# Patient Record
Sex: Female | Born: 1940 | Race: White | Hispanic: No | Smoking: Never smoker
Health system: Southern US, Community
[De-identification: ages and names within clinical notes are randomized; demographics above are authoritative.]

## PROBLEM LIST (undated history)

## (undated) DIAGNOSIS — C719 Malignant neoplasm of brain, unspecified: Secondary | ICD-10-CM

## (undated) DIAGNOSIS — M549 Dorsalgia, unspecified: Secondary | ICD-10-CM

## (undated) DIAGNOSIS — C801 Malignant (primary) neoplasm, unspecified: Secondary | ICD-10-CM

## (undated) DIAGNOSIS — Z85118 Personal history of other malignant neoplasm of bronchus and lung: Secondary | ICD-10-CM

## (undated) DIAGNOSIS — M1990 Unspecified osteoarthritis, unspecified site: Secondary | ICD-10-CM

## (undated) DIAGNOSIS — C3490 Malignant neoplasm of unspecified part of unspecified bronchus or lung: Secondary | ICD-10-CM

## (undated) DIAGNOSIS — R222 Localized swelling, mass and lump, trunk: Secondary | ICD-10-CM

## (undated) DIAGNOSIS — F411 Generalized anxiety disorder: Secondary | ICD-10-CM

## (undated) DIAGNOSIS — F329 Major depressive disorder, single episode, unspecified: Secondary | ICD-10-CM

## (undated) DIAGNOSIS — M810 Age-related osteoporosis without current pathological fracture: Secondary | ICD-10-CM

## (undated) DIAGNOSIS — G629 Polyneuropathy, unspecified: Secondary | ICD-10-CM

## (undated) DIAGNOSIS — G4700 Insomnia, unspecified: Secondary | ICD-10-CM

## (undated) DIAGNOSIS — K5909 Other constipation: Secondary | ICD-10-CM

## (undated) DIAGNOSIS — N390 Urinary tract infection, site not specified: Secondary | ICD-10-CM

## (undated) DIAGNOSIS — R5381 Other malaise: Secondary | ICD-10-CM

## (undated) DIAGNOSIS — I10 Essential (primary) hypertension: Secondary | ICD-10-CM

## (undated) DIAGNOSIS — R5383 Other fatigue: Secondary | ICD-10-CM

## (undated) DIAGNOSIS — E785 Hyperlipidemia, unspecified: Secondary | ICD-10-CM

## (undated) DIAGNOSIS — J309 Allergic rhinitis, unspecified: Secondary | ICD-10-CM

## (undated) HISTORY — DX: Other malaise: R53.81

## (undated) HISTORY — DX: Age-related osteoporosis without current pathological fracture: M81.0

## (undated) HISTORY — DX: Hyperlipidemia, unspecified: E78.5

## (undated) HISTORY — DX: Insomnia, unspecified: G47.00

## (undated) HISTORY — DX: Other fatigue: R53.83

## (undated) HISTORY — DX: Dorsalgia, unspecified: M54.9

## (undated) HISTORY — DX: Malignant neoplasm of brain, unspecified: C71.9

## (undated) HISTORY — DX: Generalized anxiety disorder: F41.1

## (undated) HISTORY — DX: Essential (primary) hypertension: I10

## (undated) HISTORY — DX: Malignant neoplasm of unspecified part of unspecified bronchus or lung: C34.90

## (undated) HISTORY — DX: Allergic rhinitis, unspecified: J30.9

## (undated) HISTORY — DX: Localized swelling, mass and lump, trunk: R22.2

## (undated) HISTORY — DX: Other constipation: K59.09

## (undated) HISTORY — PX: TONSILLECTOMY: SUR1361

## (undated) HISTORY — PX: LOBECTOMY: SHX5089

## (undated) HISTORY — PX: EYE SURGERY: SHX253

## (undated) HISTORY — DX: Urinary tract infection, site not specified: N39.0

## (undated) HISTORY — PX: TUBAL LIGATION: SHX77

## (undated) HISTORY — DX: Personal history of other malignant neoplasm of bronchus and lung: Z85.118

## (undated) HISTORY — DX: Major depressive disorder, single episode, unspecified: F32.9

## (undated) HISTORY — DX: Polyneuropathy, unspecified: G62.9

## (undated) HISTORY — PX: APPENDECTOMY: SHX54

---

## 1998-07-11 ENCOUNTER — Emergency Department (HOSPITAL_COMMUNITY): Admission: EM | Admit: 1998-07-11 | Discharge: 1998-07-11 | Payer: Self-pay | Admitting: Emergency Medicine

## 1998-11-17 ENCOUNTER — Other Ambulatory Visit: Admission: RE | Admit: 1998-11-17 | Discharge: 1998-11-17 | Payer: Self-pay | Admitting: *Deleted

## 1999-07-18 ENCOUNTER — Encounter: Admission: RE | Admit: 1999-07-18 | Discharge: 1999-09-07 | Payer: Self-pay | Admitting: Unknown Physician Specialty

## 1999-10-25 ENCOUNTER — Other Ambulatory Visit: Admission: RE | Admit: 1999-10-25 | Discharge: 1999-10-25 | Payer: Self-pay | Admitting: *Deleted

## 2000-10-22 ENCOUNTER — Other Ambulatory Visit: Admission: RE | Admit: 2000-10-22 | Discharge: 2000-10-22 | Payer: Self-pay | Admitting: *Deleted

## 2003-06-15 ENCOUNTER — Other Ambulatory Visit: Admission: RE | Admit: 2003-06-15 | Discharge: 2003-06-15 | Payer: Self-pay | Admitting: Internal Medicine

## 2004-06-27 ENCOUNTER — Ambulatory Visit: Payer: Self-pay | Admitting: Family Medicine

## 2004-07-07 ENCOUNTER — Ambulatory Visit: Payer: Self-pay | Admitting: *Deleted

## 2004-07-13 ENCOUNTER — Ambulatory Visit (HOSPITAL_COMMUNITY): Admission: RE | Admit: 2004-07-13 | Discharge: 2004-07-13 | Payer: Self-pay | Admitting: Family Medicine

## 2004-07-26 ENCOUNTER — Ambulatory Visit: Payer: Self-pay | Admitting: Family Medicine

## 2004-08-16 ENCOUNTER — Ambulatory Visit: Payer: Self-pay | Admitting: Family Medicine

## 2004-08-22 ENCOUNTER — Ambulatory Visit: Payer: Self-pay | Admitting: Family Medicine

## 2004-08-31 ENCOUNTER — Ambulatory Visit: Payer: Self-pay | Admitting: Family Medicine

## 2004-09-05 ENCOUNTER — Ambulatory Visit: Payer: Self-pay | Admitting: Family Medicine

## 2004-10-12 ENCOUNTER — Ambulatory Visit: Payer: Self-pay | Admitting: Family Medicine

## 2004-10-13 ENCOUNTER — Ambulatory Visit (HOSPITAL_COMMUNITY): Admission: RE | Admit: 2004-10-13 | Discharge: 2004-10-13 | Payer: Self-pay | Admitting: Family Medicine

## 2004-11-11 ENCOUNTER — Ambulatory Visit: Payer: Self-pay | Admitting: Family Medicine

## 2004-11-16 ENCOUNTER — Ambulatory Visit (HOSPITAL_COMMUNITY): Admission: RE | Admit: 2004-11-16 | Discharge: 2004-11-16 | Payer: Self-pay | Admitting: Family Medicine

## 2004-12-06 ENCOUNTER — Ambulatory Visit: Payer: Self-pay | Admitting: Family Medicine

## 2005-07-11 ENCOUNTER — Ambulatory Visit: Payer: Self-pay | Admitting: Family Medicine

## 2005-08-03 ENCOUNTER — Ambulatory Visit: Payer: Self-pay | Admitting: Nurse Practitioner

## 2005-08-30 ENCOUNTER — Ambulatory Visit (HOSPITAL_COMMUNITY): Admission: RE | Admit: 2005-08-30 | Discharge: 2005-08-30 | Payer: Self-pay | Admitting: Family Medicine

## 2006-02-14 ENCOUNTER — Ambulatory Visit: Payer: Self-pay | Admitting: Internal Medicine

## 2006-02-19 ENCOUNTER — Ambulatory Visit: Payer: Self-pay | Admitting: Internal Medicine

## 2006-04-11 ENCOUNTER — Ambulatory Visit: Payer: Self-pay | Admitting: Internal Medicine

## 2006-08-31 ENCOUNTER — Ambulatory Visit (HOSPITAL_COMMUNITY): Admission: RE | Admit: 2006-08-31 | Discharge: 2006-08-31 | Payer: Self-pay | Admitting: Internal Medicine

## 2006-09-11 ENCOUNTER — Encounter: Admission: RE | Admit: 2006-09-11 | Discharge: 2006-09-11 | Payer: Self-pay | Admitting: Internal Medicine

## 2007-03-12 ENCOUNTER — Ambulatory Visit: Payer: Self-pay | Admitting: Internal Medicine

## 2007-03-12 LAB — CONVERTED CEMR LAB
Alkaline Phosphatase: 51 units/L (ref 39–117)
BUN: 15 mg/dL (ref 6–23)
Basophils Absolute: 0 10*3/uL (ref 0.0–0.1)
Bilirubin Urine: NEGATIVE
CO2: 32 meq/L (ref 19–32)
Cholesterol: 235 mg/dL (ref 0–200)
Crystals: NEGATIVE
Eosinophils Absolute: 0.1 10*3/uL (ref 0.0–0.6)
GFR calc Af Amer: 93 mL/min
HDL: 62.2 mg/dL (ref >39.0)
Hemoglobin, Urine: NEGATIVE
Hemoglobin: 13.7 g/dL (ref 12.0–15.0)
Lymphocytes Relative: 42.7 % (ref 12.0–46.0)
MCHC: 34.8 g/dL (ref 30.0–36.0)
Monocytes Absolute: 0.3 10*3/uL (ref 0.2–0.7)
Monocytes Relative: 7 % (ref 3.0–11.0)
Neutro Abs: 2.5 10*3/uL (ref 1.4–7.7)
Potassium: 3.5 meq/L (ref 3.5–5.1)
TSH: 1.26 microintl units/mL (ref 0.35–5.50)
Total Protein: 6.3 g/dL (ref 6.0–8.3)
Triglycerides: 114 mg/dL (ref 0–149)
Urine Glucose: NEGATIVE mg/dL
VLDL: 23 mg/dL (ref 0–40)

## 2007-03-18 ENCOUNTER — Ambulatory Visit: Payer: Self-pay | Admitting: Internal Medicine

## 2007-04-17 ENCOUNTER — Ambulatory Visit: Payer: Self-pay | Admitting: Internal Medicine

## 2007-05-02 ENCOUNTER — Encounter: Payer: Self-pay | Admitting: Internal Medicine

## 2007-05-02 ENCOUNTER — Ambulatory Visit: Payer: Self-pay | Admitting: Internal Medicine

## 2007-08-27 ENCOUNTER — Other Ambulatory Visit: Admission: RE | Admit: 2007-08-27 | Discharge: 2007-08-27 | Payer: Self-pay | Admitting: Gynecology

## 2007-09-16 ENCOUNTER — Ambulatory Visit (HOSPITAL_COMMUNITY): Admission: RE | Admit: 2007-09-16 | Discharge: 2007-09-16 | Payer: Self-pay | Admitting: Internal Medicine

## 2007-10-29 ENCOUNTER — Observation Stay (HOSPITAL_COMMUNITY): Admission: EM | Admit: 2007-10-29 | Discharge: 2007-10-30 | Payer: Self-pay | Admitting: Emergency Medicine

## 2007-10-29 ENCOUNTER — Ambulatory Visit: Payer: Self-pay | Admitting: Internal Medicine

## 2007-10-30 ENCOUNTER — Encounter: Payer: Self-pay | Admitting: Internal Medicine

## 2007-11-04 ENCOUNTER — Emergency Department (HOSPITAL_COMMUNITY): Admission: EM | Admit: 2007-11-04 | Discharge: 2007-11-05 | Payer: Self-pay | Admitting: Emergency Medicine

## 2007-11-05 ENCOUNTER — Ambulatory Visit: Payer: Self-pay | Admitting: Internal Medicine

## 2007-11-05 ENCOUNTER — Telehealth (INDEPENDENT_AMBULATORY_CARE_PROVIDER_SITE_OTHER): Payer: Self-pay | Admitting: *Deleted

## 2007-11-05 DIAGNOSIS — E785 Hyperlipidemia, unspecified: Secondary | ICD-10-CM

## 2007-11-05 DIAGNOSIS — I10 Essential (primary) hypertension: Secondary | ICD-10-CM

## 2007-11-05 DIAGNOSIS — M810 Age-related osteoporosis without current pathological fracture: Secondary | ICD-10-CM

## 2007-11-05 DIAGNOSIS — J309 Allergic rhinitis, unspecified: Secondary | ICD-10-CM | POA: Insufficient documentation

## 2007-11-05 DIAGNOSIS — R222 Localized swelling, mass and lump, trunk: Secondary | ICD-10-CM

## 2007-11-05 HISTORY — DX: Hyperlipidemia, unspecified: E78.5

## 2007-11-05 HISTORY — DX: Essential (primary) hypertension: I10

## 2007-11-05 HISTORY — DX: Localized swelling, mass and lump, trunk: R22.2

## 2007-11-05 HISTORY — DX: Age-related osteoporosis without current pathological fracture: M81.0

## 2007-11-05 HISTORY — DX: Allergic rhinitis, unspecified: J30.9

## 2007-11-07 ENCOUNTER — Ambulatory Visit: Payer: Self-pay | Admitting: Critical Care Medicine

## 2007-11-08 ENCOUNTER — Ambulatory Visit: Payer: Self-pay | Admitting: Critical Care Medicine

## 2007-11-12 ENCOUNTER — Encounter: Payer: Self-pay | Admitting: Critical Care Medicine

## 2007-11-13 ENCOUNTER — Encounter: Payer: Self-pay | Admitting: Critical Care Medicine

## 2007-11-13 ENCOUNTER — Ambulatory Visit: Admission: RE | Admit: 2007-11-13 | Discharge: 2007-11-13 | Payer: Self-pay | Admitting: Critical Care Medicine

## 2007-11-13 ENCOUNTER — Ambulatory Visit: Payer: Self-pay | Admitting: Critical Care Medicine

## 2007-11-14 ENCOUNTER — Telehealth (INDEPENDENT_AMBULATORY_CARE_PROVIDER_SITE_OTHER): Payer: Self-pay | Admitting: *Deleted

## 2007-11-15 ENCOUNTER — Encounter: Payer: Self-pay | Admitting: Critical Care Medicine

## 2007-11-15 ENCOUNTER — Telehealth: Payer: Self-pay | Admitting: Critical Care Medicine

## 2007-11-15 ENCOUNTER — Ambulatory Visit (HOSPITAL_COMMUNITY): Admission: RE | Admit: 2007-11-15 | Discharge: 2007-11-15 | Payer: Self-pay | Admitting: Critical Care Medicine

## 2007-11-18 ENCOUNTER — Telehealth: Payer: Self-pay | Admitting: Critical Care Medicine

## 2007-11-18 ENCOUNTER — Telehealth (INDEPENDENT_AMBULATORY_CARE_PROVIDER_SITE_OTHER): Payer: Self-pay | Admitting: *Deleted

## 2007-11-22 ENCOUNTER — Ambulatory Visit: Payer: Self-pay | Admitting: Thoracic Surgery (Cardiothoracic Vascular Surgery)

## 2007-11-22 ENCOUNTER — Telehealth (INDEPENDENT_AMBULATORY_CARE_PROVIDER_SITE_OTHER): Payer: Self-pay | Admitting: *Deleted

## 2007-11-22 ENCOUNTER — Encounter: Payer: Self-pay | Admitting: Internal Medicine

## 2007-11-29 ENCOUNTER — Ambulatory Visit: Payer: Self-pay

## 2007-11-29 ENCOUNTER — Encounter: Payer: Self-pay | Admitting: Internal Medicine

## 2007-12-01 HISTORY — PX: OTHER SURGICAL HISTORY: SHX169

## 2007-12-03 ENCOUNTER — Encounter: Payer: Self-pay | Admitting: Critical Care Medicine

## 2007-12-09 ENCOUNTER — Ambulatory Visit: Payer: Self-pay | Admitting: Thoracic Surgery (Cardiothoracic Vascular Surgery)

## 2007-12-10 ENCOUNTER — Ambulatory Visit: Payer: Self-pay | Admitting: Thoracic Surgery (Cardiothoracic Vascular Surgery)

## 2007-12-10 ENCOUNTER — Encounter: Payer: Self-pay | Admitting: Thoracic Surgery (Cardiothoracic Vascular Surgery)

## 2007-12-10 ENCOUNTER — Inpatient Hospital Stay (HOSPITAL_COMMUNITY)
Admission: RE | Admit: 2007-12-10 | Discharge: 2007-12-16 | Payer: Self-pay | Admitting: Thoracic Surgery (Cardiothoracic Vascular Surgery)

## 2007-12-10 ENCOUNTER — Encounter: Payer: Self-pay | Admitting: Critical Care Medicine

## 2007-12-16 ENCOUNTER — Ambulatory Visit: Payer: Self-pay | Admitting: Internal Medicine

## 2007-12-19 ENCOUNTER — Telehealth (INDEPENDENT_AMBULATORY_CARE_PROVIDER_SITE_OTHER): Payer: Self-pay | Admitting: *Deleted

## 2007-12-22 ENCOUNTER — Emergency Department (HOSPITAL_COMMUNITY): Admission: EM | Admit: 2007-12-22 | Discharge: 2007-12-22 | Payer: Self-pay | Admitting: Emergency Medicine

## 2007-12-26 ENCOUNTER — Encounter: Admission: RE | Admit: 2007-12-26 | Discharge: 2007-12-26 | Payer: Self-pay | Admitting: Cardiothoracic Surgery

## 2007-12-26 ENCOUNTER — Ambulatory Visit: Payer: Self-pay | Admitting: Cardiothoracic Surgery

## 2008-01-02 ENCOUNTER — Encounter: Payer: Self-pay | Admitting: Internal Medicine

## 2008-01-02 ENCOUNTER — Encounter: Payer: Self-pay | Admitting: Critical Care Medicine

## 2008-01-02 LAB — COMPREHENSIVE METABOLIC PANEL
ALT: 30 U/L (ref 0–35)
AST: 18 U/L (ref 0–37)
Alkaline Phosphatase: 48 U/L (ref 39–117)
Calcium: 9.7 mg/dL (ref 8.4–10.5)
Chloride: 98 mEq/L (ref 96–112)
Creatinine, Ser: 0.79 mg/dL (ref 0.40–1.20)
Total Bilirubin: 0.5 mg/dL (ref 0.3–1.2)

## 2008-01-02 LAB — CBC WITH DIFFERENTIAL/PLATELET
BASO%: 0.7 % (ref 0.0–2.0)
Basophils Absolute: 0 10*3/uL (ref 0.0–0.1)
EOS%: 1.3 % (ref 0.0–7.0)
HCT: 34.3 % — ABNORMAL LOW (ref 34.8–46.6)
HGB: 12 g/dL (ref 11.6–15.9)
MCH: 31.6 pg (ref 26.0–34.0)
MCHC: 34.9 g/dL (ref 32.0–36.0)
MCV: 90.6 fL (ref 81.0–101.0)
MONO%: 8.2 % (ref 0.0–13.0)
NEUT%: 53.2 % (ref 39.6–76.8)

## 2008-01-21 ENCOUNTER — Encounter: Payer: Self-pay | Admitting: Internal Medicine

## 2008-01-21 ENCOUNTER — Ambulatory Visit: Payer: Self-pay | Admitting: Thoracic Surgery (Cardiothoracic Vascular Surgery)

## 2008-01-21 LAB — CBC WITH DIFFERENTIAL/PLATELET
BASO%: 0.8 % (ref 0.0–2.0)
Basophils Absolute: 0.1 10*3/uL (ref 0.0–0.1)
EOS%: 0.3 % (ref 0.0–7.0)
HCT: 36.3 % (ref 34.8–46.6)
HGB: 12.7 g/dL (ref 11.6–15.9)
LYMPH%: 19.8 % (ref 14.0–48.0)
MCH: 31.7 pg (ref 26.0–34.0)
MCHC: 35.1 g/dL (ref 32.0–36.0)
MCV: 90.2 fL (ref 81.0–101.0)
MONO%: 5.7 % (ref 0.0–13.0)
NEUT%: 73.4 % (ref 39.6–76.8)
Platelets: 276 10*3/uL (ref 145–400)

## 2008-01-21 LAB — MAGNESIUM: Magnesium: 2.4 mg/dL (ref 1.5–2.5)

## 2008-01-21 LAB — COMPREHENSIVE METABOLIC PANEL
ALT: 14 U/L (ref 0–35)
AST: 12 U/L (ref 0–37)
BUN: 18 mg/dL (ref 6–23)
Creatinine, Ser: 0.68 mg/dL (ref 0.40–1.20)
Total Bilirubin: 0.3 mg/dL (ref 0.3–1.2)

## 2008-01-24 ENCOUNTER — Ambulatory Visit: Payer: Self-pay | Admitting: Internal Medicine

## 2008-01-28 LAB — CBC WITH DIFFERENTIAL/PLATELET
Basophils Absolute: 0 10*3/uL (ref 0.0–0.1)
EOS%: 0.6 % (ref 0.0–7.0)
Eosinophils Absolute: 0 10*3/uL (ref 0.0–0.5)
HCT: 38.1 % (ref 34.8–46.6)
HGB: 13.4 g/dL (ref 11.6–15.9)
MCH: 31.3 pg (ref 26.0–34.0)
MCV: 89.2 fL (ref 81.0–101.0)
NEUT#: 1.3 10*3/uL — ABNORMAL LOW (ref 1.5–6.5)
NEUT%: 27.8 % — ABNORMAL LOW (ref 39.6–76.8)
RDW: 11.6 % (ref 11.3–14.5)
lymph#: 2.3 10*3/uL (ref 0.9–3.3)

## 2008-01-28 LAB — COMPREHENSIVE METABOLIC PANEL
AST: 13 U/L (ref 0–37)
Albumin: 4.6 g/dL (ref 3.5–5.2)
BUN: 15 mg/dL (ref 6–23)
Calcium: 9.9 mg/dL (ref 8.4–10.5)
Chloride: 93 mEq/L — ABNORMAL LOW (ref 96–112)
Creatinine, Ser: 0.69 mg/dL (ref 0.40–1.20)
Glucose, Bld: 103 mg/dL — ABNORMAL HIGH (ref 70–99)
Potassium: 3.5 mEq/L (ref 3.5–5.3)

## 2008-01-28 LAB — MAGNESIUM: Magnesium: 1.7 mg/dL (ref 1.5–2.5)

## 2008-02-04 LAB — COMPREHENSIVE METABOLIC PANEL
ALT: 32 U/L (ref 0–35)
AST: 19 U/L (ref 0–37)
Alkaline Phosphatase: 60 U/L (ref 39–117)
Creatinine, Ser: 0.72 mg/dL (ref 0.40–1.20)
Sodium: 141 mEq/L (ref 135–145)
Total Bilirubin: 0.2 mg/dL — ABNORMAL LOW (ref 0.3–1.2)
Total Protein: 6.1 g/dL (ref 6.0–8.3)

## 2008-02-04 LAB — CBC WITH DIFFERENTIAL/PLATELET
BASO%: 0.2 % (ref 0.0–2.0)
EOS%: 0 % (ref 0.0–7.0)
HCT: 34.1 % — ABNORMAL LOW (ref 34.8–46.6)
LYMPH%: 30.4 % (ref 14.0–48.0)
MCH: 31.4 pg (ref 26.0–34.0)
MCHC: 34.8 g/dL (ref 32.0–36.0)
MONO%: 4.9 % (ref 0.0–13.0)
NEUT%: 64.5 % (ref 39.6–76.8)
Platelets: 247 10*3/uL (ref 145–400)
RBC: 3.78 10*6/uL (ref 3.70–5.32)

## 2008-02-04 LAB — MAGNESIUM: Magnesium: 2.3 mg/dL (ref 1.5–2.5)

## 2008-02-11 ENCOUNTER — Encounter: Payer: Self-pay | Admitting: Critical Care Medicine

## 2008-02-11 ENCOUNTER — Encounter: Payer: Self-pay | Admitting: Internal Medicine

## 2008-02-11 LAB — CBC WITH DIFFERENTIAL/PLATELET
BASO%: 0.5 % (ref 0.0–2.0)
EOS%: 0.3 % (ref 0.0–7.0)
MCH: 31.5 pg (ref 26.0–34.0)
MCHC: 35 g/dL (ref 32.0–36.0)
MONO#: 0.2 10*3/uL (ref 0.1–0.9)
NEUT%: 83.7 % — ABNORMAL HIGH (ref 39.6–76.8)
RBC: 3.66 10*6/uL — ABNORMAL LOW (ref 3.70–5.32)
WBC: 8.2 10*3/uL (ref 3.9–10.0)
lymph#: 1.1 10*3/uL (ref 0.9–3.3)

## 2008-02-11 LAB — COMPREHENSIVE METABOLIC PANEL
ALT: 24 U/L (ref 0–35)
AST: 18 U/L (ref 0–37)
CO2: 24 mEq/L (ref 19–32)
Calcium: 9.1 mg/dL (ref 8.4–10.5)
Chloride: 103 mEq/L (ref 96–112)
Creatinine, Ser: 0.68 mg/dL (ref 0.40–1.20)
Sodium: 140 mEq/L (ref 135–145)
Total Bilirubin: 0.3 mg/dL (ref 0.3–1.2)
Total Protein: 6.6 g/dL (ref 6.0–8.3)

## 2008-02-11 LAB — MAGNESIUM: Magnesium: 2 mg/dL (ref 1.5–2.5)

## 2008-02-17 ENCOUNTER — Ambulatory Visit (HOSPITAL_COMMUNITY): Admission: RE | Admit: 2008-02-17 | Discharge: 2008-02-17 | Payer: Self-pay | Admitting: Internal Medicine

## 2008-03-03 ENCOUNTER — Encounter: Payer: Self-pay | Admitting: Internal Medicine

## 2008-03-03 LAB — COMPREHENSIVE METABOLIC PANEL
ALT: 26 U/L (ref 0–35)
AST: 19 U/L (ref 0–37)
Alkaline Phosphatase: 48 U/L (ref 39–117)
BUN: 14 mg/dL (ref 6–23)
CO2: 25 mEq/L (ref 19–32)
Chloride: 103 mEq/L (ref 96–112)
Creatinine, Ser: 0.7 mg/dL (ref 0.40–1.20)
Glucose, Bld: 165 mg/dL — ABNORMAL HIGH (ref 70–99)
Potassium: 4.1 mEq/L (ref 3.5–5.3)
Sodium: 140 mEq/L (ref 135–145)
Total Bilirubin: 0.5 mg/dL (ref 0.3–1.2)

## 2008-03-03 LAB — CBC WITH DIFFERENTIAL/PLATELET
Eosinophils Absolute: 0 10*3/uL (ref 0.0–0.5)
LYMPH%: 4.5 % — ABNORMAL LOW (ref 14.0–48.0)
MONO#: 0 10*3/uL — ABNORMAL LOW (ref 0.1–0.9)
NEUT#: 9.5 10*3/uL — ABNORMAL HIGH (ref 1.5–6.5)
Platelets: 288 10*3/uL (ref 145–400)
RBC: 3.7 10*6/uL (ref 3.70–5.32)
RDW: 14.1 % (ref 11.3–14.5)
WBC: 10 10*3/uL (ref 3.9–10.0)
lymph#: 0.5 10*3/uL — ABNORMAL LOW (ref 0.9–3.3)

## 2008-03-06 ENCOUNTER — Ambulatory Visit: Payer: Self-pay | Admitting: Internal Medicine

## 2008-03-10 LAB — CBC WITH DIFFERENTIAL/PLATELET
BASO%: 0.4 % (ref 0.0–2.0)
EOS%: 0.4 % (ref 0.0–7.0)
HCT: 33.3 % — ABNORMAL LOW (ref 34.8–46.6)
LYMPH%: 42.5 % (ref 14.0–48.0)
MCH: 31.3 pg (ref 26.0–34.0)
MCHC: 34.7 g/dL (ref 32.0–36.0)
MONO#: 0.5 10*3/uL (ref 0.1–0.9)
NEUT%: 44.9 % (ref 39.6–76.8)
Platelets: 147 10*3/uL (ref 145–400)
RBC: 3.7 10*6/uL (ref 3.70–5.32)
WBC: 4.4 10*3/uL (ref 3.9–10.0)

## 2008-03-10 LAB — COMPREHENSIVE METABOLIC PANEL
ALT: 15 U/L (ref 0–35)
AST: 10 U/L (ref 0–37)
Alkaline Phosphatase: 68 U/L (ref 39–117)
Creatinine, Ser: 0.7 mg/dL (ref 0.40–1.20)
Sodium: 137 mEq/L (ref 135–145)
Total Bilirubin: 0.6 mg/dL (ref 0.3–1.2)
Total Protein: 6.3 g/dL (ref 6.0–8.3)

## 2008-03-17 LAB — CBC WITH DIFFERENTIAL/PLATELET
BASO%: 0.3 % (ref 0.0–2.0)
Basophils Absolute: 0 10*3/uL (ref 0.0–0.1)
EOS%: 0 % (ref 0.0–7.0)
HCT: 32 % — ABNORMAL LOW (ref 34.8–46.6)
HGB: 11.3 g/dL — ABNORMAL LOW (ref 11.6–15.9)
LYMPH%: 24.6 % (ref 14.0–48.0)
MCH: 31.9 pg (ref 26.0–34.0)
MCHC: 35.4 g/dL (ref 32.0–36.0)
MCV: 90 fL (ref 81.0–101.0)
MONO%: 4.8 % (ref 0.0–13.0)
NEUT%: 70.3 % (ref 39.6–76.8)
Platelets: 209 10*3/uL (ref 145–400)
lymph#: 2.3 10*3/uL (ref 0.9–3.3)

## 2008-03-17 LAB — COMPREHENSIVE METABOLIC PANEL
AST: 15 U/L (ref 0–37)
BUN: 15 mg/dL (ref 6–23)
Calcium: 9.1 mg/dL (ref 8.4–10.5)
Chloride: 100 mEq/L (ref 96–112)
Creatinine, Ser: 0.72 mg/dL (ref 0.40–1.20)
Total Bilirubin: 0.3 mg/dL (ref 0.3–1.2)

## 2008-03-17 LAB — MAGNESIUM: Magnesium: 2 mg/dL (ref 1.5–2.5)

## 2008-03-24 ENCOUNTER — Encounter: Payer: Self-pay | Admitting: Internal Medicine

## 2008-03-24 ENCOUNTER — Encounter: Payer: Self-pay | Admitting: Critical Care Medicine

## 2008-03-27 ENCOUNTER — Telehealth (INDEPENDENT_AMBULATORY_CARE_PROVIDER_SITE_OTHER): Payer: Self-pay | Admitting: *Deleted

## 2008-03-31 LAB — COMPREHENSIVE METABOLIC PANEL
ALT: 16 U/L (ref 0–35)
Albumin: 4.4 g/dL (ref 3.5–5.2)
Alkaline Phosphatase: 70 U/L (ref 39–117)
CO2: 25 mEq/L (ref 19–32)
Glucose, Bld: 108 mg/dL — ABNORMAL HIGH (ref 70–99)
Potassium: 4 mEq/L (ref 3.5–5.3)
Sodium: 137 mEq/L (ref 135–145)
Total Bilirubin: 0.6 mg/dL (ref 0.3–1.2)
Total Protein: 6.3 g/dL (ref 6.0–8.3)

## 2008-03-31 LAB — CBC WITH DIFFERENTIAL/PLATELET
BASO%: 0 % (ref 0.0–2.0)
Eosinophils Absolute: 0 10*3/uL (ref 0.0–0.5)
LYMPH%: 41.2 % (ref 14.0–48.0)
MCHC: 35.5 g/dL (ref 32.0–36.0)
MONO#: 0.3 10*3/uL (ref 0.1–0.9)
MONO%: 7.7 % (ref 0.0–13.0)
NEUT#: 2.1 10*3/uL (ref 1.5–6.5)
Platelets: 171 10*3/uL (ref 145–400)
RBC: 3.58 10*6/uL — ABNORMAL LOW (ref 3.70–5.32)
RDW: 16.4 % — ABNORMAL HIGH (ref 11.3–14.5)
WBC: 4.2 10*3/uL (ref 3.9–10.0)

## 2008-04-10 ENCOUNTER — Ambulatory Visit: Payer: Self-pay | Admitting: Thoracic Surgery (Cardiothoracic Vascular Surgery)

## 2008-04-10 ENCOUNTER — Encounter
Admission: RE | Admit: 2008-04-10 | Discharge: 2008-04-10 | Payer: Self-pay | Admitting: Thoracic Surgery (Cardiothoracic Vascular Surgery)

## 2008-04-20 ENCOUNTER — Encounter: Admission: RE | Admit: 2008-04-20 | Discharge: 2008-04-20 | Payer: Self-pay | Admitting: Internal Medicine

## 2008-04-21 ENCOUNTER — Ambulatory Visit: Payer: Self-pay | Admitting: Internal Medicine

## 2008-04-23 ENCOUNTER — Encounter: Payer: Self-pay | Admitting: Critical Care Medicine

## 2008-04-23 LAB — CBC WITH DIFFERENTIAL/PLATELET
Basophils Absolute: 0 10*3/uL (ref 0.0–0.1)
Eosinophils Absolute: 0 10*3/uL (ref 0.0–0.5)
HGB: 10.6 g/dL — ABNORMAL LOW (ref 11.6–15.9)
LYMPH%: 26.4 % (ref 14.0–48.0)
MCV: 94.7 fL (ref 81.0–101.0)
MONO%: 8.5 % (ref 0.0–13.0)
NEUT#: 3.1 10*3/uL (ref 1.5–6.5)
Platelets: 292 10*3/uL (ref 145–400)
RDW: 16.5 % — ABNORMAL HIGH (ref 11.3–14.5)

## 2008-04-23 LAB — COMPREHENSIVE METABOLIC PANEL
Albumin: 4.3 g/dL (ref 3.5–5.2)
Alkaline Phosphatase: 47 U/L (ref 39–117)
BUN: 12 mg/dL (ref 6–23)
Glucose, Bld: 99 mg/dL (ref 70–99)
Potassium: 4.1 mEq/L (ref 3.5–5.3)

## 2008-04-23 LAB — MAGNESIUM: Magnesium: 1.9 mg/dL (ref 1.5–2.5)

## 2008-05-04 ENCOUNTER — Ambulatory Visit: Payer: Self-pay | Admitting: Internal Medicine

## 2008-05-05 LAB — CONVERTED CEMR LAB
AST: 21 units/L (ref 0–37)
Albumin: 3.7 g/dL (ref 3.5–5.2)
BUN: 14 mg/dL (ref 6–23)
Basophils Relative: 1.8 % (ref 0.0–3.0)
Cholesterol: 183 mg/dL (ref 0–200)
Creatinine, Ser: 0.7 mg/dL (ref 0.4–1.2)
Crystals: NEGATIVE
Eosinophils Absolute: 0.1 10*3/uL (ref 0.0–0.7)
Eosinophils Relative: 1.8 % (ref 0.0–5.0)
GFR calc Af Amer: 108 mL/min
GFR calc non Af Amer: 90 mL/min
Glucose, Bld: 83 mg/dL (ref 70–99)
HCT: 32.1 % — ABNORMAL LOW (ref 36.0–46.0)
Hemoglobin: 10.9 g/dL — ABNORMAL LOW (ref 12.0–15.0)
MCV: 99.7 fL (ref 78.0–100.0)
Monocytes Absolute: 0.4 10*3/uL (ref 0.1–1.0)
Neutro Abs: 1.8 10*3/uL (ref 1.4–7.7)
RBC: 3.22 M/uL — ABNORMAL LOW (ref 3.87–5.11)
Specific Gravity, Urine: 1.015 (ref 1.000–1.03)
TSH: 0.7 microintl units/mL (ref 0.35–5.50)
Total Protein, Urine: NEGATIVE mg/dL
Total Protein: 5.7 g/dL — ABNORMAL LOW (ref 6.0–8.3)
Urine Glucose: NEGATIVE mg/dL
VLDL: 25 mg/dL (ref 0–40)
WBC: 4.1 10*3/uL — ABNORMAL LOW (ref 4.5–10.5)

## 2008-05-06 ENCOUNTER — Ambulatory Visit: Payer: Self-pay | Admitting: Internal Medicine

## 2008-05-06 ENCOUNTER — Telehealth (INDEPENDENT_AMBULATORY_CARE_PROVIDER_SITE_OTHER): Payer: Self-pay | Admitting: *Deleted

## 2008-05-06 DIAGNOSIS — Z85118 Personal history of other malignant neoplasm of bronchus and lung: Secondary | ICD-10-CM

## 2008-05-06 DIAGNOSIS — N390 Urinary tract infection, site not specified: Secondary | ICD-10-CM

## 2008-05-06 DIAGNOSIS — F329 Major depressive disorder, single episode, unspecified: Secondary | ICD-10-CM

## 2008-05-06 DIAGNOSIS — F3289 Other specified depressive episodes: Secondary | ICD-10-CM

## 2008-05-06 HISTORY — DX: Major depressive disorder, single episode, unspecified: F32.9

## 2008-05-06 HISTORY — DX: Personal history of other malignant neoplasm of bronchus and lung: Z85.118

## 2008-05-06 HISTORY — DX: Other specified depressive episodes: F32.89

## 2008-05-06 HISTORY — DX: Urinary tract infection, site not specified: N39.0

## 2008-05-06 LAB — CONVERTED CEMR LAB
Ketones, urine, test strip: NEGATIVE
Nitrite: NEGATIVE
Specific Gravity, Urine: 1.015

## 2008-05-11 ENCOUNTER — Telehealth (INDEPENDENT_AMBULATORY_CARE_PROVIDER_SITE_OTHER): Payer: Self-pay | Admitting: *Deleted

## 2008-05-11 ENCOUNTER — Ambulatory Visit: Payer: Self-pay | Admitting: Internal Medicine

## 2008-05-11 DIAGNOSIS — M549 Dorsalgia, unspecified: Secondary | ICD-10-CM

## 2008-05-11 HISTORY — DX: Dorsalgia, unspecified: M54.9

## 2008-05-12 ENCOUNTER — Ambulatory Visit: Payer: Self-pay | Admitting: Internal Medicine

## 2008-05-13 ENCOUNTER — Telehealth (INDEPENDENT_AMBULATORY_CARE_PROVIDER_SITE_OTHER): Payer: Self-pay | Admitting: *Deleted

## 2008-05-18 ENCOUNTER — Telehealth (INDEPENDENT_AMBULATORY_CARE_PROVIDER_SITE_OTHER): Payer: Self-pay | Admitting: *Deleted

## 2008-05-20 ENCOUNTER — Ambulatory Visit: Payer: Self-pay | Admitting: Internal Medicine

## 2008-05-20 ENCOUNTER — Encounter: Payer: Self-pay | Admitting: Internal Medicine

## 2008-05-25 ENCOUNTER — Telehealth (INDEPENDENT_AMBULATORY_CARE_PROVIDER_SITE_OTHER): Payer: Self-pay | Admitting: *Deleted

## 2008-05-29 ENCOUNTER — Encounter: Payer: Self-pay | Admitting: Internal Medicine

## 2008-06-01 ENCOUNTER — Encounter: Payer: Self-pay | Admitting: Internal Medicine

## 2008-06-01 ENCOUNTER — Telehealth (INDEPENDENT_AMBULATORY_CARE_PROVIDER_SITE_OTHER): Payer: Self-pay | Admitting: *Deleted

## 2008-06-17 ENCOUNTER — Encounter: Admission: RE | Admit: 2008-06-17 | Discharge: 2008-06-17 | Payer: Self-pay | Admitting: Family Medicine

## 2008-06-19 ENCOUNTER — Encounter: Payer: Self-pay | Admitting: Internal Medicine

## 2008-06-26 ENCOUNTER — Emergency Department (HOSPITAL_COMMUNITY): Admission: EM | Admit: 2008-06-26 | Discharge: 2008-06-27 | Payer: Self-pay | Admitting: Emergency Medicine

## 2008-07-06 ENCOUNTER — Encounter: Payer: Self-pay | Admitting: Internal Medicine

## 2008-07-07 ENCOUNTER — Encounter: Payer: Self-pay | Admitting: Internal Medicine

## 2008-07-09 ENCOUNTER — Encounter: Payer: Self-pay | Admitting: Internal Medicine

## 2008-07-13 ENCOUNTER — Telehealth (INDEPENDENT_AMBULATORY_CARE_PROVIDER_SITE_OTHER): Payer: Self-pay | Admitting: *Deleted

## 2008-07-15 ENCOUNTER — Telehealth (INDEPENDENT_AMBULATORY_CARE_PROVIDER_SITE_OTHER): Payer: Self-pay | Admitting: *Deleted

## 2008-07-17 ENCOUNTER — Ambulatory Visit: Payer: Self-pay | Admitting: Internal Medicine

## 2008-07-20 ENCOUNTER — Encounter: Payer: Self-pay | Admitting: Internal Medicine

## 2008-07-21 LAB — COMPREHENSIVE METABOLIC PANEL
ALT: 15 U/L (ref 0–35)
AST: 13 U/L (ref 0–37)
Albumin: 4.5 g/dL (ref 3.5–5.2)
Calcium: 9.7 mg/dL (ref 8.4–10.5)
Chloride: 101 mEq/L (ref 96–112)
Creatinine, Ser: 0.85 mg/dL (ref 0.40–1.20)
Potassium: 3.8 mEq/L (ref 3.5–5.3)

## 2008-07-21 LAB — CBC WITH DIFFERENTIAL/PLATELET
BASO%: 0.7 % (ref 0.0–2.0)
EOS%: 0.9 % (ref 0.0–7.0)
MCH: 32.3 pg (ref 26.0–34.0)
MCHC: 34.8 g/dL (ref 32.0–36.0)
RDW: 12.1 % (ref 11.3–14.5)
lymph#: 2.3 10*3/uL (ref 0.9–3.3)

## 2008-07-28 ENCOUNTER — Telehealth (INDEPENDENT_AMBULATORY_CARE_PROVIDER_SITE_OTHER): Payer: Self-pay | Admitting: *Deleted

## 2008-07-28 ENCOUNTER — Encounter: Payer: Self-pay | Admitting: Internal Medicine

## 2008-08-18 ENCOUNTER — Encounter: Payer: Self-pay | Admitting: Internal Medicine

## 2008-08-31 ENCOUNTER — Encounter
Admission: RE | Admit: 2008-08-31 | Discharge: 2008-08-31 | Payer: Self-pay | Admitting: Thoracic Surgery (Cardiothoracic Vascular Surgery)

## 2008-08-31 ENCOUNTER — Ambulatory Visit: Payer: Self-pay | Admitting: Thoracic Surgery (Cardiothoracic Vascular Surgery)

## 2008-09-16 ENCOUNTER — Ambulatory Visit (HOSPITAL_COMMUNITY): Admission: RE | Admit: 2008-09-16 | Discharge: 2008-09-16 | Payer: Self-pay | Admitting: Internal Medicine

## 2008-09-17 ENCOUNTER — Telehealth (INDEPENDENT_AMBULATORY_CARE_PROVIDER_SITE_OTHER): Payer: Self-pay | Admitting: *Deleted

## 2008-09-22 ENCOUNTER — Telehealth (INDEPENDENT_AMBULATORY_CARE_PROVIDER_SITE_OTHER): Payer: Self-pay | Admitting: *Deleted

## 2008-09-28 ENCOUNTER — Encounter: Payer: Self-pay | Admitting: Internal Medicine

## 2008-09-29 ENCOUNTER — Encounter: Admission: RE | Admit: 2008-09-29 | Discharge: 2008-09-29 | Payer: Self-pay | Admitting: Internal Medicine

## 2008-10-15 ENCOUNTER — Telehealth: Payer: Self-pay | Admitting: Internal Medicine

## 2008-10-20 ENCOUNTER — Ambulatory Visit: Payer: Self-pay | Admitting: Internal Medicine

## 2008-10-22 ENCOUNTER — Ambulatory Visit: Payer: Self-pay | Admitting: Internal Medicine

## 2008-10-22 LAB — COMPREHENSIVE METABOLIC PANEL
ALT: 17 U/L (ref 0–35)
AST: 16 U/L (ref 0–37)
Albumin: 4.8 g/dL (ref 3.5–5.2)
Alkaline Phosphatase: 64 U/L (ref 39–117)
BUN: 16 mg/dL (ref 6–23)
CO2: 29 mEq/L (ref 19–32)
Calcium: 9.9 mg/dL (ref 8.4–10.5)
Chloride: 100 mEq/L (ref 96–112)
Creatinine, Ser: 1 mg/dL (ref 0.40–1.20)
Glucose, Bld: 85 mg/dL (ref 70–99)
Potassium: 3.9 mEq/L (ref 3.5–5.3)
Sodium: 140 mEq/L (ref 135–145)
Total Bilirubin: 0.4 mg/dL (ref 0.3–1.2)
Total Protein: 7.2 g/dL (ref 6.0–8.3)

## 2008-10-22 LAB — CBC WITH DIFFERENTIAL/PLATELET
BASO%: 0.4 % (ref 0.0–2.0)
Basophils Absolute: 0 10*3/uL (ref 0.0–0.1)
EOS%: 0.9 % (ref 0.0–7.0)
HGB: 13.3 g/dL (ref 11.6–15.9)
MCH: 32.9 pg (ref 26.0–34.0)
MCHC: 34.8 g/dL (ref 32.0–36.0)
RDW: 12.2 % (ref 11.3–14.5)
WBC: 5.5 10*3/uL (ref 3.9–10.0)
lymph#: 2.2 10*3/uL (ref 0.9–3.3)

## 2008-10-22 LAB — CONVERTED CEMR LAB: Folate: >20 ng/mL

## 2008-10-23 ENCOUNTER — Encounter: Admission: RE | Admit: 2008-10-23 | Discharge: 2008-10-23 | Payer: Self-pay | Admitting: Internal Medicine

## 2008-10-28 ENCOUNTER — Encounter: Payer: Self-pay | Admitting: Critical Care Medicine

## 2008-12-09 ENCOUNTER — Encounter: Payer: Self-pay | Admitting: Internal Medicine

## 2008-12-10 ENCOUNTER — Encounter: Payer: Self-pay | Admitting: Internal Medicine

## 2008-12-10 ENCOUNTER — Ambulatory Visit: Payer: Self-pay | Admitting: Thoracic Surgery (Cardiothoracic Vascular Surgery)

## 2009-02-15 ENCOUNTER — Ambulatory Visit: Payer: Self-pay | Admitting: Internal Medicine

## 2009-02-17 LAB — CBC WITH DIFFERENTIAL/PLATELET
BASO%: 0.3 % (ref 0.0–2.0)
EOS%: 1.6 % (ref 0.0–7.0)
Eosinophils Absolute: 0.1 10*3/uL (ref 0.0–0.5)
MCV: 94.2 fL (ref 79.5–101.0)
MONO%: 8 % (ref 0.0–14.0)
NEUT#: 4.4 10*3/uL (ref 1.5–6.5)
RBC: 4.13 10*6/uL (ref 3.70–5.45)
RDW: 12.6 % (ref 11.2–14.5)

## 2009-02-17 LAB — COMPREHENSIVE METABOLIC PANEL
AST: 22 U/L (ref 0–37)
Albumin: 4.3 g/dL (ref 3.5–5.2)
BUN: 7 mg/dL (ref 6–23)
CO2: 32 mEq/L (ref 19–32)
Calcium: 9.4 mg/dL (ref 8.4–10.5)
Chloride: 99 mEq/L (ref 96–112)
Potassium: 3.9 mEq/L (ref 3.5–5.3)

## 2009-02-18 ENCOUNTER — Encounter: Admission: RE | Admit: 2009-02-18 | Discharge: 2009-02-18 | Payer: Self-pay | Admitting: Internal Medicine

## 2009-02-24 ENCOUNTER — Encounter: Payer: Self-pay | Admitting: Internal Medicine

## 2009-04-06 ENCOUNTER — Telehealth: Payer: Self-pay | Admitting: Internal Medicine

## 2009-04-22 ENCOUNTER — Encounter
Admission: RE | Admit: 2009-04-22 | Discharge: 2009-04-22 | Payer: Self-pay | Admitting: Thoracic Surgery (Cardiothoracic Vascular Surgery)

## 2009-04-22 ENCOUNTER — Ambulatory Visit: Payer: Self-pay | Admitting: Thoracic Surgery (Cardiothoracic Vascular Surgery)

## 2009-04-22 ENCOUNTER — Encounter: Payer: Self-pay | Admitting: Internal Medicine

## 2009-05-13 ENCOUNTER — Ambulatory Visit: Payer: Self-pay | Admitting: Internal Medicine

## 2009-05-13 DIAGNOSIS — F411 Generalized anxiety disorder: Secondary | ICD-10-CM

## 2009-05-13 DIAGNOSIS — R5383 Other fatigue: Secondary | ICD-10-CM

## 2009-05-13 DIAGNOSIS — K5909 Other constipation: Secondary | ICD-10-CM

## 2009-05-13 DIAGNOSIS — R5381 Other malaise: Secondary | ICD-10-CM

## 2009-05-13 DIAGNOSIS — G4700 Insomnia, unspecified: Secondary | ICD-10-CM

## 2009-05-13 HISTORY — DX: Other constipation: K59.09

## 2009-05-13 HISTORY — DX: Other malaise: R53.81

## 2009-05-13 HISTORY — DX: Other fatigue: R53.83

## 2009-05-13 HISTORY — DX: Generalized anxiety disorder: F41.1

## 2009-05-13 HISTORY — DX: Insomnia, unspecified: G47.00

## 2009-05-13 LAB — CONVERTED CEMR LAB
BUN: 14 mg/dL (ref 6–23)
Basophils Absolute: 0 10*3/uL (ref 0.0–0.1)
Bilirubin, Direct: 0.2 mg/dL (ref 0.0–0.3)
CO2: 34 meq/L — ABNORMAL HIGH (ref 19–32)
Chloride: 101 meq/L (ref 96–112)
Cholesterol: 196 mg/dL (ref 0–200)
Creatinine, Ser: 0.8 mg/dL (ref 0.4–1.2)
Eosinophils Absolute: 0.1 10*3/uL (ref 0.0–0.7)
Glucose, Bld: 88 mg/dL (ref 70–99)
HCT: 40.1 % (ref 36.0–46.0)
HDL: 68.4 mg/dL (ref >39.00)
Hemoglobin, Urine: NEGATIVE
Ketones, ur: NEGATIVE mg/dL
LDL Cholesterol: 108 mg/dL — ABNORMAL HIGH (ref 0–99)
Lymphs Abs: 1.6 10*3/uL (ref 0.7–4.0)
MCHC: 33.6 g/dL (ref 30.0–36.0)
MCV: 97.1 fL (ref 78.0–100.0)
Monocytes Absolute: 0.3 10*3/uL (ref 0.1–1.0)
Neutrophils Relative %: 59.8 % (ref 43.0–77.0)
Platelets: 216 10*3/uL (ref 150.0–400.0)
RDW: 12 % (ref 11.5–14.6)
Total Bilirubin: 0.9 mg/dL (ref 0.3–1.2)
Total CHOL/HDL Ratio: 3
Total Protein, Urine: NEGATIVE mg/dL
Triglycerides: 100 mg/dL (ref 0.0–149.0)
Urine Glucose: NEGATIVE mg/dL
Urobilinogen, UA: 0.2 (ref 0.0–1.0)
WBC: 5.1 10*3/uL (ref 4.5–10.5)

## 2009-05-14 ENCOUNTER — Telehealth (INDEPENDENT_AMBULATORY_CARE_PROVIDER_SITE_OTHER): Payer: Self-pay | Admitting: *Deleted

## 2009-05-20 ENCOUNTER — Telehealth: Payer: Self-pay | Admitting: Internal Medicine

## 2009-06-10 ENCOUNTER — Telehealth: Payer: Self-pay | Admitting: Internal Medicine

## 2009-06-21 ENCOUNTER — Ambulatory Visit: Payer: Self-pay | Admitting: Internal Medicine

## 2009-06-23 ENCOUNTER — Encounter: Admission: RE | Admit: 2009-06-23 | Discharge: 2009-06-23 | Payer: Self-pay | Admitting: Internal Medicine

## 2009-06-23 LAB — CBC WITH DIFFERENTIAL/PLATELET
Eosinophils Absolute: 0.1 10*3/uL (ref 0.0–0.5)
MONO#: 0.3 10*3/uL (ref 0.1–0.9)
MONO%: 7.7 % (ref 0.0–14.0)
NEUT#: 2.2 10*3/uL (ref 1.5–6.5)
RBC: 3.91 10*6/uL (ref 3.70–5.45)
RDW: 12.3 % (ref 11.2–14.5)
WBC: 4.1 10*3/uL (ref 3.9–10.3)
lymph#: 1.5 10*3/uL (ref 0.9–3.3)

## 2009-06-23 LAB — COMPREHENSIVE METABOLIC PANEL
Albumin: 4.1 g/dL (ref 3.5–5.2)
Alkaline Phosphatase: 64 U/L (ref 39–117)
CO2: 27 mEq/L (ref 19–32)
Glucose, Bld: 88 mg/dL (ref 70–99)
Potassium: 4 mEq/L (ref 3.5–5.3)
Sodium: 144 mEq/L (ref 135–145)
Total Protein: 6.3 g/dL (ref 6.0–8.3)

## 2009-07-01 ENCOUNTER — Encounter: Payer: Self-pay | Admitting: Critical Care Medicine

## 2009-07-01 ENCOUNTER — Encounter: Payer: Self-pay | Admitting: Internal Medicine

## 2009-07-13 ENCOUNTER — Ambulatory Visit (HOSPITAL_COMMUNITY): Admission: RE | Admit: 2009-07-13 | Discharge: 2009-07-13 | Payer: Self-pay | Admitting: Internal Medicine

## 2009-07-15 ENCOUNTER — Encounter: Payer: Self-pay | Admitting: Critical Care Medicine

## 2009-07-19 ENCOUNTER — Telehealth: Payer: Self-pay | Admitting: Internal Medicine

## 2009-09-20 ENCOUNTER — Ambulatory Visit (HOSPITAL_COMMUNITY): Admission: RE | Admit: 2009-09-20 | Discharge: 2009-09-20 | Payer: Self-pay | Admitting: Internal Medicine

## 2009-10-12 ENCOUNTER — Ambulatory Visit: Payer: Self-pay | Admitting: Internal Medicine

## 2009-10-14 ENCOUNTER — Ambulatory Visit (HOSPITAL_COMMUNITY): Admission: RE | Admit: 2009-10-14 | Discharge: 2009-10-14 | Payer: Self-pay | Admitting: Internal Medicine

## 2009-10-14 LAB — COMPREHENSIVE METABOLIC PANEL
AST: 17 U/L (ref 0–37)
Albumin: 4.4 g/dL (ref 3.5–5.2)
BUN: 16 mg/dL (ref 6–23)
Calcium: 9.5 mg/dL (ref 8.4–10.5)
Chloride: 101 mEq/L (ref 96–112)
Creatinine, Ser: 0.82 mg/dL (ref 0.40–1.20)
Glucose, Bld: 93 mg/dL (ref 70–99)
Potassium: 3.8 mEq/L (ref 3.5–5.3)

## 2009-10-14 LAB — CBC WITH DIFFERENTIAL/PLATELET
Basophils Absolute: 0 10*3/uL (ref 0.0–0.1)
EOS%: 1 % (ref 0.0–7.0)
MCHC: 34.5 g/dL (ref 31.5–36.0)
MONO#: 0.4 10*3/uL (ref 0.1–0.9)
NEUT%: 48.6 % (ref 38.4–76.8)
Platelets: 224 10*3/uL (ref 145–400)
RBC: 3.94 10*6/uL (ref 3.70–5.45)
RDW: 12.4 % (ref 11.2–14.5)
WBC: 5 10*3/uL (ref 3.9–10.3)

## 2009-10-18 ENCOUNTER — Encounter: Payer: Self-pay | Admitting: Internal Medicine

## 2009-10-29 ENCOUNTER — Telehealth: Payer: Self-pay | Admitting: Internal Medicine

## 2010-01-06 ENCOUNTER — Ambulatory Visit: Payer: Self-pay | Admitting: Internal Medicine

## 2010-01-10 ENCOUNTER — Encounter: Admission: RE | Admit: 2010-01-10 | Discharge: 2010-01-10 | Payer: Self-pay | Admitting: Internal Medicine

## 2010-01-10 LAB — CBC WITH DIFFERENTIAL/PLATELET
BASO%: 0.8 % (ref 0.0–2.0)
EOS%: 0.9 % (ref 0.0–7.0)
HGB: 13.3 g/dL (ref 11.6–15.9)
MCH: 32.7 pg (ref 25.1–34.0)
MCHC: 34.4 g/dL (ref 31.5–36.0)
RDW: 12.4 % (ref 11.2–14.5)
lymph#: 1.5 10*3/uL (ref 0.9–3.3)

## 2010-01-10 LAB — COMPREHENSIVE METABOLIC PANEL
ALT: 19 U/L (ref 0–35)
AST: 20 U/L (ref 0–37)
Albumin: 4 g/dL (ref 3.5–5.2)
Calcium: 9.4 mg/dL (ref 8.4–10.5)
Chloride: 102 mEq/L (ref 96–112)
Potassium: 4.1 mEq/L (ref 3.5–5.3)

## 2010-01-17 ENCOUNTER — Encounter: Payer: Self-pay | Admitting: Internal Medicine

## 2010-01-24 ENCOUNTER — Ambulatory Visit (HOSPITAL_COMMUNITY): Admission: RE | Admit: 2010-01-24 | Discharge: 2010-01-24 | Payer: Self-pay | Admitting: Internal Medicine

## 2010-01-31 ENCOUNTER — Encounter: Payer: Self-pay | Admitting: Internal Medicine

## 2010-02-04 ENCOUNTER — Ambulatory Visit: Admission: RE | Admit: 2010-02-04 | Discharge: 2010-03-14 | Payer: Self-pay | Admitting: Radiation Oncology

## 2010-02-08 ENCOUNTER — Ambulatory Visit: Payer: Self-pay | Admitting: Internal Medicine

## 2010-02-09 LAB — CBC WITH DIFFERENTIAL/PLATELET
Basophils Absolute: 0 10*3/uL (ref 0.0–0.1)
EOS%: 1 % (ref 0.0–7.0)
Eosinophils Absolute: 0.1 10*3/uL (ref 0.0–0.5)
HGB: 13.6 g/dL (ref 11.6–15.9)
MCH: 31.7 pg (ref 25.1–34.0)
MONO#: 0.6 10*3/uL (ref 0.1–0.9)
NEUT#: 3.2 10*3/uL (ref 1.5–6.5)
RDW: 12.1 % (ref 11.2–14.5)
WBC: 6.7 10*3/uL (ref 3.9–10.3)
lymph#: 2.8 10*3/uL (ref 0.9–3.3)

## 2010-02-09 LAB — COMPREHENSIVE METABOLIC PANEL
AST: 17 U/L (ref 0–37)
Albumin: 4.6 g/dL (ref 3.5–5.2)
BUN: 17 mg/dL (ref 6–23)
CO2: 26 mEq/L (ref 19–32)
Calcium: 9.3 mg/dL (ref 8.4–10.5)
Chloride: 101 mEq/L (ref 96–112)
Potassium: 4.1 mEq/L (ref 3.5–5.3)

## 2010-02-25 ENCOUNTER — Ambulatory Visit: Payer: Self-pay | Admitting: Internal Medicine

## 2010-02-25 DIAGNOSIS — C3492 Malignant neoplasm of unspecified part of left bronchus or lung: Secondary | ICD-10-CM

## 2010-03-08 ENCOUNTER — Encounter: Payer: Self-pay | Admitting: Internal Medicine

## 2010-03-10 ENCOUNTER — Encounter: Admission: RE | Admit: 2010-03-10 | Discharge: 2010-03-10 | Payer: Self-pay | Admitting: Internal Medicine

## 2010-03-15 ENCOUNTER — Ambulatory Visit: Payer: Self-pay | Admitting: Internal Medicine

## 2010-03-17 ENCOUNTER — Encounter: Payer: Self-pay | Admitting: Internal Medicine

## 2010-03-17 LAB — CBC WITH DIFFERENTIAL/PLATELET
Basophils Absolute: 0 10*3/uL (ref 0.0–0.1)
EOS%: 0.7 % (ref 0.0–7.0)
HGB: 13.7 g/dL (ref 11.6–15.9)
MCH: 32.8 pg (ref 25.1–34.0)
MCV: 93.8 fL (ref 79.5–101.0)
MONO%: 8.7 % (ref 0.0–14.0)
NEUT#: 2.1 10*3/uL (ref 1.5–6.5)
RBC: 4.17 10*6/uL (ref 3.70–5.45)
RDW: 12 % (ref 11.2–14.5)
lymph#: 1.8 10*3/uL (ref 0.9–3.3)

## 2010-03-17 LAB — COMPREHENSIVE METABOLIC PANEL
ALT: 17 U/L (ref 0–35)
AST: 15 U/L (ref 0–37)
Albumin: 4.5 g/dL (ref 3.5–5.2)
Alkaline Phosphatase: 63 U/L (ref 39–117)
BUN: 15 mg/dL (ref 6–23)
Calcium: 9.2 mg/dL (ref 8.4–10.5)
Chloride: 102 mEq/L (ref 96–112)
Potassium: 4.2 mEq/L (ref 3.5–5.3)
Sodium: 141 mEq/L (ref 135–145)
Total Protein: 6.8 g/dL (ref 6.0–8.3)

## 2010-03-23 LAB — CBC WITH DIFFERENTIAL/PLATELET
Basophils Absolute: 0 10*3/uL (ref 0.0–0.1)
EOS%: 0 % (ref 0.0–7.0)
Eosinophils Absolute: 0 10*3/uL (ref 0.0–0.5)
HCT: 38.5 % (ref 34.8–46.6)
HGB: 13.3 g/dL (ref 11.6–15.9)
MCH: 31.9 pg (ref 25.1–34.0)
MCV: 92.3 fL (ref 79.5–101.0)
MONO%: 1.7 % (ref 0.0–14.0)
NEUT#: 11.8 10*3/uL — ABNORMAL HIGH (ref 1.5–6.5)
NEUT%: 92.1 % — ABNORMAL HIGH (ref 38.4–76.8)
Platelets: 223 10*3/uL (ref 145–400)
RDW: 12.2 % (ref 11.2–14.5)

## 2010-03-23 LAB — COMPREHENSIVE METABOLIC PANEL
AST: 22 U/L (ref 0–37)
Albumin: 4.3 g/dL (ref 3.5–5.2)
Alkaline Phosphatase: 59 U/L (ref 39–117)
BUN: 16 mg/dL (ref 6–23)
Calcium: 9.9 mg/dL (ref 8.4–10.5)
Chloride: 102 mEq/L (ref 96–112)
Creatinine, Ser: 0.62 mg/dL (ref 0.40–1.20)
Glucose, Bld: 150 mg/dL — ABNORMAL HIGH (ref 70–99)
Potassium: 4 mEq/L (ref 3.5–5.3)

## 2010-03-30 ENCOUNTER — Encounter: Payer: Self-pay | Admitting: Internal Medicine

## 2010-03-30 ENCOUNTER — Encounter (INDEPENDENT_AMBULATORY_CARE_PROVIDER_SITE_OTHER): Payer: Self-pay | Admitting: *Deleted

## 2010-03-30 LAB — CBC WITH DIFFERENTIAL/PLATELET
Basophils Absolute: 0 10*3/uL (ref 0.0–0.1)
Eosinophils Absolute: 0 10*3/uL (ref 0.0–0.5)
HCT: 36.8 % (ref 34.8–46.6)
HGB: 13 g/dL (ref 11.6–15.9)
NEUT#: 1.4 10*3/uL — ABNORMAL LOW (ref 1.5–6.5)
NEUT%: 41.8 % (ref 38.4–76.8)
RDW: 11.9 % (ref 11.2–14.5)
lymph#: 1.6 10*3/uL (ref 0.9–3.3)

## 2010-03-30 LAB — COMPREHENSIVE METABOLIC PANEL
Albumin: 4.2 g/dL (ref 3.5–5.2)
BUN: 20 mg/dL (ref 6–23)
CO2: 27 mEq/L (ref 19–32)
Calcium: 9.8 mg/dL (ref 8.4–10.5)
Chloride: 102 mEq/L (ref 96–112)
Creatinine, Ser: 0.71 mg/dL (ref 0.40–1.20)
Glucose, Bld: 103 mg/dL — ABNORMAL HIGH (ref 70–99)
Potassium: 4.2 mEq/L (ref 3.5–5.3)

## 2010-04-05 ENCOUNTER — Telehealth: Payer: Self-pay | Admitting: Internal Medicine

## 2010-04-06 LAB — COMPREHENSIVE METABOLIC PANEL
Alkaline Phosphatase: 56 U/L (ref 39–117)
BUN: 18 mg/dL (ref 6–23)
Glucose, Bld: 99 mg/dL (ref 70–99)
Sodium: 138 mEq/L (ref 135–145)
Total Bilirubin: 0.3 mg/dL (ref 0.3–1.2)

## 2010-04-06 LAB — CBC WITH DIFFERENTIAL/PLATELET
Basophils Absolute: 0 10*3/uL (ref 0.0–0.1)
HCT: 35.5 % (ref 34.8–46.6)
HGB: 12.4 g/dL (ref 11.6–15.9)
MCH: 32.8 pg (ref 25.1–34.0)
MONO#: 0.3 10*3/uL (ref 0.1–0.9)
NEUT%: 48.4 % (ref 38.4–76.8)
lymph#: 2.1 10*3/uL (ref 0.9–3.3)

## 2010-04-12 ENCOUNTER — Encounter: Payer: Self-pay | Admitting: Internal Medicine

## 2010-04-12 LAB — CBC WITH DIFFERENTIAL/PLATELET
Basophils Absolute: 0 10*3/uL (ref 0.0–0.1)
Eosinophils Absolute: 0 10*3/uL (ref 0.0–0.5)
HGB: 12.4 g/dL (ref 11.6–15.9)
LYMPH%: 20.4 % (ref 14.0–49.7)
MCV: 94 fL (ref 79.5–101.0)
MONO%: 2.4 % (ref 0.0–14.0)
NEUT#: 2.9 10*3/uL (ref 1.5–6.5)
Platelets: 209 10*3/uL (ref 145–400)

## 2010-04-12 LAB — COMPREHENSIVE METABOLIC PANEL
ALT: 45 U/L — ABNORMAL HIGH (ref 0–35)
AST: 25 U/L (ref 0–37)
Albumin: 4.4 g/dL (ref 3.5–5.2)
Alkaline Phosphatase: 56 U/L (ref 39–117)
BUN: 18 mg/dL (ref 6–23)
CO2: 24 mEq/L (ref 19–32)
Calcium: 9.7 mg/dL (ref 8.4–10.5)
Chloride: 103 mEq/L (ref 96–112)
Creatinine, Ser: 0.76 mg/dL (ref 0.40–1.20)
Glucose, Bld: 127 mg/dL — ABNORMAL HIGH (ref 70–99)
Potassium: 4.2 mEq/L (ref 3.5–5.3)
Sodium: 141 mEq/L (ref 135–145)
Total Bilirubin: 0.3 mg/dL (ref 0.3–1.2)
Total Protein: 6.9 g/dL (ref 6.0–8.3)

## 2010-04-19 ENCOUNTER — Encounter: Payer: Self-pay | Admitting: Internal Medicine

## 2010-04-19 ENCOUNTER — Ambulatory Visit: Payer: Self-pay | Admitting: Internal Medicine

## 2010-04-21 LAB — CBC WITH DIFFERENTIAL/PLATELET
BASO%: 0.4 % (ref 0.0–2.0)
Basophils Absolute: 0 10*3/uL (ref 0.0–0.1)
EOS%: 0 % (ref 0.0–7.0)
HGB: 11.6 g/dL (ref 11.6–15.9)
MCH: 32 pg (ref 25.1–34.0)
MCHC: 34.7 g/dL (ref 31.5–36.0)
RBC: 3.62 10*6/uL — ABNORMAL LOW (ref 3.70–5.45)
RDW: 12.6 % (ref 11.2–14.5)
lymph#: 2.6 10*3/uL (ref 0.9–3.3)

## 2010-04-21 LAB — COMPREHENSIVE METABOLIC PANEL
AST: 22 U/L (ref 0–37)
Albumin: 4 g/dL (ref 3.5–5.2)
BUN: 20 mg/dL (ref 6–23)
Calcium: 8.8 mg/dL (ref 8.4–10.5)
Chloride: 104 mEq/L (ref 96–112)
Potassium: 3.9 mEq/L (ref 3.5–5.3)
Sodium: 138 mEq/L (ref 135–145)
Total Protein: 6.1 g/dL (ref 6.0–8.3)

## 2010-04-27 LAB — CBC WITH DIFFERENTIAL/PLATELET
Basophils Absolute: 0 10*3/uL (ref 0.0–0.1)
Eosinophils Absolute: 0 10*3/uL (ref 0.0–0.5)
HGB: 11.2 g/dL — ABNORMAL LOW (ref 11.6–15.9)
MONO#: 0.3 10*3/uL (ref 0.1–0.9)
NEUT#: 1.6 10*3/uL (ref 1.5–6.5)
RBC: 3.37 10*6/uL — ABNORMAL LOW (ref 3.70–5.45)
RDW: 12.5 % (ref 11.2–14.5)
WBC: 4 10*3/uL (ref 3.9–10.3)
lymph#: 2.1 10*3/uL (ref 0.9–3.3)

## 2010-04-27 LAB — COMPREHENSIVE METABOLIC PANEL
AST: 33 U/L (ref 0–37)
Albumin: 4.3 g/dL (ref 3.5–5.2)
BUN: 16 mg/dL (ref 6–23)
Calcium: 9.7 mg/dL (ref 8.4–10.5)
Chloride: 101 mEq/L (ref 96–112)
Glucose, Bld: 110 mg/dL — ABNORMAL HIGH (ref 70–99)
Potassium: 3.8 mEq/L (ref 3.5–5.3)
Sodium: 138 mEq/L (ref 135–145)
Total Protein: 6.5 g/dL (ref 6.0–8.3)

## 2010-05-04 ENCOUNTER — Encounter: Payer: Self-pay | Admitting: Internal Medicine

## 2010-05-04 LAB — CBC WITH DIFFERENTIAL/PLATELET
BASO%: 0.2 % (ref 0.0–2.0)
Basophils Absolute: 0 10e3/uL (ref 0.0–0.1)
EOS%: 0 % (ref 0.0–7.0)
Eosinophils Absolute: 0 10e3/uL (ref 0.0–0.5)
HCT: 34.1 % — ABNORMAL LOW (ref 34.8–46.6)
HGB: 11.8 g/dL (ref 11.6–15.9)
LYMPH%: 9.4 % — ABNORMAL LOW (ref 14.0–49.7)
MCH: 32.5 pg (ref 25.1–34.0)
MCHC: 34.6 g/dL (ref 31.5–36.0)
MCV: 93.9 fL (ref 79.5–101.0)
MONO#: 0.2 10e3/uL (ref 0.1–0.9)
MONO%: 3.2 % (ref 0.0–14.0)
NEUT#: 5.5 10e3/uL (ref 1.5–6.5)
NEUT%: 87.2 % — ABNORMAL HIGH (ref 38.4–76.8)
Platelets: 180 10e3/uL (ref 145–400)
RBC: 3.63 10e6/uL — ABNORMAL LOW (ref 3.70–5.45)
RDW: 14.1 % (ref 11.2–14.5)
WBC: 6.3 10e3/uL (ref 3.9–10.3)
lymph#: 0.6 10e3/uL — ABNORMAL LOW (ref 0.9–3.3)

## 2010-05-04 LAB — COMPREHENSIVE METABOLIC PANEL
ALT: 51 U/L — ABNORMAL HIGH (ref 0–35)
AST: 28 U/L (ref 0–37)
Albumin: 4.7 g/dL (ref 3.5–5.2)
Alkaline Phosphatase: 55 U/L (ref 39–117)
BUN: 18 mg/dL (ref 6–23)
Creatinine, Ser: 0.69 mg/dL (ref 0.40–1.20)
Potassium: 4.2 mEq/L (ref 3.5–5.3)

## 2010-05-23 ENCOUNTER — Ambulatory Visit: Payer: Self-pay | Admitting: Internal Medicine

## 2010-05-23 ENCOUNTER — Encounter: Admission: RE | Admit: 2010-05-23 | Discharge: 2010-05-23 | Payer: Self-pay | Admitting: Internal Medicine

## 2010-05-25 ENCOUNTER — Encounter: Payer: Self-pay | Admitting: Internal Medicine

## 2010-05-25 LAB — CBC WITH DIFFERENTIAL/PLATELET
Basophils Absolute: 0 10*3/uL (ref 0.0–0.1)
Eosinophils Absolute: 0 10*3/uL (ref 0.0–0.5)
HCT: 30.3 % — ABNORMAL LOW (ref 34.8–46.6)
HGB: 10.2 g/dL — ABNORMAL LOW (ref 11.6–15.9)
MONO#: 0.5 10*3/uL (ref 0.1–0.9)
NEUT%: 45.3 % (ref 38.4–76.8)
WBC: 3.8 10*3/uL — ABNORMAL LOW (ref 3.9–10.3)
lymph#: 1.6 10*3/uL (ref 0.9–3.3)

## 2010-05-25 LAB — COMPREHENSIVE METABOLIC PANEL
BUN: 11 mg/dL (ref 6–23)
CO2: 24 mEq/L (ref 19–32)
Calcium: 10.3 mg/dL (ref 8.4–10.5)
Chloride: 101 mEq/L (ref 96–112)
Creatinine, Ser: 0.66 mg/dL (ref 0.40–1.20)
Total Bilirubin: 0.4 mg/dL (ref 0.3–1.2)

## 2010-05-31 ENCOUNTER — Telehealth: Payer: Self-pay | Admitting: Internal Medicine

## 2010-06-01 LAB — CBC WITH DIFFERENTIAL/PLATELET
Basophils Absolute: 0 10*3/uL (ref 0.0–0.1)
EOS%: 0.1 % (ref 0.0–7.0)
HCT: 29.3 % — ABNORMAL LOW (ref 34.8–46.6)
HGB: 9.9 g/dL — ABNORMAL LOW (ref 11.6–15.9)
MCH: 33.8 pg (ref 25.1–34.0)
MCV: 99.7 fL (ref 79.5–101.0)
MONO%: 9.3 % (ref 0.0–14.0)
NEUT%: 36 % — ABNORMAL LOW (ref 38.4–76.8)
Platelets: 354 10*3/uL (ref 145–400)
lymph#: 2.2 10*3/uL (ref 0.9–3.3)

## 2010-06-01 LAB — COMPREHENSIVE METABOLIC PANEL
AST: 22 U/L (ref 0–37)
BUN: 18 mg/dL (ref 6–23)
Calcium: 9.7 mg/dL (ref 8.4–10.5)
Chloride: 102 mEq/L (ref 96–112)
Creatinine, Ser: 0.78 mg/dL (ref 0.40–1.20)

## 2010-06-08 LAB — CBC WITH DIFFERENTIAL/PLATELET
Basophils Absolute: 0 10*3/uL (ref 0.0–0.1)
EOS%: 0 % (ref 0.0–7.0)
Eosinophils Absolute: 0 10*3/uL (ref 0.0–0.5)
HGB: 9.4 g/dL — ABNORMAL LOW (ref 11.6–15.9)
NEUT#: 1.7 10*3/uL (ref 1.5–6.5)
RBC: 2.64 10*6/uL — ABNORMAL LOW (ref 3.70–5.45)
RDW: 19.5 % — ABNORMAL HIGH (ref 11.2–14.5)
WBC: 4 10*3/uL (ref 3.9–10.3)
lymph#: 2 10*3/uL (ref 0.9–3.3)

## 2010-06-08 LAB — COMPREHENSIVE METABOLIC PANEL
AST: 34 U/L (ref 0–37)
Albumin: 4.2 g/dL (ref 3.5–5.2)
Alkaline Phosphatase: 50 U/L (ref 39–117)
BUN: 11 mg/dL (ref 6–23)
Calcium: 9.1 mg/dL (ref 8.4–10.5)
Chloride: 103 mEq/L (ref 96–112)
Glucose, Bld: 103 mg/dL — ABNORMAL HIGH (ref 70–99)
Potassium: 4.1 mEq/L (ref 3.5–5.3)
Sodium: 139 mEq/L (ref 135–145)
Total Protein: 6.1 g/dL (ref 6.0–8.3)

## 2010-06-15 ENCOUNTER — Encounter: Payer: Self-pay | Admitting: Internal Medicine

## 2010-06-22 ENCOUNTER — Ambulatory Visit: Payer: Self-pay | Admitting: Internal Medicine

## 2010-06-22 LAB — COMPREHENSIVE METABOLIC PANEL
ALT: 57 U/L — ABNORMAL HIGH (ref 0–35)
AST: 42 U/L — ABNORMAL HIGH (ref 0–37)
Albumin: 4.3 g/dL (ref 3.5–5.2)
Alkaline Phosphatase: 53 U/L (ref 39–117)
Calcium: 9.1 mg/dL (ref 8.4–10.5)
Chloride: 101 mEq/L (ref 96–112)
Potassium: 3.8 mEq/L (ref 3.5–5.3)
Sodium: 137 mEq/L (ref 135–145)
Total Protein: 6.9 g/dL (ref 6.0–8.3)

## 2010-06-22 LAB — CBC WITH DIFFERENTIAL/PLATELET
BASO%: 0.5 % (ref 0.0–2.0)
Basophils Absolute: 0 10*3/uL (ref 0.0–0.1)
EOS%: 0.3 % (ref 0.0–7.0)
HGB: 10.1 g/dL — ABNORMAL LOW (ref 11.6–15.9)
MCH: 36.2 pg — ABNORMAL HIGH (ref 25.1–34.0)
MCV: 103.2 fL — ABNORMAL HIGH (ref 79.5–101.0)
MONO%: 9.6 % (ref 0.0–14.0)
RBC: 2.79 10*6/uL — ABNORMAL LOW (ref 3.70–5.45)
RDW: 20.2 % — ABNORMAL HIGH (ref 11.2–14.5)
lymph#: 2 10*3/uL (ref 0.9–3.3)

## 2010-06-28 ENCOUNTER — Telehealth: Payer: Self-pay | Admitting: Internal Medicine

## 2010-06-29 ENCOUNTER — Ambulatory Visit: Payer: Self-pay | Admitting: Internal Medicine

## 2010-06-29 LAB — CONVERTED CEMR LAB
ALT: 46 units/L — ABNORMAL HIGH (ref 0–35)
AST: 35 units/L (ref 0–37)
Albumin: 3.8 g/dL (ref 3.5–5.2)
BUN: 15 mg/dL (ref 6–23)
Basophils Relative: 0.3 % (ref 0.0–3.0)
Chloride: 107 meq/L (ref 96–112)
Cholesterol: 203 mg/dL — ABNORMAL HIGH (ref 0–200)
Eosinophils Relative: 0.2 % (ref 0.0–5.0)
GFR calc non Af Amer: 83.26 mL/min (ref >60)
HCT: 25.3 % — ABNORMAL LOW (ref 36.0–46.0)
Hemoglobin: 8.9 g/dL — ABNORMAL LOW (ref 12.0–15.0)
Lymphs Abs: 1.5 10*3/uL (ref 0.7–4.0)
MCV: 108.1 fL — ABNORMAL HIGH (ref 78.0–100.0)
Monocytes Absolute: 0.3 10*3/uL (ref 0.1–1.0)
Monocytes Relative: 9.2 % (ref 3.0–12.0)
Neutro Abs: 1.6 10*3/uL (ref 1.4–7.7)
Potassium: 5.2 meq/L — ABNORMAL HIGH (ref 3.5–5.1)
Sodium: 143 meq/L (ref 135–145)
Specific Gravity, Urine: 1.01 (ref 1.000–1.030)
TSH: 0.48 microintl units/mL (ref 0.35–5.50)
Total Protein, Urine: NEGATIVE mg/dL
Total Protein: 6.3 g/dL (ref 6.0–8.3)
Urine Glucose: NEGATIVE mg/dL
Urobilinogen, UA: 0.2 (ref 0.0–1.0)
VLDL: 19.8 mg/dL (ref 0.0–40.0)
WBC: 3.4 10*3/uL — ABNORMAL LOW (ref 4.5–10.5)
pH: 8 (ref 5.0–8.0)

## 2010-06-30 ENCOUNTER — Telehealth: Payer: Self-pay | Admitting: Internal Medicine

## 2010-07-06 ENCOUNTER — Encounter: Payer: Self-pay | Admitting: Internal Medicine

## 2010-07-06 LAB — CBC WITH DIFFERENTIAL/PLATELET
BASO%: 0 % (ref 0.0–2.0)
EOS%: 0 % (ref 0.0–7.0)
MCH: 35.6 pg — ABNORMAL HIGH (ref 25.1–34.0)
MCHC: 33.2 g/dL (ref 31.5–36.0)
MONO#: 0.5 10*3/uL (ref 0.1–0.9)
NEUT%: 85.2 % — ABNORMAL HIGH (ref 38.4–76.8)
RBC: 2.53 10*6/uL — ABNORMAL LOW (ref 3.70–5.45)
WBC: 6.1 10*3/uL (ref 3.9–10.3)
lymph#: 0.5 10*3/uL — ABNORMAL LOW (ref 0.9–3.3)
nRBC: 0 % (ref 0–0)

## 2010-07-06 LAB — COMPREHENSIVE METABOLIC PANEL
ALT: 36 U/L — ABNORMAL HIGH (ref 0–35)
AST: 22 U/L (ref 0–37)
Albumin: 4.5 g/dL (ref 3.5–5.2)
CO2: 27 mEq/L (ref 19–32)
Calcium: 9.4 mg/dL (ref 8.4–10.5)
Chloride: 101 mEq/L (ref 96–112)
Potassium: 4 mEq/L (ref 3.5–5.3)
Sodium: 136 mEq/L (ref 135–145)
Total Protein: 6.4 g/dL (ref 6.0–8.3)

## 2010-07-13 LAB — COMPREHENSIVE METABOLIC PANEL
AST: 27 U/L (ref 0–37)
Albumin: 4.3 g/dL (ref 3.5–5.2)
Alkaline Phosphatase: 54 U/L (ref 39–117)
Calcium: 9.7 mg/dL (ref 8.4–10.5)
Chloride: 102 mEq/L (ref 96–112)
Potassium: 3.8 mEq/L (ref 3.5–5.3)
Sodium: 140 mEq/L (ref 135–145)
Total Protein: 6.4 g/dL (ref 6.0–8.3)

## 2010-07-13 LAB — CBC WITH DIFFERENTIAL/PLATELET
Basophils Absolute: 0 10*3/uL (ref 0.0–0.1)
EOS%: 0.1 % (ref 0.0–7.0)
Eosinophils Absolute: 0 10*3/uL (ref 0.0–0.5)
HGB: 8.8 g/dL — ABNORMAL LOW (ref 11.6–15.9)
MCH: 37.7 pg — ABNORMAL HIGH (ref 25.1–34.0)
MCV: 108.8 fL — ABNORMAL HIGH (ref 79.5–101.0)
MONO%: 10.3 % (ref 0.0–14.0)
NEUT#: 1.3 10*3/uL — ABNORMAL LOW (ref 1.5–6.5)
RBC: 2.35 10*6/uL — ABNORMAL LOW (ref 3.70–5.45)
RDW: 17.8 % — ABNORMAL HIGH (ref 11.2–14.5)
lymph#: 2 10*3/uL (ref 0.9–3.3)

## 2010-07-19 ENCOUNTER — Ambulatory Visit: Payer: Self-pay | Admitting: Internal Medicine

## 2010-07-25 ENCOUNTER — Encounter: Admission: RE | Admit: 2010-07-25 | Discharge: 2010-07-25 | Payer: Self-pay | Admitting: Internal Medicine

## 2010-08-03 ENCOUNTER — Encounter: Payer: Self-pay | Admitting: Internal Medicine

## 2010-08-03 LAB — CBC WITH DIFFERENTIAL/PLATELET
BASO%: 0.1 % (ref 0.0–2.0)
EOS%: 0.1 % (ref 0.0–7.0)
HCT: 30.8 % — ABNORMAL LOW (ref 34.8–46.6)
MCH: 37.1 pg — ABNORMAL HIGH (ref 25.1–34.0)
MCHC: 33.8 g/dL (ref 31.5–36.0)
NEUT%: 92.5 % — ABNORMAL HIGH (ref 38.4–76.8)
RBC: 2.8 10*6/uL — ABNORMAL LOW (ref 3.70–5.45)
RDW: 14.4 % (ref 11.2–14.5)
lymph#: 0.5 10*3/uL — ABNORMAL LOW (ref 0.9–3.3)

## 2010-08-03 LAB — COMPREHENSIVE METABOLIC PANEL
ALT: 28 U/L (ref 0–35)
AST: 25 U/L (ref 0–37)
BUN: 17 mg/dL (ref 6–23)
Calcium: 9.7 mg/dL (ref 8.4–10.5)
Chloride: 102 mEq/L (ref 96–112)
Creatinine, Ser: 0.69 mg/dL (ref 0.40–1.20)
Glucose, Bld: 136 mg/dL — ABNORMAL HIGH (ref 70–99)

## 2010-08-16 ENCOUNTER — Telehealth: Payer: Self-pay | Admitting: Internal Medicine

## 2010-08-17 ENCOUNTER — Telehealth (INDEPENDENT_AMBULATORY_CARE_PROVIDER_SITE_OTHER): Payer: Self-pay | Admitting: *Deleted

## 2010-08-18 ENCOUNTER — Encounter: Payer: Self-pay | Admitting: Internal Medicine

## 2010-08-23 ENCOUNTER — Telehealth: Payer: Self-pay | Admitting: Internal Medicine

## 2010-08-26 ENCOUNTER — Ambulatory Visit: Payer: Self-pay | Admitting: Internal Medicine

## 2010-08-29 ENCOUNTER — Encounter: Payer: Self-pay | Admitting: Critical Care Medicine

## 2010-08-29 LAB — COMPREHENSIVE METABOLIC PANEL
AST: 30 U/L (ref 0–37)
Albumin: 4 g/dL (ref 3.5–5.2)
Alkaline Phosphatase: 52 U/L (ref 39–117)
BUN: 19 mg/dL (ref 6–23)
Potassium: 4.1 mEq/L (ref 3.5–5.3)

## 2010-08-29 LAB — CBC WITH DIFFERENTIAL/PLATELET
Basophils Absolute: 0 10*3/uL (ref 0.0–0.1)
EOS%: 0 % (ref 0.0–7.0)
HCT: 35.2 % (ref 34.8–46.6)
HGB: 11.8 g/dL (ref 11.6–15.9)
LYMPH%: 8.9 % — ABNORMAL LOW (ref 14.0–49.7)
MCH: 35.1 pg — ABNORMAL HIGH (ref 25.1–34.0)
MCV: 104.8 fL — ABNORMAL HIGH (ref 79.5–101.0)
MONO%: 1.5 % (ref 0.0–14.0)
NEUT%: 89.5 % — ABNORMAL HIGH (ref 38.4–76.8)
RDW: 12.7 % (ref 11.2–14.5)

## 2010-09-16 ENCOUNTER — Telehealth: Payer: Self-pay | Admitting: Internal Medicine

## 2010-09-17 ENCOUNTER — Ambulatory Visit: Payer: Self-pay | Admitting: Family Medicine

## 2010-09-19 ENCOUNTER — Telehealth: Payer: Self-pay | Admitting: Internal Medicine

## 2010-09-19 ENCOUNTER — Encounter: Payer: Self-pay | Admitting: Internal Medicine

## 2010-09-19 LAB — COMPREHENSIVE METABOLIC PANEL
ALT: 35 U/L (ref 0–35)
AST: 24 U/L (ref 0–37)
Creatinine, Ser: 0.93 mg/dL (ref 0.40–1.20)
Total Bilirubin: 0.4 mg/dL (ref 0.3–1.2)

## 2010-09-19 LAB — CBC WITH DIFFERENTIAL/PLATELET
BASO%: 0.2 % (ref 0.0–2.0)
Basophils Absolute: 0 10*3/uL (ref 0.0–0.1)
HCT: 36.5 % (ref 34.8–46.6)
LYMPH%: 8.4 % — ABNORMAL LOW (ref 14.0–49.7)
MCH: 34.8 pg — ABNORMAL HIGH (ref 25.1–34.0)
MCHC: 34.2 g/dL (ref 31.5–36.0)
MONO#: 0.2 10*3/uL (ref 0.1–0.9)
NEUT%: 88.5 % — ABNORMAL HIGH (ref 38.4–76.8)
Platelets: 182 10*3/uL (ref 145–400)
WBC: 5.5 10*3/uL (ref 3.9–10.3)

## 2010-09-22 ENCOUNTER — Ambulatory Visit (HOSPITAL_COMMUNITY)
Admission: RE | Admit: 2010-09-22 | Discharge: 2010-09-22 | Payer: Self-pay | Source: Home / Self Care | Attending: Internal Medicine | Admitting: Internal Medicine

## 2010-09-22 ENCOUNTER — Encounter: Payer: Self-pay | Admitting: Internal Medicine

## 2010-10-04 ENCOUNTER — Other Ambulatory Visit
Admission: RE | Admit: 2010-10-04 | Discharge: 2010-10-04 | Payer: Self-pay | Source: Home / Self Care | Admitting: Gynecology

## 2010-10-04 ENCOUNTER — Ambulatory Visit
Admission: RE | Admit: 2010-10-04 | Discharge: 2010-10-04 | Payer: Self-pay | Source: Home / Self Care | Attending: Gynecology | Admitting: Gynecology

## 2010-10-05 ENCOUNTER — Ambulatory Visit (HOSPITAL_COMMUNITY)
Admission: RE | Admit: 2010-10-05 | Discharge: 2010-10-05 | Payer: Self-pay | Source: Home / Self Care | Attending: Internal Medicine | Admitting: Internal Medicine

## 2010-10-10 ENCOUNTER — Encounter: Payer: Self-pay | Admitting: Internal Medicine

## 2010-10-10 ENCOUNTER — Ambulatory Visit: Payer: Self-pay | Admitting: Internal Medicine

## 2010-10-10 LAB — COMPREHENSIVE METABOLIC PANEL
ALT: 36 U/L — ABNORMAL HIGH (ref 0–35)
AST: 22 U/L (ref 0–37)
Albumin: 4.8 g/dL (ref 3.5–5.2)
Alkaline Phosphatase: 57 U/L (ref 39–117)
BUN: 12 mg/dL (ref 6–23)
CO2: 22 mEq/L (ref 19–32)
Calcium: 9.7 mg/dL (ref 8.4–10.5)
Chloride: 103 mEq/L (ref 96–112)
Creatinine, Ser: 0.63 mg/dL (ref 0.40–1.20)
Glucose, Bld: 129 mg/dL — ABNORMAL HIGH (ref 70–99)
Potassium: 4.1 mEq/L (ref 3.5–5.3)
Sodium: 137 mEq/L (ref 135–145)
Total Bilirubin: 0.3 mg/dL (ref 0.3–1.2)
Total Protein: 7 g/dL (ref 6.0–8.3)

## 2010-10-10 LAB — CBC WITH DIFFERENTIAL/PLATELET
BASO%: 0 % (ref 0.0–2.0)
Basophils Absolute: 0 10*3/uL (ref 0.0–0.1)
EOS%: 0 % (ref 0.0–7.0)
Eosinophils Absolute: 0 10*3/uL (ref 0.0–0.5)
HCT: 35.8 % (ref 34.8–46.6)
HGB: 12.2 g/dL (ref 11.6–15.9)
LYMPH%: 4.6 % — ABNORMAL LOW (ref 14.0–49.7)
MCH: 33.8 pg (ref 25.1–34.0)
MCHC: 34.1 g/dL (ref 31.5–36.0)
MCV: 99.2 fL (ref 79.5–101.0)
MONO#: 0.4 10*3/uL (ref 0.1–0.9)
MONO%: 4.2 % (ref 0.0–14.0)
NEUT#: 7.9 10*3/uL — ABNORMAL HIGH (ref 1.5–6.5)
NEUT%: 91.2 % — ABNORMAL HIGH (ref 38.4–76.8)
Platelets: 271 10*3/uL (ref 145–400)
RBC: 3.61 10*6/uL — ABNORMAL LOW (ref 3.70–5.45)
RDW: 12.8 % (ref 11.2–14.5)
WBC: 8.7 10*3/uL (ref 3.9–10.3)
lymph#: 0.4 10*3/uL — ABNORMAL LOW (ref 0.9–3.3)

## 2010-10-11 ENCOUNTER — Encounter (INDEPENDENT_AMBULATORY_CARE_PROVIDER_SITE_OTHER): Payer: Self-pay | Admitting: *Deleted

## 2010-10-23 ENCOUNTER — Encounter: Payer: Self-pay | Admitting: Internal Medicine

## 2010-10-24 ENCOUNTER — Ambulatory Visit: Admit: 2010-10-24 | Payer: Self-pay | Admitting: Internal Medicine

## 2010-10-31 ENCOUNTER — Encounter: Payer: Self-pay | Admitting: Internal Medicine

## 2010-10-31 LAB — COMPREHENSIVE METABOLIC PANEL
AST: 28 U/L (ref 0–37)
Alkaline Phosphatase: 55 U/L (ref 39–117)
Glucose, Bld: 116 mg/dL — ABNORMAL HIGH (ref 70–99)
Sodium: 139 mEq/L (ref 135–145)
Total Bilirubin: 0.5 mg/dL (ref 0.3–1.2)
Total Protein: 7.4 g/dL (ref 6.0–8.3)

## 2010-10-31 LAB — CBC WITH DIFFERENTIAL/PLATELET
BASO%: 0 % (ref 0.0–2.0)
EOS%: 0 % (ref 0.0–7.0)
MCH: 33.5 pg (ref 25.1–34.0)
MCHC: 34.6 g/dL (ref 31.5–36.0)
MCV: 96.8 fL (ref 79.5–101.0)
MONO%: 5.6 % (ref 0.0–14.0)
RBC: 3.79 10*6/uL (ref 3.70–5.45)
RDW: 12.9 % (ref 11.2–14.5)
lymph#: 0.5 10*3/uL — ABNORMAL LOW (ref 0.9–3.3)
nRBC: 0 % (ref 0–0)

## 2010-11-03 NOTE — Progress Notes (Signed)
Summary: Call Report  Phone Note Other Incoming   Caller: [facility] Summary of Call: [facility] Triage Call Report Triage Record Num: [id number] Operator: Meribeth Mattes Patient Name: Tamara Ortiz Call Date & Time: 09/17/2010 8:09:33AM Patient Phone: [phone] PCP: [name] Patient Gender: Female PCP Fax : [phone] Patient DOB: 11-21-1940 Practice Name: [facility] Reason for Call: Cough and nasal congestion, thinks it is a sinus infection, pain around eyes, headache, afebrile, taking fluids ok, no diff breathing Pt to call office at 0900 for appt time. Protocol(s) Used: Upper Respiratory Infection (URI) Recommended Outcome per Protocol: See Provider within 24 hours Reason for Outcome: Mild to moderate headache for more than 24 hours unrelieved with nonprescription medications Care Advice:  ~ 12/ Initial call taken by: Margaret Pyle, CMA,  September 19, 2010 9:16 AM

## 2010-11-03 NOTE — Progress Notes (Signed)
Summary: ? About recall Colonoscopy & Chemo   Phone Note Call from Patient Call back at Home Phone [phone]   Caller: Patient Summary of Call: patient is doing chemo treatments for lung cancer that has mets to her bones. She wants to know if she should have her colonoscopy while doing chemo, she has a hx of adenomatous colon polyps in 2008. Initial call taken by: Harlow Mares CMA Duncan Dull),  April 05, 2010 2:08 PM  Follow-up for Phone Call        NO. COMPLETE CHEMO. WOULD NOT PLAN COLONOSCOPY UNTIL ONCOLOGIST HAS DECLARED HER CANCER FREE. [facility] Follow-up by: Hilarie Fredrickson MD,  April 05, 2010 2:43 PM  Additional Follow-up for Phone Call Additional follow up Details #1::        Message left to call back   Teryl Lucy RN  April 05, 2010 3:48 PM Discussed Dr.Perry's recommendations FI:[id number] with pt. Additional Follow-up by: Teryl Lucy RN,  April 06, 2010 9:09 AM

## 2010-11-03 NOTE — Letter (Signed)
Summary: NP Consult/[facility]  NP Consult/[facility]   Imported By: Sherian Rein 03/28/2010 10:40:42  _____________________________________________________________________  External Attachment:    Type:   Image     Comment:   External Document

## 2010-11-03 NOTE — Letter (Signed)
Summary:  Cancer Center  [facility]   Imported By: Lester [place] 10/14/2010 09:35:58  _____________________________________________________________________  External Attachment:    Type:   Image     Comment:   External Document

## 2010-11-03 NOTE — Letter (Signed)
[facility]   Imported By: Sherian Rein 02/08/2010 14:20:23  _____________________________________________________________________  External Attachment:    Type:   Image     Comment:   External Document

## 2010-11-03 NOTE — Letter (Signed)
[facility]   Imported By: Sherian Rein 05/04/2010 10:17:15  _____________________________________________________________________  External Attachment:    Type:   Image     Comment:   External Document

## 2010-11-03 NOTE — Assessment & Plan Note (Signed)
Summary: OV W/MED REFILLS PER PT--STC   Vital Signs:  Patient profile:   70 year old female Height:      65 inches Weight:      143 pounds BMI:     23.88 O2 Sat:      97 % on Room air Temp:     98.6 degrees F oral Pulse rate:   65 / minute BP sitting:   102 / 60  (left arm) Cuff size:   regular  Vitals Entered By: Zella Ball Ewing CMA (AAMA) (June 29, 2010 2:02 PM)  O2 Flow:  Room air  CC: medication refills/RE   CC:  medication refills/RE.  History of Present Illness: here to f/u - now overall pain free, now finishing the induction chemo with last one for next wed, then maint chemo q 3 wks after that;  Pt denies CP, worsening sob, doe, wheezing, orthopnea, pnd, worsening LE edema, palps, dizziness or syncope  Pt denies new neuro symptoms such as headache, facial or extremity weakness  Denies new onset polydipsia or poluria. No fever, wt loss, loss of appetite or other constitutional symptoms , but has occasional night sweats. Overall she is quite pleased with her tx so far for the malignancy and how she has responded .  Has some alopecia and wears wig today.  Denies signfiicant depressive symtpoms, suicidal ideation or panic.   Incidently with mild lower mid abd tender and recnet UA with her pre-visit labs c/w UTI - no back pain, n/v, high fever, chills or singificant weakness.   Does cont to have signfiicant insomnina with diffictuly getting to sleep at night.  She lives alone, but has good social support system.    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) 3)  Constipation, Chronic  (ICD-564.09) 4)  Fatigue  (ICD-780.79) 5)  Anxiety  (ICD-300.00) 6)  Insomnia-sleep Disorder-unspec  (ICD-780.52) 7)  Preventive Health Care  (ICD-V70.0) 8)  Back Pain  (ICD-724.5) 9)  Depressive Disorder  (ICD-311) 10)  Uti  (ICD-599.0) 11)  Preventive Health Care  (ICD-V70.0) 12)  Lung Cancer, Hx of  (ICD-V10.11) 13)  Preoperative  Examination  (ICD-V72.84) 14)  Swelling, Mass, or Lump in Chest  (ICD-786.6) 15)  Allergic Rhinitis  (ICD-477.9) 16)  Osteoporosis  (ICD-733.00) 17)  Hypertension  (ICD-401.9) 18)  Hyperlipidemia  (ICD-272.4)  Medications Prior to Update: 1)  Diovan Hct 80-12.5 Mg  Tabs (Valsartan-Hydrochlorothiazide) .... 1/2 By Mouth Once Daily 2)  Simvastatin 20 Mg Tabs (Simvastatin) .[name].. 1po Once Daily 3)  Asa 81 Mg .[name].. 1 By Mouth Qd 4)  Calcium W/d .[name].. 600 Mg 1 By Mouth Bid 5)  B12 .[name].. 1 By Mouth Qd 6)  Cvs Vitamin E 400 Unit  Caps (Vitamin E) .... Once Daily 7)  Omega-3 350 Mg  Caps (Omega-3 Fatty Acids) .... Once Daily 8)  Antioxidant A/c/e   Tabs (Multiple Vitamin) .... Once Daily 9)  Ambien Cr 12.5 Mg Cr-Tabs (Zolpidem Tartrate) .[name].. 1 By Mouth At Bedtime As Needed 10)  Gabapentin 100 Mg Caps (Gabapentin) .... 2  By Mouth Once Daily 11)  Zolpidem Tartrate 12.5 Mg Cr-Tabs (Zolpidem Tartrate) .[name].. 1po At Bedtime As Needed  - Please Make Return Office Visit For Further Refills 12)  Oxycontin 20 Mg Xr12h-Tab (Oxycodone Hcl) .[name].. 1po Two Times A Day As Needed Pain 13)  Oxycodone Hcl 10 Mg Tabs (Oxycodone Hcl) .[name].. 1 By Mouth Q 6 Hrs As Needed Breakthrough Pain  14)  Colace 100 Mg Caps (Docusate Sodium) .[name].. 1 By Mouth Two Times A Day 15)  Miralax  Powd (Polyethylene Glycol 3350) .... Use Asd Once Daily 16)  Zometa 4 Mg/57ml Conc (Zoledronic Acid)  Current Medications (verified): 1)  Diovan Hct 80-12.5 Mg  Tabs (Valsartan-Hydrochlorothiazide) .... 1/2 By Mouth Once Daily 2)  Simvastatin 20 Mg Tabs (Simvastatin) .[name].. 1po Once Daily 3)  Asa 81 Mg .[name].. 1 By Mouth Qd 4)  Calcium W/d .[name].. 600 Mg 1 By Mouth Bid 5)  B12 .[name].. 1 By Mouth Qd 6)  Cvs Vitamin E 400 Unit  Caps (Vitamin E) .... Once Daily 7)  Omega-3 350 Mg  Caps (Omega-3 Fatty Acids) .... Once Daily 8)  Antioxidant A/c/e   Tabs (Multiple Vitamin) .... Once Daily 9)  Ambien Cr 12.5 Mg Cr-Tabs (Zolpidem Tartrate) .[name].. 1 By Mouth At Bedtime As  Needed 10)  Gabapentin 100 Mg Caps (Gabapentin) .... 2  By Mouth Once Daily 11)  Zolpidem Tartrate 12.5 Mg Cr-Tabs (Zolpidem Tartrate) .[name].. 1po At Bedtime As Needed  - Please Make Return Office Visit For Further Refills 12)  Oxycontin 20 Mg Xr12h-Tab (Oxycodone Hcl) .[name].. 1po Two Times A Day As Needed Pain 13)  Oxycodone Hcl 10 Mg Tabs (Oxycodone Hcl) .[name].. 1 By Mouth Q 6 Hrs As Needed Breakthrough Pain 14)  Colace 100 Mg Caps (Docusate Sodium) .[name].. 1 By Mouth Two Times A Day 15)  Miralax  Powd (Polyethylene Glycol 3350) .... Use Asd Once Daily 16)  Zometa 4 Mg/34ml Conc (Zoledronic Acid) 17)  Ciprofloxacin Hcl 500 Mg Tabs (Ciprofloxacin Hcl) .[name].. 1po Two Times A Day  Allergies (verified): 1)  ! Pcn 2)  ! Amoxicillin  Past History:  Past Medical History: Last updated: 05/13/2009 Hyperlipidemia bilat knee djd Hypertension Osteoporosis Allergic rhinitis Lung cancer, hx of - s/p CMT Anxiety  Past Surgical History: Last updated: 05/06/2008 Tubal ligation s/p LUL lung surgury 3/09  Family History: Last updated: 11/05/2007 ALS - father heart disease - mother son with ETOH problem  Social History: Last updated: 06/29/2010 Never Smoked Alcohol use-yes Divorced 1 son accountant - currently on disability  - last worked june22, 2011, plans to retire oct 14 Drug use-no  Risk Factors: Smoking Status: never (11/05/2007)  Social History: Reviewed history from 02/25/2010 and no changes required. Never Smoked Alcohol use-yes Divorced 1 son accountant - currently on disability  - last worked june22, 2011, plans to retire oct 14 Drug use-no  Review of Systems  The patient denies anorexia, fever, vision loss, decreased hearing, hoarseness, chest pain, syncope, dyspnea on exertion, peripheral edema, prolonged cough, headaches, hemoptysis, melena, hematochezia, severe indigestion/heartburn, hematuria, muscle weakness, suspicious skin lesions, transient blindness, difficulty  walking, depression, unusual weight change, abnormal bleeding, enlarged lymph nodes, and angioedema.         all otherwise negative per pt -     Physical Exam  General:  alert and well-developed.   Head:  normocephalic and atraumatic.   Eyes:  vision grossly intact, pupils equal, and pupils round.   Ears:  R ear normal and L ear normal.   Nose:  no external deformity and no nasal discharge.   Mouth:  no gingival abnormalities and pharynx pink and moist.   Neck:  supple and no masses.   Lungs:  normal respiratory effort and normal breath sounds.   Heart:  normal rate and regular rhythm.   Abdomen:  soft and normal bowel sounds but with mild low mid abd tender withoug  guarding or rebound.   Msk:  no joint tenderness and no joint swelling.   Extremities:  no edema, no erythema  Neurologic:  cranial nerves II-XII intact and strength normal in all extremities.   Skin:  color normal and no rashes.   Psych:  not depressed appearing and slightly anxious.     Impression & Recommendations:  Problem # 1:  Preventive Health Care (ICD-V70.0) Overall doing well, age appropriate education and counseling updated and referral for appropriate preventive services done unless declined, immunizations up to date or declined, diet counseling done if overweight, urged to quit smoking if smokes , most recent labs reviewed and current ordered if appropriate, ecg reviewed or declined (interpretation per ECG scanned in the EMR if done); information regarding Medicare Prevention requirements given if appropriate; speciality referrals updated as appropriate   Problem # 2:  INSOMNIA-SLEEP DISORDER-UNSPEC (ICD-780.52)  Her updated medication list for this problem includes:    Ambien Cr 12.5 Mg Cr-tabs (Zolpidem tartrate) .[name]... 1 by mouth at bedtime as needed    Zolpidem Tartrate 12.5 Mg Cr-tabs (Zolpidem tartrate) .[name]... 1po at bedtime as needed  - please make return office visit for further refills treat as above,  f/u any worsening signs or symptoms   Problem # 3:  UTI (ICD-599.0)  Her updated medication list for this problem includes:    Ciprofloxacin Hcl 500 Mg Tabs (Ciprofloxacin hcl) .[name]... 1po two times a day treat as above, f/u any worsening signs or symptoms   Problem # 4:  HYPERTENSION (ICD-401.9)  Her updated medication list for this problem includes:    Diovan Hct 80-12.5 Mg Tabs (Valsartan-hydrochlorothiazide) .[name]... 1/2 by mouth once daily  BP today: 102/60 Prior BP: 112/64 (02/25/2010)  Labs Reviewed: K+: 4.1 (05/13/2009) Creat: : 0.8 (05/13/2009)   Chol: 196 (05/13/2009)   HDL: 68.40 (05/13/2009)   LDL: 108 (05/13/2009)   TG: 100.0 (05/13/2009) stable overall by hx and exam, ok to continue meds/tx as is   Problem # 5:  ADENOCARCINOMA, LEFT LUNG, METASTATIC (ICD-162.9) to f/ju Dr Arbutus Ped as planned  Complete Medication List: 1)  Diovan Hct 80-12.5 Mg Tabs (Valsartan-hydrochlorothiazide) .... 1/2 by mouth once daily 2)  Simvastatin 20 Mg Tabs (Simvastatin) .[name].. 1po once daily 3)  Asa 81 Mg  .Marland KitchenMarland Kitchen. 1 by mouth qd 4)  Calcium W/d  .[name].. 600 mg 1 by mouth bid 5)  B12  .Marland KitchenMarland Kitchen. 1 by mouth qd 6)  Cvs Vitamin E 400 Unit Caps (Vitamin e) .... Once daily 7)  Omega-3 350 Mg Caps (Omega-3 fatty acids) .... Once daily 8)  Antioxidant A/c/e Tabs (Multiple vitamin) .... Once daily 9)  Ambien Cr 12.5 Mg Cr-tabs (Zolpidem tartrate) .[name].. 1 by mouth at bedtime as needed 10)  Gabapentin 100 Mg Caps (Gabapentin) .... 2  by mouth once daily 11)  Zolpidem Tartrate 12.5 Mg Cr-tabs (Zolpidem tartrate) .[name].. 1po at bedtime as needed  - please make return office visit for further refills 12)  Oxycontin 20 Mg Xr12h-tab (Oxycodone hcl) .[name].. 1po two times a day as needed pain 13)  Oxycodone Hcl 10 Mg Tabs (Oxycodone hcl) .[name].. 1 by mouth q 6 hrs as needed breakthrough pain 14)  Colace 100 Mg Caps (Docusate sodium) .[name].. 1 by mouth two times a day 15)  Miralax Powd (Polyethylene glycol 3350) .... Use asd once  daily 16)  Zometa 4 Mg/26ml Conc (Zoledronic acid) 17)  Ciprofloxacin Hcl 500 Mg Tabs (Ciprofloxacin hcl) .[name].. 1po two times a day  Patient Instructions: 1)  Please take all new medications as prescribed - the antibiotic 2)  Continue all previous medications as before this visit 3)  Please ask Dr Shirline Frees about getting the flu shot 4)  see Diovan [email or web address] for the card to save money 5)  please call for your yearly mammogram 6)  please make nurse visit for flu shot if you want it later 7)  your lab work was faxed to Dr Arbutus Ped 8)  Please schedule a follow-up appointment in 1 year or sooner if needed  Prescriptions: CIPROFLOXACIN HCL 500 MG TABS (CIPROFLOXACIN HCL) 1po two times a day  #20 x 0   Entered and Authorized by:   Corwin Levins MD   Signed by:   Corwin Levins MD on 06/29/2010   Method used:   Print then Give to Patient   RxID:   [id number] AMBIEN CR 12.5 MG CR-TABS (ZOLPIDEM TARTRATE) 1 by mouth at bedtime as needed  #90 x 1   Entered and Authorized by:   Corwin Levins MD   Signed by:   Corwin Levins MD on 06/29/2010   Method used:   Print then Give to Patient   RxID:   [id number]

## 2010-11-03 NOTE — Progress Notes (Signed)
Summary: PA-Zolpidem  Phone Note From Pharmacy   Summary of Call: PA-Zolpidem , called [employer] @ [phone], received form. Tamara Ortiz  August 17, 2010 3:41 PM  Faxed PA to [facility] @ [phone], awaiting, approval. Tamara Ortiz  August 18, 2010 10:34 AM  PA approved 08/23/10 until date eligibility on this policy ends, pt aware. Initial call taken by: Tamara Ortiz,  August 30, 2010 9:20 AM  Follow-up for Phone Call        Pt call requesting status of prior auth on her zolipidem. Let pt know md has recieved form and they was fax back to [place]. Just waiting to her back from insurance company for approval. Pls call pt once recieved authorization Follow-up by: Orlan Leavens RMA,  August 19, 2010 3:45 PM

## 2010-11-03 NOTE — Progress Notes (Signed)
Summary: Call Report  Phone Note Other Incoming   Caller: [facility] Summary of Call: [facility] Triage Call Report Triage Record Num: [id number] Operator: Arline Asp Loftin Patient Name: Tamara Ortiz Call Date & Time: 09/17/2010 10:46:43AM Patient Phone: [phone] PCP: [name] Patient Gender: Female PCP Fax : [phone] Patient DOB: 07-Mar-1941 Practice Name: [facility] Reason for Call: Pt with congestion/pain over sinuses, onset 12/15. Afebrile. Has chemo scheduled 12/19. Has not taken any Tylenol per oncologist's request, and "is not sure if she can take Ibuprofen." Emergent sx negative. Care advice per "Upper Respiratory Infection Adult Protocol." Transferred to office for appt on 12/17 per request. Call back parameters reviewed. Protocol(s) Used: Upper Respiratory Infection (URI) Recommended Outcome per Protocol: See Provider within 24 hours Reason for Outcome: New onset of eye redness, irritation/foreign body sensation or gritty feeling with yellow/green drainage Care Advice: Call provider if symptoms get worse, or you develop increasing eye pain, changes in vision, or blisters or sores on eye or insides of eyelids.  ~  ~ Apply a warm compress to the eye(s) four times a day for 10 to 15 minutes.  ~ Don't use eye makeup or wear contact lenses until there have been no symptoms for at least 24 hours. SEE PROVIDER WITHIN 4 HOURS if eyelid or area surrounding eye is red, warm, or tender.  ~ 12/ Initial call taken by: Margaret Pyle, CMA,  September 19, 2010 9:18 AM

## 2010-11-03 NOTE — Letter (Signed)
Summary: [facility]  [facility]   Imported By: Sherian Rein 09/16/2010 10:57:41  _____________________________________________________________________  External Attachment:    Type:   Image     Comment:   External Document

## 2010-11-03 NOTE — Progress Notes (Signed)
Summary: Labs  Phone Note Call from Patient Call back at [facility] Phone [phone]   Caller: Patient Summary of Call: Pt called stating that she has appt tomorrow for CPX and she was advised to have Cholesterol recheck. Pt is requesting to have labs done prior to appt at 2p. Okay to order? Initial call taken by: Margaret Pyle, CMA,  June 28, 2010 9:22 AM  Follow-up for Phone Call        ok for cpx labs - v70.0 which includes the cholesterol Follow-up by: Corwin Levins MD,  June 28, 2010 12:31 PM  Additional Follow-up for Phone Call Additional follow up Details #1::        CPX labs scheduled, pt informed. Additional Follow-up by: Margaret Pyle, CMA,  June 28, 2010 12:40 PM

## 2010-11-03 NOTE — Progress Notes (Signed)
Summary: ALT med  Phone Note Call from Patient Call back at [facility] Phone [phone]   Caller: Patient Summary of Call: Pt called stating that she has had a reaction to Cipro (redness and itching different parts of the body). Pt is requesting an alternative to [facility] [address]. Initial call taken by: Margaret Pyle, CMA,  June 30, 2010 1:56 PM  Follow-up for Phone Call        done per emr Follow-up by: Corwin Levins MD,  June 30, 2010 2:08 PM  Additional Follow-up for Phone Call Additional follow up Details #1::        pt informed Additional Follow-up by: Margaret Pyle, CMA,  June 30, 2010 2:14 PM   New Allergies: ! CIPRO New/Updated Medications: NITROFURANTOIN MACROCRYSTAL 100 MG CAPS (NITROFURANTOIN MACROCRYSTAL) 1po two times a day New Allergies: ! CIPROPrescriptions: NITROFURANTOIN MACROCRYSTAL 100 MG CAPS (NITROFURANTOIN MACROCRYSTAL) 1po two times a day  #20 x 0   Entered and Authorized by:   Corwin Levins MD   Signed by:   Corwin Levins MD on 06/30/2010   Method used:   Electronically to        [facility] [id number] (retail)       [address] [place], [place]  [address]       Ph: [phone]       Fax: [phone]   RxID:   [id number]

## 2010-11-03 NOTE — Progress Notes (Signed)
  Phone Note Refill Request Message from:  Fax from Pharmacy on May 31, 2010 9:08 AM  Refills Requested: Medication #1:  DIOVAN HCT 80-12.5 MG  TABS 1/2 by mouth once daily   Dosage confirmed as above?Dosage Confirmed   Notes: [facility] Initial call taken by: Robin Ewing CMA Duncan Dull),  May 31, 2010 9:09 AM    Prescriptions: DIOVAN HCT 80-12.5 MG  TABS (VALSARTAN-HYDROCHLOROTHIAZIDE) 1/2 by mouth once daily  #45 x 3   Entered by:   Zella Ball Ewing CMA (AAMA)   Authorized by:   Corwin Levins MD   Signed by:   Scharlene Gloss CMA (AAMA) on 05/31/2010   Method used:   Faxed to ...       [address] [facility] (mail-order)       [address]       [place], [place]  [address]       Ph: [phone]       Fax: [phone]   RxID:   [id number]

## 2010-11-03 NOTE — Progress Notes (Signed)
Summary: Referral  Phone Note Call from Patient Call back at Home Phone [phone]   Caller: Patient Summary of Call: Pt called stating she has scheduled a bone density and mammogram at [facility] on 12/22. Per hospital pt will need referral before studies are done, okay to put in referral? Initial call taken by: Margaret Pyle, CMA,  September 16, 2010 9:41 AM  Follow-up for Phone Call        done hardcopy to [facility] side B - dahlia  Follow-up by: Corwin Levins MD,  September 16, 2010 12:03 PM  Additional Follow-up for Phone Call Additional follow up Details #1::        Rx faxed to [facility] hospital imaging at [phone], pt advised Additional Follow-up by: Margaret Pyle, CMA,  September 16, 2010 1:19 PM

## 2010-11-03 NOTE — Letter (Signed)
Summary: [facility]  [facility]   Imported By: Sherian Rein 07/29/2010 12:31:29  _____________________________________________________________________  External Attachment:    Type:   Image     Comment:   External Document

## 2010-11-03 NOTE — Letter (Signed)
[facility]   Imported By: Sherian Rein 04/07/2010 11:27:02  _____________________________________________________________________  External Attachment:    Type:   Image     Comment:   External Document

## 2010-11-03 NOTE — Progress Notes (Signed)
Summary: Allergy  Phone Note Call from Patient Call back at Home Phone [phone]   Caller: Patient Summary of Call: pt called requeting we add to her chart that she is allergic to both PCN and Amoxicillian which caused her to go to UC 2 days ago. I called pt and left a message asking her tocall back and leava message on triage explaining the type of reaction she has so we can add to EMR. Initial call taken by: Margaret Pyle, CMA,  October 29, 2009 2:33 PM  Follow-up for Phone Call        Patient left message on triage that her allergic reaction was difficulty swallowing, hives, and itch. Updated EMR. Follow-up by: Lucious Groves,  October 29, 2009 3:58 PM     Appended Document: Allergy     Allergies Added: ! PCN ! AMOXICILLIN    Clinical Lists Changes  Allergies: Added new allergy or adverse reaction of PCN Added new allergy or adverse reaction of AMOXICILLIN Observations: Added new observation of NKA: F (10/29/2009 15:59) Added new observation of LLIMPORTALLS: completed (10/29/2009 15:59)

## 2010-11-03 NOTE — Letter (Signed)
Summary: Pre Visit Letter Revised  [facility]  [address] [place], [place] [address]   Phone: [phone]  Fax: [phone]        10/11/2010 MRN: [id number] [facility] [address] [place], [place]  [address]             Procedure Date:  11-07-10   Welcome to the Gastroenterology Division at [facility].    You are scheduled to see a nurse for your pre-procedure visit on 10-24-10 at 1:30p.m. on the 3rd floor at [facility], [address]  We ask that you try to arrive at our office 15 minutes prior to your appointment time to allow for check-in.  Please take a minute to review the attached form.  If you answer "Yes" to one or more of the questions on the first page, we ask that you call the person listed at your earliest opportunity.  If you answer "No" to all of the questions, please complete the rest of the form and bring it to your appointment.    Your nurse visit will consist of discussing your medical and surgical history, your immediate family medical history, and your medications.   If you are unable to list all of your medications on the form, please bring the medication bottles to your appointment and we will list them.  We will need to be aware of both prescribed and over the counter drugs.  We will need to know exact dosage information as well.    Please be prepared to read and sign documents such as consent forms, a financial agreement, and acknowledgement forms.  If necessary, and with your consent, a friend or relative is welcome to sit-in on the nurse visit with you.  Please bring your insurance card so that we may make a copy of it.  If your insurance requires a referral to see a specialist, please bring your referral form from your primary care physician.  No co-pay is required for this nurse visit.     If you cannot keep your appointment, please call [phone] to cancel or reschedule prior to your appointment date.  This allows  [place] the opportunity to schedule an appointment for another patient in need of care.    Thank you for choosing [facility] for your medical needs.  We appreciate the opportunity to care for you.  Please visit [place] at our website  to learn more about our practice.  Sincerely, The Gastroenterology Division

## 2010-11-03 NOTE — Letter (Signed)
Summary: [facility]  [facility]   Imported By: Lester [place] 06/10/2010 07:22:18  _____________________________________________________________________  External Attachment:    Type:   Image     Comment:   External Document

## 2010-11-03 NOTE — Letter (Signed)
Summary: [facility]  [facility]   Imported By: Sherian Rein 08/11/2010 08:37:03  _____________________________________________________________________  External Attachment:    Type:   Image     Comment:   External Document

## 2010-11-03 NOTE — Progress Notes (Signed)
Summary: Order?  Phone Note Call from Patient Call back at [facility] Phone [phone]   Caller: Patient Summary of Call: Pt called requesting Rx for Diovan and Smvastatin be mailed to her home to submit to new pharmacy per Insurance. Pt is also requesting order for Bone Density scan already scheduled for 12/22 at [facility]. Initial call taken by: Margaret Pyle, CMA,  August 23, 2010 9:38 AM  Follow-up for Phone Call        ok - done hardcopy to LIM side B - dahlia   refills to robin Follow-up by: Corwin Levins MD,  August 23, 2010 10:01 AM  Additional Follow-up for Phone Call Additional follow up Details #1::        RXs mailed, bone density order faxed to [phone] Additional Follow-up by: Margaret Pyle, CMA,  August 23, 2010 10:27 AM    Prescriptions: SIMVASTATIN 20 MG TABS (SIMVASTATIN) 1po once daily  #90 x 3   Entered by:   Margaret Pyle, CMA   Authorized by:   Corwin Levins MD   Signed by:   Margaret Pyle, CMA on 08/23/2010   Method used:   Print then Mail to Patient   RxID:   [id number] DIOVAN HCT 80-12.5 MG  TABS (VALSARTAN-HYDROCHLOROTHIAZIDE) 1/2 by mouth once daily  #45 x 3   Entered by:   Margaret Pyle, CMA   Authorized by:   Corwin Levins MD   Signed by:   Margaret Pyle, CMA on 08/23/2010   Method used:   Print then Mail to Patient   RxID:   [id number]

## 2010-11-03 NOTE — Assessment & Plan Note (Signed)
Summary: SINUS INFECTION/DLO   Vital Signs:  Patient profile:   70 year old female Weight:      149 pounds Temp:     98.1 degrees F oral BP sitting:   110 / 70  (left arm)  Vitals Entered By: Doristine Devoid CMA (September 17, 2010 11:38 AM) CC: sinus infection    History of Present Illness: Patient seen with four-day history of sinus pressure left maxillary greater than right. No fever. Mild sore throat. Occasional dry cough. Increased body aches.  Took antihistamine without relief. Patient has recurrent lung cancer and scheduled for chemotherapy Monday.  History of frequent sinusitis in past.  Current Medications (verified): 1)  Diovan Hct 80-12.5 Mg  Tabs (Valsartan-Hydrochlorothiazide) .... 1/2 By Mouth Once Daily 2)  Simvastatin 20 Mg Tabs (Simvastatin) .[name].. 1po Once Daily 3)  Asa 81 Mg .[name].. 1 By Mouth Qd 4)  Calcium W/d .[name].. 600 Mg 1 By Mouth Bid 5)  B12 .[name].. 1 By Mouth Qd 6)  Cvs Vitamin E 400 Unit  Caps (Vitamin E) .... Once Daily 7)  Omega-3 350 Mg  Caps (Omega-3 Fatty Acids) .... Once Daily 8)  Antioxidant A/c/e   Tabs (Multiple Vitamin) .... Once Daily 9)  Ambien Cr 12.5 Mg Cr-Tabs (Zolpidem Tartrate) .[name].. 1 By Mouth At Bedtime As Needed 10)  Gabapentin 100 Mg Caps (Gabapentin) .... 2  By Mouth Once Daily 11)  Zolpidem Tartrate 12.5 Mg Cr-Tabs (Zolpidem Tartrate) .[name].. 1po At Bedtime As Needed 12)  Oxycontin 20 Mg Xr12h-Tab (Oxycodone Hcl) .[name].. 1po Two Times A Day As Needed Pain 13)  Oxycodone Hcl 10 Mg Tabs (Oxycodone Hcl) .[name].. 1 By Mouth Q 6 Hrs As Needed Breakthrough Pain 14)  Colace 100 Mg Caps (Docusate Sodium) .[name].. 1 By Mouth Two Times A Day 15)  Miralax  Powd (Polyethylene Glycol 3350) .... Use Asd Once Daily 16)  Zometa 4 Mg/43ml Conc (Zoledronic Acid) 17)  Nitrofurantoin Macrocrystal 100 Mg Caps (Nitrofurantoin Macrocrystal) .[name].. 1po Two Times A Day  Allergies (verified): 1)  ! Pcn 2)  ! Amoxicillin 3)  ! Cipro  Past History:  Past Medical History: Last  updated: 05/13/2009 Hyperlipidemia bilat knee djd Hypertension Osteoporosis Allergic rhinitis Lung cancer, hx of - s/p CMT Anxiety PMH reviewed for relevance  Review of Systems  The patient denies anorexia, fever, chest pain, syncope, dyspnea on exertion, prolonged cough, and headaches.    Physical Exam  General:  Well-developed,well-nourished,in no acute distress; alert,appropriate and cooperative throughout examination Ears:  External ear exam shows no significant lesions or deformities.  Otoscopic examination reveals clear canals, tympanic membranes are intact bilaterally without bulging, retraction, inflammation or discharge. Hearing is grossly normal bilaterally. Mouth:  Oral mucosa and oropharynx without lesions or exudates.  Teeth in good repair. Neck:  No deformities, masses, or tenderness noted. Lungs:  Normal respiratory effort, chest expands symmetrically. Lungs are clear to auscultation, no crackles or wheezes. Heart:  normal rate and regular rhythm.     Impression & Recommendations:  Problem # 1:  UPPER RESPIRATORY INFECTION (ICD-465.9) suspect viral but pt high risk for complication with cancer and chemotherapy.  Wrote script for Z-max to start if any perisistent or worsening symptoms.  Complete Medication List: 1)  Diovan Hct 80-12.5 Mg Tabs (Valsartan-hydrochlorothiazide) .... 1/2 by mouth once daily 2)  Simvastatin 20 Mg Tabs (Simvastatin) .[name].. 1po once daily 3)  Asa 81 Mg  .Marland KitchenMarland Kitchen. 1 by mouth qd 4)  Calcium W/d  .[name].. 600 mg 1 by mouth bid  5)  B12  .Marland KitchenMarland Kitchen. 1 by mouth qd 6)  Cvs Vitamin E 400 Unit Caps (Vitamin e) .... Once daily 7)  Omega-3 350 Mg Caps (Omega-3 fatty acids) .... Once daily 8)  Antioxidant A/c/e Tabs (Multiple vitamin) .... Once daily 9)  Ambien Cr 12.5 Mg Cr-tabs (Zolpidem tartrate) .[name].. 1 by mouth at bedtime as needed 10)  Gabapentin 100 Mg Caps (Gabapentin) .... 2  by mouth once daily 11)  Zolpidem Tartrate 12.5 Mg Cr-tabs (Zolpidem tartrate) .[name]..  1po at bedtime as needed 12)  Oxycontin 20 Mg Xr12h-tab (Oxycodone hcl) .[name].. 1po two times a day as needed pain 13)  Oxycodone Hcl 10 Mg Tabs (Oxycodone hcl) .[name].. 1 by mouth q 6 hrs as needed breakthrough pain 14)  Colace 100 Mg Caps (Docusate sodium) .[name].. 1 by mouth two times a day 15)  Miralax Powd (Polyethylene glycol 3350) .... Use asd once daily 16)  Zometa 4 Mg/72ml Conc (Zoledronic acid) 17)  Nitrofurantoin Macrocrystal 100 Mg Caps (Nitrofurantoin macrocrystal) .[name].. 1po two times a day 18)  Azithromycin 250 Mg Tabs (Azithromycin) .... 2 by mouth today then one by mouth once daily for 4 days  Prescriptions: AZITHROMYCIN 250 MG TABS (AZITHROMYCIN) 2 by mouth today then one by mouth once daily for 4 days  #6 x 0   Entered and Authorized by:   Evelena Peat MD   Signed by:   Evelena Peat MD on 09/17/2010   Method used:   Print then Give to Patient   RxID:   [id number]    Orders Added: 1)  Est. Patient Level III [14782]

## 2010-11-03 NOTE — Medication Information (Signed)
Summary: Approved Zolpidem Tartrate / [employer]  Approved Zolpidem Tartrate / [employer]   Imported By: Lennie Odor 09/01/2010 10:21:47  _____________________________________________________________________  External Attachment:    Type:   Image     Comment:   External Document

## 2010-11-03 NOTE — Letter (Signed)
[facility]   Imported By: Sherian Rein 05/18/2010 08:22:27  _____________________________________________________________________  External Attachment:    Type:   Image     Comment:   External Document

## 2010-11-03 NOTE — Letter (Signed)
Summary: [facility]  [facility]   Imported By: Lennie Odor 07/05/2010 09:55:45  _____________________________________________________________________  External Attachment:    Type:   Image     Comment:   External Document

## 2010-11-03 NOTE — Letter (Signed)
[facility]   Imported By: Lester [place] 11/09/2009 10:17:25  _____________________________________________________________________  External Attachment:    Type:   Image     Comment:   External Document

## 2010-11-03 NOTE — Letter (Signed)
[facility]   Imported By: Sherian Rein 05/04/2010 10:18:09  _____________________________________________________________________  External Attachment:    Type:   Image     Comment:   External Document

## 2010-11-03 NOTE — Letter (Signed)
Summary: Si Gaul MD/[facility]  [facility] MD/[facility]   Imported By: Lester [place] 10/26/2010 10:02:11  _____________________________________________________________________  External Attachment:    Type:   Image     Comment:   External Document

## 2010-11-03 NOTE — Letter (Signed)
[facility]   Imported By: Sherian Rein 02/16/2010 11:17:33  _____________________________________________________________________  External Attachment:    Type:   Image     Comment:   External Document

## 2010-11-03 NOTE — Letter (Signed)
Summary: Bayard Males MD  Bayard Males MD   Imported By: Lester [place] 04/21/2010 09:47:32  _____________________________________________________________________  External Attachment:    Type:   Image     Comment:   External Document

## 2010-11-03 NOTE — Assessment & Plan Note (Signed)
Summary: PAIN--STC   Vital Signs:  Patient profile:   70 year old female Height:      65.5 inches Weight:      142.38 pounds BMI:     23.42 O2 Sat:      98 % on Room air Temp:     97.5 degrees F oral Pulse rate:   56 / minute BP sitting:   112 / 64  (left arm) Cuff size:   regular  Vitals Entered ByZella Ball Ewing (Feb 25, 2010 2:49 PM)  O2 Flow:  Room air CC: Requesting pain medication/RE   CC:  Requesting pain medication/RE.  History of Present Illness: here with 1 wk gradually worsening dull but now mod to severe pain to the lower back and pelvis area where she is known to have proven malignant involvement;  has had some mild wt loss, lower appetite, but no recent worsening constipation recetnly and has been able to stop the [place].  Pain overall seems worse at night, tends to be busy during the day and less noticeable it seems.  She has been offered XRT to the area, and is currently awaiting second opinion visit june7.  Pt denies CP, sob, doe, wheezing, orthopnea, pnd, worsening LE edema, palps, dizziness or syncope   Pt denies new neuro symptoms such as headache, facial or extremity weakness   No bowel or bladder changes, No LE pain, weakness,  numbness, gait change, falls.  Has had several night sweats in the past wk as well.  Has experience with oxycontin 20 mg after prior lung surgury - LUL  Preventive Screening-Counseling & Management      Drug Use:  no.    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) 2)  Constipation, Chronic  (ICD-564.09) 3)  Fatigue  (ICD-780.79) 4)  Anxiety  (ICD-300.00) 5)  Insomnia-sleep Disorder-unspec  (ICD-780.52) 6)  Preventive Health Care  (ICD-V70.0) 7)  Back Pain  (ICD-724.5) 8)  Depressive Disorder  (ICD-311) 9)  Uti  (ICD-599.0) 10)  Preventive Health Care  (ICD-V70.0) 11)  Lung Cancer, Hx of  (ICD-V10.11) 12)  Preoperative Examination  (ICD-V72.84) 13)  Swelling, Mass, or Lump in Chest   (ICD-786.6) 14)  Allergic Rhinitis  (ICD-477.9) 15)  Osteoporosis  (ICD-733.00) 16)  Hypertension  (ICD-401.9) 17)  Hyperlipidemia  (ICD-272.4)  Medications Prior to Update: 1)  Diovan Hct 80-12.5 Mg  Tabs (Valsartan-Hydrochlorothiazide) .... 1/2 By Mouth Once Daily 2)  Zocor 40 Mg  Tabs (Simvastatin) .[name].. 1 By Mouth Once Daily 3)  Asa 81 Mg .[name].. 1 By Mouth Qd 4)  Calcium W/d .[name].. 600 Mg 1 By Mouth Bid 5)  B12 .[name].. 1 By Mouth Qd 6)  Cvs Vitamin E 400 Unit  Caps (Vitamin E) .... Once Daily 7)  Omega-3 350 Mg  Caps (Omega-3 Fatty Acids) .... Once Daily 8)  Antioxidant A/c/e   Tabs (Multiple Vitamin) .... Once Daily 9)  Ambien Cr 12.5 Mg Cr-Tabs (Zolpidem Tartrate) .[name].. 1 By Mouth At Bedtime As Needed 10)  Gabapentin 100 Mg Caps (Gabapentin) .... 2  By Mouth Once Daily 11)  Amitiza 24 Mcg Caps (Lubiprostone) .[name].. 1 By Mouth Once Daily 12)  Zolpidem Tartrate 12.5 Mg Cr-Tabs (Zolpidem Tartrate) .[name].. 1po At Bedtime As Needed  - Please Make Return Office Visit For Further Refills  Current Medications (verified): 1)  Diovan Hct 80-12.5 Mg  Tabs (Valsartan-Hydrochlorothiazide) .... 1/2 By Mouth Once Daily 2)  Simvastatin 20 Mg Tabs (Simvastatin) .[name].. 1po Once  Daily 3)  Asa 81 Mg .[name].. 1 By Mouth Qd 4)  Calcium W/d .[name].. 600 Mg 1 By Mouth Bid 5)  B12 .[name].. 1 By Mouth Qd 6)  Cvs Vitamin E 400 Unit  Caps (Vitamin E) .... Once Daily 7)  Omega-3 350 Mg  Caps (Omega-3 Fatty Acids) .... Once Daily 8)  Antioxidant A/c/e   Tabs (Multiple Vitamin) .... Once Daily 9)  Ambien Cr 12.5 Mg Cr-Tabs (Zolpidem Tartrate) .[name].. 1 By Mouth At Bedtime As Needed 10)  Gabapentin 100 Mg Caps (Gabapentin) .... 2  By Mouth Once Daily 11)  Zolpidem Tartrate 12.5 Mg Cr-Tabs (Zolpidem Tartrate) .[name].. 1po At Bedtime As Needed  - Please Make Return Office Visit For Further Refills 12)  Oxycontin 20 Mg Xr12h-Tab (Oxycodone Hcl) .[name].. 1po Two Times A Day As Needed Pain 13)  Oxycodone Hcl 10 Mg Tabs (Oxycodone Hcl) .[name].. 1 By Mouth Q  6 Hrs As Needed Breakthrough Pain 14)  Colace 100 Mg Caps (Docusate Sodium) .[name].. 1 By Mouth Two Times A Day 15)  Miralax  Powd (Polyethylene Glycol 3350) .... Use Asd Once Daily 16)  Zometa 4 Mg/55ml Conc (Zoledronic Acid)  Allergies (verified): 1)  ! Pcn 2)  ! Amoxicillin  Past History:  Past Medical History: Last updated: 05/13/2009 Hyperlipidemia bilat knee djd Hypertension Osteoporosis Allergic rhinitis Lung cancer, hx of - s/p CMT Anxiety  Past Surgical History: Last updated: 05/06/2008 Tubal ligation s/p LUL lung surgury 3/09  Social History: Last updated: 02/25/2010 Never Smoked Alcohol use-yes Divorced 1 son accountant Drug use-no  Risk Factors: Smoking Status: never (11/05/2007)  Social History: Reviewed history from 11/05/2007 and no changes required. Never Smoked Alcohol use-yes Divorced 1 son accountant Drug use-no Drug Use:  no  Review of Systems       all otherwise negative per pt -    Physical Exam  General:  alert and well-developed but has lost wt Head:  normocephalic and atraumatic.   Eyes:  vision grossly intact, pupils equal, and pupils round.   Ears:  R ear normal and L ear normal.   Nose:  no external deformity and no nasal discharge.   Mouth:  no gingival abnormalities and pharynx pink and moist.   Neck:  supple and no masses.   Lungs:  normal respiratory effort and normal breath sounds.   Heart:  normal rate and regular rhythm.   Abdomen:  soft, non-tender, and normal bowel sounds.   Msk:  no spine tender to palpate , or paravertebral spasm or tender Extremities:  no edema, no erythema  Neurologic:  strength normal in all lower extremities, sensation intact to light touch, and gait normal.   Psych:  not depressed appearing and slightly anxious.     Impression & Recommendations:  Problem # 1:  ADENOCARCINOMA, LEFT LUNG, METASTATIC (ICD-162.9) most recent cancer report reviewed with pt;  now pt with lower back pain presumed  bone pain, o/w neurologically intact  - for start pain meds, f/u rad onc as planned, f/u here as needed, to call for further refills ;  also to cont the zometa as planned  Problem # 2:  HYPERTENSION (ICD-401.9)  Her updated medication list for this problem includes:    Diovan Hct 80-12.5 Mg Tabs (Valsartan-hydrochlorothiazide) .[name]... 1/2 by mouth once daily with mild recent wt loss  - d/w pt  - to start half pill for BP lower than this at home,  if has  further wt loss  BP today: 112/64 Prior BP:  124/62 (05/13/2009)  Labs Reviewed: K+: 4.1 (05/13/2009) Creat: : 0.8 (05/13/2009)   Chol: 196 (05/13/2009)   HDL: 68.40 (05/13/2009)   LDL: 108 (05/13/2009)   TG: 100.0 (05/13/2009)  Problem # 3:  HYPERLIPIDEMIA (ICD-272.4)  Her updated medication list for this problem includes:    Simvastatin 20 Mg Tabs (Simvastatin) .[name]... 1po once daily ok to decrease given the wt loss and clinical situation  Labs Reviewed: SGOT: 18 (05/13/2009)   SGPT: 20 (05/13/2009)   HDL:68.40 (05/13/2009), 49.3 (05/04/2008)  LDL:108 (05/13/2009), 109 (05/04/2008)  Chol:196 (05/13/2009), 183 (05/04/2008)  Trig:100.0 (05/13/2009), 124 (05/04/2008)  Problem # 4:  CONSTIPATION, CHRONIC (ICD-564.09)  prior hx , now to start  narcotic tx , ok for colace two times a day, and miralax daily  Her updated medication list for this problem includes:    Colace 100 Mg Caps (Docusate sodium) .[name]... 1 by mouth two times a day    Miralax Powd (Polyethylene glycol 3350) ..... Use asd once daily  Complete Medication List: 1)  Diovan Hct 80-12.5 Mg Tabs (Valsartan-hydrochlorothiazide) .... 1/2 by mouth once daily 2)  Simvastatin 20 Mg Tabs (Simvastatin) .[name].. 1po once daily 3)  Asa 81 Mg  .Marland KitchenMarland Kitchen. 1 by mouth qd 4)  Calcium W/d  .[name].. 600 mg 1 by mouth bid 5)  B12  .Marland KitchenMarland Kitchen. 1 by mouth qd 6)  Cvs Vitamin E 400 Unit Caps (Vitamin e) .... Once daily 7)  Omega-3 350 Mg Caps (Omega-3 fatty acids) .... Once daily 8)  Antioxidant A/c/e Tabs  (Multiple vitamin) .... Once daily 9)  Ambien Cr 12.5 Mg Cr-tabs (Zolpidem tartrate) .[name].. 1 by mouth at bedtime as needed 10)  Gabapentin 100 Mg Caps (Gabapentin) .... 2  by mouth once daily 11)  Zolpidem Tartrate 12.5 Mg Cr-tabs (Zolpidem tartrate) .[name].. 1po at bedtime as needed  - please make return office visit for further refills 12)  Oxycontin 20 Mg Xr12h-tab (Oxycodone hcl) .[name].. 1po two times a day as needed pain 13)  Oxycodone Hcl 10 Mg Tabs (Oxycodone hcl) .[name].. 1 by mouth q 6 hrs as needed breakthrough pain 14)  Colace 100 Mg Caps (Docusate sodium) .[name].. 1 by mouth two times a day 15)  Miralax Powd (Polyethylene glycol 3350) .... Use asd once daily 16)  Zometa 4 Mg/68ml Conc (Zoledronic acid)  Patient Instructions: 1)  Please take all new medications as prescribed 2)  Continue all previous medications as before this visit  3)  please start colace 100 mg twice per day (or once per day if have loose stool) 4)  please start miralax OTC daily to pre-empt the constipation from getting worse 5)  you can take HALF of the diovan HCT if the Blood Pressure at home becomes less than what you have today 6)  decrease the zocor to 20 mg per day 7)  Please schedule a follow-up appointment in 3 months with CPX labs, or sooner if needed Prescriptions: SIMVASTATIN 20 MG TABS (SIMVASTATIN) 1po once daily  #90 x 3   Entered and Authorized by:   Corwin Levins MD   Signed by:   Corwin Levins MD on 02/25/2010   Method used:   Print then Give to Patient   RxID:   [id number] SIMVASTATIN 20 MG TABS (SIMVASTATIN) 1po once daily  #30 x 11   Entered and Authorized by:   Corwin Levins MD   Signed by:   Corwin Levins MD on 02/25/2010   Method used:   Print then Give  to Patient   RxID:   [id number] OXYCODONE HCL 10 MG TABS (OXYCODONE HCL) 1 by mouth q 6 hrs as needed breakthrough pain  #60 x 0   Entered and Authorized by:   Corwin Levins MD   Signed by:   Corwin Levins MD on 02/25/2010   Method used:    Print then Give to Patient   RxID:   [id number] OXYCONTIN 20 MG XR12H-TAB (OXYCODONE HCL) 1po two times a day as needed pain  #60 x 0   Entered and Authorized by:   Corwin Levins MD   Signed by:   Corwin Levins MD on 02/25/2010   Method used:   Print then Give to Patient   RxID:   [id number]

## 2010-11-03 NOTE — Letter (Signed)
Summary: Colonoscopy Letter  [facility]  [address] [place], [place] [address]   Phone: [phone]  Fax: [phone]      March 30, 2010 MRN: [id number]   Tamara Ortiz [address] [place], [place]  [address]   Dear Ms. Scicchitano,   According to your medical record, it is time for you to schedule a Colonoscopy. The American Cancer Society recommends this procedure as a method to detect early colon cancer. Patients with a family history of colon cancer, or a personal history of colon polyps or inflammatory bowel disease are at increased risk.  This letter has been generated based on the recommendations made at the time of your procedure. If you feel that in your particular situation this may no longer apply, please contact our office.  Please call our office at [phone] to schedule this appointment or to update your records at your earliest convenience.  Thank you for cooperating with [place] to provide you with the very best care possible.   Sincerely,  Wilhemina Bonito. Marina Goodell, M.D.  [facility] [phone]

## 2010-11-03 NOTE — Progress Notes (Signed)
  Phone Note Refill Request Message from:  Patient on August 16, 2010 1:41 PM  Refills Requested: Medication #1:  AMBIEN CR 12.5 MG CR-TABS 1 by mouth at bedtime as needed   Dosage confirmed as above?Dosage Confirmed   Supply Requested: 3 months [facility] [address]   Method Requested: Fax to Local Pharmacy Initial call taken by: Margaret Pyle, CMA,  August 16, 2010 1:41 PM    New/Updated Medications: ZOLPIDEM TARTRATE 12.5 MG CR-TABS (ZOLPIDEM TARTRATE) 1po at bedtime as needed Prescriptions: ZOLPIDEM TARTRATE 12.5 MG CR-TABS (ZOLPIDEM TARTRATE) 1po at bedtime as needed  #30 x 3   Entered and Authorized by:   Corwin Levins MD   Signed by:   Corwin Levins MD on 08/16/2010   Method used:   Print then Give to Patient   RxID:   [id number]  done hardcopy to LIM side B - dahlia Corwin Levins MD  August 16, 2010 2:39 PM   Rx faxed to pharmacy Margaret Pyle, CMA  August 16, 2010 2:57 PM

## 2010-11-07 ENCOUNTER — Other Ambulatory Visit: Payer: Self-pay | Admitting: Internal Medicine

## 2010-11-15 ENCOUNTER — Encounter (INDEPENDENT_AMBULATORY_CARE_PROVIDER_SITE_OTHER): Payer: Self-pay | Admitting: *Deleted

## 2010-11-17 ENCOUNTER — Telehealth: Payer: Self-pay | Admitting: Internal Medicine

## 2010-11-17 ENCOUNTER — Encounter: Payer: Self-pay | Admitting: Internal Medicine

## 2010-11-21 ENCOUNTER — Encounter: Payer: Self-pay | Admitting: Internal Medicine

## 2010-11-21 ENCOUNTER — Other Ambulatory Visit: Payer: Self-pay | Admitting: Internal Medicine

## 2010-11-21 ENCOUNTER — Encounter (HOSPITAL_BASED_OUTPATIENT_CLINIC_OR_DEPARTMENT_OTHER): Payer: Medicare PPO | Admitting: Internal Medicine

## 2010-11-21 DIAGNOSIS — Z5111 Encounter for antineoplastic chemotherapy: Secondary | ICD-10-CM

## 2010-11-21 DIAGNOSIS — R112 Nausea with vomiting, unspecified: Secondary | ICD-10-CM

## 2010-11-21 DIAGNOSIS — C3410 Malignant neoplasm of upper lobe, unspecified bronchus or lung: Secondary | ICD-10-CM

## 2010-11-21 DIAGNOSIS — C3490 Malignant neoplasm of unspecified part of unspecified bronchus or lung: Secondary | ICD-10-CM

## 2010-11-21 DIAGNOSIS — C7952 Secondary malignant neoplasm of bone marrow: Secondary | ICD-10-CM

## 2010-11-21 LAB — CBC WITH DIFFERENTIAL/PLATELET
Basophils Absolute: 0 10*3/uL (ref 0.0–0.1)
Eosinophils Absolute: 0 10*3/uL (ref 0.0–0.5)
LYMPH%: 2.8 % — ABNORMAL LOW (ref 14.0–49.7)
MCH: 34.1 pg — ABNORMAL HIGH (ref 25.1–34.0)
MCV: 97.6 fL (ref 79.5–101.0)
MONO%: 1.7 % (ref 0.0–14.0)
NEUT#: 8.2 10*3/uL — ABNORMAL HIGH (ref 1.5–6.5)
Platelets: 235 10*3/uL (ref 145–400)
RBC: 3.65 10*6/uL — ABNORMAL LOW (ref 3.70–5.45)

## 2010-11-21 LAB — COMPREHENSIVE METABOLIC PANEL
Alkaline Phosphatase: 54 U/L (ref 39–117)
BUN: 15 mg/dL (ref 6–23)
Glucose, Bld: 126 mg/dL — ABNORMAL HIGH (ref 70–99)
Sodium: 138 mEq/L (ref 135–145)
Total Bilirubin: 0.5 mg/dL (ref 0.3–1.2)

## 2010-11-23 NOTE — Letter (Signed)
Summary: Si Gaul MD/[facility]  [facility] MD/[facility]   Imported By: Lester Raysal 11/16/2010 10:35:44  _____________________________________________________________________  External Attachment:    Type:   Image     Comment:   External Document

## 2010-11-23 NOTE — Miscellaneous (Signed)
Summary: [facility] previsit  Clinical Lists Changes  Medications: Added new medication of MOVIPREP 100 GM  SOLR (PEG-KCL-NACL-NASULF-NA ASC-C) As per prep instructions. - Signed Rx of MOVIPREP 100 GM  SOLR (PEG-KCL-NACL-NASULF-NA ASC-C) As per prep instructions.;  #1 x 0;  Signed;  Entered by: Karl Bales RN;  Authorized by: Hilarie Fredrickson MD;  Method used: Electronically to [id number]*, [address] [place], [place]  [address], Ph: [phone], Fax: [phone]    Prescriptions: MOVIPREP 100 GM  SOLR (PEG-KCL-NACL-NASULF-NA ASC-C) As per prep instructions.  #1 x 0   Entered by:   Karl Bales RN   Authorized by:   Hilarie Fredrickson MD   Signed by:   Karl Bales RN on 11/17/2010   Method used:   Electronically to        [facility] [address] #[id number]* (retail)       [address] [place], [place]  [address]       Ph: [phone]       Fax: [phone]   RxID:   [id number]

## 2010-11-23 NOTE — Progress Notes (Signed)
Summary: update for MD   Phone Note Outgoing Call   Call placed by: Karl Bales RN,  November 17, 2010 11:26 AM Summary of Call: Dr. Marina Goodell, This patient came in for her PV today; her colonoscopy is 12-01-10.  I just wanted you to be aware that she is being treated with maintenance chemotherapy for lung cancer that has spread to her spine and bones.  She is not taking any pain mediations.  She has had her upper left lobe of her lung removed and states she is difficult to intubate.  I just wanted to make you aware before her procedure.  Thank you, Kristen Initial call taken by: Karl Bales RN,  November 17, 2010 11:29 AM  Follow-up for Phone Call         Baxter Hire ,      why  is she having the colonoscopy ? in general, people with advanced metastatic cancer would not be appropriate candidates for such.. Who sent her ? Follow-up by: Hilarie Fredrickson MD,  November 17, 2010 4:33 PM  Additional Follow-up for Phone Call Additional follow up Details #1::        Dr. Marina Goodell, It looks like Dr. Jonny Ruiz is her family MD.  She had a colonoscopy done in 2008 that showed polyps.  She received a letter from our office for her to have a follow up colonoscopy.  She is having no GI troubles Additional Follow-up by: Karl Bales RN,  November 17, 2010 5:15 PM    Additional Follow-up for Phone Call Additional follow up Details #2::    Given her issues as you outlined, I don't feel that a colonoscopy is appropriate at this time. Possibly never. Her oncologist can advise in this regard. Let her know and wish her well from [place]. When this note is completed and signed, forward to Dr Jonny Ruiz as a courtesy....thanks Follow-up by: Hilarie Fredrickson MD,  November 18, 2010 1:04 PM  Additional Follow-up for Phone Call Additional follow up Details #3:: Details for Additional Follow-up Action Taken: Above noted and spoke with pt.  Above explained and understanding voiced.  Writer told pt that if oncologist feels like  colonoscopy is necessary Dr. Marina Goodell would be happy to do procedure.  Pt states she will call if she has further needs Additional Follow-up by: Karl Bales RN,  November 18, 2010 1:35 PM

## 2010-11-23 NOTE — Letter (Signed)
Summary: [facility] Instructions  [facility]  [address] [place], [place] [address]   Phone: [phone]  Fax: [phone]       Tamara Ortiz    07-13-1941    MRN: [id number]        Procedure Day Dorna Bloom:  Lenor Coffin  12/01/10     Arrival Time:  9:30AM     Procedure Time:  10:30AM     Location of Procedure:                    _ X_  [facility] (4th Floor)  PREPARATION FOR COLONOSCOPY WITH MOVIPREP   Starting 5 days prior to your procedure 11/26/10 do not eat nuts, seeds, popcorn, corn, beans, peas,  salads, or any raw vegetables.  Do not take any fiber supplements (e.g. Metamucil, Citrucel, and Benefiber).  THE DAY BEFORE YOUR PROCEDURE         DATE: 11/30/10   DAY: WEDNESDAY  1.  Drink clear liquids the entire day-NO SOLID FOOD  2.  Do not drink anything colored red or purple.  Avoid juices with pulp.  No orange juice.  3.  Drink at least 64 oz. (8 glasses) of fluid/clear liquids during the day to prevent dehydration and help the prep work efficiently.  CLEAR LIQUIDS INCLUDE: Water Jello Ice Popsicles Tea (sugar ok, no milk/cream) Powdered fruit flavored drinks Coffee (sugar ok, no milk/cream) Gatorade Juice: apple, white grape, white cranberry  Lemonade Clear bullion, consomm, broth Carbonated beverages (any kind) Strained chicken noodle soup Hard Candy                             4.  In the morning, mix first dose of MoviPrep solution:    Empty 1 Pouch A and 1 Pouch B into the disposable container    Add lukewarm drinking water to the top line of the container. Mix to dissolve    Refrigerate (mixed solution should be used within 24 hrs)  5.  Begin drinking the prep at 5:00 p.m. The MoviPrep container is divided by 4 marks.   Every 15 minutes drink the solution down to the next mark (approximately 8 oz) until the full liter is complete.   6.  Follow completed prep with 16 oz of clear liquid of your choice (Nothing red or purple).   Continue to drink clear liquids until bedtime.  7.  Before going to bed, mix second dose of MoviPrep solution:    Empty 1 Pouch A and 1 Pouch B into the disposable container    Add lukewarm drinking water to the top line of the container. Mix to dissolve    Refrigerate  THE DAY OF YOUR PROCEDURE      DATE: 12/01/10   DAY: THURSDAY  Beginning at 5:30AM (5 hours before procedure):         1. Every 15 minutes, drink the solution down to the next mark (approx 8 oz) until the full liter is complete.  2. Follow completed prep with 16 oz. of clear liquid of your choice.    3. You may drink clear liquids until 8:30AM (2 HOURS BEFORE PROCEDURE).   MEDICATION INSTRUCTIONS  Unless otherwise instructed, you should take regular prescription medications with a small sip of water   as early as possible the morning of your procedure.          OTHER INSTRUCTIONS  You will need a responsible  adult at least 68 years of age to accompany you and drive you home.   This person must remain in the waiting room during your procedure.  Wear loose fitting clothing that is easily removed.  Leave jewelry and other valuables at home.  However, you may wish to bring a book to read or  an iPod/MP3 player to listen to music as you wait for your procedure to start.  Remove all body piercing jewelry and leave at home.  Total time from sign-in until discharge is approximately 2-3 hours.  You should go home directly after your procedure and rest.  You can resume normal activities the  day after your procedure.  The day of your procedure you should not:   Drive   Make legal decisions   Operate machinery   Drink alcohol   Return to work  You will receive specific instructions about eating, activities and medications before you leave.    The above instructions have been reviewed and explained to me by   Karl Bales RN  November 17, 2010 11:16 AM    I fully understand and can verbalize  these instructions _____________________________ Date _________

## 2010-12-01 ENCOUNTER — Other Ambulatory Visit: Payer: Self-pay | Admitting: Internal Medicine

## 2010-12-13 ENCOUNTER — Ambulatory Visit (HOSPITAL_COMMUNITY)
Admission: RE | Admit: 2010-12-13 | Discharge: 2010-12-13 | Disposition: A | Discharge status: Home / Self Care | Payer: Medicare PPO | Source: Ambulatory Visit | Attending: Internal Medicine | Admitting: Internal Medicine

## 2010-12-13 ENCOUNTER — Encounter (HOSPITAL_COMMUNITY): Payer: Self-pay

## 2010-12-13 DIAGNOSIS — H538 Other visual disturbances: Secondary | ICD-10-CM | POA: Insufficient documentation

## 2010-12-13 DIAGNOSIS — C7951 Secondary malignant neoplasm of bone: Secondary | ICD-10-CM | POA: Insufficient documentation

## 2010-12-13 DIAGNOSIS — Z79899 Other long term (current) drug therapy: Secondary | ICD-10-CM | POA: Insufficient documentation

## 2010-12-13 DIAGNOSIS — C3490 Malignant neoplasm of unspecified part of unspecified bronchus or lung: Secondary | ICD-10-CM

## 2010-12-13 DIAGNOSIS — C7952 Secondary malignant neoplasm of bone marrow: Secondary | ICD-10-CM | POA: Insufficient documentation

## 2010-12-13 HISTORY — DX: Essential (primary) hypertension: I10

## 2010-12-13 HISTORY — DX: Malignant (primary) neoplasm, unspecified: C80.1

## 2010-12-13 MED ORDER — IOHEXOL 300 MG/ML  SOLN
100.0000 mL | Freq: Once | INTRAMUSCULAR | Status: AC | PRN
  Administered 2010-12-13: 100 mL via INTRAVENOUS

## 2010-12-13 NOTE — Letter (Signed)
Summary: [facility]  [facility]   Imported By: Lennie Odor 12/06/2010 15:40:20  _____________________________________________________________________  External Attachment:    Type:   Image     Comment:   External Document

## 2010-12-14 ENCOUNTER — Telehealth: Payer: Self-pay | Admitting: Internal Medicine

## 2010-12-18 LAB — GLUCOSE, CAPILLARY: Glucose-Capillary: 94 mg/dL (ref 70–99)

## 2010-12-20 ENCOUNTER — Encounter (HOSPITAL_BASED_OUTPATIENT_CLINIC_OR_DEPARTMENT_OTHER): Payer: Medicare PPO | Admitting: Internal Medicine

## 2010-12-20 ENCOUNTER — Other Ambulatory Visit: Payer: Self-pay | Admitting: Internal Medicine

## 2010-12-20 DIAGNOSIS — C3410 Malignant neoplasm of upper lobe, unspecified bronchus or lung: Secondary | ICD-10-CM

## 2010-12-20 DIAGNOSIS — C7952 Secondary malignant neoplasm of bone marrow: Secondary | ICD-10-CM

## 2010-12-20 DIAGNOSIS — Z5111 Encounter for antineoplastic chemotherapy: Secondary | ICD-10-CM

## 2010-12-20 LAB — CBC WITH DIFFERENTIAL/PLATELET
BASO%: 0.6 % (ref 0.0–2.0)
Basophils Absolute: 0 10*3/uL (ref 0.0–0.1)
EOS%: 1 % (ref 0.0–7.0)
HCT: 36.2 % (ref 34.8–46.6)
HGB: 12.6 g/dL (ref 11.6–15.9)
LYMPH%: 36.7 % (ref 14.0–49.7)
MCH: 34 pg (ref 25.1–34.0)
MCHC: 34.8 g/dL (ref 31.5–36.0)
MCV: 97.6 fL (ref 79.5–101.0)
MONO%: 11.4 % (ref 0.0–14.0)
NEUT%: 50.3 % (ref 38.4–76.8)

## 2010-12-20 LAB — CBC
HCT: 38.2 % (ref 36.0–46.0)
Hemoglobin: 12.8 g/dL (ref 12.0–15.0)
MCV: 95.3 fL (ref 78.0–100.0)
RDW: 12.2 % (ref 11.5–15.5)

## 2010-12-20 LAB — COMPREHENSIVE METABOLIC PANEL
ALT: 33 U/L (ref 0–35)
AST: 32 U/L (ref 0–37)
Alkaline Phosphatase: 55 U/L (ref 39–117)
BUN: 12 mg/dL (ref 6–23)
Calcium: 9.7 mg/dL (ref 8.4–10.5)
Creatinine, Ser: 0.8 mg/dL (ref 0.40–1.20)
Total Bilirubin: 0.5 mg/dL (ref 0.3–1.2)

## 2010-12-20 LAB — APTT: aPTT: 28 seconds (ref 24–37)

## 2010-12-20 NOTE — Progress Notes (Signed)
Summary: Rx refill req  Phone Note Refill Request Message from:  Patient on December 14, 2010 4:01 PM  Refills Requested: Medication #1:  AMBIEN CR 12.5 MG CR-TABS 1 by mouth at bedtime as needed   Dosage confirmed as above?Dosage Confirmed   Supply Requested: 6 months   Notes: [facility]  Method Requested: Fax to Mail Away Pharmacy Initial call taken by: Margaret Pyle, CMA,  December 14, 2010 4:02 PM  Follow-up for Phone Call        done hardcopy to LIM side B - dahlia  Follow-up by: Corwin Levins MD,  December 14, 2010 5:28 PM  Additional Follow-up for Phone Call Additional follow up Details #1::        Rx faxed to mail order pharmacy Additional Follow-up by: Margaret Pyle, CMA,  December 15, 2010 8:00 AM    New/Updated Medications: ZOLPIDEM TARTRATE 12.5 MG CR-TABS (ZOLPIDEM TARTRATE) 1po at bedtime as needed Prescriptions: ZOLPIDEM TARTRATE 12.5 MG CR-TABS (ZOLPIDEM TARTRATE) 1po at bedtime as needed  #90 x 1   Entered and Authorized by:   Corwin Levins MD   Signed by:   Corwin Levins MD on 12/14/2010   Method used:   Print then Give to Patient   RxID:   [id number]

## 2010-12-20 NOTE — Progress Notes (Signed)
Summary: ALT med req  Phone Note Call from Patient Call back at [facility] Phone [phone]   Caller: Patient Summary of Call: Pt called stating she has changed insurance company to [facility] and has been advised that it would be cheaper for pt if Divan was changed to Lisinopril or Losartan. Pt is requesting MD advise and send 90 day Rx to [facility] mail order pharmacy. Initial call taken by: Margaret Pyle, CMA,  December 14, 2010 10:33 AM  Follow-up for Phone Call        ok to change diovanHCT  to losartanHCT 100/12.5 - 1 per day - to robin to handle per protocol Follow-up by: Corwin Levins MD,  December 14, 2010 12:28 PM  Additional Follow-up for Phone Call Additional follow up Details #1::        called pt. left msg. informed sent requested prescription to pharmacy Additional Follow-up by: Robin Ewing CMA Duncan Dull),  December 14, 2010 1:29 PM    New/Updated Medications: LOSARTAN POTASSIUM-HCTZ 100-12.5 MG TABS (LOSARTAN POTASSIUM-HCTZ) 1 by mouth once daily Prescriptions: LOSARTAN POTASSIUM-HCTZ 100-12.5 MG TABS (LOSARTAN POTASSIUM-HCTZ) 1 by mouth once daily  #90 x 2   Entered by:   Zella Ball Ewing CMA (AAMA)   Authorized by:   Corwin Levins MD   Signed by:   Scharlene Gloss CMA (AAMA) on 12/14/2010   Method used:   Faxed to ...       [facility]             , [place]         Ph:        Fax: [phone]   RxID:   [id number]

## 2010-12-22 ENCOUNTER — Other Ambulatory Visit: Payer: Self-pay

## 2010-12-22 MED ORDER — SIMVASTATIN 40 MG PO TABS: 40.0000 mg | ORAL_TABLET | Freq: Every evening | ORAL | Status: DC

## 2010-12-22 NOTE — Telephone Encounter (Signed)
Pt called requesting Rx for Simvastatin to [facility] pharmacy. Pt advised Rx sent

## 2010-12-28 ENCOUNTER — Ambulatory Visit (INDEPENDENT_AMBULATORY_CARE_PROVIDER_SITE_OTHER)
Admission: RE | Admit: 2010-12-28 | Discharge: 2010-12-28 | Disposition: A | Discharge status: Home / Self Care | Payer: Medicare PPO | Source: Ambulatory Visit | Attending: Internal Medicine | Admitting: Internal Medicine

## 2010-12-28 ENCOUNTER — Ambulatory Visit (INDEPENDENT_AMBULATORY_CARE_PROVIDER_SITE_OTHER): Payer: Medicare PPO | Admitting: Internal Medicine

## 2010-12-28 ENCOUNTER — Encounter: Payer: Self-pay | Admitting: Internal Medicine

## 2010-12-28 VITALS — BP 112/70 | HR 84 | Temp 99.3°F | Ht 65.0 in | Wt 148.2 lb

## 2010-12-28 DIAGNOSIS — I10 Essential (primary) hypertension: Secondary | ICD-10-CM

## 2010-12-28 DIAGNOSIS — F411 Generalized anxiety disorder: Secondary | ICD-10-CM

## 2010-12-28 DIAGNOSIS — M25562 Pain in left knee: Secondary | ICD-10-CM

## 2010-12-28 DIAGNOSIS — G4700 Insomnia, unspecified: Secondary | ICD-10-CM

## 2010-12-28 DIAGNOSIS — M25569 Pain in unspecified knee: Secondary | ICD-10-CM

## 2010-12-28 DIAGNOSIS — Z0000 Encounter for general adult medical examination without abnormal findings: Secondary | ICD-10-CM | POA: Insufficient documentation

## 2010-12-28 MED ORDER — ZOLPIDEM TARTRATE ER 12.5 MG PO TBCR: 12.5000 mg | EXTENDED_RELEASE_TABLET | Freq: Every evening | ORAL | Status: DC | PRN

## 2010-12-28 NOTE — Assessment & Plan Note (Signed)
stable overall by hx and exam, ok to continue meds/tx as is   Lab Results  Component Value Date   WBC 3.4* 06/29/2010   HGB 12.6 12/20/2010   HCT 36.2 12/20/2010   PLT 229 12/20/2010   CHOL 203* 06/29/2010   TRIG 99.0 06/29/2010   HDL 78.70 06/29/2010   LDLDIRECT 111.1 06/29/2010   ALT 33 12/20/2010   AST 32 12/20/2010   NA 139 12/20/2010   K 4.1 12/20/2010   CL 98 12/20/2010   CREATININE 0.80 12/20/2010   BUN 12 12/20/2010   CO2 29 12/20/2010   TSH 0.48 06/29/2010   INR 1.00 01/24/2010

## 2010-12-28 NOTE — Assessment & Plan Note (Signed)
stable overall by hx and exam, ok to continue meds/tx as is  - to refill the [place] cr

## 2010-12-28 NOTE — Patient Instructions (Addendum)
Please go to Xray in the Basement for the Xray today Please call the number on the [facility] Card for results in 2-3 days Continue all other medications as before Your Remus Loffler CR will be faxed to [facility], and the PA will need to be done most likely Please return in Sept 2012 for yearly exam with blood work 3-5 days before at the lab in the basement

## 2010-12-28 NOTE — Progress Notes (Signed)
Subjective:    Patient ID: Tamara Ortiz, female    DOB: 12/25/1940, 70 y.o.   MRN: [id number]  HPI  Here to f/u; unfortunately fell after missing a step, and fell to the concrete with full force on the left knee (no other site of injury);  Still with ant pain depsite the contusion now healed, but with swelling to the left knee; no click or catch and does not acutally hurt worse to walk, but still mod aching during the day, which seems worse at night and cant sleep, despite 1/2 oxycodone 5 mg;  No sense of give away since the fall, and no fever, unusual wt loss; no furhter falls, or injury.  [facility], but now seems a bit worse.  Pt denies chest pain, increased sob or doe, wheezing, orthopnea, PND, increased LE swelling, palpitations, dizziness or syncope.    Pt denies new neurological symptoms such as new headache, or facial or extremity weakness or numbness.   Pt denies polydipsia, polyuria. Denies worsening depressive symptoms, suicidal ideation, or panic, though has ongoing anxiety, not increased recently.    Also for some reason she received a 40 mg 3 mo rx for simvastatin from [facility] and she only takes 20 mg, but tolerates well.    Also ambien  CR apparently needs PA with [facility]. ; has tried several other meds that dont work well and "wired me up" ; uses prn only.    Past Medical History  Diagnosis Date  . 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 Hypertension   . 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 HYPERLIPIDEMIA 11/05/2007  . ANXIETY 05/13/2009  . DEPRESSIVE DISORDER 05/06/2008  . HYPERTENSION 11/05/2007  . ALLERGIC RHINITIS 11/05/2007  . CONSTIPATION, CHRONIC 05/13/2009  . UTI 05/06/2008  . BACK PAIN 05/11/2008  . OSTEOPOROSIS 11/05/2007  . INSOMNIA-SLEEP DISORDER-UNSPEC 05/13/2009  . FATIGUE 05/13/2009  . Swelling, mass, or lump in chest 11/05/2007  . LUNG CANCER, HX OF 05/06/2008   Past Surgical History  Procedure Date    . Tubal ligation   . S/p lul lung surgury 12/2007    reports that she has never smoked. She does not have any smokeless tobacco history on file. She reports that she drinks alcohol. She reports that she does not use illicit drugs. family history includes ALS in her father; Alcohol abuse in her son; and Heart disease in her mother. Allergies  Allergen Reactions  . Amoxicillin     REACTION: hives/itch  . Ciprofloxacin     REACTION: red, itch  . Penicillins     REACTION: hives/itch   Current Outpatient Prescriptions on File Prior to Visit  Medication Sig Dispense Refill  . aspirin 81 MG EC tablet Take 81 mg by mouth daily.        . Calcium Carbonate-Vitamin D (CALCIUM-VITAMIN D) 500-200 MG-UNIT per tablet Take 1 tablet by mouth 2 (two) times daily.        [name] losartan-hydrochlorothiazide (HYZAAR) 100-12.5 MG per tablet Take 1 tablet by mouth daily.        . Multiple Vitamin (ANTIOXIDANT A/C/E PO) Take by mouth daily.        . Omega-3 350 MG CAPS Take by mouth daily.        . polyethylene glycol powder (MIRALAX) powder Take 17 g by mouth daily.        . simvastatin (ZOCOR) 40 MG tablet Take 1  tablet (40 mg total) by mouth every evening.  90 tablet  2  . valsartan-hydrochlorothiazide (DIOVAN HCT) 80-12.5 MG per tablet Take 1 tablet by mouth. 1/2 by mouth once daily       . vitamin E ([employer] VITAMIN E) 400 UNIT capsule Take 400 Units by mouth daily.        [name] zolendronic acid (ZOMETA) 4 MG/5ML injection Inject 4 mg into the vein once.        [name] zolpidem (AMBIEN CR) 12.5 MG CR tablet Take 12.5 mg by mouth at bedtime as needed.        . docusate sodium (COLACE) 100 MG capsule Take 100 mg by mouth 2 (two) times daily.        [name] gabapentin (NEURONTIN) 100 MG capsule Take 100 mg by mouth 2 (two) times daily.        . nitrofurantoin (MACRODANTIN) 100 MG capsule Take 100 mg by mouth 2 (two) times daily.        [name] oxyCODONE (OXYCONTIN) 20 MG 12 hr tablet Take 20 mg by mouth. 1 by mouth two times a day as  needed for pain       . Oxycodone HCl 10 MG TABS Take 10 mg by mouth. 1 by mouth every 6 hours as needed for breakthrough pain       . vitamin B-12 (CYANOCOBALAMIN) 100 MCG tablet Take 50 mcg by mouth daily.              Review of Systems Review of Systems  Constitutional: Negative for diaphoresis and unexpected weight change.  HENT: Negative for drooling and tinnitus.   Eyes: Negative for photophobia and visual disturbance.  Respiratory: Negative for choking and stridor.   Gastrointestinal: Negative for vomiting and blood in stool.  Genitourinary: Negative for hematuria and decreased urine volume.  Musculoskeletal: Negative for gait problem.  Skin: Negative for color change and wound.  Neurological: Negative for tremors and numbness.  Psychiatric/Behavioral: Negative for decreased concentration. The patient is not hyperactive.       Objective:   Physical Exam Physical Exam  VS noted Constitutional: Pt appears well-developed and well-nourished.  HENT: Head: Normocephalic.  Right Ear: External ear normal.  Left Ear: External ear normal.  Eyes: Conjunctivae and EOM are normal. Pupils are equal, round, and reactive to light.  Neck: Normal range of motion. Neck supple.  Cardiovascular: Normal rate and regular rhythm.   Pulmonary/Chest: Effort normal and breath sounds normal.  Abd:  Soft, NT, non-distended, + BS Neurological: Pt is alert. No cranial nerve deficit.  Skin: Skin is warm. No erythema.  Psychiatric: Pt behavior is normal. Thought content normal.  MSK  Left knee wityh 1+ effusion, and warm anterolaterally, has FROM but with click at the same area       Assessment & Plan:

## 2010-12-28 NOTE — Assessment & Plan Note (Addendum)
With effusion and ongoing pain for last 3 wks and click on exam  - suspect prob relatively small cartilage tear post fall;  Has pain meds already;  Will check film and refer ortho, as she wants to defer MRI at this time, due to the large number of CT scans she has had

## 2010-12-28 NOTE — Assessment & Plan Note (Signed)
stable overall by hx and exam, ok to continue meds/tx as is

## 2010-12-29 ENCOUNTER — Telehealth: Payer: Self-pay | Admitting: Internal Medicine

## 2010-12-29 NOTE — Telephone Encounter (Signed)
Pt states that she discussed authorization for Ambien CR w/you. Pt called [employer] and was informed that they would need PA from MD.   If you would like PA for this Rx you can just let me know. I am currently handling Prior Authorizations for this office. Thanks, Jasmine December

## 2010-12-29 NOTE — Telephone Encounter (Signed)
Yes, please.

## 2011-01-10 ENCOUNTER — Other Ambulatory Visit: Payer: Self-pay | Admitting: Internal Medicine

## 2011-01-10 ENCOUNTER — Encounter (HOSPITAL_BASED_OUTPATIENT_CLINIC_OR_DEPARTMENT_OTHER): Payer: Medicare PPO | Admitting: Internal Medicine

## 2011-01-10 DIAGNOSIS — C7952 Secondary malignant neoplasm of bone marrow: Secondary | ICD-10-CM

## 2011-01-10 DIAGNOSIS — C3410 Malignant neoplasm of upper lobe, unspecified bronchus or lung: Secondary | ICD-10-CM

## 2011-01-10 DIAGNOSIS — Z5111 Encounter for antineoplastic chemotherapy: Secondary | ICD-10-CM

## 2011-01-10 LAB — COMPREHENSIVE METABOLIC PANEL
Albumin: 4.8 g/dL (ref 3.5–5.2)
CO2: 27 mEq/L (ref 19–32)
Glucose, Bld: 87 mg/dL (ref 70–99)
Potassium: 4.1 mEq/L (ref 3.5–5.3)
Sodium: 140 mEq/L (ref 135–145)
Total Protein: 7 g/dL (ref 6.0–8.3)

## 2011-01-10 LAB — CBC WITH DIFFERENTIAL/PLATELET
Basophils Absolute: 0 10*3/uL (ref 0.0–0.1)
Eosinophils Absolute: 0.1 10*3/uL (ref 0.0–0.5)
HGB: 12.5 g/dL (ref 11.6–15.9)
LYMPH%: 37.8 % (ref 14.0–49.7)
MONO#: 0.4 10*3/uL (ref 0.1–0.9)
NEUT#: 1.8 10*3/uL (ref 1.5–6.5)
Platelets: 220 10*3/uL (ref 145–400)
RBC: 3.78 10*6/uL (ref 3.70–5.45)
RDW: 12.8 % (ref 11.2–14.5)
WBC: 3.7 10*3/uL — ABNORMAL LOW (ref 3.9–10.3)

## 2011-01-16 ENCOUNTER — Encounter (HOSPITAL_BASED_OUTPATIENT_CLINIC_OR_DEPARTMENT_OTHER): Payer: Medicare PPO | Admitting: Internal Medicine

## 2011-01-16 ENCOUNTER — Other Ambulatory Visit: Payer: Self-pay | Admitting: Internal Medicine

## 2011-01-16 DIAGNOSIS — C7952 Secondary malignant neoplasm of bone marrow: Secondary | ICD-10-CM

## 2011-01-16 DIAGNOSIS — C3410 Malignant neoplasm of upper lobe, unspecified bronchus or lung: Secondary | ICD-10-CM

## 2011-01-16 LAB — CBC WITH DIFFERENTIAL/PLATELET
Eosinophils Absolute: 0 10*3/uL (ref 0.0–0.5)
MONO#: 0.3 10*3/uL (ref 0.1–0.9)
NEUT#: 2.2 10*3/uL (ref 1.5–6.5)
RBC: 3.62 10*6/uL — ABNORMAL LOW (ref 3.70–5.45)
RDW: 12.6 % (ref 11.2–14.5)
WBC: 4.3 10*3/uL (ref 3.9–10.3)
lymph#: 1.7 10*3/uL (ref 0.9–3.3)

## 2011-01-16 LAB — COMPREHENSIVE METABOLIC PANEL
Albumin: 3.7 g/dL (ref 3.5–5.2)
Alkaline Phosphatase: 51 U/L (ref 39–117)
CO2: 29 mEq/L (ref 19–32)
Glucose, Bld: 109 mg/dL — ABNORMAL HIGH (ref 70–99)
Potassium: 4.1 mEq/L (ref 3.5–5.3)
Sodium: 139 mEq/L (ref 135–145)
Total Protein: 6 g/dL (ref 6.0–8.3)

## 2011-01-31 ENCOUNTER — Other Ambulatory Visit: Payer: Self-pay | Admitting: Internal Medicine

## 2011-01-31 ENCOUNTER — Encounter (HOSPITAL_BASED_OUTPATIENT_CLINIC_OR_DEPARTMENT_OTHER): Payer: Medicare PPO | Admitting: Internal Medicine

## 2011-01-31 DIAGNOSIS — C7952 Secondary malignant neoplasm of bone marrow: Secondary | ICD-10-CM

## 2011-01-31 DIAGNOSIS — M7989 Other specified soft tissue disorders: Secondary | ICD-10-CM

## 2011-01-31 DIAGNOSIS — C3410 Malignant neoplasm of upper lobe, unspecified bronchus or lung: Secondary | ICD-10-CM

## 2011-01-31 DIAGNOSIS — C7951 Secondary malignant neoplasm of bone: Secondary | ICD-10-CM

## 2011-01-31 DIAGNOSIS — Z5111 Encounter for antineoplastic chemotherapy: Secondary | ICD-10-CM

## 2011-01-31 DIAGNOSIS — C3490 Malignant neoplasm of unspecified part of unspecified bronchus or lung: Secondary | ICD-10-CM

## 2011-01-31 LAB — CBC WITH DIFFERENTIAL/PLATELET
EOS%: 0.9 % (ref 0.0–7.0)
Eosinophils Absolute: 0 10*3/uL (ref 0.0–0.5)
MCV: 96.2 fL (ref 79.5–101.0)
MONO%: 9.5 % (ref 0.0–14.0)
NEUT#: 2.5 10*3/uL (ref 1.5–6.5)
RBC: 3.71 10*6/uL (ref 3.70–5.45)
RDW: 12.5 % (ref 11.2–14.5)
WBC: 4.4 10*3/uL (ref 3.9–10.3)
nRBC: 0 % (ref 0–0)

## 2011-01-31 LAB — COMPREHENSIVE METABOLIC PANEL
Albumin: 4.3 g/dL (ref 3.5–5.2)
BUN: 14 mg/dL (ref 6–23)
Calcium: 9.8 mg/dL (ref 8.4–10.5)
Chloride: 102 mEq/L (ref 96–112)
Creatinine, Ser: 0.72 mg/dL (ref 0.40–1.20)
Glucose, Bld: 91 mg/dL (ref 70–99)
Potassium: 4.3 mEq/L (ref 3.5–5.3)
Sodium: 140 mEq/L (ref 135–145)

## 2011-02-13 ENCOUNTER — Other Ambulatory Visit: Payer: Self-pay | Admitting: Internal Medicine

## 2011-02-13 ENCOUNTER — Encounter (HOSPITAL_BASED_OUTPATIENT_CLINIC_OR_DEPARTMENT_OTHER): Payer: Medicare PPO | Admitting: Internal Medicine

## 2011-02-13 DIAGNOSIS — C7951 Secondary malignant neoplasm of bone: Secondary | ICD-10-CM

## 2011-02-13 DIAGNOSIS — C3410 Malignant neoplasm of upper lobe, unspecified bronchus or lung: Secondary | ICD-10-CM

## 2011-02-13 LAB — COMPREHENSIVE METABOLIC PANEL
AST: 40 U/L — ABNORMAL HIGH (ref 0–37)
Albumin: 3.8 g/dL (ref 3.5–5.2)
Alkaline Phosphatase: 60 U/L (ref 39–117)
BUN: 11 mg/dL (ref 6–23)
Calcium: 9.4 mg/dL (ref 8.4–10.5)
Chloride: 102 mEq/L (ref 96–112)
Glucose, Bld: 95 mg/dL (ref 70–99)
Potassium: 3.9 mEq/L (ref 3.5–5.3)
Sodium: 138 mEq/L (ref 135–145)
Total Protein: 6.4 g/dL (ref 6.0–8.3)

## 2011-02-13 LAB — CBC WITH DIFFERENTIAL/PLATELET
Basophils Absolute: 0 10*3/uL (ref 0.0–0.1)
EOS%: 0.2 % (ref 0.0–7.0)
Eosinophils Absolute: 0 10*3/uL (ref 0.0–0.5)
HCT: 36.7 % (ref 34.8–46.6)
HGB: 12.7 g/dL (ref 11.6–15.9)
MONO#: 0.4 10*3/uL (ref 0.1–0.9)
NEUT#: 2.6 10*3/uL (ref 1.5–6.5)
NEUT%: 48.8 % (ref 38.4–76.8)
RDW: 12.6 % (ref 11.2–14.5)
WBC: 5.4 10*3/uL (ref 3.9–10.3)
lymph#: 2.4 10*3/uL (ref 0.9–3.3)

## 2011-02-14 ENCOUNTER — Ambulatory Visit (HOSPITAL_COMMUNITY)
Admission: RE | Admit: 2011-02-14 | Discharge: 2011-02-14 | Disposition: A | Discharge status: Home / Self Care | Payer: Medicare PPO | Source: Ambulatory Visit | Attending: Internal Medicine | Admitting: Internal Medicine

## 2011-02-14 ENCOUNTER — Encounter (HOSPITAL_COMMUNITY): Payer: Self-pay

## 2011-02-14 DIAGNOSIS — K449 Diaphragmatic hernia without obstruction or gangrene: Secondary | ICD-10-CM | POA: Insufficient documentation

## 2011-02-14 DIAGNOSIS — I700 Atherosclerosis of aorta: Secondary | ICD-10-CM | POA: Insufficient documentation

## 2011-02-14 DIAGNOSIS — C3490 Malignant neoplasm of unspecified part of unspecified bronchus or lung: Secondary | ICD-10-CM | POA: Insufficient documentation

## 2011-02-14 DIAGNOSIS — C7951 Secondary malignant neoplasm of bone: Secondary | ICD-10-CM | POA: Insufficient documentation

## 2011-02-14 DIAGNOSIS — C7952 Secondary malignant neoplasm of bone marrow: Secondary | ICD-10-CM | POA: Insufficient documentation

## 2011-02-14 DIAGNOSIS — I2510 Atherosclerotic heart disease of native coronary artery without angina pectoris: Secondary | ICD-10-CM | POA: Insufficient documentation

## 2011-02-14 MED ORDER — IOHEXOL 300 MG/ML  SOLN
100.0000 mL | Freq: Once | INTRAMUSCULAR | Status: AC | PRN
  Administered 2011-02-14: 100 mL via INTRAVENOUS

## 2011-02-14 NOTE — Assessment & Plan Note (Signed)
OFFICE VISIT   Tamara Ortiz, Tamara Ortiz  DOB:  [birth year]                                        April 22, 2009  CHART #:  [id number]   The patient comes in for a 78-month interval followup visit.  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  She was treated with adjuvant chemotherapy.  She is also  being followed by Dr. Arbutus Ped.  She last saw him in May, at which time a  CT scan was done which showed no evidence of recurrent disease.  She  states that since I last saw her, she had been doing well.  She is  working full-time.  She has not had any difficulty with her breathing.  She denies any unusual cough, hemoptysis, shortness of breath, fevers or  chills, any significant weight change.  No new bone or joint pain and no  new or unusual neurologic symptoms.   Her current medications are Diovan HCT 80/12.5 one-half tablet daily,  Lipitor 20 mg daily, Actonel 150 mg monthly, simvastatin 80 mg daily.  She uses Ambien and Lortab p.r.n.  Gabapentin has been discontinued.   She has no known drug allergies.   REVIEW OF SYSTEMS:  See HPI, otherwise negative.   PHYSICAL EXAMINATION:  GENERAL:  The patient is a well-appearing 70-year-  old woman in no acute distress.  VITAL SIGNS:  Her blood pressure is 124/76, pulse 55, respirations are  18, and her oxygen saturation is 96% on room air.  LUNGS:  Clear with equal breath sounds bilaterally.  NECK:  There is no cervical, supraclavicular, axillary, or epitrochlear  adenopathy.  SKIN:  Her incisions are well healed.   Chest x-ray shows good aeration of the lungs bilaterally.  There is no  active disease and no evidence of recurrence.   IMPRESSION:  The patient is a 70 year old woman, status post left upper  lobectomy for stage IIB non-small cell carcinoma.  She was treated with  adjuvant chemotherapy.  She is  now 16 months out from surgery with no  evidence of recurrent disease.  She has an appointment to see Dr.  Arbutus Ped in 2 months that will be 36-month followup visit.  I am going to  just plan to see her back in March which will be a 2-year followup.  We  will do a CT of the chest at that time.  She had questions regarding use  of PET scans.  I think in her case, we will continue to follow her with  CT and use PET as second-line test if there were to be any new or  unusual findings or any symptoms that might not be explained by CT.  Of  course, I would be happy to see her back in the interim if she were to  have any problems, I could be of assistance with.   Salvatore Decent Dorris Fetch, M.D.  Electronically Signed   SCH/MEDQ  D:  04/22/2009  T:  04/23/2009  Job:  [id number]   cc:   Lajuana Matte, MD  Corwin Levins, MD  Charlcie Cradle Delford Field, MD, FCCP

## 2011-02-14 NOTE — Assessment & Plan Note (Signed)
OFFICE VISIT   [name], [name]  DOB:  [birth year]                                        December 26, 2007  CHART #:  [id number]   Ms. Sample returns to the office today in followup after her left video-  assisted thoracostomy, left upper lobectomy, and mediastinal lymph node  dissection by Dr. Dorris Fetch on December 10, 2007. The patient has done  well postoperatively. She has had no respiratory problems. Her primary  problem has revolved around incisional pain and need for pain  medication. A prescription was called in for Lortab 5/500 thirty tablets  over the weekend, though she never picked it up and instead went to the  emergency room and got Lortab 7.5/500 from the emergency room physician.  She returns today for a followup visit with only 2 pain pills left.   On exam, her blood pressure is 130/78; pulse is 72. Respiratory rate is  16. O2 sats are 97%. Her lungs are clear bilaterally without wheezing.  Her left minithoracotomy is well-healed. The chest tube sites are  slightly red but without any evidence of infection. She has no pedal  edema.   Followup chest x-ray shows clear lung fields bilaterally.   I had a discussion with her about control of postoperative pain. I have  written her a prescription today for Lortab 5/500 one or two p.o. every  6 hours p.r.n. for pain, 40 tablets. We have given her a return  appointment in 3 weeks. She is to see Dr. Arbutus Ped next week to discuss  further treatment options. 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   Tamara Plane, MD  Electronically Signed   EG/MEDQ  D:  12/26/2007  T:  12/26/2007  Job:  [id number]   cc:   Lajuana Matte, MD

## 2011-02-14 NOTE — Assessment & Plan Note (Signed)
[facility]                                 ON-CALL NOTE   Tamara Ortiz, Tamara Ortiz                        MRN:          [id number]  DATE:12/29/2007                            DOB:          1941-03-16    This patient of Dr. Jonny Ruiz called at 10:07 p.m. December 29, 2007,  complaining of blood pressures 166/86.  She states she had lung surgery  three weeks ago and blood pressure has been fine over the last day or  so.  It has been elevated.  She has been asymptomatic with no headaches,  no chest pain, no shortness of breath, no numbness or tingling anywhere.  We were just monitoring her blood pressure.  Today, they have been up  and down.  And again, most recent blood pressure is 166/86.  I told her  if she developed any symptoms, she would need to go to the emergency  room, however, as long as she remained asymptomatic,  she would call Dr.  Jonny Ruiz in the morning to be evaluated for blood pressure.  The patient  states that she understands that if she develops any symptoms overnight,  she should go to the emergency room, otherwise, she will see Dr. Jonny Ruiz in  the morning.     Lelon Perla, DO  Electronically Signed    Shawnie Dapper  DD: 12/29/2007  DT: 12/29/2007  Job #: [id number]

## 2011-02-14 NOTE — Assessment & Plan Note (Signed)
[facility]                                 ON-CALL NOTE   Tamara Ortiz, Tamara Ortiz                        MRN:          [id number]  DATE:11/04/2007                            DOB:          Apr 16, 1941    Tamara Ortiz is a patient of Dr. Jonny Ruiz who reports that she was admitted  last week for symptoms of nausea, sweating, and lightheadedness.  She  reports it was due to hypotension.  She was discharged recently.  According to the patient, she was advised not to take her blood pressure  medications.  She was seen yesterday at an urgent care and diagnosed  with sinusitis and started on Zithromax.   Today she noticed some nausea and sweating again.  She denied any  lightheadedness, chest pain, or shortness of breath.  She was concerned  about her blood pressure, so she checked it at a local department store,  and it was noted to be 160/86.  Patient reports that these symptoms were  similar to what got her admitted last week except that she is not having  any dizziness and she is no longer taking her blood pressure  medications.   PLAN:  Advised the patient if she is having recurrent symptoms that led  to her admission last week, I recommended that she return back to the  emergency department or at least be seen at the [facility] urgent care.  Patient agrees and will be going to [facility] and will try the urgent  care first.  If it is not open, she will go to the emergency department.     Leanne Chang, M.D.  Electronically Signed    LA/MedQ  DD: 11/04/2007  DT: 11/05/2007  Job #: [id number]

## 2011-02-14 NOTE — Assessment & Plan Note (Signed)
[facility]                                 ON-CALL NOTE   Tamara Ortiz, Tamara Ortiz                        MRN:          [id number]  DATE:05/17/2008                            DOB:          May 20, 1941    SUBJECTIVE:  Tamara Ortiz was seen middle of last week by her primary  care doctor who diagnosed her low back pain due to arthritis.  She was  given a Lorcet but this is not helping with pain.   ASSESSMENT AND PLAN:  Low back pain.  She is not currently on an anti-  inflammatory.  We discussed her being seen in Urgent Care today if she  has severe pain.  She would like to avoid that if possible.  I will give  her a temporary prescription for meloxicam 15 mg p.o. daily #10 zero  refills.  She will follow up with her primary care doctor early next  week.   Primary Care doctor is Dr. Jonny Ruiz.     Kerby Nora, MD  Electronically Signed    AB/MedQ  DD: 05/17/2008  DT: 05/17/2008  Job #: [id number]
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
## 2011-02-14 NOTE — Letter (Signed)
November 22, 2007   Tamara Ortiz. Delford Field, MD, FCCP  [address]  [place], [place] [address]   Re:  [name], [name]                 DOB:  1940/11/30   Dear Dennie Bible,   I saw Spenser Cong in the office today.  I thank you very much for  referring this very sweet 70 year old woman for evaluation.  As you  know, she had recently presented with a presyncopal spell.  During her  work up a chest x-ray was done which showed a left upper lobe mass.  A  CT scan confirmed a spiculated left upper lobe mass.  She subsequently  had a PET which was positive for increased uptake in the left upper lobe  mass but showed no significant uptake anywhere else, consistent with a  stage I lung cancer.  At bronchoscopy, she had confirmation that it was  a non-small-cell cancer.  I had a long discussion with Tamara Ortiz.  I  recommended to her that we proceed with a VATS left upper lobectomy and  node dissection.  She understands the importance of having this treated  in an expeditious fashion, but has arranged for a second opinion at  [facility] on December 03, 2007.  We are going to tentatively set a  date for surgery for December 10, 2007, if she decides she would like to go  ahead and be treated here, once she has had a chance to get a second  opinion.   Again, thank you for allowing me to see Tamara Ortiz.  Please do not  hesitate to contact me if I can be of any further assistance in the  interim, until we have heard back on how she wants to proceed.   Salvatore Decent Dorris Fetch, M.D.  Electronically Signed   SCH/MEDQ  D:  11/22/2007  T:  11/23/2007  Job:  [id number]   cc:   Corwin Levins, MD

## 2011-02-14 NOTE — Assessment & Plan Note (Signed)
OFFICE VISIT   AVON, MOLOCK  DOB:  1941/04/23                                        April 10, 2008  CHART #:  [id number]   HISTORY OF PRESENT ILLNESS:  The patient is status post left VATS for  left upper lobectomy done by Dr. Dorris Fetch on December 10, 2007.  She was  diagnosed with stage IIB non-small cell carcinoma.  She was last seen in  the office by Dr. Dorris Fetch on January 21, 2008.  Since then, the  patient has undergone a chemotherapy by Dr. Arbutus Ped.  She has completed  her four runs of chemo.  The patient did tolerate this well.  She is  complaining of some upper thigh pain and mild back pain.  She states  that she might have over did it last week and has had these symptoms  associated with chemo as well.  She is scheduled to see Dr. Arbutus Ped on  April 23, 2008, with a followup CT scan on April 20, 2008.  She denies any  shortness of breath, hemoptysis, wheezing, or incisional pain.   PHYSICAL EXAMINATION:  VITAL SIGNS:  Blood pressure of 116/75, pulse of  88, respirations of 18, and O2 sats 98% on room air.  RESPIRATORY:  Clear to auscultation bilaterally.  CARDIAC:  Regular rate and rhythm.  CHEST:  Incisions all healed well.   IMAGING STUDIES:  The patient had PA and lateral chest x-ray done today,  April 10, 2008, showing postoperative changes and stable.  Chest x-ray  evaluated by Dr. Dorris Fetch.   IMPRESSION AND PLAN:  The patient is status post left upper lobectomy  for stage IIB non-small cell lung cancer.  She is status post  chemotherapy.  The patient continues to do well from a surgical  standpoint.  She was given a prescription for 30 of Lortab secondary to  mild leg and back pain.  The patient was seen and evaluated by Dr.  Dorris Fetch.  Plan is to follow up with patient in 4 months.  We will  get a chest x-ray at this time.  She is told that we will evaluate her  CT scan that she is having done, April 20, 2008.  If anything is  significant, we will contact the  patient.  The patient understands and is in agreement.  She was told to  contact us with any surgical questions.   Salvatore Decent Dorris Fetch, M.D.  Electronically Signed   KMD/MEDQ  D:  04/10/2008  T:  04/11/2008  Job:  [id number]   cc:   Salvatore Decent. Dorris Fetch, M.D.  Lajuana Matte, MD
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
## 2011-02-14 NOTE — Op Note (Signed)
Tamara Ortiz, DESANTIAGO               ACCOUNT NO.:  [id number]   MEDICAL RECORD NO.:  [id number]          PATIENT TYPE:  AMB   LOCATION:  CARD                         FACILITY:  [facility]   PHYSICIAN:  Charlcie Cradle. Delford Field, MD, FCCPDATE OF BIRTH:  July 13, 1941   DATE OF PROCEDURE:  11/13/2007  DATE OF DISCHARGE:                               OPERATIVE REPORT   CHIEF COMPLAINT:  Left upper lobe lung mass, evaluate for cause.   SURGEON:  Shan Levans, MD.   ANESTHESIA:  1% Xylocaine local.   PREOP MEDICATION:  Fentanyl 30 mcg, Versed 3 mg IV push.   PROCEDURE:  The Pentax video bronchoscope was introduced via left  nostril.  The upper airways were visualized and were unremarkable.  The  entire tracheobronchial tree was reviewed and revealed no endobronchial  lesions. Transbronchial biopsies x5 were obtained from the left upper  lobe apical posterior segment.  Bronchial washings were obtained.   COMPLICATIONS:  None.   IMPRESSION:  Left upper lobe mass, evaluate for cause.   RECOMMENDATIONS:  Follow up pathology and  microbiology.      Charlcie Cradle Delford Field, MD, [facility]  Electronically Signed     PEW/MEDQ  D:  11/13/2007  T:  11/14/2007  Job:  [id number]   cc:   Titus Dubin. Alwyn Ren, MD,FACP,FCCP  [phone] [place]  [place] [address]

## 2011-02-14 NOTE — Op Note (Signed)
NAMECLAIRA, JETER               ACCOUNT NO.:  [id number]   MEDICAL RECORD NO.:  [id number]          PATIENT TYPE:  INP   LOCATION:  3301                         FACILITY:  [facility]   PHYSICIAN:  Salvatore Decent. Dorris Fetch, M.D.DATE OF BIRTH:  Nov 06, 1940   DATE OF PROCEDURE:  12/10/2007  DATE OF DISCHARGE:                               OPERATIVE REPORT   PREOPERATIVE DIAGNOSIS:  Non small cell carcinoma left upper lobe,  clinical stage I-B.   POSTOPERATIVE DIAGNOSIS:  Non small cell carcinoma left upper lobe,  clinical stage I-B.   PROCEDURE:  Left video assisted thoracoscopy, left upper lobectomy,  mediastinal lymph node dissection.   SURGEON:  Salvatore Decent. Dorris Fetch, M.D.   ASSISTANT:  [name], P.A.-C.   ANESTHESIA:  General.   FINDINGS:  5 cm left upper lobe mass, benign appearing adhesions of the  upper lobe to the chest wall not in direct continuity with the mass.  Level 5, 11 and 10 nodes negative for cancer on frozen section.  Bronchial margin negative on frozen section.   CLINICAL NOTE:  Ms. Tamara Ortiz is a 70 year old woman with no history of  tobacco abuse who presents with a newly discovered lung mass. This was  hypermetabolic on PET scan. Bronchoscopy revealed non small cell  carcinoma. Of note, on PET scan, there was no evidence of hilar or  mediastinal adenopathy or metastatic disease.  The patient was advised  to undergo left upper lobectomy.  The indications, risks, benefits and  alternatives were discussed in detail with the patient.  She understood  and accepted the risks and agreed to proceed.   OPERATIVE NOTE:  Ms. Tamara Ortiz was brought to the preop holding area on  December 10, 2007.  There, a central line and arterial blood pressure  monitoring catheters were placed by Dr. Arta Bruce of the anesthesia  service.  Intravenous antibiotics were administered.  PAS hose were  placed for DVT prophylaxis.  The patient was taken to the operating  room, anesthetized,  and intubated with a double lumen endotracheal tube.  A Foley catheter was placed.  The patient was placed in a right lateral  decubitus position and the left chest was prepped and draped in the  usual fashion.  A transverse incision was made at approximately the 7th  intercostal space in the midaxillary line.  This was carried through the  skin and subcutaneous tissue.  The chest was entered bluntly using a  hemostat.  The patient was on single lung ventilation of the right lung  which she tolerated well throughout the procedure.   A port was inserted through the incision and the thoracoscope was placed  through the port.  Visualization revealed relatively normal appearing  lower lobe.  In the upper lobe, there was a mass involving the visceral  pleura.  The fissure was relatively complete. There were some adhesions  of the upper lobe posterolaterally, not in direct continuity with the  mass.  These appeared benign and were filmy.  They were taken down with  electrocautery.  A second incision was made posteriorly for retraction  and a small  utility thoracotomy was made anteriorly approximately 8 cm  in length. The latissimus muscle was divided.  The serratus muscle was  spared and was retracted anteriorly.  The intercostal muscles were  divided.  A laminar spreader was used gently spread the ribs to allow  for better access. The pleural reflection then was divided medially  exposing the pulmonary vein. The aortopulmonary window nodes were  identified.  A relatively large node was easily visible.  This was taken  and sent for frozen section which was negative for tumor.   At this point, the dissection superiorly was stopped and the dissection  was carried into the fissure using gentle sharp and blunt dissection and  electrocautery.  The pulmonary artery to the lower lobe was identified.  The lingular branches arose as a single trunk.  There was a node  adjacent to the lingular branches  which made initial dissection  difficult.  The fissure was completed anteriorly with the firing of an  ATB-45 stapler. Control was gained on the pulmonary venous branches  separately.  The lingular branch was encircled first and divided with  the ATW-35 stapler. The remaining branch was carefully dissected out and  divided with ATW-35 stapler.  At this point, the lingular branch was  encircled using careful dissection and divided with the ATW-35 stapler.  The bronchus then was identified and there was bleeding from bronchial  arteries which was controlled using electrocautery and clips. The  bronchus was encircled and stapled with a TL-30 stapler.  The bronchus  then was divided. An additional branch to the upper lobe was identified  and divided during this dissection.  The suction tip caught on a staple  from the lingular staple line on the pulmonary artery creating a bleed  from the pulmonary artery.  This was controlled with direct pressure and  then a 5-0 Prolene suture was used to repair this. The nodes were  dissected off the pulmonary artery easily and sent for frozen sections  and were negative for cancer. The remaining two branches were divided  with ATW-35 stapler and there was bleeding from the anterior branch  which required a 5-0 Prolene suture.  The specimen was placed in an  endoscopic bag and removed without difficulty and sent for frozen  section on the bronchial margin which was negative. The inferior  pulmonary ligament was mobilized. A small level 9 node was identified  and was sent for permanent sections.  Additional level 10 and 11 nodes  were identified in the hilum and sent for permanent specimens.  These  all appeared benign clinically. The subcarinal space was exposed and  relatively normal appearing nodes were sent for permanent section.  Finally, the aortopulmonary window was inspected, once again, and  additional nodes were identified.  These again appeared  relatively  normal and were sent for permanent section.   Hemostasis was achieved.  The chest was copiously irrigated with warm  saline.  An On-Q catheter was placed through a separate stab incision  posteriorly and tunneled into a subpleural location.  Bupivacaine was  administered. A second port type incision was made inferiorly and a 36-  French right angle tube was directed posteriorly and a 28-French  straight tube was directed anteriorly.  Both were secured with #1 silk  sutures. The posterior port incision was closed with a 0 Vicryl fascial  suture and a 3-0 Vicryl subcuticular suture.  The anterior utility  thoracotomy was closed with running 0 Vicryl suture to reattach  the  serratus inferolaterally, the latissimus was closed with running 0  Vicryl suture, the subcutaneous tissue and skin were closed in standard  fashion. All sponge, needle and instrument counts were correct at the  end of the procedure. The patient was taken from the operating room to  the post anesthesia care unit in good condition.      Salvatore Decent Dorris Fetch, M.D.  Electronically Signed     SCH/MEDQ  D:  12/10/2007  T:  12/11/2007  Job:  [id number]   cc:   Charlcie Cradle. Delford Field, MD, [facility]  Corwin Levins, MD

## 2011-02-14 NOTE — Assessment & Plan Note (Signed)
OFFICE VISIT   Tamara, Ortiz  DOB:  1940/12/09                                        January 21, 2008  CHART #:  [id number]   REASON FOR VISIT:  Tamara Ortiz is a 70 year old woman who had a left  VATS with a left upper lobectomy and node dissection on December 10, 2007.  Clinically, it was a stage IB, but at surgery, one of the level 12 nodes  was positive making it a M1 or stage IIB, T2 M1 lesion.  She saw Dr.  Arbutus Ped this morning and very concerned about chemotherapy.  She states  that she is still having some incisional pain.  She was taking about a  half of a Lortab 3 times a day, but ran out of those and has been having  some pain particularly as she increases her activity.  Her exercise  tolerance is good and she has not had any problems with shortness of  breath, cough or wheezing.   PHYSICAL EXAMINATION:  GENERAL:  Tamara Ortiz is a 70 year old, white  female in no acute distress.  VITAL SIGNS:  Blood pressure 138/83, pulse 80, respirations 18, oxygen  saturation is 95% on room air.  LUNGS:  Equal breath sounds bilaterally.  Her thoracotomy wound is well-  healed as his her chest tube site.   Chest x-ray shows postoperative changes with good aeration of the left  lower lobe and no evidence of recurrent disease.   IMPRESSION/PLAN:  Tamara Ortiz is now 6 weeks out from a left upper  lobectomy for what turned out to be a stage IIB lesion.  I had a long  discussion with her.  She was very concerned, very tearful and upset  about her prognosis.  Although, she is IIB, I think she is relatively  favorable because she just barely makes it into the IIB group with a 3.5  cm tumor making it T2 and 1/12 node positive.  I did strongly encourage  her to do the chemotherapy because it does give her about a 20% risk  reduction of recurrence and dying from cancer.  She also is otherwise  healthy and unlikely to die of other causes in the near future.  She was  a  little confused by the numbers and wishes to have some more time to  think about this.  I encouraged her to call Dr. Asa Lente office and  decide whether or not she wants to go through with this or whether she  would like to wait a little while before proceeding.  Again, I strongly  encouraged her to do so, but did encourage her to make sure that she was  comfortable with the decision before proceeding.  I will plan to see her  back in 2 months.  I did give her a prescription for Lortab 40 tablets,  1-2 three times a day as needed for pain.  She also can take a half of  tablet if she wishes.  She may drive and really has no restrictions of  her activities, although I did encourage her to build into her  activities gradually.   Salvatore Decent Dorris Fetch, M.D.  Electronically Signed   SCH/MEDQ  D:  01/21/2008  T:  01/21/2008  Job:  [id number]

## 2011-02-14 NOTE — H&P (Signed)
Tamara Ortiz, Tamara Ortiz               ACCOUNT NO.:  [id number]   MEDICAL RECORD NO.:  [id number]          PATIENT TYPE:  OBV   LOCATION:  4729                         FACILITY:  [facility]   PHYSICIAN:  Titus Dubin. Hopper, MD,FACP,FCCPDATE OF BIRTH:  1941-01-18   DATE OF ADMISSION:  10/29/2007  DATE OF DISCHARGE:                              HISTORY & PHYSICAL   Tamara Ortiz is a 70 year old white female admitted to Observation for  post-exertional dizziness, lightheadedness and near syncope associated  with diaphoresis.  She typically walks for 30 minutes at lunch, climbing  stairs.  She completed her exercise program but noted the acute onset of  dizziness associated with diaphoresis with sensation of being cold and  clammy, which persisted for 30 minutes.  It did resolve and she was left  with the sensation of fatigue.  She denies any associated shortness of  breath or specific chest pain.  EKG revealed very minor non-specific ST  T-wave changes.  She does have risk factors of dyslipidemia,  hypertension and a family history of coronary artery disease.   PAST MEDICAL HISTORY:  1. Appendectomy and tonsillectomy.  2. She also has a history of osteopenia/osteoporosis.   No known drug allergies.   She does not smoke and drinks minimally.   Her mother had myocardial infarction at age 75.  There is no family  history of diabetes or cancer.   She is on:  1. Actonel 150 mg monthly.  2. Diovan/HCT 160/12.5 daily.  3. Zocor 20 mg daily.  4. Aspirin 81 mg daily.   Her review of systems is negative for headaches or epistaxis.  She has  had a recent upper respiratory tract infection/sinusitis, for which she  took amoxicillin.  At this time she denies facial pain, fever or  purulent secretions. She denies exertional chest pain, claudication,  palpitations or other cardiopulmonary symptoms even with her exercise.  She apparently did have a negative stress test in 2008.  She denies any  cough,  sputum or shortness of breath at this time.  She has no  hypersomnolence, morning headache or excessive snoring.  She also denies  dyspepsia, abdominal pain, nausea, vomiting, constipation, diarrhea,  rectal bleeding or melena.  She has had some increased thirst but no  polyphagia or polyuria.  She has no temperature intolerance.  She has  noted some hand tingling at night.  She has decreased hearing but no  tinnitus.  She has had some slight swelling of the left ankle for 1  week.  She denies any memory or focus problems, balance or coordination  issues.  She has had no psychiatric symptoms such as anxiety, depression  or mood swings.   GENERAL:  At this time she appears comfortable, lying on the gurney.  VITAL SIGNS:  Pulse 73 and regular, respiratory rate 15, O2 sat 96% on  room air, blood pressure 111/71.  She has minor arterial narrowing.  HEENT:  There is mild micrognathia.  The right tympanic membrane is  slightly dull.  Nares are patent.  Dental hygiene is excellent.  NECK:  Thyroid is normal to palpitations.  CHEST:  Clear with no increased work of breathing.  CARDIOVASCULAR:  She has an S4 with no significant murmurs or gallops.  ABDOMEN:  Nontender with no organomegaly or masses.  There is a  suggestion that there may be decreased height as the thorax is in close  proximity to the hips with decreased  abdominal expanse .  EXTREMITIES:  Pedal pulses are intact.  I could not appreciate  significant edema.  Homan's sign is negative.  NEUROLOGIC:  She is alert and oriented x3.  There are no localized  neuropsychiatric deficits.  She moves all extremities and strength  appears normal.  SKIN:  Is warm and dry with no suspicious lesions.   Initial enzymes are negative.  The old chart was reviewed.  Her last lipids were in June with an LDL of  140 and HDL 62.  Her glucose was 105.   She will be placed on telemetry with Lovenox coverage.  Serial cardiac  enzymes will be collected.   While hospitalized, an A1c, as well as  fasting lipids will also be checked.  She will be followed up by Dr.  [name] with further evaluation as an outpatient if these enzymes are  negative.      Titus Dubin. Alwyn Ren, MD,FACP,FCCP  Electronically Signed     [facility]/MEDQ  D:  10/29/2007  T:  10/30/2007  Job:  [id number]

## 2011-02-14 NOTE — Discharge Summary (Signed)
Tamara Ortiz, LINEBAUGH               ACCOUNT NO.:  [id number]   MEDICAL RECORD NO.:  [id number]          PATIENT TYPE:  INP   LOCATION:  3301                         FACILITY:  [facility]   PHYSICIAN:  Salvatore Decent. Dorris Fetch, M.D.DATE OF BIRTH:  1940-12-15   DATE OF ADMISSION:  12/10/2007  DATE OF DISCHARGE:  12/16/2007                               DISCHARGE SUMMARY   PRIMARY ADMITTING DIAGNOSIS:  Left upper lobe lung cancer.   ADDITIONAL/DISCHARGE DIAGNOSES:  1. 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  2. Hypertension.  3. Hypercholesteremia.  4. Arthritis.   PROCEDURE PERFORMED:  Left VATS, left mini-thoracotomy with left upper  lobectomy, and lymph node dissection.   HISTORY:  The patient is a 70 year old female who was admitted to [facility] in January of this year following a presyncopal episode.  During this admission, she had a chest x-ray which showed an incidental  left upper lobe lung mass.  She subsequently underwent a CT scan of the  chest which showed a 3.8 x 2.6 cm speculated mass of the left upper lobe  without mediastinal or hilar adenopathy.  A PET scan was then performed  which showed an increased uptake in the left upper lobe with a maximum  SUV of 6.3.  There was no hypermetabolic or activity in the hilar or  mediastinal areas, and no evidence of distal metastasis.  She also  underwent a CT and an MRI of the head, which showed no evidence of  metastatic disease.  She was seen by Dr. Delford Field and underwent a  bronchoscopy with biopsies, which were positive for non-small cell  carcinoma.  Following her discharge, she was referred to Dr. Charlett Lango for surgical consultation.  Dr. Dorris Fetch reviewed her  films and felt that her best course of action at this point would be to  proceed with a left upper lobectomy.  Please see the previously dictated  new patient consultation and history and physical for full details.  He  explained the risks, benefits, and alternatives of the surgery to the  patient, and she agreed to proceed.   HOSPITAL COURSE:  She was admitted to [facility] on December 10, 2007, and underwent a left upper lobectomy performed by Dr. Dorris Fetch.  She tolerated the procedure well and was transferred to the intermediate  care unit in stable condition.  Postoperatively, she has done very well.  Her chest tube initially had a small air leak and was decreased to water-  seal.  By postop day 3, one of her chest tubes have been discontinued,  and the remaining tube was removed on postop day 5.  Her followup chest  x-ray shows a tiny small left apical pneumothorax, which has been stable  for 48 hours.  Otherwise, she has done very well.  She remained  afebrile, and all vital signs have been stable.  Her incisions are all  healing well.  She is ambulating in the halls  without difficulty,  independently.  She is maintaining O2 sats greater than 90% on room air.   LABORATORY DATA:  Her most recent labs show hemoglobin of 10, hematocrit  29, white count 7.2, platelets 273.  Sodium 140, potassium 4.3, BUN 12,  creatinine 0.58.  It is felt that she has remained stable and is doing  well; and on postop day 6, December 16, 2007, she is stable for discharge  home.   DISCHARGE MEDICATIONS:  1. Diovan 80 mg one-half tab daily.  2. Simvastatin 80 mg nightly.  3. Actonel 150 mg monthly.  4. Tylox 1-2 q.6 h. p.r.n. for pain.   DISCHARGE INSTRUCTIONS:  She is asked to refrain from driving, heavy  lifting, or strenuous activity.  She may continue ambulating daily and  using her incentive spirometer.  She may shower daily and clean her  incisions with soap and  water.  She will continue her same preoperative  diet.   DISCHARGE FOLLOWUP:  She will see Dr. Dorris Fetch back in the office in  3 weeks with a chest x-ray.  She will also be referred to a medical  oncologist as an outpatient for  consideration of adjuvant chemotherapy.  In the interim if she experiences problems or has questions, she is  asked to contact our office immediately.      [name], P.A.      Salvatore Decent Dorris Fetch, M.D.  Electronically Signed    GC/MEDQ  D:  12/16/2007  T:  12/16/2007  Job:  [id number]   cc:   Charlcie Cradle. Delford Field, MD, [facility]  Lajuana Matte, MD

## 2011-02-14 NOTE — Consult Note (Signed)
NEW PATIENT CONSULTATION   Tamara Ortiz, Tamara Ortiz  DOB:  07/22/1941                                        November 22, 2007  CHART #:  [id number]   The patient is a 70 year old woman sent for consultation by Dr. Delford Field  regarding a left upper lobe lung cancer which is newly diagnosed.   The patient is a 70 year old woman with no history of tobacco abuse.  She does have a past history of hypertension and hypercholesterolemia.  She was in her usual state of good health until January of this year  when after exercising she became lightheaded and nauseated.  She had a  presyncopal episode, but did not have frank syncope.  She was admitted  to [facility] for workup.  She ruled out for a myocardial  infarction.  She was kept in the hospital for 24 hours.  During that  time she had a chest x-ray which showed a left upper lobe mass.  This  was followed by a CT scan of the chest which showed a 3.8 x 26 cm  speculated mass in the left upper lobe.  There was no mediastinal or  hilar adenopathy.  Of note she did have some coronary calcification on  the CT of the chest.  A PET CT was done which showed increased uptake  with a maximum SUV of 6.3.  There was no hypermetabolic hilar or  mediastinal activity or any evidence of distant metastasis.  She also  had a CT and MRI of her head which showed no evidence of metastatic  disease.  She did have a bronchoscopy and biopsy by Dr. Delford Field which  revealed nonsmall cell carcinoma.   PAST MEDICAL HISTORY:  Significant for hypertension,  hypercholesterolemia, arthritis.  She has no history of coronary artery  disease, cerebrovascular disease, diabetes, or deep venous thrombosis.   CURRENT MEDICATIONS:  1. Diovan hydrochlorothiazide 80/12.5 1/2 tablet daily.  2. Lipitor 20 mg one tablet daily.  3. Actonel 150 mg once monthly.   ALLERGIES:  No known drug allergies.   FAMILY HISTORY:  No history of lung cancer.  Her mother did  have heart  disease.   SOCIAL HISTORY:  She is single.  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  She has never smoked.  She does have about five  alcoholic drinks per week, but no history of ethanol abuse.   REVIEW OF SYSTEMS:  She has noted that she has gained some weight  recently.  She does have headaches and arthritis.  She has had some  blurry vision and some changes in her hearing recently, but she has not  had any double vision.  She denies any stroke or TIA symptoms.  She does  occasionally get some mild swelling in her legs.  No history of  excessive bleeding or bruising.  All other systems are negative.   PHYSICAL EXAMINATION:  GENERAL:  The patient is a well-appearing, 58-  year-old white female in no acute distress.  NEUROLOGY:  She is alert and oriented x3, appropriate and grossly  intact.  She is well-developed and well-nourished.  HEENT:  She is wearing glasses.  She has good dentition.  NECK:  Supple without thyromegaly, adenopathy, or bruits.  There is no  cervical, supraclavicular, axillary, or epitrochlear adenopathy.  LUNGS:  Clear  with equal breath sounds bilaterally.  HEART:  Regular rate and rhythm with normal S1 and S2.  No murmurs,  rubs, or gallops.  ABDOMEN:  Soft and nontender.  EXTREMITIES:  Without cyanosis, clubbing, or edema.  Skin is warm and  dry.   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  Her hemoglobin A1C is 5.4.  Her  cholesterol is 181 with HDL 58 and LDL 107.   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  This would appear to be  clinically stage IB based on CT and PET findings.  She has had a  bronchoscopy by Dr. Delford Field and that does not need to be repeated.  I  think given the normal appearance of her mediastinal nodes and  negative  PET findings, she does not need a mediastinoscopy.  I would recommend  proceeding with surgical resection with a VATS approach for a left upper  lobectomy.  It is possible that she might need a full thoracotomy.  There is one area where the mass is in proximity to the main pulmonary  artery of the left lung, although, I do not think that this is likely to  be an issue in terms of resection.  There does exist a slight  possibility of needing to perform a sleeve lobectomy or potentially a  pneumonectomy, but more than likely this mass can be removed with a left  upper lobectomy and probably with VATS approach with minimal rib  spreading.  I did discuss with her these issues and also reviewed the  films with her.  We did discuss that potentially chemotherapy and/or  radiation could potentially be recommended as an adjuvant therapy  following surgery depending on the final pathology.  I discussed in  detail with her the nature and extent of the operation including  expected hospital stay, expected overall recovery, and return to normal  function.  She does understand that the risks of surgery include, but  not limited to death, stroke, MI, DVT, PE, bleeding, possible need for  transfusion, infection, as well as other organ system dysfunction.  She  also understands that no guarantee can be given of cure and that the  likelihood depending on whether it is stage IA or IB would range from 50-  70% depending on the cell type.  Obviously the survival rate would be  significantly less if there is any nodal involvement.   Her pulmonary function testing is adequate to tolerate a lobectomy or  even a pneumonectomy if necessary, but again I do not think a  pneumonectomy is likely to be necessary.  Her FEV1 is 1.6 and actually  decreased after bronchodilators.  Her FVC is 1.9.  FEV1 to FVC ratio is  84.  Her MVV is 89.  Her diffusion capacity is 64.   The patient understands all of these  issues.  She has an appointment  scheduled with a surgeon at [facility] on March 3.  After  discussion, she would like to tentatively schedule a procedure and then  she will call our office to confirm or cancel once she has had a chance  to talk to the people at [facility].  In the meantime, I am going  to talk with Dr. Jonny Ruiz regarding her syncopal spell  to see if he thinks  any cardiac workup would be necessary prior to undergoing a major  operative procedure.   Salvatore Decent Dorris Fetch, M.D.  Electronically Signed   SCH/MEDQ  D:  11/22/2007  T:  11/23/2007  Job:  [id number]   cc:   Charlcie Cradle. Delford Field, MD, [facility]  Corwin Levins, MD

## 2011-02-14 NOTE — Assessment & Plan Note (Signed)
OFFICE VISIT   Tamara Ortiz, Tamara Ortiz  DOB:  05-20-1941                                        December 10, 2008  CHART #:  [id number]   The patient is a 70 year old woman who had a left upper lobectomy and  mediastinal lymph node dissection on December 10, 2007.  She is now a year  out from surgery.  She was clinically a stage IB, but up stage to IIB  pathologically and was treated with adjuvant chemotherapy.  She had a CT  in January and had missed the appointment at that time due to the  weather conditions and now comes in for a 1-year followup.  The patient  states that she has been doing well.  She does not have any cough,  hemoptysis, shortness of breath, or chest pain.  No new headaches or  visual changes.  She has lost some weight.  She has been exercising.  She was having a lot of trouble with paresthesias and that has improved  somewhat.   The patient medical history form is reviewed and is on the chart.  No  interval changes since her last visit.   CURRENT MEDICATIONS:  Diovan/hydrochlorothiazide 80/12.5 one-half tablet  daily, Lipitor 20 mg daily, Actonel 150 mg once a month, and simvastatin  80 mg nightly, gabapentin.  She takes Ambien p.r.n. for sleep.   ALLERGIES:  She has no known drug allergies.   PHYSICAL EXAMINATION:  GENERAL:  She is a 70 year old white female, in  no acute distress.  She is well developed and well nourished.  VITAL SIGNS:  Blood pressure is 122/75, pulse 72, respirations are 16,  ox saturation is 98% on room air.  LUNGS:  Clear with equal breath sounds bilaterally.  CARDIAC:  Has a regular rate and rhythm.  Normal S1 and S2.  No rubs,  murmurs, or gallops.  Her incisions are well healed.  NECK:  There is no cervical, supraclavicular, axillary, or epitrochlear  adenopathy.   Chest CT is reviewed and is stable.  There is no evidence of recurrent  tumor.   IMPRESSION:  The patient is doing well at this point in time.  She is  now a year out from left upper lobectomy.  She was treated with adjuvant  chemo for stage IIB cancer.  She has no evidence of recurrent disease.  She has an appointment with Dr. Arbutus Ped in April.  I will plan to see  her in April or May and she has a CT at that time.  I will plan to see  her back in August.  We just order a plain chest x-ray for now and we  can only suggest that if necessary.   Salvatore Decent Dorris Fetch, M.D.  Electronically Signed   SCH/MEDQ  D:  12/10/2008  T:  12/10/2008  Job:  [id number]   cc:   Lajuana Matte, MD  Corwin Levins, MD  Charlcie Cradle Delford Field, MD, FCCP

## 2011-02-21 ENCOUNTER — Other Ambulatory Visit: Payer: Self-pay | Admitting: Internal Medicine

## 2011-02-21 ENCOUNTER — Encounter (HOSPITAL_BASED_OUTPATIENT_CLINIC_OR_DEPARTMENT_OTHER): Payer: Medicare PPO | Admitting: Internal Medicine

## 2011-02-21 DIAGNOSIS — C3410 Malignant neoplasm of upper lobe, unspecified bronchus or lung: Secondary | ICD-10-CM

## 2011-02-21 DIAGNOSIS — C7951 Secondary malignant neoplasm of bone: Secondary | ICD-10-CM

## 2011-02-21 DIAGNOSIS — Z902 Acquired absence of lung [part of]: Secondary | ICD-10-CM

## 2011-02-21 DIAGNOSIS — C3490 Malignant neoplasm of unspecified part of unspecified bronchus or lung: Secondary | ICD-10-CM

## 2011-02-21 LAB — CBC WITH DIFFERENTIAL/PLATELET
Eosinophils Absolute: 0 10*3/uL (ref 0.0–0.5)
HCT: 37.1 % (ref 34.8–46.6)
LYMPH%: 34.2 % (ref 14.0–49.7)
MCV: 96.4 fL (ref 79.5–101.0)
MONO%: 11.1 % (ref 0.0–14.0)
NEUT#: 2.4 10*3/uL (ref 1.5–6.5)
NEUT%: 53.3 % (ref 38.4–76.8)
Platelets: 228 10*3/uL (ref 145–400)
RBC: 3.85 10*6/uL (ref 3.70–5.45)
nRBC: 0 % (ref 0–0)

## 2011-03-13 ENCOUNTER — Other Ambulatory Visit: Payer: Self-pay | Admitting: Internal Medicine

## 2011-03-13 ENCOUNTER — Encounter (HOSPITAL_BASED_OUTPATIENT_CLINIC_OR_DEPARTMENT_OTHER): Payer: Medicare PPO | Admitting: Internal Medicine

## 2011-03-13 LAB — COMPREHENSIVE METABOLIC PANEL
ALT: 29 U/L (ref 0–35)
AST: 30 U/L (ref 0–37)
Alkaline Phosphatase: 52 U/L (ref 39–117)
BUN: 14 mg/dL (ref 6–23)
Creatinine, Ser: 0.97 mg/dL (ref 0.50–1.10)
Total Bilirubin: 0.5 mg/dL (ref 0.3–1.2)

## 2011-03-13 LAB — CBC WITH DIFFERENTIAL/PLATELET
BASO%: 0.4 % (ref 0.0–2.0)
Basophils Absolute: 0 10*3/uL (ref 0.0–0.1)
EOS%: 0.6 % (ref 0.0–7.0)
HCT: 37.8 % (ref 34.8–46.6)
LYMPH%: 31.7 % (ref 14.0–49.7)
MCH: 32.6 pg (ref 25.1–34.0)
MCHC: 34.1 g/dL (ref 31.5–36.0)
MCV: 95.5 fL (ref 79.5–101.0)
MONO%: 6.9 % (ref 0.0–14.0)
NEUT%: 60.4 % (ref 38.4–76.8)
Platelets: 188 10*3/uL (ref 145–400)
lymph#: 2.2 10*3/uL (ref 0.9–3.3)

## 2011-04-10 ENCOUNTER — Other Ambulatory Visit: Payer: Self-pay | Admitting: Internal Medicine

## 2011-04-10 ENCOUNTER — Encounter (HOSPITAL_BASED_OUTPATIENT_CLINIC_OR_DEPARTMENT_OTHER): Payer: Medicare PPO | Admitting: Internal Medicine

## 2011-04-10 DIAGNOSIS — C3410 Malignant neoplasm of upper lobe, unspecified bronchus or lung: Secondary | ICD-10-CM

## 2011-04-10 DIAGNOSIS — Z902 Acquired absence of lung [part of]: Secondary | ICD-10-CM

## 2011-04-10 DIAGNOSIS — Z5111 Encounter for antineoplastic chemotherapy: Secondary | ICD-10-CM

## 2011-04-10 DIAGNOSIS — C7951 Secondary malignant neoplasm of bone: Secondary | ICD-10-CM

## 2011-04-10 DIAGNOSIS — C7952 Secondary malignant neoplasm of bone marrow: Secondary | ICD-10-CM

## 2011-04-10 LAB — CBC WITH DIFFERENTIAL/PLATELET
BASO%: 0.3 % (ref 0.0–2.0)
Basophils Absolute: 0 10*3/uL (ref 0.0–0.1)
EOS%: 0.5 % (ref 0.0–7.0)
HCT: 37.3 % (ref 34.8–46.6)
HGB: 12.9 g/dL (ref 11.6–15.9)
LYMPH%: 34.3 % (ref 14.0–49.7)
MCH: 32.5 pg (ref 25.1–34.0)
MCHC: 34.6 g/dL (ref 31.5–36.0)
MONO#: 0.4 10*3/uL (ref 0.1–0.9)
NEUT%: 57.9 % (ref 38.4–76.8)
Platelets: 200 10*3/uL (ref 145–400)

## 2011-04-10 LAB — COMPREHENSIVE METABOLIC PANEL
AST: 27 U/L (ref 0–37)
Albumin: 4.3 g/dL (ref 3.5–5.2)
BUN: 15 mg/dL (ref 6–23)
Calcium: 10.1 mg/dL (ref 8.4–10.5)
Chloride: 103 mEq/L (ref 96–112)
Creatinine, Ser: 0.92 mg/dL (ref 0.50–1.10)
Glucose, Bld: 99 mg/dL (ref 70–99)
Potassium: 4.3 mEq/L (ref 3.5–5.3)

## 2011-05-01 ENCOUNTER — Encounter (HOSPITAL_BASED_OUTPATIENT_CLINIC_OR_DEPARTMENT_OTHER): Payer: Medicare PPO | Admitting: Internal Medicine

## 2011-05-01 ENCOUNTER — Other Ambulatory Visit: Payer: Self-pay | Admitting: Internal Medicine

## 2011-05-01 DIAGNOSIS — Z902 Acquired absence of lung [part of]: Secondary | ICD-10-CM

## 2011-05-01 DIAGNOSIS — C7952 Secondary malignant neoplasm of bone marrow: Secondary | ICD-10-CM

## 2011-05-01 DIAGNOSIS — C3410 Malignant neoplasm of upper lobe, unspecified bronchus or lung: Secondary | ICD-10-CM

## 2011-05-01 DIAGNOSIS — Z5111 Encounter for antineoplastic chemotherapy: Secondary | ICD-10-CM

## 2011-05-01 LAB — BASIC METABOLIC PANEL
BUN: 14 mg/dL (ref 6–23)
CO2: 25 mEq/L (ref 19–32)
Chloride: 101 mEq/L (ref 96–112)
Glucose, Bld: 94 mg/dL (ref 70–99)
Potassium: 4 mEq/L (ref 3.5–5.3)
Sodium: 137 mEq/L (ref 135–145)

## 2011-05-01 LAB — CBC WITH DIFFERENTIAL/PLATELET
Basophils Absolute: 0 10*3/uL (ref 0.0–0.1)
Eosinophils Absolute: 0 10*3/uL (ref 0.0–0.5)
HCT: 35.3 % (ref 34.8–46.6)
HGB: 12.3 g/dL (ref 11.6–15.9)
MCH: 32.6 pg (ref 25.1–34.0)
MONO#: 0.4 10*3/uL (ref 0.1–0.9)
NEUT#: 2.6 10*3/uL (ref 1.5–6.5)
NEUT%: 54.9 % (ref 38.4–76.8)
RDW: 12.4 % (ref 11.2–14.5)
lymph#: 1.7 10*3/uL (ref 0.9–3.3)

## 2011-05-06 ENCOUNTER — Emergency Department (HOSPITAL_COMMUNITY): Payer: Medicare PPO

## 2011-05-06 ENCOUNTER — Emergency Department (HOSPITAL_COMMUNITY)
Admission: EM | Admit: 2011-05-06 | Discharge: 2011-05-06 | Disposition: A | Discharge status: Home / Self Care | Payer: Medicare PPO | Attending: Emergency Medicine | Admitting: Emergency Medicine

## 2011-05-06 DIAGNOSIS — I10 Essential (primary) hypertension: Secondary | ICD-10-CM | POA: Insufficient documentation

## 2011-05-06 DIAGNOSIS — N390 Urinary tract infection, site not specified: Secondary | ICD-10-CM | POA: Insufficient documentation

## 2011-05-06 DIAGNOSIS — M545 Low back pain, unspecified: Secondary | ICD-10-CM | POA: Insufficient documentation

## 2011-05-06 DIAGNOSIS — C3490 Malignant neoplasm of unspecified part of unspecified bronchus or lung: Secondary | ICD-10-CM | POA: Insufficient documentation

## 2011-05-06 DIAGNOSIS — E7800 Pure hypercholesterolemia, unspecified: Secondary | ICD-10-CM | POA: Insufficient documentation

## 2011-05-06 DIAGNOSIS — C7951 Secondary malignant neoplasm of bone: Secondary | ICD-10-CM | POA: Insufficient documentation

## 2011-05-06 LAB — URINALYSIS, ROUTINE W REFLEX MICROSCOPIC
Nitrite: NEGATIVE
Specific Gravity, Urine: 1.017 (ref 1.005–1.030)
Urobilinogen, UA: 0.2 mg/dL (ref 0.0–1.0)
pH: 6 (ref 5.0–8.0)

## 2011-05-06 LAB — DIFFERENTIAL
Basophils Relative: 1 % (ref 0–1)
Eosinophils Absolute: 0 10*3/uL (ref 0.0–0.7)
Eosinophils Relative: 0 % (ref 0–5)
Lymphs Abs: 2.3 10*3/uL (ref 0.7–4.0)
Monocytes Absolute: 0.1 10*3/uL (ref 0.1–1.0)
Monocytes Relative: 1 % — ABNORMAL LOW (ref 3–12)
Neutrophils Relative %: 56 % (ref 43–77)

## 2011-05-06 LAB — COMPREHENSIVE METABOLIC PANEL
AST: 32 U/L (ref 0–37)
CO2: 29 mEq/L (ref 19–32)
Calcium: 10.4 mg/dL (ref 8.4–10.5)
Creatinine, Ser: 0.97 mg/dL (ref 0.50–1.10)
GFR calc Af Amer: >60 mL/min (ref >60)
GFR calc non Af Amer: 57 mL/min — ABNORMAL LOW (ref >60)
Glucose, Bld: 100 mg/dL — ABNORMAL HIGH (ref 70–99)
Total Protein: 7.5 g/dL (ref 6.0–8.3)

## 2011-05-06 LAB — CBC
MCH: 33 pg (ref 26.0–34.0)
MCHC: 35.2 g/dL (ref 30.0–36.0)
MCV: 93.9 fL (ref 78.0–100.0)
Platelets: 268 10*3/uL (ref 150–400)
RBC: 4.27 MIL/uL (ref 3.87–5.11)

## 2011-05-06 LAB — URINE MICROSCOPIC-ADD ON

## 2011-05-08 ENCOUNTER — Other Ambulatory Visit: Payer: Self-pay | Admitting: Internal Medicine

## 2011-05-08 ENCOUNTER — Encounter (HOSPITAL_BASED_OUTPATIENT_CLINIC_OR_DEPARTMENT_OTHER): Payer: Medicare PPO | Admitting: Internal Medicine

## 2011-05-08 DIAGNOSIS — C7951 Secondary malignant neoplasm of bone: Secondary | ICD-10-CM

## 2011-05-08 DIAGNOSIS — C3410 Malignant neoplasm of upper lobe, unspecified bronchus or lung: Secondary | ICD-10-CM

## 2011-05-08 LAB — BASIC METABOLIC PANEL
BUN: 16 mg/dL (ref 6–23)
Calcium: 10.4 mg/dL (ref 8.4–10.5)
Creatinine, Ser: 0.91 mg/dL (ref 0.50–1.10)

## 2011-05-12 ENCOUNTER — Other Ambulatory Visit: Payer: Self-pay | Admitting: Internal Medicine

## 2011-05-12 ENCOUNTER — Ambulatory Visit (HOSPITAL_COMMUNITY)
Admission: RE | Admit: 2011-05-12 | Discharge: 2011-05-12 | Disposition: A | Discharge status: Home / Self Care | Payer: Medicare PPO | Source: Ambulatory Visit | Attending: Internal Medicine | Admitting: Internal Medicine

## 2011-05-12 ENCOUNTER — Encounter (HOSPITAL_COMMUNITY): Payer: Self-pay

## 2011-05-12 ENCOUNTER — Other Ambulatory Visit (HOSPITAL_COMMUNITY): Payer: Medicare PPO

## 2011-05-12 DIAGNOSIS — C3490 Malignant neoplasm of unspecified part of unspecified bronchus or lung: Secondary | ICD-10-CM

## 2011-05-12 DIAGNOSIS — M949 Disorder of cartilage, unspecified: Secondary | ICD-10-CM | POA: Insufficient documentation

## 2011-05-12 DIAGNOSIS — M899 Disorder of bone, unspecified: Secondary | ICD-10-CM | POA: Insufficient documentation

## 2011-05-12 MED ORDER — IOHEXOL 300 MG/ML  SOLN
100.0000 mL | Freq: Once | INTRAMUSCULAR | Status: AC | PRN
  Administered 2011-05-12: 100 mL via INTRAVENOUS

## 2011-05-29 ENCOUNTER — Other Ambulatory Visit: Payer: Self-pay | Admitting: Internal Medicine

## 2011-05-29 ENCOUNTER — Encounter (HOSPITAL_BASED_OUTPATIENT_CLINIC_OR_DEPARTMENT_OTHER): Payer: Medicare PPO | Admitting: Internal Medicine

## 2011-05-29 DIAGNOSIS — Z5111 Encounter for antineoplastic chemotherapy: Secondary | ICD-10-CM

## 2011-05-29 DIAGNOSIS — Z902 Acquired absence of lung [part of]: Secondary | ICD-10-CM

## 2011-05-29 DIAGNOSIS — C7952 Secondary malignant neoplasm of bone marrow: Secondary | ICD-10-CM

## 2011-05-29 DIAGNOSIS — C3410 Malignant neoplasm of upper lobe, unspecified bronchus or lung: Secondary | ICD-10-CM

## 2011-05-29 LAB — COMPREHENSIVE METABOLIC PANEL
ALT: 28 U/L (ref 0–35)
AST: 31 U/L (ref 0–37)
Albumin: 4.5 g/dL (ref 3.5–5.2)
CO2: 30 mEq/L (ref 19–32)
Calcium: 10.9 mg/dL — ABNORMAL HIGH (ref 8.4–10.5)
Chloride: 102 mEq/L (ref 96–112)
Potassium: 4.2 mEq/L (ref 3.5–5.3)

## 2011-05-29 LAB — CBC WITH DIFFERENTIAL/PLATELET
BASO%: 0.5 % (ref 0.0–2.0)
Eosinophils Absolute: 0.1 10*3/uL (ref 0.0–0.5)
HCT: 33.8 % — ABNORMAL LOW (ref 34.8–46.6)
MCHC: 34 g/dL (ref 31.5–36.0)
MONO#: 0.4 10*3/uL (ref 0.1–0.9)
NEUT#: 1.9 10*3/uL (ref 1.5–6.5)
NEUT%: 48.9 % (ref 38.4–76.8)
RBC: 3.59 10*6/uL — ABNORMAL LOW (ref 3.70–5.45)
WBC: 3.8 10*3/uL — ABNORMAL LOW (ref 3.9–10.3)
lymph#: 1.5 10*3/uL (ref 0.9–3.3)
nRBC: 0 % (ref 0–0)

## 2011-05-31 ENCOUNTER — Telehealth: Payer: Self-pay

## 2011-05-31 DIAGNOSIS — Z0000 Encounter for general adult medical examination without abnormal findings: Secondary | ICD-10-CM

## 2011-05-31 NOTE — Telephone Encounter (Signed)
A user error has taken place: encounter opened in error, closed for administrative reasons.

## 2011-06-22 LAB — LIPID PANEL
Cholesterol: 181
HDL: 58
LDL Cholesterol: 107 — ABNORMAL HIGH
Total CHOL/HDL Ratio: 3.1
Triglycerides: 81
VLDL: 16

## 2011-06-22 LAB — HEPATIC FUNCTION PANEL
ALT: 16
AST: 16
Albumin: 3.3 — ABNORMAL LOW
Alkaline Phosphatase: 43
Bilirubin, Direct: 0.1
Indirect Bilirubin: 0.8
Total Bilirubin: 0.9
Total Protein: 5.7 — ABNORMAL LOW

## 2011-06-22 LAB — HEMOGLOBIN A1C
Hgb A1c MFr Bld: 5.4
Mean Plasma Glucose: 115

## 2011-06-22 LAB — POCT CARDIAC MARKERS
CKMB, poc: <1 — ABNORMAL LOW
CKMB, poc: <1 — ABNORMAL LOW
Myoglobin, poc: 62.6
Myoglobin, poc: 63.7
Operator id: 284251
Operator id: 284251
Troponin i, poc: <0.05
Troponin i, poc: <0.05

## 2011-06-22 LAB — DIFFERENTIAL
Basophils Absolute: 0.1
Basophils Relative: 1
Eosinophils Absolute: 0
Eosinophils Relative: 0
Lymphocytes Relative: 21
Lymphs Abs: 1.6
Monocytes Absolute: 0.3
Monocytes Relative: 4
Neutro Abs: 5.5
Neutrophils Relative %: 74

## 2011-06-22 LAB — BASIC METABOLIC PANEL WITH GFR
BUN: 12
CO2: 29
Chloride: 100
Glucose, Bld: 104 — ABNORMAL HIGH
Potassium: 3.9
Sodium: 136

## 2011-06-22 LAB — APTT: aPTT: 22 — ABNORMAL LOW

## 2011-06-22 LAB — D-DIMER, QUANTITATIVE: D-Dimer, Quant: 1 — ABNORMAL HIGH

## 2011-06-22 LAB — PROTIME-INR
INR: 1
Prothrombin Time: 13

## 2011-06-22 LAB — BASIC METABOLIC PANEL
Calcium: 8.8
Creatinine, Ser: 0.8
GFR calc Af Amer: >60
GFR calc non Af Amer: >60

## 2011-06-22 LAB — CARDIAC PANEL(CRET KIN+CKTOT+MB+TROPI)
CK, MB: 0.6
Relative Index: INVALID
Total CK: 57
Troponin I: 0.01
Troponin I: 0.01

## 2011-06-22 LAB — CBC
HCT: 40.9
Hemoglobin: 14
MCHC: 34.3
MCV: 93.5
Platelets: 234
RBC: 4.38
RDW: 12.2
WBC: 7.4

## 2011-06-23 LAB — CULTURE, RESPIRATORY W GRAM STAIN: Gram Stain: NONE SEEN

## 2011-06-23 LAB — FUNGUS CULTURE W SMEAR

## 2011-06-23 LAB — AFB CULTURE WITH SMEAR (NOT AT ARMC)

## 2011-06-26 ENCOUNTER — Other Ambulatory Visit: Payer: Self-pay | Admitting: Internal Medicine

## 2011-06-26 ENCOUNTER — Encounter (HOSPITAL_BASED_OUTPATIENT_CLINIC_OR_DEPARTMENT_OTHER): Payer: Medicare PPO | Admitting: Internal Medicine

## 2011-06-26 DIAGNOSIS — Z902 Acquired absence of lung [part of]: Secondary | ICD-10-CM

## 2011-06-26 DIAGNOSIS — C3410 Malignant neoplasm of upper lobe, unspecified bronchus or lung: Secondary | ICD-10-CM

## 2011-06-26 DIAGNOSIS — Z5111 Encounter for antineoplastic chemotherapy: Secondary | ICD-10-CM

## 2011-06-26 DIAGNOSIS — C7951 Secondary malignant neoplasm of bone: Secondary | ICD-10-CM

## 2011-06-26 LAB — CBC WITH DIFFERENTIAL/PLATELET
BASO%: 0.8 % (ref 0.0–2.0)
Basophils Absolute: 0 10*3/uL (ref 0.0–0.1)
EOS%: 0.8 % (ref 0.0–7.0)
HCT: 37.2 % (ref 34.8–46.6)
MCH: 32.7 pg (ref 25.1–34.0)
MCHC: 34.7 g/dL (ref 31.5–36.0)
MCV: 94.2 fL (ref 79.5–101.0)
MONO%: 10.8 % (ref 0.0–14.0)
NEUT%: 50.6 % (ref 38.4–76.8)
RDW: 13.3 % (ref 11.2–14.5)
lymph#: 1.5 10*3/uL (ref 0.9–3.3)

## 2011-06-26 LAB — COMPREHENSIVE METABOLIC PANEL
ALT: 25 U/L (ref 0–35)
AST: 33 U/L (ref 0–37)
Albumin: 2.6 — ABNORMAL LOW
Albumin: 4.1
Alkaline Phosphatase: 54
Alkaline Phosphatase: 61 U/L (ref 39–117)
BUN: 11
BUN: 6
Calcium: 9.5
Creatinine, Ser: 0.68
Creatinine, Ser: 0.71
Creatinine, Ser: 1.02 mg/dL (ref 0.50–1.10)
Glucose, Bld: 97
Total Bilirubin: 0.6
Total Bilirubin: 0.6 mg/dL (ref 0.3–1.2)
Total Protein: 4.9 — ABNORMAL LOW
Total Protein: 6.9

## 2011-06-26 LAB — CBC
HCT: 28.3 — ABNORMAL LOW
HCT: 40.7
HCT: 29.1 — ABNORMAL LOW
HCT: 31.9 — ABNORMAL LOW
Hemoglobin: 14.1
Hemoglobin: 10 — ABNORMAL LOW
Hemoglobin: 11 — ABNORMAL LOW
MCHC: 34.6
MCHC: 34.6
MCHC: 35.1
MCHC: 34.6
MCV: 92.8
MCV: 92.8
MCV: 92.8
Platelets: 187
Platelets: 191
Platelets: 244
RDW: 11.9
RDW: 12.2
RDW: 12.4
RDW: 12.1
RDW: 12.2

## 2011-06-26 LAB — BASIC METABOLIC PANEL
BUN: 8
CO2: 27
CO2: 30
Calcium: 7.8 — ABNORMAL LOW
Creatinine, Ser: 0.62
GFR calc non Af Amer: >60
Glucose, Bld: 140 — ABNORMAL HIGH
Glucose, Bld: 101 — ABNORMAL HIGH
Potassium: 4.3
Sodium: 140

## 2011-06-26 LAB — URINE MICROSCOPIC-ADD ON

## 2011-06-26 LAB — BLOOD GAS, ARTERIAL
Bicarbonate: 25.8 — ABNORMAL HIGH
Bicarbonate: 26.9 — ABNORMAL HIGH
O2 Content: 2
O2 Saturation: 96.8
O2 Saturation: 99
Patient temperature: 98.6
Patient temperature: 98.6
TCO2: 27.1
pH, Arterial: 7.395

## 2011-06-26 LAB — TYPE AND SCREEN: ABO/RH(D): A POS

## 2011-06-26 LAB — URINALYSIS, ROUTINE W REFLEX MICROSCOPIC
Hgb urine dipstick: NEGATIVE
Nitrite: NEGATIVE
Protein, ur: NEGATIVE
Specific Gravity, Urine: 1.012
Urobilinogen, UA: 0.2

## 2011-06-26 LAB — DIFFERENTIAL
Basophils Absolute: 0.1
Basophils Relative: 1
Eosinophils Absolute: 0
Eosinophils Relative: 1
Monocytes Absolute: 0.5
Neutro Abs: 5.5

## 2011-06-26 LAB — POCT I-STAT 7, (LYTES, BLD GAS, ICA,H+H)
Acid-Base Excess: 2
Calcium, Ion: 1.17
HCT: 30 — ABNORMAL LOW
Hemoglobin: 10.2 — ABNORMAL LOW
Operator id: 119851
Sodium: 135
pH, Arterial: 7.466 — ABNORMAL HIGH

## 2011-06-26 LAB — PROTIME-INR
INR: 0.9
Prothrombin Time: 12.5

## 2011-06-26 LAB — APTT: aPTT: 24

## 2011-06-26 LAB — ABO/RH: ABO/RH(D): A POS

## 2011-06-27 ENCOUNTER — Encounter: Payer: Self-pay | Admitting: Internal Medicine

## 2011-06-29 ENCOUNTER — Telehealth: Payer: Self-pay | Admitting: Critical Care Medicine

## 2011-06-29 NOTE — Telephone Encounter (Signed)
I spoke with pt and she wanted to know if we had she could not have use anesthesia in her file. I advised her according to our allergies we did not. Pt states she is going to call [facility] at [facility] and get her records

## 2011-06-29 NOTE — Telephone Encounter (Signed)
Pt has only seen PW once in Feb 2009.  ATC pt to get more info but mailbox was full and unable to accept new messages.  WCB

## 2011-07-03 LAB — POCT I-STAT, CHEM 8
Calcium, Ion: 1.11 — ABNORMAL LOW
Chloride: 106
HCT: 38
Hemoglobin: 12.9
Potassium: 3.8

## 2011-07-03 LAB — URINALYSIS, ROUTINE W REFLEX MICROSCOPIC
Bilirubin Urine: NEGATIVE
Glucose, UA: NEGATIVE
Ketones, ur: NEGATIVE
Specific Gravity, Urine: 1.038 — ABNORMAL HIGH
pH: 7.5

## 2011-07-03 LAB — URINE MICROSCOPIC-ADD ON

## 2011-07-03 LAB — SAMPLE TO BLOOD BANK

## 2011-07-07 ENCOUNTER — Other Ambulatory Visit (INDEPENDENT_AMBULATORY_CARE_PROVIDER_SITE_OTHER): Payer: Medicare PPO

## 2011-07-07 ENCOUNTER — Other Ambulatory Visit: Payer: Self-pay | Admitting: Internal Medicine

## 2011-07-07 DIAGNOSIS — I10 Essential (primary) hypertension: Secondary | ICD-10-CM

## 2011-07-07 DIAGNOSIS — Z0000 Encounter for general adult medical examination without abnormal findings: Secondary | ICD-10-CM

## 2011-07-07 LAB — HEPATIC FUNCTION PANEL
Alkaline Phosphatase: 55 U/L (ref 39–117)
Bilirubin, Direct: 0.1 mg/dL (ref 0.0–0.3)
Total Bilirubin: 0.8 mg/dL (ref 0.3–1.2)
Total Protein: 6.8 g/dL (ref 6.0–8.3)

## 2011-07-07 LAB — CBC WITH DIFFERENTIAL/PLATELET
Basophils Absolute: 0 10*3/uL (ref 0.0–0.1)
Basophils Relative: 0.5 % (ref 0.0–3.0)
Eosinophils Absolute: 0 10*3/uL (ref 0.0–0.7)
Lymphocytes Relative: 61.1 % — ABNORMAL HIGH (ref 12.0–46.0)
MCHC: 34.2 g/dL (ref 30.0–36.0)
Monocytes Absolute: 0.5 10*3/uL (ref 0.1–1.0)
Neutrophils Relative %: 19.3 % — ABNORMAL LOW (ref 43.0–77.0)
Platelets: 165 10*3/uL (ref 150.0–400.0)
RBC: 3.6 Mil/uL — ABNORMAL LOW (ref 3.87–5.11)
WBC: 2.5 10*3/uL — ABNORMAL LOW (ref 4.5–10.5)

## 2011-07-07 LAB — URINALYSIS, ROUTINE W REFLEX MICROSCOPIC
Nitrite: NEGATIVE
Specific Gravity, Urine: 1.015 (ref 1.000–1.030)
Total Protein, Urine: NEGATIVE
Urine Glucose: NEGATIVE
pH: 7.5 (ref 5.0–8.0)

## 2011-07-07 LAB — LIPID PANEL
Cholesterol: 211 mg/dL — ABNORMAL HIGH (ref 0–200)
Total CHOL/HDL Ratio: 3
Triglycerides: 74 mg/dL (ref 0.0–149.0)
VLDL: 14.8 mg/dL (ref 0.0–40.0)

## 2011-07-07 LAB — BASIC METABOLIC PANEL
BUN: 12 mg/dL (ref 6–23)
Chloride: 102 mEq/L (ref 96–112)
GFR: 50.92 mL/min — ABNORMAL LOW (ref >60.00)
Potassium: 4 mEq/L (ref 3.5–5.1)
Sodium: 139 mEq/L (ref 135–145)

## 2011-07-12 ENCOUNTER — Ambulatory Visit (INDEPENDENT_AMBULATORY_CARE_PROVIDER_SITE_OTHER): Payer: Medicare PPO | Admitting: Internal Medicine

## 2011-07-12 ENCOUNTER — Encounter: Payer: Self-pay | Admitting: Internal Medicine

## 2011-07-12 VITALS — BP 100/62 | HR 60 | Temp 97.4°F | Ht 65.0 in | Wt 147.1 lb

## 2011-07-12 DIAGNOSIS — I10 Essential (primary) hypertension: Secondary | ICD-10-CM

## 2011-07-12 DIAGNOSIS — Z0000 Encounter for general adult medical examination without abnormal findings: Secondary | ICD-10-CM

## 2011-07-12 DIAGNOSIS — Z23 Encounter for immunization: Secondary | ICD-10-CM

## 2011-07-12 MED ORDER — LOSARTAN POTASSIUM-HCTZ 50-12.5 MG PO TABS: 1.0000 | ORAL_TABLET | Freq: Every day | ORAL | Status: DC

## 2011-07-12 MED ORDER — ZOLPIDEM TARTRATE ER 12.5 MG PO TBCR: 12.5000 mg | EXTENDED_RELEASE_TABLET | Freq: Every evening | ORAL | Status: DC | PRN

## 2011-07-12 MED ORDER — SIMVASTATIN 40 MG PO TABS: 40.0000 mg | ORAL_TABLET | Freq: Every evening | ORAL | Status: DC

## 2011-07-12 NOTE — Patient Instructions (Addendum)
You had the flu shot today OK to decrease the losartan HCT to the 50/12.5 mg [facility], except for the ambien CR (given hardcopy) Continue all other medications as before Please return in 6 months, or sooner if needed Please return sooner if you have weight loss or dizziness, or worsening constipation, as you medications at that point may become too strong

## 2011-07-12 NOTE — Progress Notes (Signed)
Subjective:    Patient ID: Tamara Ortiz, female    DOB: 08/05/1941, 70 y.o.   MRN: [id number]  HPI  Here for wellness and f/u;  Overall doing ok;  Pt denies CP, worsening SOB, DOE, wheezing, orthopnea, PND, worsening LE edema, palpitations, dizziness or syncope.  Pt denies neurological change such as new Headache, facial or extremity weakness.  Pt denies polydipsia, polyuria, or low sugar symptoms. Pt states overall good compliance with treatment and medications, good tolerability, and trying to follow lower cholesterol diet.  Pt denies worsening depressive symptoms, suicidal ideation or panic. No fever, wt loss, night sweats, loss of appetite, or other constitutional symptoms.  Pt states good ability with ADL's, low fall risk, home safety reviewed and adequate, no significant changes in hearing or vision, and occasionally active with exercise.  Has been eating more dairy products for calcium, and she is aware it is higher in ldl chol as well.  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   Past Medical History  Diagnosis Date  . Hypertension   . HYPERLIPIDEMIA 11/05/2007  . ANXIETY 05/13/2009  . DEPRESSIVE DISORDER 05/06/2008  . HYPERTENSION 11/05/2007  . ALLERGIC RHINITIS 11/05/2007  . CONSTIPATION, CHRONIC 05/13/2009  . UTI 05/06/2008  . BACK PAIN 05/11/2008  . OSTEOPOROSIS 11/05/2007  . INSOMNIA-SLEEP DISORDER-UNSPEC 05/13/2009  . FATIGUE 05/13/2009  . Swelling, mass, or lump in chest 11/05/2007  . LUNG CANCER, HX OF 05/06/2008  . Cancer     lung ca dx'd 2009  . 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 Tubal ligation   . S/p lul lung surgury 12/2007    reports that she has never smoked. She does not have any smokeless tobacco history on file. She reports that she drinks alcohol. She reports that she does not use illicit drugs. family history includes ALS in her father; Alcohol abuse in her  son; and Heart disease in her mother. Allergies  Allergen Reactions  . Amoxicillin     REACTION: hives/itch  . Ciprofloxacin     REACTION: red, itch  . Penicillins     REACTION: hives/itch   Current Outpatient Prescriptions on File Prior to Visit  Medication Sig Dispense Refill  . aspirin 81 MG EC tablet Take 81 mg by mouth daily.        . Calcium Carbonate-Vitamin D (CALCIUM-VITAMIN D) 500-200 MG-UNIT per tablet Take 1 tablet by mouth 2 (two) times daily.        . Multiple Vitamin (ANTIOXIDANT A/C/E PO) Take by mouth daily.        . Omega-3 350 MG CAPS Take by mouth daily.        . polyethylene glycol powder (MIRALAX) powder Take 17 g by mouth daily.        . vitamin E ([employer]S VITAMIN E) 400 UNIT capsule Take 400 Units by mouth daily.        [name] zolendronic acid (ZOMETA) 4 MG/5ML injection Inject 4 mg into the vein once.        . vitamin B-12 (CYANOCOBALAMIN) 100 MCG tablet Take 50 mcg by mouth daily.         Review of Systems Review of Systems  Constitutional: Negative for diaphoresis, activity change, appetite change and unexpected weight change.  HENT: Negative for hearing loss, ear pain, facial swelling, mouth sores and neck  stiffness.   Eyes: Negative for pain, redness and visual disturbance.  Respiratory: Negative for shortness of breath and wheezing.   Cardiovascular: Negative for chest pain and palpitations.  Gastrointestinal: Negative for diarrhea, blood in stool, abdominal distention and rectal pain.  Genitourinary: Negative for hematuria, flank pain and decreased urine volume.  Musculoskeletal: Negative for myalgias and joint swelling.  Skin: Negative for color change and wound.  Neurological: Negative for syncope and numbness.  Hematological: Negative for adenopathy.  Psychiatric/Behavioral: Negative for hallucinations, self-injury, decreased concentration and agitation.      Objective:   Physical Exam BP 100/62  Pulse 60  Temp(Src) 97.4 F (36.3 C) (Oral)  Ht 5\' 5"   (1.651 m)  Wt 147 lb 2 oz (66.735 kg)  BMI 24.48 kg/m2  SpO2 98% Physical Exam  VS noted, not ill appearing, but has lost some visible wt, hair has thinned diffusely, some increased frailty Constitutional: Pt is oriented to person, place, and time. Appears well-developed and well-nourished.  HENT:  Head: Normocephalic and atraumatic.  Right Ear: External ear normal.  Left Ear: External ear normal.  Nose: Nose normal.  Mouth/Throat: Oropharynx is clear and moist.  Eyes: Conjunctivae and EOM are normal. Pupils are equal, round, and reactive to light.  Neck: Normal range of motion. Neck supple. No JVD present. No tracheal deviation present.  Cardiovascular: Normal rate, regular rhythm, normal heart sounds and intact distal pulses.   Pulmonary/Chest: Effort normal and breath sounds normal.  Abdominal: Soft. Bowel sounds are normal. There is no tenderness.  Musculoskeletal: Normal range of motion. Exhibits no edema.  Lymphadenopathy:  Has no cervical adenopathy.  Neurological: Pt is alert and oriented to person, place, and time. Pt has normal reflexes. No cranial nerve deficit.  Skin: Skin is warm and dry. No rash noted.  Psychiatric:  Has  normal mood and affect. Behavior is normal. 1+ nervous    Assessment & Plan:

## 2011-07-12 NOTE — Assessment & Plan Note (Signed)

## 2011-07-13 ENCOUNTER — Encounter: Payer: Self-pay | Admitting: *Deleted

## 2011-07-13 ENCOUNTER — Encounter: Payer: Self-pay | Admitting: Internal Medicine

## 2011-07-13 NOTE — Assessment & Plan Note (Signed)
Ok to decrease the losartan due to borderline low BP, wt loss, and anticipation of possible more wt loss

## 2011-07-17 ENCOUNTER — Other Ambulatory Visit: Payer: Self-pay | Admitting: Internal Medicine

## 2011-07-17 ENCOUNTER — Encounter (HOSPITAL_BASED_OUTPATIENT_CLINIC_OR_DEPARTMENT_OTHER): Payer: Medicare HMO | Admitting: Internal Medicine

## 2011-07-17 DIAGNOSIS — C7952 Secondary malignant neoplasm of bone marrow: Secondary | ICD-10-CM

## 2011-07-17 DIAGNOSIS — Z5111 Encounter for antineoplastic chemotherapy: Secondary | ICD-10-CM

## 2011-07-17 DIAGNOSIS — C3410 Malignant neoplasm of upper lobe, unspecified bronchus or lung: Secondary | ICD-10-CM

## 2011-07-17 DIAGNOSIS — C3490 Malignant neoplasm of unspecified part of unspecified bronchus or lung: Secondary | ICD-10-CM

## 2011-07-17 LAB — BASIC METABOLIC PANEL
BUN: 16 mg/dL (ref 6–23)
Creatinine, Ser: 0.98 mg/dL (ref 0.50–1.10)
Glucose, Bld: 92 mg/dL (ref 70–99)
Potassium: 4.1 mEq/L (ref 3.5–5.3)

## 2011-07-26 ENCOUNTER — Encounter (HOSPITAL_BASED_OUTPATIENT_CLINIC_OR_DEPARTMENT_OTHER): Payer: Medicare PPO | Admitting: Internal Medicine

## 2011-07-26 ENCOUNTER — Other Ambulatory Visit: Payer: Self-pay | Admitting: Internal Medicine

## 2011-07-26 DIAGNOSIS — Z5111 Encounter for antineoplastic chemotherapy: Secondary | ICD-10-CM

## 2011-07-26 DIAGNOSIS — C7951 Secondary malignant neoplasm of bone: Secondary | ICD-10-CM

## 2011-07-26 DIAGNOSIS — C3410 Malignant neoplasm of upper lobe, unspecified bronchus or lung: Secondary | ICD-10-CM

## 2011-07-26 LAB — CBC WITH DIFFERENTIAL/PLATELET
BASO%: 0.7 % (ref 0.0–2.0)
LYMPH%: 33.9 % (ref 14.0–49.7)
MCHC: 34.9 g/dL (ref 31.5–36.0)
MCV: 97.3 fL (ref 79.5–101.0)
MONO#: 0.5 10*3/uL (ref 0.1–0.9)
MONO%: 17.4 % — ABNORMAL HIGH (ref 0.0–14.0)
NEUT#: 1.3 10*3/uL — ABNORMAL LOW (ref 1.5–6.5)
Platelets: 174 10*3/uL (ref 145–400)
RBC: 3.42 10*6/uL — ABNORMAL LOW (ref 3.70–5.45)
RDW: 12.9 % (ref 11.2–14.5)
WBC: 2.7 10*3/uL — ABNORMAL LOW (ref 3.9–10.3)

## 2011-07-26 LAB — COMPREHENSIVE METABOLIC PANEL
ALT: 31 U/L (ref 0–35)
Albumin: 4.3 g/dL (ref 3.5–5.2)
Alkaline Phosphatase: 59 U/L (ref 39–117)
Potassium: 3.7 mEq/L (ref 3.5–5.3)
Sodium: 140 mEq/L (ref 135–145)
Total Bilirubin: 0.4 mg/dL (ref 0.3–1.2)
Total Protein: 6.7 g/dL (ref 6.0–8.3)

## 2011-07-27 ENCOUNTER — Encounter: Payer: Self-pay | Admitting: Endocrinology

## 2011-07-27 ENCOUNTER — Ambulatory Visit (INDEPENDENT_AMBULATORY_CARE_PROVIDER_SITE_OTHER): Payer: Medicare PPO | Admitting: Endocrinology

## 2011-07-27 VITALS — BP 122/72 | HR 75 | Temp 98.9°F | Ht 65.0 in | Wt 147.0 lb

## 2011-07-27 DIAGNOSIS — J209 Acute bronchitis, unspecified: Secondary | ICD-10-CM

## 2011-07-27 MED ORDER — AZITHROMYCIN 500 MG PO TABS: 500.0000 mg | ORAL_TABLET | Freq: Every day | ORAL | Status: AC

## 2011-07-27 NOTE — Patient Instructions (Signed)
i have sent a prescription to your pharmacy Loratadine-d (non-prescription) will help your congestion. I hope you feel better soon.  If you don't feel better by next week, please call your doctor.

## 2011-07-27 NOTE — Progress Notes (Signed)
Subjective:    Patient ID: Tamara Ortiz, female    DOB: [birth year], 70 y.o.   MRN: [id number]  HPI Pt state a few days of slight dry cough in the chest, and assoc nasal congestion.  No earache.   Past Medical History  Diagnosis Date  . Hypertension   . HYPERLIPIDEMIA 11/05/2007  . ANXIETY 05/13/2009  . DEPRESSIVE DISORDER 05/06/2008  . HYPERTENSION 11/05/2007  . ALLERGIC RHINITIS 11/05/2007  . CONSTIPATION, CHRONIC 05/13/2009  . UTI 05/06/2008  . BACK PAIN 05/11/2008  . OSTEOPOROSIS 11/05/2007  . INSOMNIA-SLEEP DISORDER-UNSPEC 05/13/2009  . FATIGUE 05/13/2009  . Swelling, mass, or lump in chest 11/05/2007  . LUNG CANCER, HX OF 05/06/2008  . Metastasis dx'd /2011    to bone (spine & hip)  . Lung cancer 2009    Past Surgical History  Procedure Date  . Tubal ligation   . S/p lul lung surgury 12/2007  . Lobectomy     History   Social History  . Marital Status: Divorced    Spouse Name: N/A    Number of Children: N/A  . Years of Education: N/A   Occupational History  . 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 Smoking status: Never Smoker   . Smokeless tobacco: Not on file  . Alcohol Use: Yes  . Drug Use: No  . Sexually Active: Not on file   Other Topics Concern  . Not on file   Social History Narrative  . No narrative on file    Current Outpatient Prescriptions on File Prior to Visit  Medication Sig Dispense Refill  . aspirin 81 MG EC tablet Take 81 mg by mouth daily.        . Calcium Carbonate-Vitamin D (CALCIUM-VITAMIN D) 500-200 MG-UNIT per tablet Take 1 tablet by mouth 2 (two) times daily.        . Flaxseed, Linseed, (FLAXSEED OIL) 1000 MG CAPS Take 1,000 mg by mouth daily.        [name] LORazepam (ATIVAN) 0.5 MG tablet Take 0.5 mg by mouth every 8 (eight) hours as needed.        [name] losartan-hydrochlorothiazide (HYZAAR) 50-12.5 MG per tablet Take 1 tablet by mouth daily.  90 tablet  3  . Multiple  Vitamin (ANTIOXIDANT A/C/E PO) Take by mouth daily.        . Omega-3 350 MG CAPS Take by mouth daily.        . polyethylene glycol powder (MIRALAX) powder Take 17 g by mouth daily.        . simvastatin (ZOCOR) 40 MG tablet Take 1 tablet (40 mg total) by mouth every evening.  90 tablet  3  . vitamin B-12 (CYANOCOBALAMIN) 100 MCG tablet Take 50 mcg by mouth daily.        . vitamin E ([employer]S VITAMIN E) 400 UNIT capsule Take 400 Units by mouth daily.        [name] zolendronic acid (ZOMETA) 4 MG/5ML injection Inject 4 mg into the vein once.        [name] zolpidem (AMBIEN CR) 12.5 MG CR tablet Take 1 tablet (12.5 mg total) by mouth at bedtime as needed.  90 tablet  1    Allergies  Allergen Reactions  . Amoxicillin     REACTION: hives/itch  . Ciprofloxacin     REACTION: red, itch  . Penicillins     REACTION: hives/itch  Family History  Problem Relation Age of Onset  . Heart disease Mother   . ALS Father   . Alcohol abuse Son     ETOH    BP 122/72  Pulse 75  Temp(Src) 98.9 F (37.2 C) (Oral)  Ht 5\' 5"  (1.651 m)  Wt 147 lb (66.679 kg)  BMI 24.46 kg/m2  SpO2 98%    Review of Systems She has headache, but no fever.      Objective:   Physical Exam VITAL SIGNS:  See vs page GENERAL: no distress head: no deformity eyes: no periorbital swelling, no proptosis external nose and ears are normal mouth: no lesion seen Both eac's and tm's are normal NECK: There is no palpable thyroid enlargement.  No thyroid nodule is palpable.  No palpable lymphadenopathy at the anterior neck. LUNGS:  Clear to auscultation       Assessment & Plan:  Acute bronchitis, new

## 2011-08-01 ENCOUNTER — Encounter: Payer: Self-pay | Admitting: Internal Medicine

## 2011-08-08 ENCOUNTER — Other Ambulatory Visit: Payer: Medicare PPO | Admitting: Internal Medicine

## 2011-08-09 ENCOUNTER — Ambulatory Visit (HOSPITAL_COMMUNITY)
Admission: RE | Admit: 2011-08-09 | Discharge: 2011-08-09 | Disposition: A | Discharge status: Home / Self Care | Payer: Medicare PPO | Source: Ambulatory Visit | Attending: Internal Medicine | Admitting: Internal Medicine

## 2011-08-09 ENCOUNTER — Other Ambulatory Visit: Payer: Self-pay | Admitting: Internal Medicine

## 2011-08-09 DIAGNOSIS — C7951 Secondary malignant neoplasm of bone: Secondary | ICD-10-CM | POA: Insufficient documentation

## 2011-08-09 DIAGNOSIS — C3490 Malignant neoplasm of unspecified part of unspecified bronchus or lung: Secondary | ICD-10-CM

## 2011-08-09 DIAGNOSIS — Z79899 Other long term (current) drug therapy: Secondary | ICD-10-CM | POA: Insufficient documentation

## 2011-08-09 MED ORDER — IOHEXOL 300 MG/ML  SOLN
100.0000 mL | Freq: Once | INTRAMUSCULAR | Status: AC | PRN
  Administered 2011-08-09: 100 mL via INTRAVENOUS

## 2011-08-13 ENCOUNTER — Other Ambulatory Visit: Payer: Self-pay | Admitting: Internal Medicine

## 2011-08-13 DIAGNOSIS — C3490 Malignant neoplasm of unspecified part of unspecified bronchus or lung: Secondary | ICD-10-CM

## 2011-08-14 ENCOUNTER — Other Ambulatory Visit (HOSPITAL_BASED_OUTPATIENT_CLINIC_OR_DEPARTMENT_OTHER): Payer: Medicare PPO

## 2011-08-14 ENCOUNTER — Ambulatory Visit (HOSPITAL_BASED_OUTPATIENT_CLINIC_OR_DEPARTMENT_OTHER): Payer: Medicare PPO | Admitting: Internal Medicine

## 2011-08-14 ENCOUNTER — Other Ambulatory Visit: Payer: Self-pay | Admitting: Internal Medicine

## 2011-08-14 ENCOUNTER — Ambulatory Visit (HOSPITAL_BASED_OUTPATIENT_CLINIC_OR_DEPARTMENT_OTHER): Payer: Medicare PPO

## 2011-08-14 ENCOUNTER — Encounter: Payer: Self-pay | Admitting: *Deleted

## 2011-08-14 VITALS — BP 123/74 | HR 58 | Temp 98.9°F | Ht 65.0 in | Wt 146.6 lb

## 2011-08-14 DIAGNOSIS — C7951 Secondary malignant neoplasm of bone: Secondary | ICD-10-CM

## 2011-08-14 DIAGNOSIS — C3490 Malignant neoplasm of unspecified part of unspecified bronchus or lung: Secondary | ICD-10-CM

## 2011-08-14 DIAGNOSIS — Z5111 Encounter for antineoplastic chemotherapy: Secondary | ICD-10-CM

## 2011-08-14 DIAGNOSIS — C3410 Malignant neoplasm of upper lobe, unspecified bronchus or lung: Secondary | ICD-10-CM

## 2011-08-14 LAB — COMPREHENSIVE METABOLIC PANEL
ALT: 27 U/L (ref 0–35)
AST: 27 U/L (ref 0–37)
Albumin: 4.3 g/dL (ref 3.5–5.2)
Alkaline Phosphatase: 60 U/L (ref 39–117)
Potassium: 4.1 mEq/L (ref 3.5–5.3)
Sodium: 136 mEq/L (ref 135–145)
Total Protein: 6.5 g/dL (ref 6.0–8.3)

## 2011-08-14 LAB — CBC WITH DIFFERENTIAL/PLATELET
BASO%: 1.1 % (ref 0.0–2.0)
EOS%: 0.8 % (ref 0.0–7.0)
HGB: 11.9 g/dL (ref 11.6–15.9)
MCH: 33.1 pg (ref 25.1–34.0)
MCHC: 34.3 g/dL (ref 31.5–36.0)
RDW: 12.6 % (ref 11.2–14.5)
WBC: 3.6 10*3/uL — ABNORMAL LOW (ref 3.9–10.3)
lymph#: 1.3 10*3/uL (ref 0.9–3.3)
nRBC: 0 % (ref 0–0)

## 2011-08-14 MED ORDER — DEXAMETHASONE SODIUM PHOSPHATE 10 MG/ML IJ SOLN
10.0000 mg | Freq: Once | INTRAMUSCULAR | Status: AC
  Administered 2011-08-14: 10 mg via INTRAVENOUS

## 2011-08-14 MED ORDER — ONDANSETRON 8 MG/50ML IVPB (CHCC)
8.0000 mg | Freq: Once | INTRAVENOUS | Status: AC
  Administered 2011-08-14: 8 mg via INTRAVENOUS

## 2011-08-14 MED ORDER — ZOLEDRONIC ACID 4 MG/100ML IV SOLN
4.0000 mg | Freq: Once | INTRAVENOUS | Status: AC
  Administered 2011-08-14: 4 mg via INTRAVENOUS
  Filled 2011-08-14: qty 100

## 2011-08-14 MED ORDER — SODIUM CHLORIDE 0.9 % IV SOLN
500.0000 mg/m2 | Freq: Once | INTRAVENOUS | Status: AC
  Administered 2011-08-14: 875 mg via INTRAVENOUS
  Filled 2011-08-14: qty 35

## 2011-08-14 MED ORDER — SODIUM CHLORIDE 0.9 % IV SOLN
Freq: Once | INTRAVENOUS | Status: AC
  Administered 2011-08-14: 14:00:00 via INTRAVENOUS

## 2011-08-14 NOTE — Progress Notes (Signed)
OFFICE PROGRESS NOTE  Tamara Barre, MD, MD [address] [facility] [address] 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  PRIOR THERAPY:   1. Status post left upper lobectomy with mediastinal lymph node dissection under the care of Dr. Dorris Fetch on December 10, 2007.  2. Status post 4 cycles of adjuvant chemotherapy with cisplatin and Taxotere.  Last dose was given 03/22/2008.  3. Status post 6 cycles of systemic chemotherapy with carboplatin and Alimta for metastatic disease in the bone.  The last dose was given July 02, 2010, with stable disease.  4. Status post 12 cycles of maintenance Alimta at 500 mg/sq m given every 3 weeks.  The last dose was given 05/08/2011.  CURRENT THERAPY:   1. Maintenance Alimta at 500 mg/sq m given every 4 weeks.  The patient is status post 3 cycles.  2. Zometa 4 mg IV given every month for bone metastasis.   INTERVAL HISTORY: Marise Knapper 70 y.o. female returns to clinic today for followup visit. Ms. Garritano is doing fine she is tolerating her treatment with single agent Alimta fairly well. The patient denied having any significant nausea or vomiting, she denied having any chest pain or shortness of breath. She has no significant weight loss or night sweats, no bony aches. She has repeat CT scan of the head, chest, abdomen and pelvis performed on 08/09/2011. She is here today for evaluation and discussion of his scan results.  MEDICAL HISTORY: Past Medical History  Diagnosis Date  . Hypertension   . HYPERLIPIDEMIA 11/05/2007  . ANXIETY 05/13/2009  . DEPRESSIVE DISORDER 05/06/2008  . HYPERTENSION 11/05/2007  . ALLERGIC RHINITIS 11/05/2007  . CONSTIPATION, CHRONIC 05/13/2009  . UTI 05/06/2008  . BACK PAIN 05/11/2008  . OSTEOPOROSIS 11/05/2007  . INSOMNIA-SLEEP  DISORDER-UNSPEC 05/13/2009  . FATIGUE 05/13/2009  . Swelling, mass, or lump in chest 11/05/2007  . LUNG CANCER, HX OF 05/06/2008  . Metastasis dx'd /2011    to bone (spine & hip)  . Lung cancer 2009    ALLERGIES:  is allergic to amoxicillin.  MEDICATIONS:  Current Outpatient Prescriptions  Medication Sig Dispense Refill  . aspirin 81 MG EC tablet Take 81 mg by mouth daily.        . Calcium Carbonate-Vitamin D (CALCIUM-VITAMIN D) 500-200 MG-UNIT per tablet Take 1 tablet by mouth 2 (two) times daily.        . folic acid (FOLVITE) 1 MG tablet Take 1 mg by mouth daily.        [name] LORazepam (ATIVAN) 0.5 MG tablet Take 0.5 mg by mouth every 8 (eight) hours as needed.        [name] losartan-hydrochlorothiazide (HYZAAR) 50-12.5 MG per tablet Take 1 tablet by mouth daily.  90 tablet  3  . Multiple Vitamin (ANTIOXIDANT A/C/E PO) Take by mouth daily.        . Omega-3 350 MG CAPS Take by mouth daily.        . polyethylene glycol powder (MIRALAX) powder Take 17 g by mouth daily.        . simvastatin (ZOCOR) 40 MG tablet Take 1 tablet (40 mg total) by mouth every evening.  90 tablet  3  . vitamin B-12 (CYANOCOBALAMIN) 100 MCG tablet Take 50 mcg by mouth daily.        [name]  zolendronic acid (ZOMETA) 4 MG/5ML injection Inject 4 mg into the vein once.        [name] zolpidem (AMBIEN CR) 12.5 MG CR tablet Take 1 tablet (12.5 mg total) by mouth at bedtime as needed.  90 tablet  1  . Flaxseed, Linseed, (FLAXSEED OIL) 1000 MG CAPS Take 1,000 mg by mouth daily.        . vitamin E (CVS VITAMIN E) 400 UNIT capsule Take 400 Units by mouth daily.          SURGICAL HISTORY:  Past Surgical History  Procedure Date  . Tubal ligation   . S/p lul lung surgury 12/2007  . Lobectomy     REVIEW OF SYSTEMS:  A comprehensive review of systems was negative.   PHYSICAL EXAMINATION: General appearance: alert, cooperative, appears stated age and no distress Head: Normocephalic, without obvious abnormality, atraumatic Neck: no  adenopathy Lymph nodes: Cervical, supraclavicular, and axillary nodes normal. Resp: clear to auscultation bilaterally Cardio: regular rate and rhythm, S1, S2 normal, no murmur, click, rub or gallop GI: soft, non-tender; bowel sounds normal; no masses,  no organomegaly Extremities: extremities normal, atraumatic, no cyanosis or edema Neurologic: Alert and oriented X 3, normal strength and tone. Normal symmetric reflexes. Normal coordination and gait  ECOG PERFORMANCE STATUS: 1 - Symptomatic but completely ambulatory  Blood pressure 123/74, pulse 58, temperature 98.9 F (37.2 C), temperature source Oral, height 5\' 5"  (1.651 m), weight 146 lb 9.6 oz (66.497 kg).  LABORATORY DATA: Lab Results  Component Value Date   WBC 2.5* 07/07/2011   HGB 11.9 08/14/2011   HCT 34.7* 08/14/2011   MCV 96.4 08/14/2011   PLT 247 08/14/2011   WBC 3.6 on 08/14/11.    Chemistry      Component Value Date/Time   NA 140 07/26/2011 1441   K 3.7 07/26/2011 1441   CL 102 07/26/2011 1441   CO2 27 07/26/2011 1441   BUN 13 07/26/2011 1441   CREATININE 1.13* 07/26/2011 1441      Component Value Date/Time   CALCIUM 9.8 07/26/2011 1441   ALKPHOS 59 07/26/2011 1441   AST 32 07/26/2011 1441   ALT 31 07/26/2011 1441   BILITOT 0.4 07/26/2011 1441       RADIOGRAPHIC STUDIES:  CT HEAD WITHOUT AND WITH CONTRAST  IMPRESSION: 1.  Stable, normal CT appearance of the brain. 2.  Chest abdomen and pelvis reported separately.  CT CHEST  IMPRESSION: No evidence of lung cancer recurrence .  Stable postsurgical change in left upper lobe.  CT ABDOMEN AND PELVIS  IMPRESSION:  1.  No evidence metastasis within the soft tissues of the abdomen or pelvis. 2.  Stable sclerotic metastasis in the pelvis and spine.  ASSESSMENT: This is a very pleasant 70 years old white female white female with metastatic non-small cell lung cancer, adenocarcinoma. The patient is currently on maintenance treatment with single agent Alimta  given every 4 weeks. She is feeling fine and tolerating her treatment fairly well. She has no evidence for disease progression on the CT scan. I discussed the scan results with Ms. Nussbaumer.  PLAN: I recommended for her to continue her treatment with maintenance Alimta in addition to Zometa for the bone disease. She would come back for followup visit in 4 weeks with the next cycle of her chemotherapy.   All questions were answered. The patient knows to call the clinic with any problems, questions or concerns. We can certainly see the patient much sooner if necessary.

## 2011-08-14 NOTE — Progress Notes (Signed)
Advance Directive and Health Care Power of Attorney papers given medical records to be scanned [facility]

## 2011-09-01 ENCOUNTER — Encounter: Payer: Self-pay | Admitting: *Deleted

## 2011-09-01 ENCOUNTER — Other Ambulatory Visit: Payer: Self-pay | Admitting: Internal Medicine

## 2011-09-01 DIAGNOSIS — F419 Anxiety disorder, unspecified: Secondary | ICD-10-CM

## 2011-09-01 NOTE — Progress Notes (Signed)
Fill request for lorazepam received from [facility].  Request to MD for review.

## 2011-09-04 ENCOUNTER — Other Ambulatory Visit: Payer: Self-pay | Admitting: *Deleted

## 2011-09-04 DIAGNOSIS — C3490 Malignant neoplasm of unspecified part of unspecified bronchus or lung: Secondary | ICD-10-CM

## 2011-09-04 MED ORDER — LORAZEPAM 0.5 MG PO TABS: 0.5000 mg | ORAL_TABLET | Freq: Three times a day (TID) | ORAL | Status: AC | PRN

## 2011-09-11 ENCOUNTER — Telehealth: Payer: Self-pay | Admitting: Internal Medicine

## 2011-09-11 ENCOUNTER — Other Ambulatory Visit: Payer: Medicare PPO | Admitting: Lab

## 2011-09-11 ENCOUNTER — Ambulatory Visit (HOSPITAL_BASED_OUTPATIENT_CLINIC_OR_DEPARTMENT_OTHER): Payer: Medicare PPO | Admitting: Physician Assistant

## 2011-09-11 ENCOUNTER — Ambulatory Visit (HOSPITAL_BASED_OUTPATIENT_CLINIC_OR_DEPARTMENT_OTHER): Payer: Medicare PPO

## 2011-09-11 ENCOUNTER — Encounter: Payer: Self-pay | Admitting: Physician Assistant

## 2011-09-11 ENCOUNTER — Other Ambulatory Visit: Payer: Self-pay | Admitting: Internal Medicine

## 2011-09-11 ENCOUNTER — Other Ambulatory Visit (HOSPITAL_BASED_OUTPATIENT_CLINIC_OR_DEPARTMENT_OTHER): Payer: Medicare PPO | Admitting: Lab

## 2011-09-11 VITALS — BP 116/74 | HR 54 | Temp 97.3°F | Ht 65.0 in | Wt 147.9 lb

## 2011-09-11 DIAGNOSIS — C3410 Malignant neoplasm of upper lobe, unspecified bronchus or lung: Secondary | ICD-10-CM

## 2011-09-11 DIAGNOSIS — C7951 Secondary malignant neoplasm of bone: Secondary | ICD-10-CM

## 2011-09-11 DIAGNOSIS — C3490 Malignant neoplasm of unspecified part of unspecified bronchus or lung: Secondary | ICD-10-CM

## 2011-09-11 DIAGNOSIS — Z5111 Encounter for antineoplastic chemotherapy: Secondary | ICD-10-CM

## 2011-09-11 DIAGNOSIS — E785 Hyperlipidemia, unspecified: Secondary | ICD-10-CM

## 2011-09-11 LAB — CBC WITH DIFFERENTIAL/PLATELET
Basophils Absolute: 0 10*3/uL (ref 0.0–0.1)
EOS%: 2.2 % (ref 0.0–7.0)
HCT: 37.1 % (ref 34.8–46.6)
HGB: 12.5 g/dL (ref 11.6–15.9)
MCH: 33 pg (ref 25.1–34.0)
NEUT%: 46.3 % (ref 38.4–76.8)
lymph#: 1.5 10*3/uL (ref 0.9–3.3)

## 2011-09-11 LAB — COMPREHENSIVE METABOLIC PANEL
AST: 27 U/L (ref 0–37)
Alkaline Phosphatase: 52 U/L (ref 39–117)
BUN: 18 mg/dL (ref 6–23)
Calcium: 9.6 mg/dL (ref 8.4–10.5)
Chloride: 101 mEq/L (ref 96–112)
Creatinine, Ser: 0.84 mg/dL (ref 0.50–1.10)

## 2011-09-11 MED ORDER — SODIUM CHLORIDE 0.9 % IV SOLN
500.0000 mg/m2 | Freq: Once | INTRAVENOUS | Status: AC
  Administered 2011-09-11: 875 mg via INTRAVENOUS
  Filled 2011-09-11: qty 35

## 2011-09-11 MED ORDER — HEPARIN SOD (PORK) LOCK FLUSH 100 UNIT/ML IV SOLN
500.0000 [IU] | Freq: Once | INTRAVENOUS | Status: DC | PRN
  Filled 2011-09-11: qty 5

## 2011-09-11 MED ORDER — CYANOCOBALAMIN 1000 MCG/ML IJ SOLN
1000.0000 ug | Freq: Once | INTRAMUSCULAR | Status: AC
  Administered 2011-09-11: 1000 ug via INTRAMUSCULAR

## 2011-09-11 MED ORDER — SODIUM CHLORIDE 0.9 % IV SOLN: Freq: Once | INTRAVENOUS | Status: DC

## 2011-09-11 MED ORDER — ZOLEDRONIC ACID 4 MG/100ML IV SOLN
4.0000 mg | Freq: Once | INTRAVENOUS | Status: AC
  Administered 2011-09-11: 4 mg via INTRAVENOUS
  Filled 2011-09-11: qty 100

## 2011-09-11 MED ORDER — SODIUM CHLORIDE 0.9 % IJ SOLN
10.0000 mL | INTRAMUSCULAR | Status: DC | PRN
  Filled 2011-09-11: qty 10

## 2011-09-11 MED ORDER — ONDANSETRON 8 MG/50ML IVPB (CHCC)
8.0000 mg | Freq: Once | INTRAVENOUS | Status: AC
  Administered 2011-09-11: 8 mg via INTRAVENOUS

## 2011-09-11 MED ORDER — DEXAMETHASONE SODIUM PHOSPHATE 10 MG/ML IJ SOLN
10.0000 mg | Freq: Once | INTRAMUSCULAR | Status: AC
  Administered 2011-09-11: 10 mg via INTRAVENOUS

## 2011-09-11 NOTE — Telephone Encounter (Signed)
gve the pt her jan 2013 appt calendar °

## 2011-09-11 NOTE — Progress Notes (Signed)
OFFICE PROGRESS NOTE  [name], MD, MD [address] [facility] [address] 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  PRIOR THERAPY:   1. Status post left upper lobectomy with mediastinal lymph node dissection under the care of Dr. Dorris Fetch on December 10, 2007.  2. Status post 4 cycles of adjuvant chemotherapy with cisplatin and Taxotere.  Last dose was given 03/22/2008.  3. Status post 6 cycles of systemic chemotherapy with carboplatin and Alimta for metastatic disease in the bone.  The last dose was given July 02, 2010, with stable disease.  4. Status post 12 cycles of maintenance Alimta at 500 mg/sq m given every 3 weeks.  The last dose was given 05/08/2011.  CURRENT THERAPY:   1. Maintenance Alimta at 500 mg/sq m given every 4 weeks.  The patient is status post 4 cycles.  2. Zometa 4 mg IV given every month for bone metastasis.   INTERVAL HISTORY: Tamara Ortiz 70 y.o. female returns to clinic today for followup visit. Tamara Ortiz is doing fine she is tolerating her treatment with single agent Alimta fairly well. She reports that she did have her flu vaccine. She complains of trouble sleeping. She currently takes Ativan at night and this is helpful to help her sleep. She also takes it twice a day in and around her chemotherapy as his steroids makes her nervous. She does note some tingling/numbness in her fingers that tends to be worse at night. She is spending more time on the computer and this may be aggravating the symptoms. 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 This does not cause any near falls or falls.  MEDICAL HISTORY: Past Medical History  Diagnosis Date  . Hypertension   . HYPERLIPIDEMIA 11/05/2007  . ANXIETY  05/13/2009  . DEPRESSIVE DISORDER 05/06/2008  . HYPERTENSION 11/05/2007  . ALLERGIC RHINITIS 11/05/2007  . CONSTIPATION, CHRONIC 05/13/2009  . UTI 05/06/2008  . BACK PAIN 05/11/2008  . OSTEOPOROSIS 11/05/2007  . INSOMNIA-SLEEP DISORDER-UNSPEC 05/13/2009  . FATIGUE 05/13/2009  . Swelling, mass, or lump in chest 11/05/2007  . LUNG CANCER, HX OF 05/06/2008  . Metastasis dx'd /2011    to bone (spine & hip)  . Lung cancer 2009    ALLERGIES:  is allergic to amoxicillin.  MEDICATIONS:  Current Outpatient Prescriptions  Medication Sig Dispense Refill  . aspirin 81 MG EC tablet Take 81 mg by mouth daily.        . Calcium Carbonate-Vitamin D (CALCIUM-VITAMIN D) 500-200 MG-UNIT per tablet Take 1 tablet by mouth 2 (two) times daily.        . Coenzyme Q10 (COQ10) 100 MG CAPS Take 100 mg by mouth daily.        . Flaxseed, Linseed, (FLAXSEED OIL) 1000 MG CAPS Take 1,000 mg by mouth daily.        . folic acid (FOLVITE) 1 MG tablet Take 1 mg by mouth daily.        [name] LORazepam (ATIVAN) 0.5 MG tablet TAKE 1 TABLET BY MOUTH EVERY 8 HOURS AS NEEDED FOR NAUSEA AND VOMITING  40 tablet  0  . LORazepam (ATIVAN) 0.5 MG tablet Take 1 tablet (0.5 mg total) by mouth every 8 (eight) hours as needed for anxiety.  40 tablet  0  . losartan-hydrochlorothiazide (HYZAAR) 50-12.5 MG per tablet Take 1 tablet by mouth daily.  90 tablet  3  . Multiple Vitamin (ANTIOXIDANT A/C/E PO) Take by mouth daily.        . Omega-3 350 MG CAPS Take by mouth daily.        . polyethylene glycol powder (MIRALAX) powder Take 17 g by mouth daily.        . Probiotic Product (PROBIOTIC FORMULA) CAPS Take 1 capsule by mouth daily.        . Red Yeast Rice 600 MG CAPS Take 600 mg by mouth 2 (two) times daily.        . vitamin B-12 (CYANOCOBALAMIN) 100 MCG tablet Take 50 mcg by mouth daily.        . vitamin E (CVS VITAMIN E) 400 UNIT capsule Take 400 Units by mouth daily.        [name] zolendronic acid (ZOMETA) 4 MG/5ML injection Inject 4 mg into the vein once.         . simvastatin (ZOCOR) 40 MG tablet Take 1 tablet (40 mg total) by mouth every evening.  90 tablet  3  . zolpidem (AMBIEN CR) 12.5 MG CR tablet Take 1 tablet (12.5 mg total) by mouth at bedtime as needed.  90 tablet  1   No current facility-administered medications for this visit.   Facility-Administered Medications Ordered in Other Visits  Medication Dose Route Frequency Provider Last Rate Last Dose  . cyanocobalamin ((VITAMIN B-12)) injection 1,000 mcg  1,000 mcg Intramuscular Once Carloyn Jaeger, PHARMD   1,000 mcg at 09/11/11 1222  . dexamethasone (DECADRON) injection 10 mg  10 mg Intravenous Once Mohamed K. Mohamed, MD   10 mg at 09/11/11 1154  . ondansetron (ZOFRAN) IVPB 8 mg  8 mg Intravenous Once Mohamed K. Mohamed, MD   8 mg at 09/11/11 1154  . PEMEtrexed (ALIMTA) 875 mg in sodium chloride 0.9 % 100 mL chemo infusion  500 mg/m2 (Treatment Plan Actual) Intravenous Once Mohamed K. Mohamed, MD   875 mg at 09/11/11 1246  . Zoledronic Acid (ZOMETA) 4 mg IVPB  4 mg Intravenous Once Mohamed K. Mohamed, MD   4 mg at 09/11/11 1225  . DISCONTD: 0.9 %  sodium chloride infusion   Intravenous Once Mohamed K. Mohamed, MD      . DISCONTD: 0.9 %  sodium chloride infusion   Intravenous Once Mohamed K. Mohamed, MD      . DISCONTD: heparin lock flush 100 unit/mL  500 Units Intracatheter Once PRN Mohamed K. Mohamed, MD      . DISCONTD: heparin lock flush 100 unit/mL  500 Units Intracatheter Once PRN Mohamed K. Mohamed, MD      . DISCONTD: sodium chloride 0.9 % injection 10 mL  10 mL Intracatheter PRN Mohamed K. Mohamed, MD      . DISCONTD: sodium chloride 0.9 % injection 10 mL  10 mL Intracatheter PRN Mohamed K. Arbutus Ped, MD        SURGICAL HISTORY:  Past Surgical History  Procedure Date  . Tubal ligation   . S/p lul lung surgury 12/2007  . Lobectomy     REVIEW OF SYSTEMS:  A comprehensive review of systems was negative except for: Constitutional: positive for Difficulty sleeping Neurological:  positive for paresthesia   PHYSICAL EXAMINATION: General appearance: alert, cooperative, appears stated age and no distress Head: Normocephalic, without obvious abnormality, atraumatic Neck: no adenopathy Lymph nodes: Cervical, supraclavicular, and axillary nodes normal. Resp: clear  to auscultation bilaterally Cardio: regular rate and rhythm, S1, S2 normal, no murmur, click, rub or gallop GI: soft, non-tender; bowel sounds normal; no masses,  no organomegaly Extremities: extremities normal, atraumatic, no cyanosis or edema Neurologic: Alert and oriented X 3, normal strength and tone. Normal symmetric reflexes. Normal coordination and gait  ECOG PERFORMANCE STATUS: 1 - Symptomatic but completely ambulatory  Blood pressure 116/74, pulse 54, temperature 97.3 F (36.3 C), temperature source Oral, height 5\' 5"  (1.651 m), weight 147 lb 14.4 oz (67.087 kg).  LABORATORY DATA: Lab Results  Component Value Date   WBC 4.0 09/11/2011   HGB 12.5 09/11/2011   HCT 37.1 09/11/2011   MCV 97.9 09/11/2011   PLT 212 09/11/2011   WBC 3.6 on 08/14/11.    Chemistry      Component Value Date/Time   NA 140 09/11/2011 0948   K 3.9 09/11/2011 0948   CL 101 09/11/2011 0948   CO2 27 09/11/2011 0948   BUN 18 09/11/2011 0948   CREATININE 0.84 09/11/2011 0948      Component Value Date/Time   CALCIUM 9.6 09/11/2011 0948   ALKPHOS 52 09/11/2011 0948   AST 27 09/11/2011 0948   ALT 23 09/11/2011 0948   BILITOT 0.4 09/11/2011 0948       RADIOGRAPHIC STUDIES:  CT HEAD WITHOUT AND WITH CONTRAST  IMPRESSION: 1.  Stable, normal CT appearance of the brain. 2.  Chest abdomen and pelvis reported separately.  CT CHEST  IMPRESSION: No evidence of lung cancer recurrence .  Stable postsurgical change in left upper lobe.  CT ABDOMEN AND PELVIS  IMPRESSION:  1.  No evidence metastasis within the soft tissues of the abdomen or pelvis. 2.  Stable sclerotic metastasis in the pelvis and  spine.  ASSESSMENT/PLAN: This is a very pleasant 70 years old white female with metastatic non-small cell lung cancer, adenocarcinoma. The patient is currently on maintenance treatment with single agent Alimta given every 4 weeks. Overall she's tolerating her therapy without difficulty. Patient was discussed with Dr. Arbutus Ped and she will proceed with her scheduled cycle of maintenance Alimta and her Zometa as scheduled and return in 4 weeks with repeat CBC differential and C. met prior to her next scheduled cycle of maintenance chemotherapy with Alimta in her next monthly infusion of Zometa.    All questions were answered. The patient knows to call the clinic with any problems, questions or concerns. We can certainly see the patient much sooner if necessary.

## 2011-09-13 ENCOUNTER — Encounter: Payer: Self-pay | Admitting: Endocrinology

## 2011-09-13 ENCOUNTER — Ambulatory Visit (INDEPENDENT_AMBULATORY_CARE_PROVIDER_SITE_OTHER): Payer: Medicare PPO | Admitting: Endocrinology

## 2011-09-13 VITALS — BP 124/80 | HR 81 | Temp 99.9°F | Ht 65.0 in | Wt 150.4 lb

## 2011-09-13 DIAGNOSIS — J209 Acute bronchitis, unspecified: Secondary | ICD-10-CM

## 2011-09-13 MED ORDER — OSELTAMIVIR PHOSPHATE 75 MG PO CAPS: 75.0000 mg | ORAL_CAPSULE | Freq: Two times a day (BID) | ORAL | Status: AC

## 2011-09-13 MED ORDER — AZITHROMYCIN 500 MG PO TABS: 500.0000 mg | ORAL_TABLET | Freq: Every day | ORAL | Status: AC

## 2011-09-13 MED ORDER — PROMETHAZINE-CODEINE 6.25-10 MG/5ML PO SYRP: 5.0000 mL | ORAL_SOLUTION | ORAL | Status: AC | PRN

## 2011-09-13 NOTE — Patient Instructions (Signed)
Here are 3 prescriptions: cough syrup, antibiotic, and "flu-pill." Loratadine-d (non-prescription) will help your congestion. I hope you feel better soon.  If you don't feel better by next week, please call your doctor.  If you get worse, go to the ER.

## 2011-09-13 NOTE — Progress Notes (Signed)
Subjective:    Patient ID: Tamara Ortiz, female    DOB: [birth year], 70 y.o.   MRN: [id number]  HPI 1 day of dry-quality cough in the chest, and assoc nasal congestion.  No earache.   Past Medical History  Diagnosis Date  . Hypertension   . HYPERLIPIDEMIA 11/05/2007  . ANXIETY 05/13/2009  . DEPRESSIVE DISORDER 05/06/2008  . HYPERTENSION 11/05/2007  . ALLERGIC RHINITIS 11/05/2007  . CONSTIPATION, CHRONIC 05/13/2009  . UTI 05/06/2008  . BACK PAIN 05/11/2008  . OSTEOPOROSIS 11/05/2007  . INSOMNIA-SLEEP DISORDER-UNSPEC 05/13/2009  . FATIGUE 05/13/2009  . Swelling, mass, or lump in chest 11/05/2007  . LUNG CANCER, HX OF 05/06/2008  . Metastasis dx'd /2011    to bone (spine & hip)  . Lung cancer 2009    Past Surgical History  Procedure Date  . Tubal ligation   . S/p lul lung surgury 12/2007  . Lobectomy     History   Social History  . Marital Status: Divorced    Spouse Name: N/A    Number of Children: N/A  . Years of Education: N/A   Occupational History  . 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 Smoking status: Never Smoker   . Smokeless tobacco: Not on file  . Alcohol Use: Yes  . Drug Use: No  . Sexually Active: Not on file   Other Topics Concern  . Not on file   Social History Narrative  . No narrative on file    Current Outpatient Prescriptions on File Prior to Visit  Medication Sig Dispense Refill  . aspirin 81 MG EC tablet Take 81 mg by mouth daily.        . Calcium Carbonate-Vitamin D (CALCIUM-VITAMIN D) 500-200 MG-UNIT per tablet Take 1 tablet by mouth 2 (two) times daily.        . Coenzyme Q10 (COQ10) 100 MG CAPS Take 100 mg by mouth daily.        . Flaxseed, Linseed, (FLAXSEED OIL) 1000 MG CAPS Take 1,000 mg by mouth daily.        . folic acid (FOLVITE) 1 MG tablet Take 1 mg by mouth daily.        [name] LORazepam (ATIVAN) 0.5 MG tablet Take 1 tablet (0.5 mg total) by mouth every 8 (eight)  hours as needed for anxiety.  40 tablet  0  . losartan-hydrochlorothiazide (HYZAAR) 50-12.5 MG per tablet Take 1 tablet by mouth daily.  90 tablet  3  . Multiple Vitamin (ANTIOXIDANT A/C/E PO) Take by mouth daily.        . Omega-3 350 MG CAPS Take by mouth daily.        . polyethylene glycol powder (MIRALAX) powder Take 17 g by mouth daily.        . Probiotic Product (PROBIOTIC FORMULA) CAPS Take 1 capsule by mouth daily.        . Red Yeast Rice 600 MG CAPS Take 600 mg by mouth 2 (two) times daily.        . simvastatin (ZOCOR) 40 MG tablet Take 1 tablet (40 mg total) by mouth every evening.  90 tablet  3  . vitamin B-12 (CYANOCOBALAMIN) 100 MCG tablet Take 50 mcg by mouth daily.        . vitamin E (CVS VITAMIN E) 400 UNIT capsule Take 400 Units by mouth daily.        [name]  zolendronic acid (ZOMETA) 4 MG/5ML injection Inject 4 mg into the vein once.          Allergies  Allergen Reactions  . Amoxicillin     REACTION: hives/itch    Family History  Problem Relation Age of Onset  . Heart disease Mother   . ALS Father   . Alcohol abuse Son     ETOH    BP 124/80  Pulse 81  Temp(Src) 99.9 F (37.7 C) (Oral)  Ht 5\' 5"  (1.651 m)  Wt 150 lb 6.4 oz (68.221 kg)  BMI 25.03 kg/m2  SpO2 99%  Review of Systems She has fever, and nausea.  No vomiting.    Objective:   Physical Exam VITAL SIGNS:  See vs page GENERAL: no distress head: no deformity eyes: no periorbital swelling, no proptosis external nose and ears are normal mouth: no lesion seen Both eac's and tm's are normal LUNGS:  Clear to auscultation   Lab Results  Component Value Date   WBC 4.0 09/11/2011   HGB 12.5 09/11/2011   HCT 37.1 09/11/2011   MCV 97.9 09/11/2011   PLT 212 09/11/2011      Assessment & Plan:  Acute bronchitis, new Adenocarcinoma of the lung.  She had recent chemotx, but cbc is good Anxiety.  There is risk of an interaction between ativan and cough syrup, but the benefit of symptomatic relief in a  cancer patient is worth it.

## 2011-09-14 ENCOUNTER — Telehealth: Payer: Self-pay | Admitting: Internal Medicine

## 2011-09-14 NOTE — Telephone Encounter (Signed)
Received alimta on Monday 12/10  and stated  she felt bad yesterday with stuffy nose, temp 100.2 with cough . Saw Dr. Jonny Ruiz and he said she had the flu ( he did not do a flu test ). HE prescribed  tamiflu and e-mycin and cough syrup. I instructed her to take prescriptions , drink  extra fluids ,rest and to notify us for temp >100.5. She voices understanding

## 2011-09-18 ENCOUNTER — Telehealth: Payer: Self-pay

## 2011-09-18 ENCOUNTER — Telehealth: Payer: Self-pay | Admitting: *Deleted

## 2011-09-18 NOTE — Telephone Encounter (Signed)
Ok for 1 bottle mag citrate OTC (or glycerin supp 1 pr x 1)

## 2011-09-18 NOTE — Telephone Encounter (Signed)
Pt called stating she had the flu last week and had diarrhea this past Friday, and is now battling with constipation.  She has taken miralax and milk of magnesium and feels full and achy.  Per AJ, pt needs to contact PCP regarding these symptoms.  She verbalized understanding.  SLJ

## 2011-09-18 NOTE — Telephone Encounter (Signed)
Pt called stating she had some diarrhea associated with the flu, however since Friday 12/14 she has not had a BM. Pt took miralax yesterday with no result and MOM today at 1p with no results yet. Pt has noticed mild abdominal discomfort and fullness, please advise,

## 2011-09-19 NOTE — Telephone Encounter (Signed)
Patient informed. 

## 2011-09-27 ENCOUNTER — Other Ambulatory Visit: Payer: Self-pay | Admitting: Internal Medicine

## 2011-09-27 DIAGNOSIS — Z1231 Encounter for screening mammogram for malignant neoplasm of breast: Secondary | ICD-10-CM

## 2011-09-29 ENCOUNTER — Other Ambulatory Visit: Payer: Self-pay | Admitting: Internal Medicine

## 2011-09-29 DIAGNOSIS — C3490 Malignant neoplasm of unspecified part of unspecified bronchus or lung: Secondary | ICD-10-CM

## 2011-10-08 ENCOUNTER — Other Ambulatory Visit: Payer: Self-pay | Admitting: Internal Medicine

## 2011-10-09 ENCOUNTER — Telehealth: Payer: Self-pay | Admitting: Internal Medicine

## 2011-10-09 ENCOUNTER — Encounter: Payer: Self-pay | Admitting: Physician Assistant

## 2011-10-09 ENCOUNTER — Other Ambulatory Visit (HOSPITAL_BASED_OUTPATIENT_CLINIC_OR_DEPARTMENT_OTHER): Payer: Medicare Other | Admitting: Lab

## 2011-10-09 ENCOUNTER — Ambulatory Visit (HOSPITAL_BASED_OUTPATIENT_CLINIC_OR_DEPARTMENT_OTHER): Payer: Medicare Other

## 2011-10-09 ENCOUNTER — Ambulatory Visit (HOSPITAL_BASED_OUTPATIENT_CLINIC_OR_DEPARTMENT_OTHER): Payer: Medicare Other | Admitting: Physician Assistant

## 2011-10-09 VITALS — BP 109/69 | HR 67 | Temp 98.2°F | Ht 65.0 in | Wt 145.8 lb

## 2011-10-09 DIAGNOSIS — C7952 Secondary malignant neoplasm of bone marrow: Secondary | ICD-10-CM

## 2011-10-09 DIAGNOSIS — C3410 Malignant neoplasm of upper lobe, unspecified bronchus or lung: Secondary | ICD-10-CM

## 2011-10-09 DIAGNOSIS — Z5111 Encounter for antineoplastic chemotherapy: Secondary | ICD-10-CM

## 2011-10-09 DIAGNOSIS — C3490 Malignant neoplasm of unspecified part of unspecified bronchus or lung: Secondary | ICD-10-CM

## 2011-10-09 DIAGNOSIS — K5900 Constipation, unspecified: Secondary | ICD-10-CM

## 2011-10-09 DIAGNOSIS — C7951 Secondary malignant neoplasm of bone: Secondary | ICD-10-CM

## 2011-10-09 DIAGNOSIS — M810 Age-related osteoporosis without current pathological fracture: Secondary | ICD-10-CM

## 2011-10-09 LAB — CBC WITH DIFFERENTIAL/PLATELET
Basophils Absolute: 0 10*3/uL (ref 0.0–0.1)
HCT: 36.2 % (ref 34.8–46.6)
HGB: 12.3 g/dL (ref 11.6–15.9)
MONO#: 0.3 10*3/uL (ref 0.1–0.9)
NEUT#: 2.1 10*3/uL (ref 1.5–6.5)
NEUT%: 53.4 % (ref 38.4–76.8)
WBC: 3.9 10*3/uL (ref 3.9–10.3)
lymph#: 1.4 10*3/uL (ref 0.9–3.3)

## 2011-10-09 LAB — COMPREHENSIVE METABOLIC PANEL
AST: 27 U/L (ref 0–37)
Albumin: 4.6 g/dL (ref 3.5–5.2)
BUN: 15 mg/dL (ref 6–23)
Calcium: 10.7 mg/dL — ABNORMAL HIGH (ref 8.4–10.5)
Chloride: 100 mEq/L (ref 96–112)
Glucose, Bld: 95 mg/dL (ref 70–99)
Potassium: 4.1 mEq/L (ref 3.5–5.3)
Total Protein: 7 g/dL (ref 6.0–8.3)

## 2011-10-09 MED ORDER — ONDANSETRON 8 MG/50ML IVPB (CHCC)
8.0000 mg | Freq: Once | INTRAVENOUS | Status: AC
  Administered 2011-10-09: 8 mg via INTRAVENOUS

## 2011-10-09 MED ORDER — SODIUM CHLORIDE 0.9 % IV SOLN
500.0000 mg/m2 | Freq: Once | INTRAVENOUS | Status: AC
  Administered 2011-10-09: 875 mg via INTRAVENOUS
  Filled 2011-10-09: qty 35

## 2011-10-09 MED ORDER — SODIUM CHLORIDE 0.9 % IJ SOLN
10.0000 mL | INTRAMUSCULAR | Status: DC | PRN
  Filled 2011-10-09: qty 10

## 2011-10-09 MED ORDER — SODIUM CHLORIDE 0.9 % IV SOLN
Freq: Once | INTRAVENOUS | Status: AC
  Administered 2011-10-09: 13:00:00 via INTRAVENOUS

## 2011-10-09 MED ORDER — HEPARIN SOD (PORK) LOCK FLUSH 100 UNIT/ML IV SOLN
500.0000 [IU] | Freq: Once | INTRAVENOUS | Status: DC | PRN
  Filled 2011-10-09: qty 5

## 2011-10-09 MED ORDER — ZOLEDRONIC ACID 4 MG/100ML IV SOLN
4.0000 mg | Freq: Once | INTRAVENOUS | Status: AC
  Administered 2011-10-09: 4 mg via INTRAVENOUS
  Filled 2011-10-09: qty 100

## 2011-10-09 MED ORDER — DEXAMETHASONE SODIUM PHOSPHATE 10 MG/ML IJ SOLN
10.0000 mg | Freq: Once | INTRAMUSCULAR | Status: AC
  Administered 2011-10-09: 10 mg via INTRAVENOUS

## 2011-10-09 NOTE — Patient Instructions (Signed)
Patient aware of next appointment; discharged home with no complaints of pain.

## 2011-10-09 NOTE — Telephone Encounter (Signed)
gve the pt her feb 2013 appt calendar along with the ct scan appt with instructions.

## 2011-10-10 ENCOUNTER — Encounter: Payer: Self-pay | Admitting: *Deleted

## 2011-10-10 NOTE — Progress Notes (Signed)
Spoke with pt on phone who called to ask if she could again participate in the survivorship program [facility]).  Pt indicated she got a lot from the program and from being with other cancer survivors.  I told her I would add her to the list.  We also discussed having a support group for individuals with metastatic disease, which I will organize.  TMP

## 2011-10-11 NOTE — Progress Notes (Signed)
OFFICE PROGRESS NOTE  [name], MD, MD [address] 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  PRIOR THERAPY:   1. Status post left upper lobectomy with mediastinal lymph node dissection under the care of Dr. Dorris Fetch on December 10, 2007.  2. Status post 4 cycles of adjuvant chemotherapy with cisplatin and Taxotere.  Last dose was given 03/22/2008.  3. Status post 6 cycles of systemic chemotherapy with carboplatin and Alimta for metastatic disease in the bone.  The last dose was given July 02, 2010, with stable disease.  4. Status post 12 cycles of maintenance Alimta at 500 mg/sq m given every 3 weeks.  The last dose was given 05/08/2011.  CURRENT THERAPY:   1. Maintenance Alimta at 500 mg/sq m given every 4 weeks.  The patient is status post 5 cycles.  2. Zometa 4 mg IV given every month for bone metastasis.   INTERVAL HISTORY: Tehani Mersman 71 y.o. female returns to clinic today for followup visit. Ms. Frommelt is doing fine she is tolerating her treatment with single agent Alimta fairly well. She states that she had the "flu" shortly after her last cycle of chemotherapy. She has a residual cough that is nonproductive and not associated with any fever. She's had some constipation that is controlled with MiraLAX but she is only taking this episodically. She reports that her bowel regularity he is somewhat of roller coaster with some good days and some where she is more constipated. She plans to start dancing again and is still taking part in aerobics classes. She does not require any prescription refills today.    MEDICAL HISTORY: Past Medical History  Diagnosis Date  . Hypertension   . HYPERLIPIDEMIA 11/05/2007  . ANXIETY 05/13/2009  . DEPRESSIVE DISORDER  05/06/2008  . HYPERTENSION 11/05/2007  . ALLERGIC RHINITIS 11/05/2007  . CONSTIPATION, CHRONIC 05/13/2009  . UTI 05/06/2008  . BACK PAIN 05/11/2008  . OSTEOPOROSIS 11/05/2007  . INSOMNIA-SLEEP DISORDER-UNSPEC 05/13/2009  . FATIGUE 05/13/2009  . Swelling, mass, or lump in chest 11/05/2007  . LUNG CANCER, HX OF 05/06/2008  . Metastasis dx'd /2011    to bone (spine & hip)  . Lung cancer 2009    ALLERGIES:  is allergic to amoxicillin.  MEDICATIONS:  Current Outpatient Prescriptions  Medication Sig Dispense Refill  . aspirin 81 MG EC tablet Take 81 mg by mouth daily.        . Calcium Carbonate-Vitamin D (CALCIUM-VITAMIN D) 500-200 MG-UNIT per tablet Take 1 tablet by mouth 2 (two) times daily.        . Coenzyme Q10 (COQ10) 100 MG CAPS Take 100 mg by mouth daily.        . Flaxseed, Linseed, (FLAXSEED OIL) 1000 MG CAPS Take 1,000 mg by mouth daily.        . folic acid (FOLVITE) 1 MG tablet TAKE 1 TABLET BY MOUTH DAILY  90 tablet  0  . LORazepam (ATIVAN) 0.5 MG tablet TAKE 1 TABLET BY MOUTH EVERY 8 HOURS AS NEEDED FOR NAUSEA AND VOMITING  40 tablet  0  . losartan-hydrochlorothiazide (HYZAAR) 50-12.5 MG per tablet Take 1 tablet by mouth daily.  90 tablet  3  . Multiple Vitamin (ANTIOXIDANT A/C/E PO) Take by mouth daily.        [name]  Omega-3 350 MG CAPS Take by mouth daily.        . polyethylene glycol powder (MIRALAX) powder Take 17 g by mouth daily.        . Probiotic Product (PROBIOTIC FORMULA) CAPS Take 1 capsule by mouth daily.        . Red Yeast Rice 600 MG CAPS Take 600 mg by mouth 2 (two) times daily.        . simvastatin (ZOCOR) 40 MG tablet Take 1 tablet (40 mg total) by mouth every evening.  90 tablet  3  . vitamin B-12 (CYANOCOBALAMIN) 100 MCG tablet Take 50 mcg by mouth daily.        . vitamin E (CVS VITAMIN E) 400 UNIT capsule Take 400 Units by mouth daily.        [name] zolendronic acid (ZOMETA) 4 MG/5ML injection Inject 4 mg into the vein once.          SURGICAL HISTORY:  Past Surgical History    Procedure Date  . Tubal ligation   . S/p lul lung surgury 12/2007  . Lobectomy     REVIEW OF SYSTEMS:  A comprehensive review of systems was negative except for: Respiratory: positive for cough Gastrointestinal: positive for constipation Neurological: positive for paresthesia   PHYSICAL EXAMINATION: General appearance: alert, cooperative, appears stated age and no distress Head: Normocephalic, without obvious abnormality, atraumatic Neck: no adenopathy Lymph nodes: Cervical, supraclavicular, and axillary nodes normal. Resp: clear to auscultation bilaterally Cardio: regular rate and rhythm, S1, S2 normal, no murmur, click, rub or gallop GI: soft, non-tender; bowel sounds normal; no masses,  no organomegaly Extremities: extremities normal, atraumatic, no cyanosis or edema Neurologic: Alert and oriented X 3, normal strength and tone. Normal symmetric reflexes. Normal coordination and gait  ECOG PERFORMANCE STATUS: 1 - Symptomatic but completely ambulatory  Blood pressure 109/69, pulse 67, temperature 98.2 F (36.8 C), temperature source Oral, height 5\' 5"  (1.651 m), weight 145 lb 12.8 oz (66.134 kg).  LABORATORY DATA: Lab Results  Component Value Date   WBC 3.9 10/09/2011   HGB 12.3 10/09/2011   HCT 36.2 10/09/2011   MCV 96.3 10/09/2011   PLT 254 10/09/2011   WBC 3.6 on 08/14/11.    Chemistry      Component Value Date/Time   NA 139 10/09/2011 1110   K 4.1 10/09/2011 1110   CL 100 10/09/2011 1110   CO2 31 10/09/2011 1110   BUN 15 10/09/2011 1110   CREATININE 1.06 10/09/2011 1110      Component Value Date/Time   CALCIUM 10.7* 10/09/2011 1110   ALKPHOS 64 10/09/2011 1110   AST 27 10/09/2011 1110   ALT 23 10/09/2011 1110   BILITOT 0.4 10/09/2011 1110       RADIOGRAPHIC STUDIES:  CT HEAD WITHOUT AND WITH CONTRAST  IMPRESSION: 1.  Stable, normal CT appearance of the brain. 2.  Chest abdomen and pelvis reported separately.  CT CHEST  IMPRESSION: No evidence of lung cancer recurrence  .  Stable postsurgical change in left upper lobe.  CT ABDOMEN AND PELVIS  IMPRESSION:  1.  No evidence metastasis within the soft tissues of the abdomen or pelvis. 2.  Stable sclerotic metastasis in the pelvis and spine.  ASSESSMENT/PLAN: This is a very pleasant 71 years old white female with metastatic non-small cell lung cancer, adenocarcinoma. The patient is currently on maintenance treatment with single agent Alimta given every 4 weeks. Overall she's tolerating her therapy without difficulty. Patient was discussed with Dr. Arbutus Ped  and she will proceed with her scheduled cycle of maintenance Alimta and her Zometa as scheduled. She will follow with Dr. Arbutus Ped in 4 weeks with repeat CBC differential and C. met as well as a CT of the chest abdomen and pelvis with contrast to reevaluate her disease prior to her next scheduled cycle of maintenance chemotherapy with Alimta and her next monthly infusion of Zometa. For her constipation/bowel irregularity, we have recommended that she try taking a half capful of MiraLAX daily to see if this will help even things out for her. She may increase this to a capful of MiraLAX daily if needed.  Conni Slipper, PA-C   All questions were answered. The patient knows to call the clinic with any problems, questions or concerns. We can certainly see the patient much sooner if necessary.

## 2011-10-16 ENCOUNTER — Encounter: Payer: Medicare PPO | Admitting: Gynecology

## 2011-10-17 ENCOUNTER — Ambulatory Visit (INDEPENDENT_AMBULATORY_CARE_PROVIDER_SITE_OTHER): Payer: Medicare Other | Admitting: Gynecology

## 2011-10-17 ENCOUNTER — Encounter: Payer: Self-pay | Admitting: Gynecology

## 2011-10-17 VITALS — BP 120/74 | Ht 65.5 in | Wt 146.0 lb

## 2011-10-17 DIAGNOSIS — N952 Postmenopausal atrophic vaginitis: Secondary | ICD-10-CM

## 2011-10-17 DIAGNOSIS — C3490 Malignant neoplasm of unspecified part of unspecified bronchus or lung: Secondary | ICD-10-CM

## 2011-10-17 DIAGNOSIS — M810 Age-related osteoporosis without current pathological fracture: Secondary | ICD-10-CM

## 2011-10-17 NOTE — Patient Instructions (Signed)
Follow-up in one year.

## 2011-10-17 NOTE — Progress Notes (Signed)
Tamara Ortiz 10-07-1940 [id number]        71 y.o.  Follow up.  Past medical history,surgical history, medications, allergies, family history and social history were all reviewed and documented in the EPIC chart. ROS:  Was performed and pertinent positives and negatives are included in the history.  Exam: chaperone present Filed Vitals:   10/17/11 1355  BP: 120/74   General appearance  Normal Skin grossly normal Head/Neck normal with no cervical or supraclavicular adenopathy thyroid normal Lungs  clear Cardiac RR, without RMG Abdominal  soft, nontender, without masses, organomegaly or hernia Breasts  examined lying and sitting without masses, retractions, discharge or axillary adenopathy. Pelvic  Ext/BUS/vagina  With atrophic genital changes  Cervix  Atrophic  Uterus  anteverted, normal size, shape and contour, midline and mobile nontender   Adnexa  Without masses or tenderness    Anus and perineum  normal   Rectovaginal  normal sphincter tone without palpated masses or tenderness.    Assessment/Plan:  71 y.o. female for annual exam.    1. Atrophic genital changes. Patient is not symptomatic and we'll plan expectant management with reexam in one year.  She did ask me about screening for ovarian cancer and vaginal cancer. She has no family history of ovarian cancer or any genital cancers. I reviewed with her the false-positive false-negative issues with variants screening alternatives and my recommendation that we do not screen her currently and she agrees with this. 2. Metastatic lung cancer. Patient is in remission actively being followed by oncology. 3. Osteoporosis. Patients on Zometa and being followed by her oncologist for this. 4. Mammography. Patient has mammogram scheduled this week and will follow for this. SBE monthly reviewed. 5. Pap smear. Patient has no history of abnormal Pap smears with several normal reports in her chart, the last one in 2011. No Pap smear was done  today. I discussed current screening guidelines and the options to stop altogether as she is over 65 versus less frequent screening reviewed and we'll readdress this annually. 6. Colonoscopy. Patient is due for colonoscopy this year historically and plans to schedule this. 7. Health maintenance. No blood work was done today was all done through her other physician's offices. Assuming she continues well from a gynecologic standpoint and she will see me in one year, sooner as needed.    Dara Lords MD, 2:25 PM 10/17/2011

## 2011-10-18 ENCOUNTER — Ambulatory Visit (INDEPENDENT_AMBULATORY_CARE_PROVIDER_SITE_OTHER): Payer: Medicare Other | Admitting: Endocrinology

## 2011-10-18 ENCOUNTER — Telehealth: Payer: Self-pay | Admitting: *Deleted

## 2011-10-18 ENCOUNTER — Encounter: Payer: Self-pay | Admitting: Endocrinology

## 2011-10-18 VITALS — BP 124/70 | HR 64 | Temp 98.7°F | Ht 65.0 in | Wt 147.8 lb

## 2011-10-18 DIAGNOSIS — L97909 Non-pressure chronic ulcer of unspecified part of unspecified lower leg with unspecified severity: Secondary | ICD-10-CM

## 2011-10-18 DIAGNOSIS — S300XXA Contusion of lower back and pelvis, initial encounter: Secondary | ICD-10-CM

## 2011-10-18 MED ORDER — HYDROCODONE-ACETAMINOPHEN 10-325 MG PO TABS: 1.0000 | ORAL_TABLET | Freq: Four times a day (QID) | ORAL | Status: AC | PRN

## 2011-10-18 NOTE — Progress Notes (Signed)
Subjective:    Patient ID: Tamara Ortiz, female    DOB: Dec 14, 1940, 72 y.o.   MRN: [id number]  HPI Pt states 2 days of slight ulcer at the left leg, but no assoc pain.  She thinks she may have been stung by an insect.   Yesterday, pt was standing on a chair to reach something.  She struck her forehead on a cabinet door, and fell.  She struck her lower back on the floor.  It is painful since then.  She has slight nausea.  No loss of bowel or bladder control.  Denies numbness of the legs.   Past Medical History  Diagnosis Date  . Hypertension   . HYPERLIPIDEMIA 11/05/2007  . ANXIETY 05/13/2009  . DEPRESSIVE DISORDER 05/06/2008  . HYPERTENSION 11/05/2007  . ALLERGIC RHINITIS 11/05/2007  . CONSTIPATION, CHRONIC 05/13/2009  . UTI 05/06/2008  . BACK PAIN 05/11/2008  . OSTEOPOROSIS 11/05/2007  . INSOMNIA-SLEEP DISORDER-UNSPEC 05/13/2009  . FATIGUE 05/13/2009  . Swelling, mass, or lump in chest 11/05/2007  . LUNG CANCER, HX OF 05/06/2008    Past Surgical History  Procedure Date  . Tubal ligation   . S/p lul lung surgury 12/2007  . Lobectomy     History   Social History  . Marital Status: Divorced    Spouse Name: N/A    Number of Children: N/A  . Years of Education: N/A   Occupational History  . 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 Smoking status: Never Smoker   . Smokeless tobacco: Never Used  . Alcohol Use: Yes  . Drug Use: No  . Sexually Active: No   Other Topics Concern  . Not on file   Social History Narrative  . No narrative on file    Current Outpatient Prescriptions on File Prior to Visit  Medication Sig Dispense Refill  . aspirin 81 MG EC tablet Take 81 mg by mouth daily.        . Calcium Carbonate-Vitamin D (CALCIUM-VITAMIN D) 500-200 MG-UNIT per tablet Take 1 tablet by mouth 2 (two) times daily.        . Coenzyme Q10 (COQ10) 100 MG CAPS Take 100 mg by mouth daily.        . Flaxseed,  Linseed, (FLAXSEED OIL) 1000 MG CAPS Take 1,000 mg by mouth daily.        . folic acid (FOLVITE) 1 MG tablet TAKE 1 TABLET BY MOUTH DAILY  90 tablet  0  . LORazepam (ATIVAN) 0.5 MG tablet TAKE 1 TABLET BY MOUTH EVERY 8 HOURS AS NEEDED FOR NAUSEA AND VOMITING  40 tablet  0  . losartan-hydrochlorothiazide (HYZAAR) 50-12.5 MG per tablet Take 1 tablet by mouth daily.  90 tablet  3  . Multiple Vitamin (ANTIOXIDANT A/C/E PO) Take by mouth daily.        . Omega-3 350 MG CAPS Take by mouth daily.        . polyethylene glycol powder (MIRALAX) powder Take 17 g by mouth daily.        . Probiotic Product (PROBIOTIC FORMULA) CAPS Take 1 capsule by mouth daily.        . Red Yeast Rice 600 MG CAPS Take 600 mg by mouth 2 (two) times daily.        . simvastatin (ZOCOR) 40 MG tablet Take 1 tablet (40 mg total) by mouth every evening.  90 tablet  3  .  vitamin B-12 (CYANOCOBALAMIN) 100 MCG tablet Take 50 mcg by mouth daily.        . vitamin E ([employer] VITAMIN E) 400 UNIT capsule Take 400 Units by mouth daily.        [name] zolendronic acid (ZOMETA) 4 MG/5ML injection Inject 4 mg into the vein once.         Allergies  Allergen Reactions  . Amoxicillin     REACTION: hives/itch    Family History  Problem Relation Age of Onset  . Heart disease Mother   . ALS Father   . Alcohol abuse Son     ETOH   BP 124/70  Pulse 64  Temp(Src) 98.7 F (37.1 C) (Oral)  Ht 5\' 5"  (1.651 m)  Wt 147 lb 12.8 oz (67.042 kg)  BMI 24.60 kg/m2  SpO2 97%  Review of Systems Denies fever and loc.      Objective:   Physical Exam VITAL SIGNS:  See vs page GENERAL: no distress Left ant tibial area: 1 cm shallow ulcer, with minimal surrounding rim of erythema.   Spine: no lumbar tenderness, but the sacrum is slightly tender. Gait: normal and steady.       Assessment & Plan:  Leg ulcer, new Sacral contusion, new

## 2011-10-18 NOTE — Telephone Encounter (Signed)
Pt had fall last night and c/o pain in tailbone [has Hx of cancer metastatic to bone]. OV scheduled today at 11:15am w/ Dr Everardo All.

## 2011-10-18 NOTE — Patient Instructions (Addendum)
Keep the area on your left leg covered, with antibiotic ointment, and a bandaid.   Here is a prescription for pain medication.

## 2011-10-22 DIAGNOSIS — L97909 Non-pressure chronic ulcer of unspecified part of unspecified lower leg with unspecified severity: Secondary | ICD-10-CM | POA: Insufficient documentation

## 2011-10-26 ENCOUNTER — Ambulatory Visit (HOSPITAL_COMMUNITY)
Admission: RE | Admit: 2011-10-26 | Discharge: 2011-10-26 | Disposition: A | Discharge status: Home / Self Care | Payer: Medicare Other | Source: Ambulatory Visit | Attending: Internal Medicine | Admitting: Internal Medicine

## 2011-10-26 DIAGNOSIS — Z1231 Encounter for screening mammogram for malignant neoplasm of breast: Secondary | ICD-10-CM | POA: Insufficient documentation

## 2011-11-01 ENCOUNTER — Other Ambulatory Visit: Payer: Self-pay | Admitting: Internal Medicine

## 2011-11-01 DIAGNOSIS — C3490 Malignant neoplasm of unspecified part of unspecified bronchus or lung: Secondary | ICD-10-CM

## 2011-11-02 ENCOUNTER — Ambulatory Visit (HOSPITAL_COMMUNITY)
Admission: RE | Admit: 2011-11-02 | Discharge: 2011-11-02 | Disposition: A | Discharge status: Home / Self Care | Payer: Medicare Other | Source: Ambulatory Visit | Attending: Physician Assistant | Admitting: Physician Assistant

## 2011-11-02 ENCOUNTER — Encounter (HOSPITAL_COMMUNITY): Payer: Self-pay

## 2011-11-02 DIAGNOSIS — C3490 Malignant neoplasm of unspecified part of unspecified bronchus or lung: Secondary | ICD-10-CM

## 2011-11-02 DIAGNOSIS — C7952 Secondary malignant neoplasm of bone marrow: Secondary | ICD-10-CM | POA: Insufficient documentation

## 2011-11-02 DIAGNOSIS — C7951 Secondary malignant neoplasm of bone: Secondary | ICD-10-CM | POA: Insufficient documentation

## 2011-11-02 MED ORDER — IOHEXOL 300 MG/ML  SOLN
100.0000 mL | Freq: Once | INTRAMUSCULAR | Status: AC | PRN
  Administered 2011-11-02: 100 mL via INTRAVENOUS

## 2011-11-05 ENCOUNTER — Other Ambulatory Visit: Payer: Self-pay | Admitting: Internal Medicine

## 2011-11-06 ENCOUNTER — Other Ambulatory Visit (HOSPITAL_BASED_OUTPATIENT_CLINIC_OR_DEPARTMENT_OTHER): Payer: Medicare Other | Admitting: Lab

## 2011-11-06 ENCOUNTER — Ambulatory Visit (HOSPITAL_BASED_OUTPATIENT_CLINIC_OR_DEPARTMENT_OTHER): Payer: Medicare Other

## 2011-11-06 ENCOUNTER — Ambulatory Visit: Payer: Medicare Other | Admitting: Internal Medicine

## 2011-11-06 DIAGNOSIS — Z5111 Encounter for antineoplastic chemotherapy: Secondary | ICD-10-CM

## 2011-11-06 DIAGNOSIS — C3490 Malignant neoplasm of unspecified part of unspecified bronchus or lung: Secondary | ICD-10-CM

## 2011-11-06 DIAGNOSIS — C7951 Secondary malignant neoplasm of bone: Secondary | ICD-10-CM

## 2011-11-06 LAB — CBC WITH DIFFERENTIAL/PLATELET
Basophils Absolute: 0.1 10*3/uL (ref 0.0–0.1)
Eosinophils Absolute: 0.1 10*3/uL (ref 0.0–0.5)
HGB: 12.4 g/dL (ref 11.6–15.9)
LYMPH%: 45.9 % (ref 14.0–49.7)
MCV: 96 fL (ref 79.5–101.0)
MONO#: 0.4 10*3/uL (ref 0.1–0.9)
MONO%: 10.4 % (ref 0.0–14.0)
NEUT#: 1.5 10*3/uL (ref 1.5–6.5)
Platelets: 254 10*3/uL (ref 145–400)
RDW: 12.8 % (ref 11.2–14.5)

## 2011-11-06 LAB — COMPREHENSIVE METABOLIC PANEL
BUN: 15 mg/dL (ref 6–23)
CO2: 25 mEq/L (ref 19–32)
Glucose, Bld: 92 mg/dL (ref 70–99)
Sodium: 137 mEq/L (ref 135–145)
Total Bilirubin: 0.4 mg/dL (ref 0.3–1.2)
Total Protein: 6.6 g/dL (ref 6.0–8.3)

## 2011-11-06 MED ORDER — SODIUM CHLORIDE 0.9 % IV SOLN
Freq: Once | INTRAVENOUS | Status: AC
  Administered 2011-11-06: 13:00:00 via INTRAVENOUS

## 2011-11-06 MED ORDER — DEXAMETHASONE SODIUM PHOSPHATE 10 MG/ML IJ SOLN
10.0000 mg | Freq: Once | INTRAMUSCULAR | Status: AC
  Administered 2011-11-06: 10 mg via INTRAVENOUS

## 2011-11-06 MED ORDER — ONDANSETRON 8 MG/50ML IVPB (CHCC)
8.0000 mg | Freq: Once | INTRAVENOUS | Status: AC
  Administered 2011-11-06: 8 mg via INTRAVENOUS

## 2011-11-06 MED ORDER — SODIUM CHLORIDE 0.9 % IV SOLN: Freq: Once | INTRAVENOUS | Status: DC

## 2011-11-06 MED ORDER — SODIUM CHLORIDE 0.9 % IV SOLN
500.0000 mg/m2 | Freq: Once | INTRAVENOUS | Status: AC
  Administered 2011-11-06: 875 mg via INTRAVENOUS
  Filled 2011-11-06: qty 35

## 2011-11-06 MED ORDER — ZOLEDRONIC ACID 4 MG/100ML IV SOLN
4.0000 mg | Freq: Once | INTRAVENOUS | Status: AC
  Administered 2011-11-06: 4 mg via INTRAVENOUS
  Filled 2011-11-06: qty 100

## 2011-11-06 NOTE — Progress Notes (Signed)
[facility] OFFICE PROGRESS NOTE  [name], MD, MD [address] [facility] [address] 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   PRIOR THERAPY:  1. Status post left upper lobectomy with mediastinal lymph node dissection under the care of Dr. Dorris Fetch on December 10, 2007.  2. Status post 4 cycles of adjuvant chemotherapy with cisplatin and Taxotere. Last dose was given 03/22/2008.  3. Status post 6 cycles of systemic chemotherapy with carboplatin and Alimta for metastatic disease in the bone. The last dose was given July 02, 2010, with stable disease.  4. Status post 12 cycles of maintenance Alimta at 500 mg/sq m given every 3 weeks. The last dose was given 05/08/2011.  CURRENT THERAPY:  1. Maintenance Alimta at 500 mg/sq m given every 4 weeks. The patient is status post 6 cycles.  2. Zometa 4 mg IV given every month for bone metastasis.  3.   INTERVAL HISTORY: Tamara Ortiz 71 y.o. female returns to the clinic today for followup visit. The patient is tolerating her treatment with single agent Alimta fairly well. She denied having any significant chest pain or shortness of breath. No bony pain. She has no weight loss or night sweats. No fever or chills, no nausea or vomiting. She has repeat CT scan of the chest, abdomen and pelvis performed recently and she is here today for evaluation and discussion of her scan results.  MEDICAL HISTORY: Past Medical History  Diagnosis Date  . Hypertension   . HYPERLIPIDEMIA 11/05/2007  . ANXIETY 05/13/2009  . DEPRESSIVE DISORDER 05/06/2008  . HYPERTENSION 11/05/2007  . ALLERGIC RHINITIS 11/05/2007  . CONSTIPATION, CHRONIC 05/13/2009  . UTI 05/06/2008  . BACK PAIN 05/11/2008  . OSTEOPOROSIS 11/05/2007  . INSOMNIA-SLEEP  DISORDER-UNSPEC 05/13/2009  . FATIGUE 05/13/2009  . Swelling, mass, or lump in chest 11/05/2007  . LUNG CANCER, HX OF 05/06/2008  . Cancer     lung ca    ALLERGIES:  is allergic to amoxicillin.  MEDICATIONS:  Current Outpatient Prescriptions  Medication Sig Dispense Refill  . aspirin 81 MG EC tablet Take 81 mg by mouth daily.        . Calcium Carbonate-Vitamin D (CALCIUM-VITAMIN D) 500-200 MG-UNIT per tablet Take 1 tablet by mouth 2 (two) times daily.        . Coenzyme Q10 (COQ10) 100 MG CAPS Take 100 mg by mouth daily.        . Flaxseed, Linseed, (FLAXSEED OIL) 1000 MG CAPS Take 1,000 mg by mouth daily.        . folic acid (FOLVITE) 1 MG tablet TAKE 1 TABLET BY MOUTH DAILY  90 tablet  0  . LORazepam (ATIVAN) 0.5 MG tablet TAKE 1 TABLET BY MOUTH EVERY 8 HOURS AS NEEDED  40 tablet  0  . losartan-hydrochlorothiazide (HYZAAR) 50-12.5 MG per tablet Take 1 tablet by mouth daily.  90 tablet  3  . Multiple Vitamin (ANTIOXIDANT A/C/E PO) Take by mouth daily.        . Omega-3 350 MG CAPS Take by mouth daily.        . polyethylene glycol powder (MIRALAX) powder Take 17 g by mouth daily.        . Probiotic Product (PROBIOTIC FORMULA) CAPS Take 1 capsule by  mouth daily.        . Red Yeast Rice 600 MG CAPS Take 600 mg by mouth 2 (two) times daily.        . simvastatin (ZOCOR) 40 MG tablet Take 1 tablet (40 mg total) by mouth every evening.  90 tablet  3  . vitamin B-12 (CYANOCOBALAMIN) 100 MCG tablet Take 50 mcg by mouth daily.        . vitamin E (CVS VITAMIN E) 400 UNIT capsule Take 400 Units by mouth daily.        [name] zolendronic acid (ZOMETA) 4 MG/5ML injection Inject 4 mg into the vein once.         No current facility-administered medications for this visit.   Facility-Administered Medications Ordered in Other Visits  Medication Dose Route Frequency Provider Last Rate Last Dose  . 0.9 %  sodium chloride infusion   Intravenous Once Sebastyan Snodgrass K. Elnoria Livingston, MD      . dexamethasone (DECADRON) injection 10  mg  10 mg Intravenous Once Sherma Vanmetre K. Jacek Colson, MD   10 mg at 11/06/11 1301  . ondansetron (ZOFRAN) IVPB 8 mg  8 mg Intravenous Once Tanay Misuraca K. Aunika Kirsten, MD   8 mg at 11/06/11 1301  . PEMEtrexed (ALIMTA) 875 mg in sodium chloride 0.9 % 100 mL chemo infusion  500 mg/m2 (Treatment Plan Actual) Intravenous Once Glenford Garis K. Kylina Vultaggio, MD   875 mg at 11/06/11 1336  . Zoledronic Acid (ZOMETA) 4 mg IVPB  4 mg Intravenous Once Dolores Ewing K. John Williamsen, MD   4 mg at 11/06/11 1317  . DISCONTD: 0.9 %  sodium chloride infusion   Intravenous Once Keatin Benham K. Arbutus Ped, MD        SURGICAL HISTORY:  Past Surgical History  Procedure Date  . Tubal ligation   . S/p lul lung surgury 12/2007  . Lobectomy     REVIEW OF SYSTEMS:  A comprehensive review of systems was negative.   PHYSICAL EXAMINATION: General appearance: alert, cooperative and no distress Resp: clear to auscultation bilaterally Cardio: regular rate and rhythm, S1, S2 normal, no murmur, click, rub or gallop GI: soft, non-tender; bowel sounds normal; no masses,  no organomegaly Extremities: extremities normal, atraumatic, no cyanosis or edema  ECOG PERFORMANCE STATUS: 0 - Asymptomatic  Blood pressure 128/79, pulse 60, temperature 97.2 F (36.2 C), temperature source Oral, height 5\' 5"  (1.651 m), weight 147 lb 9.6 oz (66.951 kg).  LABORATORY DATA: Lab Results  Component Value Date   WBC 3.8* 11/06/2011   HGB 12.4 11/06/2011   HCT 36.3 11/06/2011   MCV 96.0 11/06/2011   PLT 254 11/06/2011      Chemistry      Component Value Date/Time   NA 137 11/06/2011 1021   K 4.0 11/06/2011 1021   CL 100 11/06/2011 1021   CO2 25 11/06/2011 1021   BUN 15 11/06/2011 1021   CREATININE 1.03 11/06/2011 1021      Component Value Date/Time   CALCIUM 10.4 11/06/2011 1021   ALKPHOS 90 11/06/2011 1021   AST 27 11/06/2011 1021   ALT 20 11/06/2011 1021   BILITOT 0.4 11/06/2011 1021       RADIOGRAPHIC STUDIES: Ct Chest W Contrast  11/02/2011  *RADIOLOGY REPORT*  Clinical Data:  Lung cancer  metastatic to bone diagnosed 2009. Ongoing chemotherapy.  CT CHEST, ABDOMEN AND PELVIS WITH CONTRAST  Technique:  Multidetector CT imaging of the chest, abdomen and pelvis was performed following the standard protocol during bolus administration of intravenous contrast.  Contrast:  OMNIPAQUE IOHEXOL 300 MG/ML IV SOLN  Comparison:  08/09/2011  CT CHEST  Findings:  Small amount of fluid in the superior pericardial recess is stable.  No pleural effusion.  Moderate atherosclerotic coronary arterial calcification noted.  Great vessels are normal in caliber. No lymphadenopathy.  Small hiatal hernia again noted.  Curvilinear superior segment left lower lobe subpleural scarring is stable. Evidence of left upper lobectomy.  No mass at the resection site. No new pulmonary nodule, mass, or consolidation.  No intrabronchial filling defect.  IMPRESSION: No new abnormality within the thorax or evidence for intrathoracic local metastatic disease/recurrence.  Evidence of prior left upper lobectomy.  CT ABDOMEN AND PELVIS  Findings:  Liver, gallbladder, adrenal glands, spleen, pancreas, and kidneys are normal.  Scattered atherosclerotic calcification noted without aortic aneurysm.  Uterus and ovaries are normal.  Bladder is normal.  Colon and small bowel are normal in appearance.  The appendix is not identified but there is no secondary evidence for acute appendicitis.  No pelvic free fluid or lymphadenopathy.  Lumbar spine degenerative changes are noted.  Sclerotic lesions within the sacrum, iliac bone, and T6 vertebral body again noted. (The T6 lesion was previously reported as the T5 level, although there are four lumbar-type vertebral bodies, which could alter the numbering nomenclature. There is a stable sclerotic lesion in the right posteromedial seventh rib.  IMPRESSION: No new evidence for intra-abdominal or pelvic metastatic disease. Stable sclerotic osseous disease as above.  Original Report Authenticated By: Harrel Lemon, M.D.   Ct Abdomen Pelvis W Contrast  11/02/2011  *RADIOLOGY REPORT*  Clinical Data:  Lung cancer metastatic to bone diagnosed 2009. Ongoing chemotherapy.  CT CHEST, ABDOMEN AND PELVIS WITH CONTRAST  Technique:  Multidetector CT imaging of the chest, abdomen and pelvis was performed following the standard protocol during bolus administration of intravenous contrast.  Contrast: OMNIPAQUE IOHEXOL 300 MG/ML IV SOLN  Comparison:  08/09/2011  CT CHEST  Findings:  Small amount of fluid in the superior pericardial recess is stable.  No pleural effusion.  Moderate atherosclerotic coronary arterial calcification noted.  Great vessels are normal in caliber. No lymphadenopathy.  Small hiatal hernia again noted.  Curvilinear superior segment left lower lobe subpleural scarring is stable. Evidence of left upper lobectomy.  No mass at the resection site. No new pulmonary nodule, mass, or consolidation.  No intrabronchial filling defect.  IMPRESSION: No new abnormality within the thorax or evidence for intrathoracic local metastatic disease/recurrence.  Evidence of prior left upper lobectomy.  CT ABDOMEN AND PELVIS  Findings:  Liver, gallbladder, adrenal glands, spleen, pancreas, and kidneys are normal.  Scattered atherosclerotic calcification noted without aortic aneurysm.  Uterus and ovaries are normal.  Bladder is normal.  Colon and small bowel are normal in appearance.  The appendix is not identified but there is no secondary evidence for acute appendicitis.  No pelvic free fluid or lymphadenopathy.  Lumbar spine degenerative changes are noted.  Sclerotic lesions within the sacrum, iliac bone, and T6 vertebral body again noted. (The T6 lesion was previously reported as the T5 level, although there are four lumbar-type vertebral bodies, which could alter the numbering nomenclature. There is a stable sclerotic lesion in the right posteromedial seventh rib.  IMPRESSION: No new evidence for intra-abdominal or  pelvic metastatic disease. Stable sclerotic osseous disease as above.  Original Report Authenticated By: Harrel Lemon, M.D.   Mm Digital Screening  10/30/2011  DG SCREEN MAMMOGRAM BILATERAL Bilateral CC and MLO view(s) were taken.  Technologist: Ginger Bingman, RT RM  DIGITAL SCREENING MAMMOGRAM WITH CAD:  Comparison:  Prior studies.  The breast tissue is heterogeneously dense.  There is no dominant mass, architectural distortion or calcification to suggest malignancy.  Images were processed with CAD.  IMPRESSION:  No mammographic evidence of malignancy.  Suggest yearly screening mammography.  A result letter of this screening mammogram will be mailed directly to the patient.   ASSESSMENT: Negative - BI-RADS 1  Screening mammogram in 1 year. ,    ASSESSMENT: This is a very pleasant 71 years old white female with metastatic non-small cell lung cancer, adenocarcinoma. She is currently on maintenance chemotherapy with single agent Alimta every month. The patient is doing fine and tolerating her treatment fairly well. She has no evidence for disease progression on his recent scan.   PLAN: I discussed the scan results with the patient and recommended for her to continue with the same maintenance regimen for now. I would see her back for followup visit in one month. For the bone disease the patient will continue on Zometa as previously scheduled and she was advised to keep good dental hygiene. She will also continue on calcium and vitamin D. She was advised to call me immediately if she has any concerning symptoms in the interval.  All questions were answered. The patient knows to call the clinic with any problems, questions or concerns. We can certainly see the patient much sooner if necessary.

## 2011-11-27 ENCOUNTER — Other Ambulatory Visit: Payer: Self-pay | Admitting: *Deleted

## 2011-11-27 MED ORDER — SIMVASTATIN 40 MG PO TABS: 40.0000 mg | ORAL_TABLET | Freq: Every evening | ORAL | Status: DC

## 2011-11-27 MED ORDER — LOSARTAN POTASSIUM-HCTZ 50-12.5 MG PO TABS: 1.0000 | ORAL_TABLET | Freq: Every day | ORAL | Status: DC

## 2011-11-27 NOTE — Telephone Encounter (Signed)
Pt needs Simvastatin and Losartan rx sent to new mail order company-[facility]-rx sent, pt informed via VM and to callback office with any questions/concerns.

## 2011-11-30 ENCOUNTER — Other Ambulatory Visit: Payer: Self-pay | Admitting: Internal Medicine

## 2011-11-30 DIAGNOSIS — C3490 Malignant neoplasm of unspecified part of unspecified bronchus or lung: Secondary | ICD-10-CM

## 2011-12-04 ENCOUNTER — Ambulatory Visit (HOSPITAL_BASED_OUTPATIENT_CLINIC_OR_DEPARTMENT_OTHER): Payer: BLUE CROSS/BLUE SHIELD | Admitting: Physician Assistant

## 2011-12-04 ENCOUNTER — Encounter: Payer: Self-pay | Admitting: Physician Assistant

## 2011-12-04 ENCOUNTER — Other Ambulatory Visit: Payer: Self-pay | Admitting: *Deleted

## 2011-12-04 ENCOUNTER — Ambulatory Visit (HOSPITAL_BASED_OUTPATIENT_CLINIC_OR_DEPARTMENT_OTHER): Payer: BLUE CROSS/BLUE SHIELD

## 2011-12-04 ENCOUNTER — Other Ambulatory Visit: Payer: Medicare Other | Admitting: Lab

## 2011-12-04 ENCOUNTER — Telehealth: Payer: Self-pay | Admitting: Internal Medicine

## 2011-12-04 VITALS — BP 108/71 | HR 58 | Temp 98.5°F | Ht 65.0 in | Wt 150.1 lb

## 2011-12-04 DIAGNOSIS — C7951 Secondary malignant neoplasm of bone: Secondary | ICD-10-CM

## 2011-12-04 DIAGNOSIS — C7952 Secondary malignant neoplasm of bone marrow: Secondary | ICD-10-CM

## 2011-12-04 DIAGNOSIS — Z5111 Encounter for antineoplastic chemotherapy: Secondary | ICD-10-CM

## 2011-12-04 DIAGNOSIS — E785 Hyperlipidemia, unspecified: Secondary | ICD-10-CM

## 2011-12-04 DIAGNOSIS — C3490 Malignant neoplasm of unspecified part of unspecified bronchus or lung: Secondary | ICD-10-CM

## 2011-12-04 DIAGNOSIS — C801 Malignant (primary) neoplasm, unspecified: Secondary | ICD-10-CM

## 2011-12-04 LAB — CBC WITH DIFFERENTIAL/PLATELET
BASO%: 0.4 % (ref 0.0–2.0)
MCHC: 34.1 g/dL (ref 31.5–36.0)
MONO#: 0.5 10*3/uL (ref 0.1–0.9)
RBC: 3.78 10*6/uL (ref 3.70–5.45)
WBC: 4.8 10*3/uL (ref 3.9–10.3)
lymph#: 1.8 10*3/uL (ref 0.9–3.3)
nRBC: 0 % (ref 0–0)

## 2011-12-04 LAB — BASIC METABOLIC PANEL
BUN: 19 mg/dL (ref 6–23)
Chloride: 103 mEq/L (ref 96–112)
Potassium: 4.1 mEq/L (ref 3.5–5.3)

## 2011-12-04 MED ORDER — PEMETREXED DISODIUM CHEMO INJECTION 500 MG
500.0000 mg/m2 | Freq: Once | INTRAVENOUS | Status: AC
  Administered 2011-12-04: 875 mg via INTRAVENOUS
  Filled 2011-12-04: qty 35

## 2011-12-04 MED ORDER — ZOLEDRONIC ACID 4 MG/100ML IV SOLN
4.0000 mg | Freq: Once | INTRAVENOUS | Status: AC
  Administered 2011-12-04: 4 mg via INTRAVENOUS
  Filled 2011-12-04: qty 100

## 2011-12-04 MED ORDER — DEXAMETHASONE SODIUM PHOSPHATE 10 MG/ML IJ SOLN
10.0000 mg | Freq: Once | INTRAMUSCULAR | Status: AC
  Administered 2011-12-04: 10 mg via INTRAVENOUS

## 2011-12-04 MED ORDER — CYANOCOBALAMIN 1000 MCG/ML IJ SOLN
1000.0000 ug | Freq: Once | INTRAMUSCULAR | Status: AC
  Administered 2011-12-04: 1000 ug via INTRAMUSCULAR

## 2011-12-04 MED ORDER — ONDANSETRON 8 MG/50ML IVPB (CHCC)
8.0000 mg | Freq: Once | INTRAVENOUS | Status: AC
  Administered 2011-12-04: 8 mg via INTRAVENOUS

## 2011-12-04 NOTE — Telephone Encounter (Signed)
appt mae and printed for pt 01/02/12 aom

## 2011-12-04 NOTE — Patient Instructions (Signed)
Pt will call with any problems 

## 2011-12-05 ENCOUNTER — Telehealth: Payer: Self-pay | Admitting: Internal Medicine

## 2011-12-05 NOTE — Telephone Encounter (Signed)
Call forwarded.

## 2011-12-05 NOTE — Progress Notes (Signed)
[facility] OFFICE PROGRESS NOTE  [name], MD, MD [address] [facility] 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   PRIOR THERAPY:  1. Status post left upper lobectomy with mediastinal lymph node dissection under the care of Dr. Dorris Fetch on December 10, 2007.  2. Status post 4 cycles of adjuvant chemotherapy with cisplatin and Taxotere. Last dose was given 03/22/2008.  3. Status post 6 cycles of systemic chemotherapy with carboplatin and Alimta for metastatic disease in the bone. The last dose was given July 02, 2010, with stable disease.  4. Status post 12 cycles of maintenance Alimta at 500 mg/sq m given every 3 weeks. The last dose was given 05/08/2011.  CURRENT THERAPY:  1. Maintenance Alimta at 500 mg/sq m given every 4 weeks. The patient is status post 7 cycles.  2. Zometa 4 mg IV given every month for bone metastasis.   INTERVAL HISTORY: Tamara Ortiz 71 y.o. female returns to the clinic today for followup visit. The patient is tolerating her treatment with single agent Alimta fairly well. She somplains of fatigue but is still able to exercise. She recently took 2 aerobics classes and a line dancing class. She has mild constipation that is well managed with MiraLax. She denied having any significant chest pain or shortness of breath. Denied bony pain. She has no weight loss or night sweats, no fever or chills, no nausea or vomiting.   MEDICAL HISTORY: Past Medical History  Diagnosis Date  . Hypertension   . HYPERLIPIDEMIA 11/05/2007  . ANXIETY 05/13/2009  . DEPRESSIVE DISORDER 05/06/2008  . HYPERTENSION 11/05/2007  . ALLERGIC RHINITIS 11/05/2007  . CONSTIPATION, CHRONIC 05/13/2009  . UTI 05/06/2008  . BACK PAIN 05/11/2008  . OSTEOPOROSIS 11/05/2007   . INSOMNIA-SLEEP DISORDER-UNSPEC 05/13/2009  . FATIGUE 05/13/2009  . Swelling, mass, or lump in chest 11/05/2007  . LUNG CANCER, HX OF 05/06/2008  . Cancer     lung ca    ALLERGIES:  is allergic to amoxicillin.  MEDICATIONS:  Current Outpatient Prescriptions  Medication Sig Dispense Refill  . aspirin 81 MG EC tablet Take 81 mg by mouth daily.        . Calcium Carbonate-Vitamin D (CALCIUM-VITAMIN D) 500-200 MG-UNIT per tablet Take 1 tablet by mouth 2 (two) times daily.        . Coenzyme Q10 (COQ10) 100 MG CAPS Take 100 mg by mouth daily.        . Flaxseed, Linseed, (FLAXSEED OIL) 1000 MG CAPS Take 1,000 mg by mouth daily.        . folic acid (FOLVITE) 1 MG tablet TAKE 1 TABLET BY MOUTH DAILY  90 tablet  0  . LORazepam (ATIVAN) 0.5 MG tablet TAKE 1 TABLET BY MOUTH EVERY 8 HOURS AS NEEDED  40 tablet  0  . losartan-hydrochlorothiazide (HYZAAR) 50-12.5 MG per tablet Take 1 tablet by mouth daily.  90 tablet  2  . Multiple Vitamin (ANTIOXIDANT A/C/E PO) Take by mouth daily.        . Omega-3 350 MG CAPS Take by mouth daily.        . polyethylene glycol powder (MIRALAX) powder Take 17 g by mouth daily.        . Probiotic Product (PROBIOTIC FORMULA) CAPS  Take 1 capsule by mouth daily.        . Red Yeast Rice 600 MG CAPS Take 600 mg by mouth 2 (two) times daily.        . simvastatin (ZOCOR) 40 MG tablet Take 1 tablet (40 mg total) by mouth every evening.  90 tablet  2  . vitamin B-12 (CYANOCOBALAMIN) 100 MCG tablet Take 50 mcg by mouth daily.        . vitamin E ([employer] VITAMIN E) 400 UNIT capsule Take 400 Units by mouth daily.        [name] zolendronic acid (ZOMETA) 4 MG/5ML injection Inject 4 mg into the vein once.        [name] HYDROcodone-acetaminophen (NORCO) 10-325 MG per tablet         SURGICAL HISTORY:  Past Surgical History  Procedure Date  . Tubal ligation   . S/p lul lung surgury 12/2007  . Lobectomy     REVIEW OF SYSTEMS:  A comprehensive review of systems was negative except for:  Constitutional: positive for fatigue   PHYSICAL EXAMINATION: General appearance: alert, cooperative and no distress Resp: clear to auscultation bilaterally Cardio: regular rate and rhythm, S1, S2 normal, no murmur, click, rub or gallop GI: soft, non-tender; bowel sounds normal; no masses,  no organomegaly Extremities: extremities normal, atraumatic, no cyanosis or edema  ECOG PERFORMANCE STATUS: 0 - Asymptomatic  Blood pressure 108/71, pulse 58, temperature 98.5 F (36.9 C), temperature source Oral, height 5\' 5"  (1.651 m), weight 150 lb 1.6 oz (68.085 kg).  LABORATORY DATA: Lab Results  Component Value Date   WBC 4.8 12/04/2011   HGB 12.3 12/04/2011   HCT 36.1 12/04/2011   MCV 95.5 12/04/2011   PLT 247 12/04/2011      Chemistry      Component Value Date/Time   NA 138 12/04/2011 1114   K 4.1 12/04/2011 1114   CL 103 12/04/2011 1114   CO2 26 12/04/2011 1114   BUN 19 12/04/2011 1114   CREATININE 0.99 12/04/2011 1114      Component Value Date/Time   CALCIUM 10.1 12/04/2011 1114   ALKPHOS 90 11/06/2011 1021   AST 27 11/06/2011 1021   ALT 20 11/06/2011 1021   BILITOT 0.4 11/06/2011 1021       RADIOGRAPHIC STUDIES: Ct Chest W Contrast  11/02/2011  *RADIOLOGY REPORT*  Clinical Data:  Lung cancer metastatic to bone diagnosed 2009. Ongoing chemotherapy.  CT CHEST, ABDOMEN AND PELVIS WITH CONTRAST  Technique:  Multidetector CT imaging of the chest, abdomen and pelvis was performed following the standard protocol during bolus administration of intravenous contrast.  Contrast: OMNIPAQUE IOHEXOL 300 MG/ML IV SOLN  Comparison:  08/09/2011  CT CHEST  Findings:  Small amount of fluid in the superior pericardial recess is stable.  No pleural effusion.  Moderate atherosclerotic coronary arterial calcification noted.  Great vessels are normal in caliber. No lymphadenopathy.  Small hiatal hernia again noted.  Curvilinear superior segment left lower lobe subpleural scarring is stable. Evidence of left upper lobectomy.   No mass at the resection site. No new pulmonary nodule, mass, or consolidation.  No intrabronchial filling defect.  IMPRESSION: No new abnormality within the thorax or evidence for intrathoracic local metastatic disease/recurrence.  Evidence of prior left upper lobectomy.  CT ABDOMEN AND PELVIS  Findings:  Liver, gallbladder, adrenal glands, spleen, pancreas, and kidneys are normal.  Scattered atherosclerotic calcification noted without aortic aneurysm.  Uterus and ovaries are normal.  Bladder is normal.  Colon  and small bowel are normal in appearance.  The appendix is not identified but there is no secondary evidence for acute appendicitis.  No pelvic free fluid or lymphadenopathy.  Lumbar spine degenerative changes are noted.  Sclerotic lesions within the sacrum, iliac bone, and T6 vertebral body again noted. (The T6 lesion was previously reported as the T5 level, although there are four lumbar-type vertebral bodies, which could alter the numbering nomenclature. There is a stable sclerotic lesion in the right posteromedial seventh rib.  IMPRESSION: No new evidence for intra-abdominal or pelvic metastatic disease. Stable sclerotic osseous disease as above.  Original Report Authenticated By: Harrel Lemon, M.D.   Ct Abdomen Pelvis W Contrast  11/02/2011  *RADIOLOGY REPORT*  Clinical Data:  Lung cancer metastatic to bone diagnosed 2009. Ongoing chemotherapy.  CT CHEST, ABDOMEN AND PELVIS WITH CONTRAST  Technique:  Multidetector CT imaging of the chest, abdomen and pelvis was performed following the standard protocol during bolus administration of intravenous contrast.  Contrast: OMNIPAQUE IOHEXOL 300 MG/ML IV SOLN  Comparison:  08/09/2011  CT CHEST  Findings:  Small amount of fluid in the superior pericardial recess is stable.  No pleural effusion.  Moderate atherosclerotic coronary arterial calcification noted.  Great vessels are normal in caliber. No lymphadenopathy.  Small hiatal hernia again noted.   Curvilinear superior segment left lower lobe subpleural scarring is stable. Evidence of left upper lobectomy.  No mass at the resection site. No new pulmonary nodule, mass, or consolidation.  No intrabronchial filling defect.  IMPRESSION: No new abnormality within the thorax or evidence for intrathoracic local metastatic disease/recurrence.  Evidence of prior left upper lobectomy.  CT ABDOMEN AND PELVIS  Findings:  Liver, gallbladder, adrenal glands, spleen, pancreas, and kidneys are normal.  Scattered atherosclerotic calcification noted without aortic aneurysm.  Uterus and ovaries are normal.  Bladder is normal.  Colon and small bowel are normal in appearance.  The appendix is not identified but there is no secondary evidence for acute appendicitis.  No pelvic free fluid or lymphadenopathy.  Lumbar spine degenerative changes are noted.  Sclerotic lesions within the sacrum, iliac bone, and T6 vertebral body again noted. (The T6 lesion was previously reported as the T5 level, although there are four lumbar-type vertebral bodies, which could alter the numbering nomenclature. There is a stable sclerotic lesion in the right posteromedial seventh rib.  IMPRESSION: No new evidence for intra-abdominal or pelvic metastatic disease. Stable sclerotic osseous disease as above.  Original Report Authenticated By: Harrel Lemon, M.D.   Mm Digital Screening  10/30/2011  DG SCREEN MAMMOGRAM BILATERAL Bilateral CC and MLO view(s) were taken. Technologist: Ginger Bingman, RT RM  DIGITAL SCREENING MAMMOGRAM WITH CAD:  Comparison:  Prior studies.  The breast tissue is heterogeneously dense.  There is no dominant mass, architectural distortion or calcification to suggest malignancy.  Images were processed with CAD.  IMPRESSION:  No mammographic evidence of malignancy.  Suggest yearly screening mammography.  A result letter of this screening mammogram will be mailed directly to the patient.   ASSESSMENT: Negative - BI-RADS 1   Screening mammogram in 1 year. ,    ASSESSMENT/PLAN: This is a very pleasant 71 years old white female with metastatic non-small cell lung cancer, adenocarcinoma. She is currently on maintenance chemotherapy with single agent Alimta every month. The patient is doing fine and tolerating her treatment fairly well. She has no evidence for disease progression on her recent scan. The patient was discussed with Dr. Arbutus Ped. She will continue  on maintenance Alimta given on a monthly basis in addition to her monthly Zometa infusions.  She will also continue on calcium and vitamin D. All questions were answered.   Marland KitchenConni Slipper, PA-C   The patient knows to call the clinic with any problems, questions or concerns. We can certainly see the patient much sooner if necessary.

## 2011-12-06 ENCOUNTER — Telehealth: Payer: Self-pay | Admitting: Internal Medicine

## 2011-12-06 NOTE — Telephone Encounter (Signed)
Recall Project: Pt will call us to schedule after discussing with her oncologist. Pt is still undergoing chemo tx

## 2011-12-21 ENCOUNTER — Ambulatory Visit (INDEPENDENT_AMBULATORY_CARE_PROVIDER_SITE_OTHER): Payer: BLUE CROSS/BLUE SHIELD | Admitting: Endocrinology

## 2011-12-21 ENCOUNTER — Encounter: Payer: Self-pay | Admitting: Endocrinology

## 2011-12-21 VITALS — BP 122/78 | HR 74 | Temp 99.5°F | Wt 145.4 lb

## 2011-12-21 DIAGNOSIS — R300 Dysuria: Secondary | ICD-10-CM

## 2011-12-21 DIAGNOSIS — J069 Acute upper respiratory infection, unspecified: Secondary | ICD-10-CM

## 2011-12-21 LAB — POCT URINALYSIS DIPSTICK
Bilirubin, UA: NEGATIVE
Blood, UA: NEGATIVE
Glucose, UA: NEGATIVE
Ketones, UA: NEGATIVE
Leukocytes, UA: NEGATIVE
Nitrite, UA: NEGATIVE
Protein, UA: NEGATIVE
Spec Grav, UA: 1.01
Urobilinogen, UA: NEGATIVE
pH, UA: 7.5

## 2011-12-21 MED ORDER — DOXYCYCLINE HYCLATE 100 MG PO TABS: 100.0000 mg | ORAL_TABLET | Freq: Two times a day (BID) | ORAL | Status: AC

## 2011-12-21 NOTE — Progress Notes (Signed)
Subjective:    Patient ID: Earlie Arciga, female    DOB: 01-Feb-1941, 71 y.o.   MRN: [id number]  HPI Pt states few days of moderate congestion in the nose, and assoc dysuria.  Past Medical History  Diagnosis Date  . Hypertension   . HYPERLIPIDEMIA 11/05/2007  . ANXIETY 05/13/2009  . DEPRESSIVE DISORDER 05/06/2008  . HYPERTENSION 11/05/2007  . ALLERGIC RHINITIS 11/05/2007  . CONSTIPATION, CHRONIC 05/13/2009  . UTI 05/06/2008  . BACK PAIN 05/11/2008  . OSTEOPOROSIS 11/05/2007  . INSOMNIA-SLEEP DISORDER-UNSPEC 05/13/2009  . FATIGUE 05/13/2009  . Swelling, mass, or lump in chest 11/05/2007  . LUNG CANCER, HX OF 05/06/2008  . Cancer     lung ca    Past Surgical History  Procedure Date  . Tubal ligation   . S/p lul lung surgury 12/2007  . Lobectomy     History   Social History  . Marital Status: Divorced    Spouse Name: N/A    Number of Children: N/A  . Years of Education: N/A   Occupational History  . 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 Smoking status: Never Smoker   . Smokeless tobacco: Never Used  . Alcohol Use: Yes  . Drug Use: No  . Sexually Active: No   Other Topics Concern  . Not on file   Social History Narrative  . No narrative on file    Current Outpatient Prescriptions on File Prior to Visit  Medication Sig Dispense Refill  . aspirin 81 MG EC tablet Take 81 mg by mouth daily.        . Calcium Carbonate-Vitamin D (CALCIUM-VITAMIN D) 500-200 MG-UNIT per tablet Take 1 tablet by mouth 2 (two) times daily.        . Coenzyme Q10 (COQ10) 100 MG CAPS Take 100 mg by mouth daily.        . Flaxseed, Linseed, (FLAXSEED OIL) 1000 MG CAPS Take 1,000 mg by mouth daily.        . folic acid (FOLVITE) 1 MG tablet TAKE 1 TABLET BY MOUTH DAILY  90 tablet  0  . HYDROcodone-acetaminophen (NORCO) 10-325 MG per tablet       . LORazepam (ATIVAN) 0.5 MG tablet TAKE 1 TABLET BY MOUTH EVERY 8 HOURS AS NEEDED   40 tablet  0  . losartan-hydrochlorothiazide (HYZAAR) 50-12.5 MG per tablet Take 1 tablet by mouth daily.  90 tablet  2  . Multiple Vitamin (ANTIOXIDANT A/C/E PO) Take by mouth daily.        . Omega-3 350 MG CAPS Take by mouth daily.        . polyethylene glycol powder (MIRALAX) powder Take 17 g by mouth daily.        . Probiotic Product (PROBIOTIC FORMULA) CAPS Take 1 capsule by mouth daily.        . Red Yeast Rice 600 MG CAPS Take 600 mg by mouth 2 (two) times daily.        . simvastatin (ZOCOR) 40 MG tablet Take 1 tablet (40 mg total) by mouth every evening.  90 tablet  2  . vitamin B-12 (CYANOCOBALAMIN) 100 MCG tablet Take 50 mcg by mouth daily.        . vitamin E (CVS VITAMIN E) 400 UNIT capsule Take 400 Units by mouth daily.        [name] zolendronic acid (ZOMETA) 4 MG/5ML injection Inject 4 mg  into the vein once.          Allergies  Allergen Reactions  . Amoxicillin     REACTION: hives/itch    Family History  Problem Relation Age of Onset  . Heart disease Mother   . ALS Father   . Alcohol abuse Son     ETOH    BP 122/78  Pulse 74  Temp(Src) 99.5 F (37.5 C) (Oral)  Wt 145 lb 6.4 oz (65.953 kg)  SpO2 97%  Review of Systems Denies fever. Slight left otalgia    Objective:   Physical Exam VITAL SIGNS:  See vs page GENERAL: no distress head: no deformity eyes: no periorbital swelling, no proptosis external nose and ears are normal mouth: no lesion seen LUNGS:  Clear to auscultation   (i reviewed ua result)    Assessment & Plan:  Glenford Peers, new Urinary sxs, uncertain etiology

## 2011-12-21 NOTE — Patient Instructions (Addendum)
i have sent a prescription to your pharmacy, for an antibiotic. Loratadine-d (non-prescription) will help your congestion. I hope you feel better soon.  If you don't feel better by next week, please call dr john.   

## 2012-01-02 ENCOUNTER — Other Ambulatory Visit (HOSPITAL_BASED_OUTPATIENT_CLINIC_OR_DEPARTMENT_OTHER): Payer: Medicare Other | Admitting: Lab

## 2012-01-02 ENCOUNTER — Ambulatory Visit (HOSPITAL_BASED_OUTPATIENT_CLINIC_OR_DEPARTMENT_OTHER): Payer: Medicare Other

## 2012-01-02 ENCOUNTER — Ambulatory Visit (HOSPITAL_BASED_OUTPATIENT_CLINIC_OR_DEPARTMENT_OTHER): Payer: Medicare Other | Admitting: Physician Assistant

## 2012-01-02 ENCOUNTER — Encounter: Payer: Self-pay | Admitting: Physician Assistant

## 2012-01-02 ENCOUNTER — Telehealth: Payer: Self-pay | Admitting: Internal Medicine

## 2012-01-02 VITALS — BP 118/74 | HR 53 | Temp 97.5°F | Ht 65.0 in | Wt 149.8 lb

## 2012-01-02 DIAGNOSIS — C3490 Malignant neoplasm of unspecified part of unspecified bronchus or lung: Secondary | ICD-10-CM

## 2012-01-02 DIAGNOSIS — C3410 Malignant neoplasm of upper lobe, unspecified bronchus or lung: Secondary | ICD-10-CM

## 2012-01-02 DIAGNOSIS — Z5111 Encounter for antineoplastic chemotherapy: Secondary | ICD-10-CM

## 2012-01-02 DIAGNOSIS — C7951 Secondary malignant neoplasm of bone: Secondary | ICD-10-CM

## 2012-01-02 LAB — COMPREHENSIVE METABOLIC PANEL
ALT: 20 U/L (ref 0–35)
AST: 24 U/L (ref 0–37)
Alkaline Phosphatase: 59 U/L (ref 39–117)
CO2: 31 mEq/L (ref 19–32)
Sodium: 139 mEq/L (ref 135–145)
Total Bilirubin: 0.4 mg/dL (ref 0.3–1.2)
Total Protein: 6.5 g/dL (ref 6.0–8.3)

## 2012-01-02 LAB — CBC WITH DIFFERENTIAL/PLATELET
Eosinophils Absolute: 0 10*3/uL (ref 0.0–0.5)
MCV: 95.3 fL (ref 79.5–101.0)
MONO%: 9.4 % (ref 0.0–14.0)
NEUT#: 2 10*3/uL (ref 1.5–6.5)
RBC: 3.79 10*6/uL (ref 3.70–5.45)
RDW: 12.3 % (ref 11.2–14.5)
WBC: 4.2 10*3/uL (ref 3.9–10.3)
nRBC: 0 % (ref 0–0)

## 2012-01-02 MED ORDER — ZOLEDRONIC ACID 4 MG/100ML IV SOLN
4.0000 mg | Freq: Once | INTRAVENOUS | Status: AC
  Administered 2012-01-02: 4 mg via INTRAVENOUS
  Filled 2012-01-02: qty 100

## 2012-01-02 MED ORDER — DEXAMETHASONE SODIUM PHOSPHATE 10 MG/ML IJ SOLN
10.0000 mg | Freq: Once | INTRAMUSCULAR | Status: AC
  Administered 2012-01-02: 10 mg via INTRAVENOUS

## 2012-01-02 MED ORDER — LORAZEPAM 0.5 MG PO TABS: 0.5000 mg | ORAL_TABLET | Freq: Three times a day (TID) | ORAL | Status: DC | PRN

## 2012-01-02 MED ORDER — FOLIC ACID 1 MG PO TABS: 1.0000 mg | ORAL_TABLET | Freq: Every day | ORAL | Status: DC

## 2012-01-02 MED ORDER — SODIUM CHLORIDE 0.9 % IV SOLN
Freq: Once | INTRAVENOUS | Status: AC
  Administered 2012-01-02: 11:00:00 via INTRAVENOUS

## 2012-01-02 MED ORDER — ONDANSETRON 8 MG/50ML IVPB (CHCC)
8.0000 mg | Freq: Once | INTRAVENOUS | Status: AC
  Administered 2012-01-02: 8 mg via INTRAVENOUS

## 2012-01-02 MED ORDER — SODIUM CHLORIDE 0.9 % IV SOLN
500.0000 mg/m2 | Freq: Once | INTRAVENOUS | Status: AC
  Administered 2012-01-02: 875 mg via INTRAVENOUS
  Filled 2012-01-02: qty 35

## 2012-01-02 MED ORDER — ZOLEDRONIC ACID 4 MG/100ML IV SOLN
4.0000 mg | Freq: Once | INTRAVENOUS | Status: DC
  Filled 2012-01-02: qty 100

## 2012-01-02 NOTE — Telephone Encounter (Signed)
Gv pt appt for april2013.  scheduled pt for ct scan on 04/25 @ [facility] 

## 2012-01-02 NOTE — Progress Notes (Signed)
[facility] OFFICE PROGRESS NOTE  [name], MD, MD [address] 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   PRIOR THERAPY:  1. Status post left upper lobectomy with mediastinal lymph node dissection under the care of Dr. Dorris Fetch on December 10, 2007.  2. Status post 4 cycles of adjuvant chemotherapy with cisplatin and Taxotere. Last dose was given 03/22/2008.  3. Status post 6 cycles of systemic chemotherapy with carboplatin and Alimta for metastatic disease in the bone. The last dose was given July 02, 2010, with stable disease.  4. Status post 12 cycles of maintenance Alimta at 500 mg/sq m given every 3 weeks. The last dose was given 05/08/2011.  CURRENT THERAPY:  1. Maintenance Alimta at 500 mg/sq m given every 4 weeks. The patient is status post 8 cycles.  2. Zometa 4 mg IV given every month for bone metastasis.   INTERVAL HISTORY: Tamara Ortiz 71 y.o. female returns to the clinic today for followup visit. The patient is tolerating her treatment with single agent Alimta fairly well. Implants more back pain than usual. She continues to be very active with aerobic and line dancing classes. She reports that taking Aleve helps with her back pain. She reports that she had a sinus infection a couple weeks ago requiring a visit to her primary care physician and a course of antibiotics. She is completed the antibiotics and her symptoms have resolved. She's had some mild problems with constipation. She requests refills for her folic acid and her lorazepam. She has mild constipation that is well managed with MiraLax. She denied having any significant chest pain or shortness of breath. Denied bony pain. She has no weight loss or night sweats, no fever  or chills, no nausea or vomiting. She has in fact gained some weight.  MEDICAL HISTORY: Past Medical History  Diagnosis Date  . Hypertension   . HYPERLIPIDEMIA 11/05/2007  . ANXIETY 05/13/2009  . DEPRESSIVE DISORDER 05/06/2008  . HYPERTENSION 11/05/2007  . ALLERGIC RHINITIS 11/05/2007  . CONSTIPATION, CHRONIC 05/13/2009  . UTI 05/06/2008  . BACK PAIN 05/11/2008  . OSTEOPOROSIS 11/05/2007  . INSOMNIA-SLEEP DISORDER-UNSPEC 05/13/2009  . FATIGUE 05/13/2009  . Swelling, mass, or lump in chest 11/05/2007  . LUNG CANCER, HX OF 05/06/2008  . Cancer     lung ca    ALLERGIES:  is allergic to amoxicillin.  MEDICATIONS:  Current Outpatient Prescriptions  Medication Sig Dispense Refill  . aspirin 81 MG EC tablet Take 81 mg by mouth daily.        . Calcium Carbonate-Vitamin D (CALCIUM-VITAMIN D) 500-200 MG-UNIT per tablet Take 1 tablet by mouth 2 (two) times daily.        . Coenzyme Q10 (COQ10) 100 MG CAPS Take 100 mg by mouth daily.        . Flaxseed, Linseed, (FLAXSEED OIL) 1000 MG CAPS Take 1,000 mg by mouth daily.        . folic acid (FOLVITE) 1 MG tablet Take 1 tablet (1 mg total) by mouth daily.  90 tablet  1  . HYDROcodone-acetaminophen (NORCO) 10-325 MG per tablet       . LORazepam (ATIVAN) 0.5 MG tablet Take 1 tablet (0.5 mg total) by mouth every 8 (eight) hours  as needed for anxiety.  40 tablet  0  . losartan-hydrochlorothiazide (HYZAAR) 50-12.5 MG per tablet Take 1 tablet by mouth daily.  90 tablet  2  . Multiple Vitamin (ANTIOXIDANT A/C/E PO) Take by mouth daily.        . Omega-3 350 MG CAPS Take by mouth daily.        . polyethylene glycol powder (MIRALAX) powder Take 17 g by mouth daily.        . Probiotic Product (PROBIOTIC FORMULA) CAPS Take 1 capsule by mouth daily.        . Red Yeast Rice 600 MG CAPS Take 600 mg by mouth 2 (two) times daily.        . simvastatin (ZOCOR) 40 MG tablet Take 1 tablet (40 mg total) by mouth every evening.  90 tablet  2  . vitamin B-12 (CYANOCOBALAMIN) 100 MCG  tablet Take 50 mcg by mouth daily.        . vitamin E ([employer] VITAMIN E) 400 UNIT capsule Take 400 Units by mouth daily.        [name] zolendronic acid (ZOMETA) 4 MG/5ML injection Inject 4 mg into the vein once.          SURGICAL HISTORY:  Past Surgical History  Procedure Date  . Tubal ligation   . S/p lul lung surgury 12/2007  . Lobectomy     REVIEW OF SYSTEMS:  A comprehensive review of systems was negative except for: Gastrointestinal: positive for constipation Musculoskeletal: positive for back pain   PHYSICAL EXAMINATION: General appearance: alert, cooperative and no distress Resp: clear to auscultation bilaterally Cardio: regular rate and rhythm, S1, S2 normal, no murmur, click, rub or gallop GI: soft, non-tender; bowel sounds normal; no masses,  no organomegaly Extremities: extremities normal, atraumatic, no cyanosis or edema  ECOG PERFORMANCE STATUS: 0 - Asymptomatic  Blood pressure 118/74, pulse 53, temperature 97.5 F (36.4 C), temperature source Oral, height 5\' 5"  (1.651 m), weight 149 lb 12.8 oz (67.949 kg).  LABORATORY DATA: Lab Results  Component Value Date   WBC 4.2 01/02/2012   HGB 12.2 01/02/2012   HCT 36.1 01/02/2012   MCV 95.3 01/02/2012   PLT 237 01/02/2012      Chemistry      Component Value Date/Time   NA 138 12/04/2011 1114   K 4.1 12/04/2011 1114   CL 103 12/04/2011 1114   CO2 26 12/04/2011 1114   BUN 19 12/04/2011 1114   CREATININE 0.99 12/04/2011 1114      Component Value Date/Time   CALCIUM 10.1 12/04/2011 1114   ALKPHOS 90 11/06/2011 1021   AST 27 11/06/2011 1021   ALT 20 11/06/2011 1021   BILITOT 0.4 11/06/2011 1021       RADIOGRAPHIC STUDIES: Ct Chest W Contrast  11/02/2011  *RADIOLOGY REPORT*  Clinical Data:  Lung cancer metastatic to bone diagnosed 2009. Ongoing chemotherapy.  CT CHEST, ABDOMEN AND PELVIS WITH CONTRAST  Technique:  Multidetector CT imaging of the chest, abdomen and pelvis was performed following the standard protocol during bolus administration of  intravenous contrast.  Contrast: OMNIPAQUE IOHEXOL 300 MG/ML IV SOLN  Comparison:  08/09/2011  CT CHEST  Findings:  Small amount of fluid in the superior pericardial recess is stable.  No pleural effusion.  Moderate atherosclerotic coronary arterial calcification noted.  Great vessels are normal in caliber. No lymphadenopathy.  Small hiatal hernia again noted.  Curvilinear superior segment left lower lobe subpleural scarring is stable. Evidence of left upper lobectomy.  No mass at the resection site. No new pulmonary nodule, mass, or consolidation.  No intrabronchial filling defect.  IMPRESSION: No new abnormality within the thorax or evidence for intrathoracic local metastatic disease/recurrence.  Evidence of prior left upper lobectomy.  CT ABDOMEN AND PELVIS  Findings:  Liver, gallbladder, adrenal glands, spleen, pancreas, and kidneys are normal.  Scattered atherosclerotic calcification noted without aortic aneurysm.  Uterus and ovaries are normal.  Bladder is normal.  Colon and small bowel are normal in appearance.  The appendix is not identified but there is no secondary evidence for acute appendicitis.  No pelvic free fluid or lymphadenopathy.  Lumbar spine degenerative changes are noted.  Sclerotic lesions within the sacrum, iliac bone, and T6 vertebral body again noted. (The T6 lesion was previously reported as the T5 level, although there are four lumbar-type vertebral bodies, which could alter the numbering nomenclature. There is a stable sclerotic lesion in the right posteromedial seventh rib.  IMPRESSION: No new evidence for intra-abdominal or pelvic metastatic disease. Stable sclerotic osseous disease as above.  Original Report Authenticated By: Harrel Lemon, M.D.   Ct Abdomen Pelvis W Contrast  11/02/2011  *RADIOLOGY REPORT*  Clinical Data:  Lung cancer metastatic to bone diagnosed 2009. Ongoing chemotherapy.  CT CHEST, ABDOMEN AND PELVIS WITH CONTRAST  Technique:  Multidetector CT imaging  of the chest, abdomen and pelvis was performed following the standard protocol during bolus administration of intravenous contrast.  Contrast: OMNIPAQUE IOHEXOL 300 MG/ML IV SOLN  Comparison:  08/09/2011  CT CHEST  Findings:  Small amount of fluid in the superior pericardial recess is stable.  No pleural effusion.  Moderate atherosclerotic coronary arterial calcification noted.  Great vessels are normal in caliber. No lymphadenopathy.  Small hiatal hernia again noted.  Curvilinear superior segment left lower lobe subpleural scarring is stable. Evidence of left upper lobectomy.  No mass at the resection site. No new pulmonary nodule, mass, or consolidation.  No intrabronchial filling defect.  IMPRESSION: No new abnormality within the thorax or evidence for intrathoracic local metastatic disease/recurrence.  Evidence of prior left upper lobectomy.  CT ABDOMEN AND PELVIS  Findings:  Liver, gallbladder, adrenal glands, spleen, pancreas, and kidneys are normal.  Scattered atherosclerotic calcification noted without aortic aneurysm.  Uterus and ovaries are normal.  Bladder is normal.  Colon and small bowel are normal in appearance.  The appendix is not identified but there is no secondary evidence for acute appendicitis.  No pelvic free fluid or lymphadenopathy.  Lumbar spine degenerative changes are noted.  Sclerotic lesions within the sacrum, iliac bone, and T6 vertebral body again noted. (The T6 lesion was previously reported as the T5 level, although there are four lumbar-type vertebral bodies, which could alter the numbering nomenclature. There is a stable sclerotic lesion in the right posteromedial seventh rib.  IMPRESSION: No new evidence for intra-abdominal or pelvic metastatic disease. Stable sclerotic osseous disease as above.  Original Report Authenticated By: Harrel Lemon, M.D.   Mm Digital Screening  10/30/2011  DG SCREEN MAMMOGRAM BILATERAL Bilateral CC and MLO view(s) were taken. Technologist:  Ginger Bingman, RT RM  DIGITAL SCREENING MAMMOGRAM WITH CAD:  Comparison:  Prior studies.  The breast tissue is heterogeneously dense.  There is no dominant mass, architectural distortion or calcification to suggest malignancy.  Images were processed with CAD.  IMPRESSION:  No mammographic evidence of malignancy.  Suggest yearly screening mammography.  A result letter of this screening mammogram will be mailed directly to the patient.  ASSESSMENT: Negative - BI-RADS 1  Screening mammogram in 1 year. ,    ASSESSMENT/PLAN: This is a very pleasant 71 years old white female with metastatic non-small cell lung cancer, adenocarcinoma. She is currently on maintenance chemotherapy with single agent Alimta every month. The patient is doing fine and tolerating her treatment fairly well. She has no evidence for disease progression on her last scan. The patient was discussed with Dr. Arbutus Ped. She will continue on maintenance Alimta given on a monthly basis in addition to her monthly Zometa infusions.  She will also continue on calcium and vitamin D. a followup with Dr. Arbutus Ped in one month with a repeat CBC differential C. met and CT of the chest abdomen and pelvis with contrast to reevaluate her disease. All questions were answered. She was given a refill for her lorazepam 0.5 mg tablets one tablet by mouth every 8 hours as needed for nausea or anxiety a total 40 tablets with no refill. A prescription for folic acid 1 mg by mouth daily a total of 90 with 1 refill was sent via [facility] to her pharmacy of record.  Marland KitchenConni Slipper, PA-C   The patient knows to call the clinic with any problems, questions or concerns. We can certainly see the patient much sooner if necessary.

## 2012-01-10 ENCOUNTER — Telehealth: Payer: Self-pay

## 2012-01-10 ENCOUNTER — Telehealth: Payer: Self-pay | Admitting: Medical Oncology

## 2012-01-10 NOTE — Telephone Encounter (Signed)
Patient informed. 

## 2012-01-10 NOTE — Telephone Encounter (Signed)
unfort I  Believe medicare is her primary insurance and they will not pay for blood work done prior to the day of the visit

## 2012-01-10 NOTE — Telephone Encounter (Signed)
The patient has appointment on 01/17/2012 and would like labs ordered to check for cholesterol

## 2012-01-10 NOTE — Telephone Encounter (Addendum)
Pt reports it is taking her a lot longer to get over this last chemo. She reports her  stomach hurt much worse this time -described as grinding, knotty with nausea. She has no appetite , but thinks she is gaining weight. She had 2 days of diarrhea but it has resolved. She is drinking fluids and eating some . She wanted Dr Donnald Garre to be aware of this . I told her she may have contracted a stomach virus and to call us if symptoms worsen. She voices understanding. Dr Donnald Garre aware .

## 2012-01-16 ENCOUNTER — Telehealth: Payer: Self-pay | Admitting: Medical Oncology

## 2012-01-16 ENCOUNTER — Other Ambulatory Visit: Payer: Self-pay | Admitting: Medical Oncology

## 2012-01-16 NOTE — Telephone Encounter (Signed)
Asking if chemo is due 4/29 instead of 4/22. I will call her back.I consulted with pharmacy and pt is on every 4 week schedule . I sent a onc schedule request  to cancel chemo on 4/22 and schedule for 4/29. Pt notified

## 2012-01-17 ENCOUNTER — Other Ambulatory Visit (INDEPENDENT_AMBULATORY_CARE_PROVIDER_SITE_OTHER): Payer: Medicare Other

## 2012-01-17 ENCOUNTER — Telehealth: Payer: Self-pay | Admitting: Internal Medicine

## 2012-01-17 ENCOUNTER — Encounter: Payer: Self-pay | Admitting: Internal Medicine

## 2012-01-17 ENCOUNTER — Ambulatory Visit (INDEPENDENT_AMBULATORY_CARE_PROVIDER_SITE_OTHER): Payer: Medicare Other | Admitting: Internal Medicine

## 2012-01-17 VITALS — BP 96/62 | HR 85 | Temp 98.3°F | Ht 65.0 in | Wt 146.1 lb

## 2012-01-17 DIAGNOSIS — R197 Diarrhea, unspecified: Secondary | ICD-10-CM

## 2012-01-17 DIAGNOSIS — E785 Hyperlipidemia, unspecified: Secondary | ICD-10-CM

## 2012-01-17 DIAGNOSIS — M25569 Pain in unspecified knee: Secondary | ICD-10-CM

## 2012-01-17 DIAGNOSIS — M25562 Pain in left knee: Secondary | ICD-10-CM

## 2012-01-17 LAB — LIPID PANEL
Cholesterol: 212 mg/dL — ABNORMAL HIGH (ref 0–200)
Total CHOL/HDL Ratio: 3
Triglycerides: 221 mg/dL — ABNORMAL HIGH (ref 0.0–149.0)
VLDL: 44.2 mg/dL — ABNORMAL HIGH (ref 0.0–40.0)

## 2012-01-17 LAB — LDL CHOLESTEROL, DIRECT: Direct LDL: 102.8 mg/dL

## 2012-01-17 NOTE — Telephone Encounter (Signed)
Per 4/16 pof added inf to 4/29 appts. Per pof pt aware.

## 2012-01-17 NOTE — Patient Instructions (Signed)
Continue all other medications as before You should take daily Align OTC as well Please go to LAB in the Basement for the blood and/or urine tests to be done today You will be contacted by phone if any changes need to be made immediately.  Otherwise, you will receive a letter about your results with an explanation. Please call if your GI symptoms worsen Please return in 6 mo with Lab testing done 3-5 days before

## 2012-01-17 NOTE — Progress Notes (Signed)
Subjective:    Patient ID: Tamara Ortiz, female    DOB: 10-01-1941, 71 y.o.   MRN: [id number]  HPI  Here to f/u, last chemo episode apr 8, with c/o GI symptoms of 1-2 wks bloating, diarrhea wihtout abd pain, n/v, blood, wt loss, recent antibx or med change, or other otc med.  Not taking statin as she opted to try red yeast rice.  Also with left knee pain related to qod aerobics at the gym with mild pain at night and swelling.  Trying to follow lower chol diet.  Pt denies chest pain, increased sob or doe, wheezing, orthopnea, PND, increased LE swelling, palpitations, dizziness or syncope.  Pt denies new neurological symptoms such as new headache, or facial or extremity weakness or numbness  Wt overall stable at at 147 to 146 today since last visit.   Pt denies fever, wt loss, night sweats. Past Medical History  Diagnosis Date  . Hypertension   . HYPERLIPIDEMIA 11/05/2007  . ANXIETY 05/13/2009  . DEPRESSIVE DISORDER 05/06/2008  . HYPERTENSION 11/05/2007  . ALLERGIC RHINITIS 11/05/2007  . CONSTIPATION, CHRONIC 05/13/2009  . UTI 05/06/2008  . BACK PAIN 05/11/2008  . OSTEOPOROSIS 11/05/2007  . INSOMNIA-SLEEP DISORDER-UNSPEC 05/13/2009  . FATIGUE 05/13/2009  . Swelling, mass, or lump in chest 11/05/2007  . LUNG CANCER, HX OF 05/06/2008  . Cancer     lung ca   Past Surgical History  Procedure Date  . Tubal ligation   . S/p lul lung surgury 12/2007  . Lobectomy     reports that she has never smoked. She has never used smokeless tobacco. She reports that she drinks alcohol. She reports that she does not use illicit drugs. family history includes ALS in her father; Alcohol abuse in her son; and Heart disease in her mother. Allergies  Allergen Reactions  . Amoxicillin     REACTION: hives/itch   Current Outpatient Prescriptions on File Prior to Visit  Medication Sig Dispense Refill  . aspirin 81 MG EC tablet Take 81 mg by mouth daily.        . Calcium Carbonate-Vitamin D (CALCIUM-VITAMIN D) 500-200 MG-UNIT  per tablet Take 1 tablet by mouth 2 (two) times daily.        . Coenzyme Q10 (COQ10) 100 MG CAPS Take 100 mg by mouth daily.        . Flaxseed, Linseed, (FLAXSEED OIL) 1000 MG CAPS Take 1,000 mg by mouth daily.        . folic acid (FOLVITE) 1 MG tablet Take 1 tablet (1 mg total) by mouth daily.  90 tablet  1  . LORazepam (ATIVAN) 0.5 MG tablet Take 1 tablet (0.5 mg total) by mouth every 8 (eight) hours as needed for anxiety.  40 tablet  0  . losartan-hydrochlorothiazide (HYZAAR) 50-12.5 MG per tablet Take 1 tablet by mouth daily.  90 tablet  2  . Multiple Vitamin (ANTIOXIDANT A/C/E PO) Take by mouth daily.        . Omega-3 350 MG CAPS Take by mouth daily.        . polyethylene glycol powder (MIRALAX) powder Take 17 g by mouth daily.        . Probiotic Product (PROBIOTIC FORMULA) CAPS Take 1 capsule by mouth daily.        . Red Yeast Rice 600 MG CAPS Take 600 mg by mouth 2 (two) times daily.        . simvastatin (ZOCOR) 40 MG tablet Take 1 tablet (40 mg total)  by mouth every evening.  90 tablet  2  . vitamin B-12 (CYANOCOBALAMIN) 100 MCG tablet Take 50 mcg by mouth daily.        . vitamin E ([employer]S VITAMIN E) 400 UNIT capsule Take 400 Units by mouth daily.        [name] zolendronic acid (ZOMETA) 4 MG/5ML injection Inject 4 mg into the vein once.        [name] HYDROcodone-acetaminophen (NORCO) 10-325 MG per tablet        Review of Systems Review of Systems  Constitutional: Negative for diaphoresis and unexpected weight change.  HENT: Negative for drooling and tinnitus.   Eyes: Negative for photophobia and visual disturbance.  Respiratory: Negative for choking and stridor.   Gastrointestinal: Negative for vomiting and blood in stool.  Genitourinary: Negative for hematuria and decreased urine volume.  Musculoskeletal: Negative for gait problem.  Skin: Negative for color change and wound.  Neurological: Negative for tremors and numbness.  Psychiatric/Behavioral: Negative for decreased concentration. The  patient is not hyperactive.       Objective:   Physical Exam BP 96/62  Pulse 85  Temp(Src) 98.3 F (36.8 C) (Oral)  Ht 5\' 5"  (1.651 m)  Wt 146 lb 2 oz (66.282 kg)  BMI 24.32 kg/m2  SpO2 97% Physical Exam  VS noted Constitutional: Pt appears more nourished than previous  HENT: Head: Normocephalic.  Right Ear: External ear normal.  Left Ear: External ear normal.  Eyes: Conjunctivae and EOM are normal. Pupils are equal, round, and reactive to light.  Neck: Normal range of motion. Neck supple.  Cardiovascular: Normal rate and regular rhythm.   Pulmonary/Chest: Effort normal and breath sounds normal.  Abd:  Soft, NT, non-distended, + BS - benign exam Neurological: Pt is alert. Left knee FROM, NT, no effusion, small crepitus on ROM Skin: Skin is warm. No erythema.  Psychiatric: Pt behavior is normal. Thought content normal.     Assessment & Plan:

## 2012-01-18 ENCOUNTER — Encounter: Payer: Self-pay | Admitting: Internal Medicine

## 2012-01-19 ENCOUNTER — Telehealth: Payer: Self-pay | Admitting: *Deleted

## 2012-01-19 NOTE — Telephone Encounter (Signed)
Mailed out lab results letter for date 01/18/12.Marland KitchenMarland Kitchen4/19/13@8 :47am/LMB

## 2012-01-21 ENCOUNTER — Encounter: Payer: Self-pay | Admitting: Internal Medicine

## 2012-01-21 DIAGNOSIS — R197 Diarrhea, unspecified: Secondary | ICD-10-CM | POA: Insufficient documentation

## 2012-01-21 NOTE — Assessment & Plan Note (Signed)
Pt requests f/u lipids, not currently on statin,  Lab Results  Component Value Date   LDLCALC 108* 05/13/2009

## 2012-01-21 NOTE — Assessment & Plan Note (Signed)
Mild, worse to aerobics, overall tolerable, Continue all other medications as before, declines ortho at this time

## 2012-01-21 NOTE — Assessment & Plan Note (Signed)
Mild overall, benign exam, no fever, tendderness, blood, for otc probiotic, and immodium prn,  to f/u any worsening symptoms or concerns

## 2012-01-24 ENCOUNTER — Telehealth: Payer: Self-pay | Admitting: Medical Oncology

## 2012-01-24 NOTE — Telephone Encounter (Signed)
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 I told Kevyn that Dr Donnald Garre  does not recommend alcohol generally. I told her if she does drink wine to be sure she drinks extra water this weekend to stay hydrated prior to chemo on Monday. She voices understanding.

## 2012-01-25 ENCOUNTER — Other Ambulatory Visit: Payer: Self-pay | Admitting: Physician Assistant

## 2012-01-25 ENCOUNTER — Ambulatory Visit (HOSPITAL_COMMUNITY)
Admission: RE | Admit: 2012-01-25 | Discharge: 2012-01-25 | Disposition: A | Discharge status: Home / Self Care | Payer: Medicare Other | Source: Ambulatory Visit | Attending: Physician Assistant | Admitting: Physician Assistant

## 2012-01-25 ENCOUNTER — Ambulatory Visit (HOSPITAL_COMMUNITY): Payer: Medicare Other

## 2012-01-25 DIAGNOSIS — K449 Diaphragmatic hernia without obstruction or gangrene: Secondary | ICD-10-CM | POA: Insufficient documentation

## 2012-01-25 DIAGNOSIS — K7689 Other specified diseases of liver: Secondary | ICD-10-CM | POA: Insufficient documentation

## 2012-01-25 DIAGNOSIS — C3490 Malignant neoplasm of unspecified part of unspecified bronchus or lung: Secondary | ICD-10-CM

## 2012-01-25 DIAGNOSIS — I517 Cardiomegaly: Secondary | ICD-10-CM | POA: Insufficient documentation

## 2012-01-25 DIAGNOSIS — I2510 Atherosclerotic heart disease of native coronary artery without angina pectoris: Secondary | ICD-10-CM | POA: Insufficient documentation

## 2012-01-25 DIAGNOSIS — Z79899 Other long term (current) drug therapy: Secondary | ICD-10-CM | POA: Insufficient documentation

## 2012-01-25 DIAGNOSIS — C7951 Secondary malignant neoplasm of bone: Secondary | ICD-10-CM | POA: Insufficient documentation

## 2012-01-25 MED ORDER — IOHEXOL 300 MG/ML  SOLN
100.0000 mL | Freq: Once | INTRAMUSCULAR | Status: AC | PRN
  Administered 2012-01-25: 100 mL via INTRAVENOUS

## 2012-01-29 ENCOUNTER — Telehealth: Payer: Self-pay | Admitting: *Deleted

## 2012-01-29 ENCOUNTER — Ambulatory Visit: Payer: Medicare Other | Admitting: Internal Medicine

## 2012-01-29 ENCOUNTER — Ambulatory Visit (HOSPITAL_BASED_OUTPATIENT_CLINIC_OR_DEPARTMENT_OTHER): Payer: Medicare Other

## 2012-01-29 ENCOUNTER — Telehealth: Payer: Self-pay | Admitting: Internal Medicine

## 2012-01-29 ENCOUNTER — Other Ambulatory Visit: Payer: Medicare Other | Admitting: Lab

## 2012-01-29 ENCOUNTER — Other Ambulatory Visit: Payer: Self-pay | Admitting: Internal Medicine

## 2012-01-29 VITALS — BP 117/71 | HR 61 | Temp 97.8°F | Ht 65.0 in | Wt 145.8 lb

## 2012-01-29 DIAGNOSIS — Z5111 Encounter for antineoplastic chemotherapy: Secondary | ICD-10-CM

## 2012-01-29 DIAGNOSIS — C7951 Secondary malignant neoplasm of bone: Secondary | ICD-10-CM

## 2012-01-29 DIAGNOSIS — C3490 Malignant neoplasm of unspecified part of unspecified bronchus or lung: Secondary | ICD-10-CM

## 2012-01-29 LAB — COMPREHENSIVE METABOLIC PANEL
ALT: 18 U/L (ref 0–35)
Alkaline Phosphatase: 63 U/L (ref 39–117)
CO2: 30 mEq/L (ref 19–32)
Creatinine, Ser: 1.03 mg/dL (ref 0.50–1.10)
Sodium: 139 mEq/L (ref 135–145)
Total Bilirubin: 0.4 mg/dL (ref 0.3–1.2)
Total Protein: 6.8 g/dL (ref 6.0–8.3)

## 2012-01-29 LAB — CBC WITH DIFFERENTIAL/PLATELET
EOS%: 0.7 % (ref 0.0–7.0)
LYMPH%: 38.4 % (ref 14.0–49.7)
MCH: 32 pg (ref 25.1–34.0)
MCHC: 33.7 g/dL (ref 31.5–36.0)
MCV: 94.8 fL (ref 79.5–101.0)
MONO%: 10.5 % (ref 0.0–14.0)
Platelets: 248 10*3/uL (ref 145–400)
RBC: 3.88 10*6/uL (ref 3.70–5.45)
RDW: 12.8 % (ref 11.2–14.5)
nRBC: 0 % (ref 0–0)

## 2012-01-29 MED ORDER — SODIUM CHLORIDE 0.9 % IJ SOLN
10.0000 mL | INTRAMUSCULAR | Status: DC | PRN
  Filled 2012-01-29: qty 10

## 2012-01-29 MED ORDER — SODIUM CHLORIDE 0.9 % IV SOLN
500.0000 mg/m2 | Freq: Once | INTRAVENOUS | Status: AC
  Administered 2012-01-29: 875 mg via INTRAVENOUS
  Filled 2012-01-29: qty 35

## 2012-01-29 MED ORDER — ZOLEDRONIC ACID 4 MG/100ML IV SOLN
4.0000 mg | Freq: Once | INTRAVENOUS | Status: AC
  Administered 2012-01-29: 4 mg via INTRAVENOUS
  Filled 2012-01-29: qty 100

## 2012-01-29 MED ORDER — SODIUM CHLORIDE 0.9 % IV SOLN
Freq: Once | INTRAVENOUS | Status: AC
  Administered 2012-01-29: 13:00:00 via INTRAVENOUS

## 2012-01-29 MED ORDER — DEXAMETHASONE SODIUM PHOSPHATE 10 MG/ML IJ SOLN
10.0000 mg | Freq: Once | INTRAMUSCULAR | Status: AC
  Administered 2012-01-29: 10 mg via INTRAVENOUS

## 2012-01-29 MED ORDER — ONDANSETRON 8 MG/50ML IVPB (CHCC)
8.0000 mg | Freq: Once | INTRAVENOUS | Status: AC
  Administered 2012-01-29: 8 mg via INTRAVENOUS

## 2012-01-29 NOTE — Progress Notes (Signed)
[facility] [phone]   [phone]  OFFICE PROGRESS NOTE  [name], MD, MD [address] [facility] [address] 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   PRIOR THERAPY:  1. Status post left upper lobectomy with mediastinal lymph node dissection under the care of Dr. Dorris Fetch on December 10, 2007.  2. Status post 4 cycles of adjuvant chemotherapy with cisplatin and Taxotere. Last dose was given 03/22/2008.  3. Status post 6 cycles of systemic chemotherapy with carboplatin and Alimta for metastatic disease in the bone. The last dose was given July 02, 2010, with stable disease.  4. Status post 12 cycles of maintenance Alimta at 500 mg/sq m given every 3 weeks. The last dose was given 05/08/2011.  CURRENT THERAPY:  1. Maintenance Alimta at 500 mg/sq m given every 4 weeks. The patient is status post 9 cycles.  2. Zometa 4 mg IV given every month for bone metastasis.   INTERVAL HISTORY: Tamara Ortiz 71 y.o. female returns to the clinic today for followup visit. The patient is tolerating her treatment with maintenance Alimta fairly well with no significant complaints. She denied having any significant chest pain or shortness of breath. She has no bony aches. No nausea or vomiting, no weight loss or night sweats. She has repeat CT scan of the chest, abdomen and pelvis performed recently and she is here today for evaluation and discussion of her scan results.  MEDICAL HISTORY: Past Medical History  Diagnosis Date  . Hypertension   . HYPERLIPIDEMIA 11/05/2007  . ANXIETY 05/13/2009  . DEPRESSIVE DISORDER 05/06/2008  . HYPERTENSION 11/05/2007  . ALLERGIC RHINITIS 11/05/2007  . CONSTIPATION, CHRONIC 05/13/2009  . UTI 05/06/2008  . BACK PAIN 05/11/2008  .  OSTEOPOROSIS 11/05/2007  . INSOMNIA-SLEEP DISORDER-UNSPEC 05/13/2009  . FATIGUE 05/13/2009  . Swelling, mass, or lump in chest 11/05/2007  . LUNG CANCER, HX OF 05/06/2008  . Cancer     lung ca    ALLERGIES:  is allergic to amoxicillin.  MEDICATIONS:  Current Outpatient Prescriptions  Medication Sig Dispense Refill  . aspirin 81 MG EC tablet Take 81 mg by mouth daily.        . Calcium Carbonate-Vitamin D (CALCIUM-VITAMIN D) 500-200 MG-UNIT per tablet Take 1 tablet by mouth 2 (two) times daily.        . Coenzyme Q10 (COQ10) 100 MG CAPS Take 100 mg by mouth daily.        . Flaxseed, Linseed, (FLAXSEED OIL) 1000 MG CAPS Take 1,000 mg by mouth daily.        . folic acid (FOLVITE) 1 MG tablet Take 1 tablet (1 mg total) by mouth daily.  90 tablet  1  . LORazepam (ATIVAN) 0.5 MG tablet Take 1 tablet (0.5 mg total) by mouth every 8 (eight) hours as needed for anxiety.  40 tablet  0  . losartan-hydrochlorothiazide (HYZAAR) 50-12.5 MG per tablet Take 1 tablet by mouth daily.  90 tablet  2  . Multiple Vitamin (ANTIOXIDANT A/C/E PO) Take by mouth daily.        . Omega-3 350 MG CAPS Take by mouth daily.        . polyethylene glycol powder (MIRALAX) powder Take 17 g by mouth daily.        [name]  Probiotic Product (PROBIOTIC FORMULA) CAPS Take 1 capsule by mouth daily.        . Red Yeast Rice 600 MG CAPS Take 600 mg by mouth 2 (two) times daily.        . vitamin B-12 (CYANOCOBALAMIN) 100 MCG tablet Take 50 mcg by mouth daily.        . vitamin E ([employer]S VITAMIN E) 400 UNIT capsule Take 400 Units by mouth daily.        [name] zolendronic acid (ZOMETA) 4 MG/5ML injection Inject 4 mg into the vein once.        [name] HYDROcodone-acetaminophen (NORCO) 10-325 MG per tablet       . simvastatin (ZOCOR) 40 MG tablet Take 1 tablet (40 mg total) by mouth every evening.  90 tablet  2    SURGICAL HISTORY:  Past Surgical History  Procedure Date  . Tubal ligation   . S/p lul lung surgury 12/2007  . Lobectomy     REVIEW OF SYSTEMS:   A comprehensive review of systems was negative.   PHYSICAL EXAMINATION: General appearance: alert, cooperative and no distress Head: Normocephalic, without obvious abnormality, atraumatic Neck: no adenopathy Lymph nodes: Cervical, supraclavicular, and axillary nodes normal. Resp: clear to auscultation bilaterally Cardio: regular rate and rhythm, S1, S2 normal, no murmur, click, rub or gallop GI: soft, non-tender; bowel sounds normal; no masses,  no organomegaly Extremities: extremities normal, atraumatic, no cyanosis or edema Neurologic: Alert and oriented X 3, normal strength and tone. Normal symmetric reflexes. Normal coordination and gait  ECOG PERFORMANCE STATUS: 0 - Asymptomatic  Blood pressure 117/71, pulse 61, temperature 97.8 F (36.6 C), temperature source Oral, height 5\' 5"  (1.651 m), weight 145 lb 12.8 oz (66.134 kg).  LABORATORY DATA: Lab Results  Component Value Date   WBC 4.2 01/29/2012   HGB 12.4 01/29/2012   HCT 36.8 01/29/2012   MCV 94.8 01/29/2012   PLT 248 01/29/2012      Chemistry      Component Value Date/Time   NA 139 01/02/2012 0957   K 4.2 01/02/2012 0957   CL 102 01/02/2012 0957   CO2 31 01/02/2012 0957   BUN 16 01/02/2012 0957   CREATININE 1.05 01/02/2012 0957      Component Value Date/Time   CALCIUM 10.6* 01/02/2012 0957   ALKPHOS 59 01/02/2012 0957   AST 24 01/02/2012 0957   ALT 20 01/02/2012 0957   BILITOT 0.4 01/02/2012 0957       RADIOGRAPHIC STUDIES: Ct Head W Wo Contrast  01/25/2012  *RADIOLOGY REPORT*  Clinical Data: Lung cancer.  Rule out metastatic disease.  CT HEAD WITHOUT AND WITH CONTRAST  Technique:  Contiguous axial images were obtained from the base of the skull through the vertex without and with intravenous contrast.  Contrast: OMNIPAQUE IOHEXOL 300 MG/ML  SOLN  Comparison: CT head 08/09/2011  Findings: Ventricle size is normal.  Mild hypodensity in the periventricular white matter, most compatible with chronic microvascular ischemia.  No acute  infarct or hemorrhage.  Negative for mass lesion.  No enhancing lesions are identified. Negative for metastatic disease.  Calvarium is intact. Air-fluid levels in the sphenoid sinus compatible with sinusitis.  IMPRESSION: No acute abnormality and negative for metastatic disease.  Original Report Authenticated By: Camelia Phenes, M.D.   Ct Abdomen Pelvis W Contrast  01/25/2012  *RADIOLOGY REPORT*  Clinical Data: Lung cancer.  Restaging.  Ongoing chemotherapy. Metastatic adenocarcinoma of left lung.  CT ABDOMEN AND PELVIS WITH CONTRAST  Technique:  Multidetector CT imaging of the abdomen and pelvis was performed following the standard protocol during bolus administration of intravenous contrast.  Contrast: OMNIPAQUE IOHEXOL 300 MG/ML  SOLN  Comparison: 11/02/2011 (  Findings: Mild scarring/fissural thickening at the left lung base, unchanged.  Mild cardiomegaly, without pericardial or pleural effusion. Coronary artery atherosclerosis. A small hiatal hernia.  A subtle hypoattenuating anterior liver lesion measures 4 mm on image 21. Not readily apparent on prior axial imaging.  Seen today on coronal image 12.  Possibly present on coronal image 18 of the prior exam.  Normal distal stomach, pancreas, gallbladder, biliary tract, adrenal glands, kidneys.  Small retroperitoneal nodes are unchanged, and not pathologic by size criteria.  No retrocrural adenopathy. Normal colon, appendix, and terminal ileum.  The appendix is positioned within the pelvis on image 71. Normal small bowel without abdominal ascites.    No evidence of omental or peritoneal disease.  No pelvic adenopathy.    Normal urinary bladder and uterus.  No adnexal mass. No significant free fluid.  Sclerosis within the left ilium measures 1.1 cm on image 62 versus 1.5 cm on the prior. The right sacral sclerotic lesion measures 1.0 cm today and is unchanged.  IMPRESSION:  1.  Stable to slight improvement in sclerotic osseous metastasis. 2. Subtle too  small to characterize hepatic dome lesion.  Possibly present on the prior exam.  Not readily apparent on more remote coronal reformatted images.  Consider short-term follow-up with abdominal CT (i.e. 3 months).  Given small lesion size, MRI may be of little additional value. 3.  Small hiatal hernia.  Original Report Authenticated By: Consuello Bossier, M.D.    ASSESSMENT: This is a very pleasant 71 years old white female with metastatic non-small cell lung cancer currently on maintenance chemotherapy with Alimta every 4 weeks. The patient is tolerating her treatment fairly well with no significant complaints and she has no evidence for disease progression on his recent scan.   PLAN: I discussed the scan results with the patient. I recommended for her to continue her current treatment with Alimta at the same dose and schedule.  I would see her back for followup visit in 4 weeks with the start of the next cycle of her chemotherapy. The patient will continue also on treatment with monthly Zometa for her bone disease. She was advised to call me immediately if she has any concerning symptoms in the interval.  All questions were answered. The patient knows to call the clinic with any problems, questions or concerns. We can certainly see the patient much sooner if necessary.  I spent 20 minutes counseling the patient face to face. The total time spent in the appointment was 25 minutes.

## 2012-01-29 NOTE — Telephone Encounter (Signed)
Pt will get appt calendar for May to July 2013 @ chemo room with Marcelino Duster

## 2012-01-29 NOTE — Telephone Encounter (Signed)
Per staff message from [place], patient scheduled for treatments. Appts in computer. Rose aware. [name]

## 2012-01-29 NOTE — Patient Instructions (Signed)
Instructed to call if fever, rash, diarrhea, constipation or fever > 100.5.  Discharged ambulatory alone to home.

## 2012-01-30 ENCOUNTER — Other Ambulatory Visit: Payer: Self-pay | Admitting: Internal Medicine

## 2012-01-30 DIAGNOSIS — C3490 Malignant neoplasm of unspecified part of unspecified bronchus or lung: Secondary | ICD-10-CM

## 2012-01-30 NOTE — Telephone Encounter (Signed)
THIS MEDICATION REFILL WAS COMPLETED ON 01/29/12 BY STEPHANINE JOHNSON,RN. DISREGARD REFILL OF 01/30/12

## 2012-01-31 ENCOUNTER — Telehealth: Payer: Self-pay | Admitting: Internal Medicine

## 2012-01-31 NOTE — Telephone Encounter (Signed)
Talked to pt, gave her appt date for May 28th lab,ML and chemo

## 2012-02-19 ENCOUNTER — Other Ambulatory Visit: Payer: Medicare Other | Admitting: Lab

## 2012-02-19 ENCOUNTER — Ambulatory Visit: Payer: Medicare Other | Admitting: Physician Assistant

## 2012-02-27 ENCOUNTER — Encounter: Payer: Self-pay | Admitting: Physician Assistant

## 2012-02-27 ENCOUNTER — Ambulatory Visit (HOSPITAL_BASED_OUTPATIENT_CLINIC_OR_DEPARTMENT_OTHER): Payer: Medicare Other

## 2012-02-27 ENCOUNTER — Other Ambulatory Visit (HOSPITAL_BASED_OUTPATIENT_CLINIC_OR_DEPARTMENT_OTHER): Payer: Medicare Other | Admitting: Lab

## 2012-02-27 ENCOUNTER — Ambulatory Visit (HOSPITAL_BASED_OUTPATIENT_CLINIC_OR_DEPARTMENT_OTHER): Payer: Medicare Other | Admitting: Physician Assistant

## 2012-02-27 ENCOUNTER — Telehealth: Payer: Self-pay | Admitting: Internal Medicine

## 2012-02-27 VITALS — BP 119/71 | HR 55 | Temp 97.8°F | Ht 65.0 in | Wt 145.4 lb

## 2012-02-27 DIAGNOSIS — Z5111 Encounter for antineoplastic chemotherapy: Secondary | ICD-10-CM

## 2012-02-27 DIAGNOSIS — C3490 Malignant neoplasm of unspecified part of unspecified bronchus or lung: Secondary | ICD-10-CM

## 2012-02-27 DIAGNOSIS — D649 Anemia, unspecified: Secondary | ICD-10-CM

## 2012-02-27 DIAGNOSIS — C3410 Malignant neoplasm of upper lobe, unspecified bronchus or lung: Secondary | ICD-10-CM

## 2012-02-27 DIAGNOSIS — C7951 Secondary malignant neoplasm of bone: Secondary | ICD-10-CM

## 2012-02-27 DIAGNOSIS — E538 Deficiency of other specified B group vitamins: Secondary | ICD-10-CM

## 2012-02-27 LAB — CBC WITH DIFFERENTIAL/PLATELET
Eosinophils Absolute: 0.1 10*3/uL (ref 0.0–0.5)
HCT: 33.8 % — ABNORMAL LOW (ref 34.8–46.6)
LYMPH%: 43.3 % (ref 14.0–49.7)
MCV: 94.4 fL (ref 79.5–101.0)
MONO#: 0.4 10*3/uL (ref 0.1–0.9)
MONO%: 10.2 % (ref 0.0–14.0)
NEUT#: 1.6 10*3/uL (ref 1.5–6.5)
NEUT%: 43.9 % (ref 38.4–76.8)
Platelets: 221 10*3/uL (ref 145–400)
RBC: 3.58 10*6/uL — ABNORMAL LOW (ref 3.70–5.45)
WBC: 3.7 10*3/uL — ABNORMAL LOW (ref 3.9–10.3)

## 2012-02-27 LAB — COMPREHENSIVE METABOLIC PANEL
BUN: 12 mg/dL (ref 6–23)
CO2: 29 mEq/L (ref 19–32)
Calcium: 9.9 mg/dL (ref 8.4–10.5)
Chloride: 103 mEq/L (ref 96–112)
Creatinine, Ser: 1.15 mg/dL — ABNORMAL HIGH (ref 0.50–1.10)
Glucose, Bld: 117 mg/dL — ABNORMAL HIGH (ref 70–99)
Total Bilirubin: 0.5 mg/dL (ref 0.3–1.2)

## 2012-02-27 MED ORDER — ZOLEDRONIC ACID 4 MG/100ML IV SOLN
4.0000 mg | Freq: Once | INTRAVENOUS | Status: AC
  Administered 2012-02-27: 4 mg via INTRAVENOUS
  Filled 2012-02-27: qty 100

## 2012-02-27 MED ORDER — SODIUM CHLORIDE 0.9 % IV SOLN
500.0000 mg/m2 | Freq: Once | INTRAVENOUS | Status: AC
  Administered 2012-02-27: 875 mg via INTRAVENOUS
  Filled 2012-02-27: qty 35

## 2012-02-27 MED ORDER — ZOLEDRONIC ACID 4 MG/100ML IV SOLN
4.0000 mg | Freq: Once | INTRAVENOUS | Status: DC
  Filled 2012-02-27: qty 100

## 2012-02-27 MED ORDER — SODIUM CHLORIDE 0.9 % IV SOLN: Freq: Once | INTRAVENOUS | Status: DC

## 2012-02-27 MED ORDER — DEXAMETHASONE SODIUM PHOSPHATE 10 MG/ML IJ SOLN
10.0000 mg | Freq: Once | INTRAMUSCULAR | Status: AC
  Administered 2012-02-27: 10 mg via INTRAVENOUS

## 2012-02-27 MED ORDER — CYANOCOBALAMIN 1000 MCG/ML IJ SOLN
1000.0000 ug | Freq: Once | INTRAMUSCULAR | Status: AC
  Administered 2012-02-27: 1000 ug via INTRAMUSCULAR

## 2012-02-27 MED ORDER — LORAZEPAM 0.5 MG PO TABS: 0.5000 mg | ORAL_TABLET | Freq: Three times a day (TID) | ORAL | Status: DC | PRN

## 2012-02-27 MED ORDER — ONDANSETRON 8 MG/50ML IVPB (CHCC)
8.0000 mg | Freq: Once | INTRAVENOUS | Status: AC
  Administered 2012-02-27: 8 mg via INTRAVENOUS

## 2012-02-27 NOTE — Progress Notes (Signed)
[facility] [phone]   Fax:([phone]  OFFICE PROGRESS NOTE  [name], MD, MD [address] 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   PRIOR THERAPY:  1. Status post left upper lobectomy with mediastinal lymph node dissection under the care of Dr. Dorris Fetch on December 10, 2007.  2. Status post 4 cycles of adjuvant chemotherapy with cisplatin and Taxotere. Last dose was given 03/22/2008.  3. Status post 6 cycles of systemic chemotherapy with carboplatin and Alimta for metastatic disease in the bone. The last dose was given July 02, 2010, with stable disease.  4. Status post 12 cycles of maintenance Alimta at 500 mg/sq m given every 3 weeks. The last dose was given 05/08/2011.  CURRENT THERAPY:  1. Maintenance Alimta at 500 mg/sq m given every 4 weeks. The patient is status post 10 cycles.  2. Zometa 4 mg IV given every month for bone metastasis.   INTERVAL HISTORY: Tamara Ortiz 71 y.o. female returns to the clinic today for followup visit. The patient is tolerating her treatment with maintenance Alimta fairly well with no significant complaints. She's been very reactive lately. She states she's had 2 trips to the beach as well as a lot of dancing. She's organize a monthly seniors dance.. The first one was well attended and well received. She was able to dance 4 solids and a row before becoming fatigued. She requests a refill for her lorazepam. She denied having any significant chest pain or shortness of breath. She has no bony aches. No nausea or vomiting, no weight loss or night sweats.   MEDICAL HISTORY: Past Medical History  Diagnosis Date  . Hypertension   . HYPERLIPIDEMIA 11/05/2007  . ANXIETY 05/13/2009  . DEPRESSIVE  DISORDER 05/06/2008  . HYPERTENSION 11/05/2007  . ALLERGIC RHINITIS 11/05/2007  . CONSTIPATION, CHRONIC 05/13/2009  . UTI 05/06/2008  . BACK PAIN 05/11/2008  . OSTEOPOROSIS 11/05/2007  . INSOMNIA-SLEEP DISORDER-UNSPEC 05/13/2009  . FATIGUE 05/13/2009  . Swelling, mass, or lump in chest 11/05/2007  . LUNG CANCER, HX OF 05/06/2008  . Cancer     lung ca    ALLERGIES:  is allergic to amoxicillin.  MEDICATIONS:  Current Outpatient Prescriptions  Medication Sig Dispense Refill  . aspirin 81 MG EC tablet Take 81 mg by mouth daily.        . Calcium Carbonate-Vitamin D (CALCIUM-VITAMIN D) 500-200 MG-UNIT per tablet Take 1 tablet by mouth 2 (two) times daily.        . Coenzyme Q10 (COQ10) 100 MG CAPS Take 100 mg by mouth daily.        . Flaxseed, Linseed, (FLAXSEED OIL) 1000 MG CAPS Take 1,000 mg by mouth daily.        . folic acid (FOLVITE) 1 MG tablet Take 1 tablet (1 mg total) by mouth daily.  90 tablet  1  . HYDROcodone-acetaminophen (NORCO) 10-325 MG per tablet       . LORazepam (ATIVAN) 0.5 MG tablet Take 1 tablet (0.5 mg total) by mouth every 8 (eight) hours as needed for anxiety.  40 tablet  0  . losartan-hydrochlorothiazide (HYZAAR) 50-12.5 MG per tablet Take 1 tablet by mouth daily.  90 tablet  2  . Multiple Vitamin (ANTIOXIDANT A/C/E PO) Take  by mouth daily.        . Omega-3 350 MG CAPS Take by mouth daily.        . polyethylene glycol powder (MIRALAX) powder Take 17 g by mouth daily.        . Probiotic Product (PROBIOTIC FORMULA) CAPS Take 1 capsule by mouth daily.        . Red Yeast Rice 600 MG CAPS Take 600 mg by mouth 2 (two) times daily.        . simvastatin (ZOCOR) 40 MG tablet Take 1 tablet (40 mg total) by mouth every evening.  90 tablet  2  . vitamin B-12 (CYANOCOBALAMIN) 100 MCG tablet Take 50 mcg by mouth daily.        . vitamin E (CVS VITAMIN E) 400 UNIT capsule Take 400 Units by mouth daily.        [name] zolendronic acid (ZOMETA) 4 MG/5ML injection Inject 4 mg into the vein once.           SURGICAL HISTORY:  Past Surgical History  Procedure Date  . Tubal ligation   . S/p lul lung surgury 12/2007  . Lobectomy     REVIEW OF SYSTEMS:  A comprehensive review of systems was negative.   PHYSICAL EXAMINATION: General appearance: alert, cooperative and no distress Head: Normocephalic, without obvious abnormality, atraumatic Neck: no adenopathy Lymph nodes: Cervical, supraclavicular, and axillary nodes normal. Resp: clear to auscultation bilaterally Cardio: regular rate and rhythm, S1, S2 normal, no murmur, click, rub or gallop GI: soft, non-tender; bowel sounds normal; no masses,  no organomegaly Extremities: extremities normal, atraumatic, no cyanosis or edema Neurologic: Alert and oriented X 3, normal strength and tone. Normal symmetric reflexes. Normal coordination and gait  ECOG PERFORMANCE STATUS: 0 - Asymptomatic  Blood pressure 119/71, pulse 55, temperature 97.8 F (36.6 C), temperature source Oral, height 5\' 5"  (1.651 m), weight 145 lb 6.4 oz (65.953 kg).  LABORATORY DATA: Lab Results  Component Value Date   WBC 3.7* 02/27/2012   HGB 11.5* 02/27/2012   HCT 33.8* 02/27/2012   MCV 94.4 02/27/2012   PLT 221 02/27/2012      Chemistry      Component Value Date/Time   NA 139 01/29/2012 1056   K 4.0 01/29/2012 1056   CL 100 01/29/2012 1056   CO2 30 01/29/2012 1056   BUN 21 01/29/2012 1056   CREATININE 1.03 01/29/2012 1056      Component Value Date/Time   CALCIUM 10.5 01/29/2012 1056   ALKPHOS 63 01/29/2012 1056   AST 24 01/29/2012 1056   ALT 18 01/29/2012 1056   BILITOT 0.4 01/29/2012 1056       RADIOGRAPHIC STUDIES: Ct Head W Wo Contrast  01/25/2012  *RADIOLOGY REPORT*  Clinical Data: Lung cancer.  Rule out metastatic disease.  CT HEAD WITHOUT AND WITH CONTRAST  Technique:  Contiguous axial images were obtained from the base of the skull through the vertex without and with intravenous contrast.  Contrast: OMNIPAQUE IOHEXOL 300 MG/ML  SOLN  Comparison: CT  head 08/09/2011  Findings: Ventricle size is normal.  Mild hypodensity in the periventricular white matter, most compatible with chronic microvascular ischemia.  No acute infarct or hemorrhage.  Negative for mass lesion.  No enhancing lesions are identified. Negative for metastatic disease.  Calvarium is intact. Air-fluid levels in the sphenoid sinus compatible with sinusitis.  IMPRESSION: No acute abnormality and negative for metastatic disease.  Original Report Authenticated By: Camelia Phenes, M.D.   Ct  Abdomen Pelvis W Contrast  01/25/2012  *RADIOLOGY REPORT*  Clinical Data: Lung cancer.  Restaging.  Ongoing chemotherapy. Metastatic adenocarcinoma of left lung.  CT ABDOMEN AND PELVIS WITH CONTRAST  Technique:  Multidetector CT imaging of the abdomen and pelvis was performed following the standard protocol during bolus administration of intravenous contrast.  Contrast: OMNIPAQUE IOHEXOL 300 MG/ML  SOLN  Comparison: 11/02/2011 (  Findings: Mild scarring/fissural thickening at the left lung base, unchanged.  Mild cardiomegaly, without pericardial or pleural effusion. Coronary artery atherosclerosis. A small hiatal hernia.  A subtle hypoattenuating anterior liver lesion measures 4 mm on image 21. Not readily apparent on prior axial imaging.  Seen today on coronal image 12.  Possibly present on coronal image 18 of the prior exam.  Normal distal stomach, pancreas, gallbladder, biliary tract, adrenal glands, kidneys.  Small retroperitoneal nodes are unchanged, and not pathologic by size criteria.  No retrocrural adenopathy. Normal colon, appendix, and terminal ileum.  The appendix is positioned within the pelvis on image 71. Normal small bowel without abdominal ascites.    No evidence of omental or peritoneal disease.  No pelvic adenopathy.    Normal urinary bladder and uterus.  No adnexal mass. No significant free fluid.  Sclerosis within the left ilium measures 1.1 cm on image 62 versus 1.5 cm on the prior.  The right sacral sclerotic lesion measures 1.0 cm today and is unchanged.  IMPRESSION:  1.  Stable to slight improvement in sclerotic osseous metastasis. 2. Subtle too small to characterize hepatic dome lesion.  Possibly present on the prior exam.  Not readily apparent on more remote coronal reformatted images.  Consider short-term follow-up with abdominal CT (i.e. 3 months).  Given small lesion size, MRI may be of little additional value. 3.  Small hiatal hernia.  Original Report Authenticated By: Consuello Bossier, M.D.    ASSESSMENT/PLAN: This is a very pleasant 71 years old white female with metastatic non-small cell lung cancer currently on maintenance chemotherapy with Alimta every 4 weeks. The patient is tolerating her treatment fairly well with no significant complaints and she has no evidence for disease progression on her most recent scan. Patient was discussed with Dr. Arbutus Ped. She will continue on her maintenance chemotherapy with single agent Alimta given every 4 weeks at 500 mg per meter squared. She will also continue on monthly Zometa 4 mg IV for bone metastasis. She'll return in one month with a repeat CBC differential and C. met prior to her next scheduled cycle of maintenance Alimta and IV Zometa.  Laural Benes, Aryani Daffern E, PA-C   All questions were answered. The patient knows to call the clinic with any problems, questions or concerns. We can certainly see the patient much sooner if necessary.  I spent 20 minutes counseling the patient face to face. The total time spent in the appointment was 30 minutes.

## 2012-02-27 NOTE — Telephone Encounter (Signed)
Gave pt appt for June 2013 lab MD and chemo and for July 2013 lab and chemo

## 2012-03-25 ENCOUNTER — Telehealth: Payer: Self-pay | Admitting: Medical Oncology

## 2012-03-25 ENCOUNTER — Ambulatory Visit: Payer: Medicare Other | Admitting: Physician Assistant

## 2012-03-25 ENCOUNTER — Ambulatory Visit (HOSPITAL_BASED_OUTPATIENT_CLINIC_OR_DEPARTMENT_OTHER): Payer: Medicare Other | Admitting: Physician Assistant

## 2012-03-25 ENCOUNTER — Other Ambulatory Visit (HOSPITAL_BASED_OUTPATIENT_CLINIC_OR_DEPARTMENT_OTHER): Payer: Medicare Other | Admitting: Lab

## 2012-03-25 ENCOUNTER — Ambulatory Visit (HOSPITAL_BASED_OUTPATIENT_CLINIC_OR_DEPARTMENT_OTHER): Payer: Medicare Other

## 2012-03-25 ENCOUNTER — Encounter: Payer: Self-pay | Admitting: Physician Assistant

## 2012-03-25 VITALS — BP 105/63 | HR 63 | Temp 98.7°F | Ht 65.0 in | Wt 145.9 lb

## 2012-03-25 DIAGNOSIS — C3490 Malignant neoplasm of unspecified part of unspecified bronchus or lung: Secondary | ICD-10-CM

## 2012-03-25 DIAGNOSIS — C7952 Secondary malignant neoplasm of bone marrow: Secondary | ICD-10-CM

## 2012-03-25 DIAGNOSIS — C3410 Malignant neoplasm of upper lobe, unspecified bronchus or lung: Secondary | ICD-10-CM

## 2012-03-25 DIAGNOSIS — Z5111 Encounter for antineoplastic chemotherapy: Secondary | ICD-10-CM

## 2012-03-25 DIAGNOSIS — C7951 Secondary malignant neoplasm of bone: Secondary | ICD-10-CM

## 2012-03-25 LAB — CBC WITH DIFFERENTIAL/PLATELET
BASO%: 0.7 % (ref 0.0–2.0)
Basophils Absolute: 0 10*3/uL (ref 0.0–0.1)
Eosinophils Absolute: 0 10*3/uL (ref 0.0–0.5)
HCT: 33.1 % — ABNORMAL LOW (ref 34.8–46.6)
HGB: 11.3 g/dL — ABNORMAL LOW (ref 11.6–15.9)
LYMPH%: 33.3 % (ref 14.0–49.7)
MCHC: 34.1 g/dL (ref 31.5–36.0)
MONO#: 0.4 10*3/uL (ref 0.1–0.9)
NEUT#: 2.3 10*3/uL (ref 1.5–6.5)
NEUT%: 55.5 % (ref 38.4–76.8)
Platelets: 235 10*3/uL (ref 145–400)
WBC: 4.1 10*3/uL (ref 3.9–10.3)
lymph#: 1.4 10*3/uL (ref 0.9–3.3)

## 2012-03-25 LAB — COMPREHENSIVE METABOLIC PANEL
ALT: 21 U/L (ref 0–35)
CO2: 28 mEq/L (ref 19–32)
Calcium: 9.6 mg/dL (ref 8.4–10.5)
Chloride: 99 mEq/L (ref 96–112)
Creatinine, Ser: 1.12 mg/dL — ABNORMAL HIGH (ref 0.50–1.10)
Glucose, Bld: 97 mg/dL (ref 70–99)

## 2012-03-25 MED ORDER — LORAZEPAM 0.5 MG PO TABS: 0.5000 mg | ORAL_TABLET | Freq: Three times a day (TID) | ORAL | Status: DC | PRN

## 2012-03-25 MED ORDER — ONDANSETRON 8 MG/50ML IVPB (CHCC)
8.0000 mg | Freq: Once | INTRAVENOUS | Status: AC
  Administered 2012-03-25: 8 mg via INTRAVENOUS

## 2012-03-25 MED ORDER — SODIUM CHLORIDE 0.9 % IV SOLN
500.0000 mg/m2 | Freq: Once | INTRAVENOUS | Status: AC
  Administered 2012-03-25: 875 mg via INTRAVENOUS
  Filled 2012-03-25: qty 35

## 2012-03-25 MED ORDER — DEXAMETHASONE SODIUM PHOSPHATE 10 MG/ML IJ SOLN
10.0000 mg | Freq: Once | INTRAMUSCULAR | Status: AC
  Administered 2012-03-25: 10 mg via INTRAVENOUS

## 2012-03-25 MED ORDER — ZOLEDRONIC ACID 4 MG/100ML IV SOLN
4.0000 mg | Freq: Once | INTRAVENOUS | Status: AC
  Administered 2012-03-25: 4 mg via INTRAVENOUS
  Filled 2012-03-25: qty 100

## 2012-03-25 MED ORDER — SODIUM CHLORIDE 0.9 % IV SOLN
Freq: Once | INTRAVENOUS | Status: AC
  Administered 2012-03-25: 13:00:00 via INTRAVENOUS

## 2012-03-25 NOTE — Progress Notes (Signed)
[facility] [phone]   [phone]  OFFICE PROGRESS NOTE  [name], MD [address] 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   PRIOR THERAPY:  1. Status post left upper lobectomy with mediastinal lymph node dissection under the care of Dr. Dorris Fetch on December 10, 2007.  2. Status post 4 cycles of adjuvant chemotherapy with cisplatin and Taxotere. Last dose was given 03/22/2008.  3. Status post 6 cycles of systemic chemotherapy with carboplatin and Alimta for metastatic disease in the bone. The last dose was given July 02, 2010, with stable disease.  4. Status post 12 cycles of maintenance Alimta at 500 mg/sq m given every 3 weeks. The last dose was given 05/08/2011.  CURRENT THERAPY:  1. Maintenance Alimta at 500 mg/sq m given every 4 weeks. The patient is status post 11 cycles.  2. Zometa 4 mg IV given every month for bone metastasis.   INTERVAL HISTORY: Tamara Ortiz 71 y.o. female returns to the clinic today for followup visit. The patient is tolerating her treatment with maintenance Alimta fairly well with no significant complaints. She continues to remain active physically. She does complain of some stomach bloating and constipation. She takes MiraLAX for the constipation but not on a consistent basis. She requests a refill for her lorazepam. She voiced no other specific complaints today.  She denied having any significant chest pain or shortness of breath. She has no bony aches. No nausea or vomiting, no weight loss or night sweats.   MEDICAL HISTORY: Past Medical History  Diagnosis Date  . Hypertension   . HYPERLIPIDEMIA 11/05/2007  . ANXIETY 05/13/2009  . DEPRESSIVE DISORDER 05/06/2008  . HYPERTENSION 11/05/2007    . ALLERGIC RHINITIS 11/05/2007  . CONSTIPATION, CHRONIC 05/13/2009  . UTI 05/06/2008  . BACK PAIN 05/11/2008  . OSTEOPOROSIS 11/05/2007  . INSOMNIA-SLEEP DISORDER-UNSPEC 05/13/2009  . FATIGUE 05/13/2009  . Swelling, mass, or lump in chest 11/05/2007  . LUNG CANCER, HX OF 05/06/2008  . Cancer     lung ca    ALLERGIES:  is allergic to amoxicillin.  MEDICATIONS:  Current Outpatient Prescriptions  Medication Sig Dispense Refill  . aspirin 81 MG EC tablet Take 81 mg by mouth daily.        . Calcium Carbonate-Vitamin D (CALCIUM-VITAMIN D) 500-200 MG-UNIT per tablet Take 1 tablet by mouth 2 (two) times daily.        . Coenzyme Q10 (COQ10) 100 MG CAPS Take 100 mg by mouth daily.        . Flaxseed, Linseed, (FLAXSEED OIL) 1000 MG CAPS Take 1,000 mg by mouth daily.        . folic acid (FOLVITE) 1 MG tablet Take 1 tablet (1 mg total) by mouth daily.  90 tablet  1  . HYDROcodone-acetaminophen (NORCO) 10-325 MG per tablet       . LORazepam (ATIVAN) 0.5 MG tablet Take 1 tablet (0.5 mg total) by mouth every 8 (eight) hours as needed for anxiety.  40 tablet  0  . losartan-hydrochlorothiazide (HYZAAR) 50-12.5 MG per tablet Take 1 tablet by mouth daily.  90 tablet  2  . Multiple Vitamin (ANTIOXIDANT A/C/E PO) Take by mouth daily.        . Omega-3 350 MG  CAPS Take by mouth daily.        . polyethylene glycol powder (MIRALAX) powder Take 17 g by mouth daily.        . Probiotic Product (PROBIOTIC FORMULA) CAPS Take 1 capsule by mouth daily.        . Red Yeast Rice 600 MG CAPS Take 600 mg by mouth 2 (two) times daily.        . simvastatin (ZOCOR) 40 MG tablet Take 1 tablet (40 mg total) by mouth every evening.  90 tablet  2  . vitamin B-12 (CYANOCOBALAMIN) 100 MCG tablet Take 50 mcg by mouth daily.        . vitamin E ([employer]S VITAMIN E) 400 UNIT capsule Take 400 Units by mouth daily.        [name] zolendronic acid (ZOMETA) 4 MG/5ML injection Inject 4 mg into the vein once.         No current facility-administered  medications for this visit.   Facility-Administered Medications Ordered in Other Visits  Medication Dose Route Frequency Provider Last Rate Last Dose  . 0.9 %  sodium chloride infusion   Intravenous Once Si Gaul, MD      . dexamethasone (DECADRON) injection 10 mg  10 mg Intravenous Once Si Gaul, MD   10 mg at 03/25/12 1325  . ondansetron (ZOFRAN) IVPB 8 mg  8 mg Intravenous Once Si Gaul, MD   8 mg at 03/25/12 1324  . PEMEtrexed (ALIMTA) 875 mg in sodium chloride 0.9 % 100 mL chemo infusion  500 mg/m2 (Treatment Plan Actual) Intravenous Once Si Gaul, MD   875 mg at 03/25/12 1402  . Zoledronic Acid (ZOMETA) 4 mg IVPB  4 mg Intravenous Once Si Gaul, MD   4 mg at 03/25/12 1417    SURGICAL HISTORY:  Past Surgical History  Procedure Date  . Tubal ligation   . S/p lul lung surgury 12/2007  . Lobectomy     REVIEW OF SYSTEMS:  A comprehensive review of systems was negative except for: Gastrointestinal: positive for constipation   PHYSICAL EXAMINATION: General appearance: alert, cooperative and no distress Head: Normocephalic, without obvious abnormality, atraumatic Neck: no adenopathy Lymph nodes: Cervical, supraclavicular, and axillary nodes normal. Resp: clear to auscultation bilaterally Cardio: regular rate and rhythm, S1, S2 normal, no murmur, click, rub or gallop GI: soft, non-tender; bowel sounds normal; no masses,  no organomegaly Extremities: extremities normal, atraumatic, no cyanosis or edema Neurologic: Alert and oriented X 3, normal strength and tone. Normal symmetric reflexes. Normal coordination and gait  ECOG PERFORMANCE STATUS: 0 - Asymptomatic  Blood pressure 105/63, pulse 63, temperature 98.7 F (37.1 C), temperature source Oral, height 5\' 5"  (1.651 m), weight 145 lb 14.4 oz (66.18 kg).  LABORATORY DATA: Lab Results  Component Value Date   WBC 4.1 03/25/2012   HGB 11.3* 03/25/2012   HCT 33.1* 03/25/2012   MCV 95.4 03/25/2012   PLT  235 03/25/2012      Chemistry      Component Value Date/Time   NA 139 02/27/2012 1001   K 4.0 02/27/2012 1001   CL 103 02/27/2012 1001   CO2 29 02/27/2012 1001   BUN 12 02/27/2012 1001   CREATININE 1.15* 02/27/2012 1001      Component Value Date/Time   CALCIUM 9.9 02/27/2012 1001   ALKPHOS 59 02/27/2012 1001   AST 21 02/27/2012 1001   ALT 19 02/27/2012 1001   BILITOT 0.5 02/27/2012 1001       RADIOGRAPHIC STUDIES: Ct  Head W Wo Contrast  01/25/2012  *RADIOLOGY REPORT*  Clinical Data: Lung cancer.  Rule out metastatic disease.  CT HEAD WITHOUT AND WITH CONTRAST  Technique:  Contiguous axial images were obtained from the base of the skull through the vertex without and with intravenous contrast.  Contrast: OMNIPAQUE IOHEXOL 300 MG/ML  SOLN  Comparison: CT head 08/09/2011  Findings: Ventricle size is normal.  Mild hypodensity in the periventricular white matter, most compatible with chronic microvascular ischemia.  No acute infarct or hemorrhage.  Negative for mass lesion.  No enhancing lesions are identified. Negative for metastatic disease.  Calvarium is intact. Air-fluid levels in the sphenoid sinus compatible with sinusitis.  IMPRESSION: No acute abnormality and negative for metastatic disease.  Original Report Authenticated By: Camelia Phenes, M.D.   Ct Abdomen Pelvis W Contrast  01/25/2012  *RADIOLOGY REPORT*  Clinical Data: Lung cancer.  Restaging.  Ongoing chemotherapy. Metastatic adenocarcinoma of left lung.  CT ABDOMEN AND PELVIS WITH CONTRAST  Technique:  Multidetector CT imaging of the abdomen and pelvis was performed following the standard protocol during bolus administration of intravenous contrast.  Contrast: OMNIPAQUE IOHEXOL 300 MG/ML  SOLN  Comparison: 11/02/2011 (  Findings: Mild scarring/fissural thickening at the left lung base, unchanged.  Mild cardiomegaly, without pericardial or pleural effusion. Coronary artery atherosclerosis. A small hiatal hernia.  A subtle  hypoattenuating anterior liver lesion measures 4 mm on image 21. Not readily apparent on prior axial imaging.  Seen today on coronal image 12.  Possibly present on coronal image 18 of the prior exam.  Normal distal stomach, pancreas, gallbladder, biliary tract, adrenal glands, kidneys.  Small retroperitoneal nodes are unchanged, and not pathologic by size criteria.  No retrocrural adenopathy. Normal colon, appendix, and terminal ileum.  The appendix is positioned within the pelvis on image 71. Normal small bowel without abdominal ascites.    No evidence of omental or peritoneal disease.  No pelvic adenopathy.    Normal urinary bladder and uterus.  No adnexal mass. No significant free fluid.  Sclerosis within the left ilium measures 1.1 cm on image 62 versus 1.5 cm on the prior. The right sacral sclerotic lesion measures 1.0 cm today and is unchanged.  IMPRESSION:  1.  Stable to slight improvement in sclerotic osseous metastasis. 2. Subtle too small to characterize hepatic dome lesion.  Possibly present on the prior exam.  Not readily apparent on more remote coronal reformatted images.  Consider short-term follow-up with abdominal CT (i.e. 3 months).  Given small lesion size, MRI may be of little additional value. 3.  Small hiatal hernia.  Original Report Authenticated By: Consuello Bossier, M.D.    ASSESSMENT/PLAN: This is a very pleasant 71 years old white female with metastatic non-small cell lung cancer currently on maintenance chemotherapy with Alimta every 4 weeks, status post 11 cycles. The patient is tolerating her treatment fairly well with no significant complaints and she has no evidence for disease progression on her last scan. Patient was discussed with Dr. Arline Asp in  Dr. Asa Lente absence. She will continue on her maintenance chemotherapy with single agent Alimta given every 4 weeks at 500 mg per meter squared. She will also continue on monthly Zometa 4 mg IV for bone metastasis. She'll followup with  Dr. Arbutus Ped in one month with a repeat CBC differential and C. Met, as well as a CT of the chest abdomen and pelvis with contrast to reevaluate her disease prior to her next scheduled cycle of maintenance Alimta  and IV Zometa.  Laural Benes, Gibson Telleria E, PA-C   All questions were answered. The patient knows to call the clinic with any problems, questions or concerns. We can certainly see the patient much sooner if necessary.  I spent 20 minutes counseling the patient face to face. The total time spent in the appointment was 30 minutes.

## 2012-03-25 NOTE — Patient Instructions (Signed)
[facility] Discharge Instructions for Patients Receiving Chemotherapy  Today you received the following chemotherapy agents Alimta and Zometa  To help prevent nausea and vomiting after your treatment, we encourage you to take your nausea medication. Begin taking it as often as prescribed for Dr. Arbutus Ped.   If you develop nausea and vomiting that is not controlled by your nausea medication, call the clinic. If it is after clinic hours your family physician or the after hours number for the clinic or go to the Emergency Department.   BELOW ARE SYMPTOMS THAT SHOULD BE REPORTED IMMEDIATELY:  *FEVER GREATER THAN 100.5 F  *CHILLS WITH OR WITHOUT FEVER  NAUSEA AND VOMITING THAT IS NOT CONTROLLED WITH YOUR NAUSEA MEDICATION  *UNUSUAL SHORTNESS OF BREATH  *UNUSUAL BRUISING OR BLEEDING  TENDERNESS IN MOUTH AND THROAT WITH OR WITHOUT PRESENCE OF ULCERS  *URINARY PROBLEMS  *BOWEL PROBLEMS  UNUSUAL RASH Items with * indicate a potential emergency and should be followed up as soon as possible.  One of the nurses will contact you 24 hours after your treatment. Please let the nurse know about any problems that you may have experienced. Feel free to call the clinic you have any questions or concerns. The clinic phone number is [phone].   I have been informed and understand all the instructions given to me. I know to contact the clinic, my physician, or go to the Emergency Department if any problems should occur. I do not have any questions at this time, but understand that I may call the clinic during office hours   should I have any questions or need assistance in obtaining follow up care.    __________________________________________  _____________  __________ Signature of Patient or Authorized Representative            Date                   Time    __________________________________________ Nurse's Signature

## 2012-03-25 NOTE — Telephone Encounter (Signed)
Instructed pt about eating a little extra salt this week.

## 2012-03-26 ENCOUNTER — Ambulatory Visit: Payer: Medicare Other

## 2012-04-01 ENCOUNTER — Encounter: Payer: Self-pay | Admitting: *Deleted

## 2012-04-01 NOTE — Progress Notes (Signed)
Pt called stating that she is having a decreased appetite.  A few days ago she was experiencing some nausea, chills with no fever, and fatigue.  These symptoms have improved but the appetite remains low, per Dr Donnald Garre, will continue to monitor closely and she is to call if it does not improve. She verbalized understanding.  SLJ

## 2012-04-12 ENCOUNTER — Ambulatory Visit (INDEPENDENT_AMBULATORY_CARE_PROVIDER_SITE_OTHER): Payer: Medicare Other | Admitting: Internal Medicine

## 2012-04-12 ENCOUNTER — Encounter: Payer: Self-pay | Admitting: Internal Medicine

## 2012-04-12 VITALS — BP 112/78 | HR 58 | Temp 98.0°F | Ht 65.0 in | Wt 145.0 lb

## 2012-04-12 DIAGNOSIS — M25462 Effusion, left knee: Secondary | ICD-10-CM

## 2012-04-12 DIAGNOSIS — M25569 Pain in unspecified knee: Secondary | ICD-10-CM

## 2012-04-12 DIAGNOSIS — M25562 Pain in left knee: Secondary | ICD-10-CM

## 2012-04-12 DIAGNOSIS — G5600 Carpal tunnel syndrome, unspecified upper limb: Secondary | ICD-10-CM

## 2012-04-12 DIAGNOSIS — G5603 Carpal tunnel syndrome, bilateral upper limbs: Secondary | ICD-10-CM | POA: Insufficient documentation

## 2012-04-12 DIAGNOSIS — I10 Essential (primary) hypertension: Secondary | ICD-10-CM

## 2012-04-12 DIAGNOSIS — F411 Generalized anxiety disorder: Secondary | ICD-10-CM

## 2012-04-12 DIAGNOSIS — M25469 Effusion, unspecified knee: Secondary | ICD-10-CM

## 2012-04-12 NOTE — Patient Instructions (Addendum)
Continue all other medications as before You will be contacted regarding the referral for: orthopedic Please wear the wrist splints at night only for the finger numbness in the AM, as this is liekly early mild carpal tunnel

## 2012-04-13 ENCOUNTER — Encounter: Payer: Self-pay | Admitting: Internal Medicine

## 2012-04-13 NOTE — Assessment & Plan Note (Signed)
Mild, for bilat wrist splints qhs,  to f/u any worsening symptoms or concerns

## 2012-04-13 NOTE — Assessment & Plan Note (Signed)
stable overall by hx and exam, most recent data reviewed with pt, and pt to continue medical treatment as before BP Readings from Last 3 Encounters:  04/12/12 112/78  03/25/12 105/63  02/27/12 119/71

## 2012-04-13 NOTE — Assessment & Plan Note (Signed)
O/w stable overall by hx and exam,  and pt to continue medical treatment as before, reassured, current left knee pain unlikely related to met bone disease

## 2012-04-13 NOTE — Assessment & Plan Note (Signed)
C/w prob mild OA flare but exam but pain signficant, to cont current meds, and refer ortho

## 2012-04-13 NOTE — Progress Notes (Signed)
Subjective:    Patient ID: Tamara Ortiz, female    DOB: Nov 29, 1940, 71 y.o.   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   Pt denies chest pain, increased sob or doe, wheezing, orthopnea, PND, increased LE swelling, palpitations, dizziness or syncope.  No spine pain with known small mets.  Pt denies new neurological symptoms such as new headache, or facial or extremity weakness or numbness   Pt denies polydipsia, polyuria.   Pt denies fever, wt loss, night sweats, loss of appetite, or other constitutional symptoms.      Denies worsening depressive symptoms, suicidal ideation, or panic.  Also with mild finger numbness most days for the past month without pain or weakness, worse in the AM, works typing most days.   Past Medical History  Diagnosis Date  . Hypertension   . HYPERLIPIDEMIA 11/05/2007  . ANXIETY 05/13/2009  . DEPRESSIVE DISORDER 05/06/2008  . HYPERTENSION 11/05/2007  . ALLERGIC RHINITIS 11/05/2007  . CONSTIPATION, CHRONIC 05/13/2009  . UTI 05/06/2008  . BACK PAIN 05/11/2008  . OSTEOPOROSIS 11/05/2007  . INSOMNIA-SLEEP DISORDER-UNSPEC 05/13/2009  . FATIGUE 05/13/2009  . Swelling, mass, or lump in chest 11/05/2007  . LUNG CANCER, HX OF 05/06/2008  . Cancer     lung ca   Past Surgical History  Procedure Date  . Tubal ligation   . S/p lul lung surgury 12/2007  . Lobectomy     reports that she has never smoked. She has never used smokeless tobacco. She reports that she drinks alcohol. She reports that she does not use illicit drugs. family history includes ALS in her father; Alcohol abuse in her son; and Heart disease in her  mother. Allergies  Allergen Reactions  . Amoxicillin     REACTION: hives/itch   Current Outpatient Prescriptions on File Prior to Visit  Medication Sig Dispense Refill  . aspirin 81 MG EC tablet Take 81 mg by mouth daily.        . Calcium Carbonate-Vitamin D (CALCIUM-VITAMIN D) 500-200 MG-UNIT per tablet Take 1 tablet by mouth 2 (two) times daily.        . Coenzyme Q10 (COQ10) 100 MG CAPS Take 100 mg by mouth daily.        . Flaxseed, Linseed, (FLAXSEED OIL) 1000 MG CAPS Take 1,000 mg by mouth daily.        . folic acid (FOLVITE) 1 MG tablet Take 1 tablet (1 mg total) by mouth daily.  90 tablet  1  . HYDROcodone-acetaminophen (NORCO) 10-325 MG per tablet       . LORazepam (ATIVAN) 0.5 MG tablet Take 1 tablet (0.5 mg total) by mouth every 8 (eight) hours as needed for anxiety.  40 tablet  0  . losartan-hydrochlorothiazide (HYZAAR) 50-12.5 MG per tablet Take 1 tablet by mouth daily.  90 tablet  2  . Multiple Vitamin (ANTIOXIDANT A/C/E PO) Take by mouth daily.        . Omega-3 350 MG CAPS Take by mouth daily.        . polyethylene glycol powder (MIRALAX) powder Take 17 g by mouth daily.        [name]  Probiotic Product (PROBIOTIC FORMULA) CAPS Take 1 capsule by mouth daily.        . Red Yeast Rice 600 MG CAPS Take 600 mg by mouth 2 (two) times daily.        . simvastatin (ZOCOR) 40 MG tablet Take 1 tablet (40 mg total) by mouth every evening.  90 tablet  2  . vitamin B-12 (CYANOCOBALAMIN) 100 MCG tablet Take 50 mcg by mouth daily.        . vitamin E (CVS VITAMIN E) 400 UNIT capsule Take 400 Units by mouth daily.        [name] zolendronic acid (ZOMETA) 4 MG/5ML injection Inject 4 mg into the vein once.         Review of Systems Review of Systems  Constitutional: Negative for diaphoresis and unexpected weight change.  HENT: Negative for  tinnitus.   Eyes: Negative for photophobia and visual disturbance.  Respiratory: Negative for choking and stridor.   Gastrointestinal: Negative for vomiting and  blood in stool.  Genitourinary: Negative for hematuria and decreased urine volume.  Musculoskeletal: Negative for gait problem.  Skin: Negative for color change and wound.  Neurological: Negative for tremors and numbness.     Objective:   Physical Exam BP 112/78  Pulse 58  Temp 98 F (36.7 C) (Oral)  Ht 5\' 5"  (1.651 m)  Wt 145 lb (65.772 kg)  BMI 24.13 kg/m2  SpO2 98% Physical Exam  VS noted Constitutional: Pt appears well-developed and well-nourished.  HENT: Head: Normocephalic.  Right Ear: External ear normal.  Left Ear: External ear normal.  Eyes: Conjunctivae and EOM are normal. Pupils are equal, round, and reactive to light.  Neck: Normal range of motion. Neck supple.  Cardiovascular: Normal rate and regular rhythm.   Pulmonary/Chest: Effort normal and breath sounds normal.  Spine: nontender; left knee with trace effusion, small crepitus, FROM, NT, no joint line tender or instability Neurological: Pt is alert.  motor/dtr intact UE's Skin: Skin is warm. No erythema.  Psychiatric: Pt behavior is normal. Thought content normal.     Assessment & Plan:

## 2012-04-17 ENCOUNTER — Other Ambulatory Visit (HOSPITAL_BASED_OUTPATIENT_CLINIC_OR_DEPARTMENT_OTHER): Payer: Medicare Other | Admitting: Lab

## 2012-04-17 ENCOUNTER — Ambulatory Visit (HOSPITAL_COMMUNITY)
Admission: RE | Admit: 2012-04-17 | Discharge: 2012-04-17 | Disposition: A | Discharge status: Home / Self Care | Payer: Medicare Other | Source: Ambulatory Visit | Attending: Physician Assistant | Admitting: Physician Assistant

## 2012-04-17 DIAGNOSIS — Z79899 Other long term (current) drug therapy: Secondary | ICD-10-CM | POA: Insufficient documentation

## 2012-04-17 DIAGNOSIS — C3410 Malignant neoplasm of upper lobe, unspecified bronchus or lung: Secondary | ICD-10-CM

## 2012-04-17 DIAGNOSIS — C3490 Malignant neoplasm of unspecified part of unspecified bronchus or lung: Secondary | ICD-10-CM | POA: Insufficient documentation

## 2012-04-17 DIAGNOSIS — M899 Disorder of bone, unspecified: Secondary | ICD-10-CM | POA: Insufficient documentation

## 2012-04-17 DIAGNOSIS — Z902 Acquired absence of lung [part of]: Secondary | ICD-10-CM | POA: Insufficient documentation

## 2012-04-17 LAB — CMP (CANCER CENTER ONLY)
ALT(SGPT): 31 U/L (ref 10–47)
Albumin: 3.2 g/dL — ABNORMAL LOW (ref 3.3–5.5)
CO2: 29 mEq/L (ref 18–33)
Calcium: 9.5 mg/dL (ref 8.0–10.3)
Chloride: 99 mEq/L (ref 98–108)
Sodium: 140 mEq/L (ref 128–145)
Total Protein: 6.9 g/dL (ref 6.4–8.1)

## 2012-04-17 LAB — CBC WITH DIFFERENTIAL/PLATELET
BASO%: 1.1 % (ref 0.0–2.0)
Eosinophils Absolute: 0 10*3/uL (ref 0.0–0.5)
HCT: 35.6 % (ref 34.8–46.6)
MCHC: 34.2 g/dL (ref 31.5–36.0)
MONO#: 0.4 10*3/uL (ref 0.1–0.9)
NEUT#: 1.7 10*3/uL (ref 1.5–6.5)
NEUT%: 51.2 % (ref 38.4–76.8)
RBC: 3.6 10*6/uL — ABNORMAL LOW (ref 3.70–5.45)
WBC: 3.4 10*3/uL — ABNORMAL LOW (ref 3.9–10.3)
lymph#: 1.2 10*3/uL (ref 0.9–3.3)

## 2012-04-17 MED ORDER — IOHEXOL 300 MG/ML  SOLN
100.0000 mL | Freq: Once | INTRAMUSCULAR | Status: AC | PRN
  Administered 2012-04-17: 100 mL via INTRAVENOUS

## 2012-04-22 ENCOUNTER — Ambulatory Visit (HOSPITAL_BASED_OUTPATIENT_CLINIC_OR_DEPARTMENT_OTHER): Payer: Medicare Other

## 2012-04-22 ENCOUNTER — Telehealth: Payer: Self-pay | Admitting: *Deleted

## 2012-04-22 ENCOUNTER — Other Ambulatory Visit: Payer: Medicare Other | Admitting: Lab

## 2012-04-22 ENCOUNTER — Other Ambulatory Visit: Payer: Self-pay | Admitting: Medical Oncology

## 2012-04-22 ENCOUNTER — Ambulatory Visit (HOSPITAL_BASED_OUTPATIENT_CLINIC_OR_DEPARTMENT_OTHER): Payer: Medicare Other | Admitting: Internal Medicine

## 2012-04-22 VITALS — BP 124/79 | HR 61 | Temp 97.7°F | Ht 65.0 in | Wt 145.3 lb

## 2012-04-22 DIAGNOSIS — Z5111 Encounter for antineoplastic chemotherapy: Secondary | ICD-10-CM

## 2012-04-22 DIAGNOSIS — C3490 Malignant neoplasm of unspecified part of unspecified bronchus or lung: Secondary | ICD-10-CM

## 2012-04-22 DIAGNOSIS — C7951 Secondary malignant neoplasm of bone: Secondary | ICD-10-CM

## 2012-04-22 DIAGNOSIS — C3410 Malignant neoplasm of upper lobe, unspecified bronchus or lung: Secondary | ICD-10-CM

## 2012-04-22 MED ORDER — SODIUM CHLORIDE 0.9 % IV SOLN
Freq: Once | INTRAVENOUS | Status: AC
  Administered 2012-04-22: 50 mL via INTRAVENOUS

## 2012-04-22 MED ORDER — ONDANSETRON 8 MG/50ML IVPB (CHCC)
8.0000 mg | Freq: Once | INTRAVENOUS | Status: AC
  Administered 2012-04-22: 8 mg via INTRAVENOUS

## 2012-04-22 MED ORDER — DEXAMETHASONE SODIUM PHOSPHATE 10 MG/ML IJ SOLN
10.0000 mg | Freq: Once | INTRAMUSCULAR | Status: AC
  Administered 2012-04-22: 10 mg via INTRAVENOUS

## 2012-04-22 MED ORDER — PEMETREXED DISODIUM CHEMO INJECTION 500 MG
500.0000 mg/m2 | Freq: Once | INTRAVENOUS | Status: AC
  Administered 2012-04-22: 875 mg via INTRAVENOUS
  Filled 2012-04-22: qty 35

## 2012-04-22 NOTE — Patient Instructions (Signed)
[facility] Discharge Instructions for Patients Receiving Chemotherapy  Today you received the following chemotherapy agents Alimta  And Zometa. To help prevent nausea and vomiting after your treatment, we encourage you to take your nausea medication per Dr. Arbutus Ped.    If you develop nausea and vomiting that is not controlled by your nausea medication, call the clinic. If it is after clinic hours your family physician or the after hours number for the clinic or go to the Emergency Department.   BELOW ARE SYMPTOMS THAT SHOULD BE REPORTED IMMEDIATELY:  *FEVER GREATER THAN 100.5 F  *CHILLS WITH OR WITHOUT FEVER  NAUSEA AND VOMITING THAT IS NOT CONTROLLED WITH YOUR NAUSEA MEDICATION  *UNUSUAL SHORTNESS OF BREATH  *UNUSUAL BRUISING OR BLEEDING  TENDERNESS IN MOUTH AND THROAT WITH OR WITHOUT PRESENCE OF ULCERS  *URINARY PROBLEMS  *BOWEL PROBLEMS  UNUSUAL RASH Items with * indicate a potential emergency and should be followed up as soon as possible.  . Feel free to call the clinic you have any questions or concerns. The clinic phone number is [phone].   I have been informed and understand all the instructions given to me. I know to contact the clinic, my physician, or go to the Emergency Department if any problems should occur. I do not have any questions at this time, but understand that I may call the clinic during office hours   should I have any questions or need assistance in obtaining follow up care.    __________________________________________  _____________  __________ Signature of Patient or Authorized Representative            Date                   Time    __________________________________________ Nurse's Signature

## 2012-04-22 NOTE — Telephone Encounter (Signed)
Per staff message and POF I have scheduled appt.  [name]  

## 2012-04-22 NOTE — Telephone Encounter (Signed)
Gave patient appointment for 05-21-2012 sent michelle email to set up treatment

## 2012-04-22 NOTE — Progress Notes (Addendum)
Pt. Was seen by Dr. Arbutus Ped today. MD notified of lab done on 7/17./13 and chemo order dated on 7/15.  Order date changed by MD to treat today. HL Per pharmacist Brandy,  creatinine done on 7/17  At 1.4. Zometa held today. HL

## 2012-04-22 NOTE — Progress Notes (Signed)
[facility] Telephone:(336) [phone]   Fax:([phone]  OFFICE PROGRESS NOTE  [name], MD [address] [facility] [address] 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   PRIOR THERAPY:  1. Status post left upper lobectomy with mediastinal lymph node dissection under the care of Dr. Dorris Fetch on December 10, 2007.  2. Status post 4 cycles of adjuvant chemotherapy with cisplatin and Taxotere. Last dose was given 03/22/2008.  3. Status post 6 cycles of systemic chemotherapy with carboplatin and Alimta for metastatic disease in the bone. The last dose was given July 02, 2010, with stable disease.  4. Status post 12 cycles of maintenance Alimta at 500 mg/sq m given every 3 weeks. The last dose was given 05/08/2011.  CURRENT THERAPY:  1. Maintenance Alimta at 500 mg/sq m given every 4 weeks. The patient is status post 12 cycles.  2. Zometa 4 mg IV given every month for bone metastasis.   INTERVAL HISTORY: Tamara Ortiz 71 y.o. female returns to the clinic today for routine followup visit. The patient is feeling fine today with no specific complaints except for pain on the left knee area. She is scheduled to see an orthopedic surgeon in the next few weeks. She denied having any significant weight loss or night sweats. She has no chest pain, shortness of breath, cough or hemoptysis. She had mild nausea for a few days after her chemotherapy improved with Compazine. The patient tolerated the last cycle of her chemotherapy with Alimta fairly well. She has repeat CT scan of the chest, abdomen and pelvis performed recently and she is here today for evaluation and discussion of her scan results.  MEDICAL HISTORY: Past Medical History  Diagnosis Date  . Hypertension   .  HYPERLIPIDEMIA 11/05/2007  . ANXIETY 05/13/2009  . DEPRESSIVE DISORDER 05/06/2008  . HYPERTENSION 11/05/2007  . ALLERGIC RHINITIS 11/05/2007  . CONSTIPATION, CHRONIC 05/13/2009  . UTI 05/06/2008  . BACK PAIN 05/11/2008  . OSTEOPOROSIS 11/05/2007  . INSOMNIA-SLEEP DISORDER-UNSPEC 05/13/2009  . FATIGUE 05/13/2009  . Swelling, mass, or lump in chest 11/05/2007  . LUNG CANCER, HX OF 05/06/2008  . Cancer     lung ca    ALLERGIES:  is allergic to amoxicillin.  MEDICATIONS:  Current Outpatient Prescriptions  Medication Sig Dispense Refill  . aspirin 81 MG EC tablet Take 81 mg by mouth daily.        . Calcium Carbonate-Vitamin D (CALCIUM-VITAMIN D) 500-200 MG-UNIT per tablet Take 1 tablet by mouth 2 (two) times daily.        . Coenzyme Q10 (COQ10) 100 MG CAPS Take 100 mg by mouth daily.        . Flaxseed, Linseed, (FLAXSEED OIL) 1000 MG CAPS Take 1,000 mg by mouth daily.        . folic acid (FOLVITE) 1 MG tablet Take 1 tablet (1 mg total) by mouth daily.  90 tablet  1  . HYDROcodone-acetaminophen (NORCO) 10-325 MG per tablet       . LORazepam (ATIVAN) 0.5 MG tablet Take 1 tablet (0.5 mg total) by mouth every 8 (eight) hours as needed for anxiety.  40 tablet  0  . losartan-hydrochlorothiazide (HYZAAR) 50-12.5 MG per tablet Take 1 tablet by mouth daily.  90 tablet  2  .  Multiple Vitamin (ANTIOXIDANT A/C/E PO) Take by mouth daily.        . Omega-3 350 MG CAPS Take by mouth daily.        . polyethylene glycol powder (MIRALAX) powder Take 17 g by mouth daily.        . Probiotic Product (PROBIOTIC FORMULA) CAPS Take 1 capsule by mouth daily.        . Red Yeast Rice 600 MG CAPS Take 600 mg by mouth 2 (two) times daily.        . simvastatin (ZOCOR) 40 MG tablet Take 1 tablet (40 mg total) by mouth every evening.  90 tablet  2  . vitamin B-12 (CYANOCOBALAMIN) 100 MCG tablet Take 50 mcg by mouth daily.        . vitamin E (CVS VITAMIN E) 400 UNIT capsule Take 400 Units by mouth daily.        [name] zolendronic acid  (ZOMETA) 4 MG/5ML injection Inject 4 mg into the vein once.          SURGICAL HISTORY:  Past Surgical History  Procedure Date  . Tubal ligation   . S/p lul lung surgury 12/2007  . Lobectomy     REVIEW OF SYSTEMS:  A comprehensive review of systems was negative except for: Musculoskeletal: positive for arthralgias   PHYSICAL EXAMINATION: General appearance: alert, cooperative and no distress Head: Normocephalic, without obvious abnormality, atraumatic Neck: no adenopathy Lymph nodes: Cervical, supraclavicular, and axillary nodes normal. Resp: clear to auscultation bilaterally Cardio: regular rate and rhythm, S1, S2 normal, no murmur, click, rub or gallop GI: soft, non-tender; bowel sounds normal; no masses,  no organomegaly Extremities: extremities normal, atraumatic, no cyanosis or edema Neurologic: Alert and oriented X 3, normal strength and tone. Normal symmetric reflexes. Normal coordination and gait  ECOG PERFORMANCE STATUS: 1 - Symptomatic but completely ambulatory  Blood pressure 124/79, pulse 61, temperature 97.7 F (36.5 C), temperature source Oral, height 5\' 5"  (1.651 m), weight 145 lb 4.8 oz (65.908 kg).  LABORATORY DATA: Lab Results  Component Value Date   WBC 3.4* 04/17/2012   HGB 12.2 04/17/2012   HCT 35.6 04/17/2012   MCV 98.9 04/17/2012   PLT 313 04/17/2012      Chemistry      Component Value Date/Time   NA 140 04/17/2012 0855   NA 130* 03/25/2012 1144   K 4.1 04/17/2012 0855   K 4.0 03/25/2012 1144   CL 99 04/17/2012 0855   CL 99 03/25/2012 1144   CO2 29 04/17/2012 0855   CO2 28 03/25/2012 1144   BUN 15 04/17/2012 0855   BUN 14 03/25/2012 1144   CREATININE 1.4* 04/17/2012 0855   CREATININE 1.12* 03/25/2012 1144      Component Value Date/Time   CALCIUM 9.5 04/17/2012 0855   CALCIUM 9.6 03/25/2012 1144   ALKPHOS 68 04/17/2012 0855   ALKPHOS 61 03/25/2012 1144   AST 29 04/17/2012 0855   AST 26 03/25/2012 1144   ALT 21 03/25/2012 1144   BILITOT 0.60 04/17/2012 0855    BILITOT 0.4 03/25/2012 1144       RADIOGRAPHIC STUDIES: Ct Chest W Contrast  04/17/2012  *RADIOLOGY REPORT*  Clinical Data:  Restaging lung carcinoma.  Left lung resection, chemotherapy ongoing.  CT CHEST, ABDOMEN AND PELVIS WITH CONTRAST  Technique:  Multidetector CT imaging of the chest, abdomen and pelvis was performed following the standard protocol during bolus administration of intravenous contrast.  Contrast: OMNIPAQUE IOHEXOL 300 MG/ML  SOLN  Comparison:   None.   CT CHEST  Findings:  No axillary or supraclavicular lymphadenopathy.  No mediastinal or hilar lymphadenopathy.  No pericardial fluid. Hiatal hernia is present.  Review of the lung parenchyma demonstrates no new or suspicious pulmonary nodules.  IMPRESSION: No evidence of lung cancer recurrence.   CT ABDOMEN AND PELVIS  Findings:  No focal hepatic lesion.  The gallbladder, pancreas, spleen, adrenal glands, and kidneys are normal.  The stomach, small bowel and colon are normal.  Small splenule is noted adjacent to spleen.  Abdominal aorta normal caliber.  No retroperitoneal periportal lymphadenopathy.  No free fluid the pelvis.  Uterus and bladder normal.  No pelvic lymphadenopathy. Review of  bone windows demonstrates no aggressive osseous lesions.  IMPRESSION: 1.  No evidence of lung cancer metastasis within the abdomen or pelvis.  2.  Stable sclerotic lesion within the mid-thoracic spine.  Original Report Authenticated By: Genevive Bi, M.D.     ASSESSMENT: This is a very pleasant 71 years old white female with metastatic non-small cell lung cancer, adenocarcinoma currently on maintenance chemotherapy with Alimta status post 24 cycles. His treatment is currently on monthly basis. She has no evidence for disease progression on the recent scan.  PLAN: I discussed the scan results with the patient today and recommended for her to continue on maintenance treatment with Alimta a monthly basis. She would also have her Zometa  infusion monthly. She would come back for followup visit in one month with the start of the next cycle of her treatment. She is thinking about taking break from treatment but she would like to wait until she finishes the next 3 cycles and has repeat CT scan.  All questions were answered. The patient knows to call the clinic with any problems, questions or concerns. We can certainly see the patient much sooner if necessary.  I spent 15 minutes counseling the patient face to face. The total time spent in the appointment was 25 minutes.

## 2012-04-22 NOTE — Progress Notes (Signed)
Labs ordered.

## 2012-04-23 ENCOUNTER — Ambulatory Visit: Payer: Medicare Other

## 2012-04-23 ENCOUNTER — Other Ambulatory Visit: Payer: Self-pay | Admitting: Certified Registered Nurse Anesthetist

## 2012-04-29 ENCOUNTER — Other Ambulatory Visit: Payer: Self-pay | Admitting: Physician Assistant

## 2012-04-29 DIAGNOSIS — C801 Malignant (primary) neoplasm, unspecified: Secondary | ICD-10-CM

## 2012-04-29 DIAGNOSIS — C7951 Secondary malignant neoplasm of bone: Secondary | ICD-10-CM

## 2012-04-29 DIAGNOSIS — C3490 Malignant neoplasm of unspecified part of unspecified bronchus or lung: Secondary | ICD-10-CM

## 2012-04-29 DIAGNOSIS — C3430 Malignant neoplasm of lower lobe, unspecified bronchus or lung: Secondary | ICD-10-CM

## 2012-05-16 ENCOUNTER — Other Ambulatory Visit (HOSPITAL_COMMUNITY): Payer: Self-pay | Admitting: Orthopedic Surgery

## 2012-05-16 DIAGNOSIS — C3490 Malignant neoplasm of unspecified part of unspecified bronchus or lung: Secondary | ICD-10-CM

## 2012-05-16 DIAGNOSIS — M25561 Pain in right knee: Secondary | ICD-10-CM

## 2012-05-20 ENCOUNTER — Other Ambulatory Visit: Payer: Medicare Other | Admitting: Lab

## 2012-05-20 ENCOUNTER — Encounter (HOSPITAL_COMMUNITY)
Admission: RE | Admit: 2012-05-20 | Discharge: 2012-05-20 | Disposition: A | Discharge status: Home / Self Care | Payer: Medicare Other | Source: Ambulatory Visit | Attending: Orthopedic Surgery | Admitting: Orthopedic Surgery

## 2012-05-20 ENCOUNTER — Ambulatory Visit (HOSPITAL_COMMUNITY)
Admission: RE | Admit: 2012-05-20 | Discharge: 2012-05-20 | Disposition: A | Discharge status: Home / Self Care | Payer: Medicare Other | Source: Ambulatory Visit | Attending: Orthopedic Surgery | Admitting: Orthopedic Surgery

## 2012-05-20 ENCOUNTER — Ambulatory Visit (HOSPITAL_COMMUNITY): Payer: Medicare Other

## 2012-05-20 DIAGNOSIS — C3490 Malignant neoplasm of unspecified part of unspecified bronchus or lung: Secondary | ICD-10-CM

## 2012-05-20 DIAGNOSIS — C7951 Secondary malignant neoplasm of bone: Secondary | ICD-10-CM | POA: Insufficient documentation

## 2012-05-20 DIAGNOSIS — M25562 Pain in left knee: Secondary | ICD-10-CM

## 2012-05-20 DIAGNOSIS — M25569 Pain in unspecified knee: Secondary | ICD-10-CM | POA: Insufficient documentation

## 2012-05-20 MED ORDER — TECHNETIUM TC 99M MEDRONATE IV KIT
25.0000 | PACK | Freq: Once | INTRAVENOUS | Status: AC | PRN
  Administered 2012-05-20: 25 via INTRAVENOUS

## 2012-05-21 ENCOUNTER — Other Ambulatory Visit (HOSPITAL_BASED_OUTPATIENT_CLINIC_OR_DEPARTMENT_OTHER): Payer: Medicare Other | Admitting: Lab

## 2012-05-21 ENCOUNTER — Ambulatory Visit (HOSPITAL_BASED_OUTPATIENT_CLINIC_OR_DEPARTMENT_OTHER): Payer: Medicare Other

## 2012-05-21 ENCOUNTER — Telehealth: Payer: Self-pay | Admitting: Internal Medicine

## 2012-05-21 ENCOUNTER — Ambulatory Visit (HOSPITAL_BASED_OUTPATIENT_CLINIC_OR_DEPARTMENT_OTHER): Payer: Medicare Other | Admitting: Internal Medicine

## 2012-05-21 ENCOUNTER — Telehealth: Payer: Self-pay | Admitting: *Deleted

## 2012-05-21 VITALS — BP 116/75 | HR 70 | Temp 97.6°F | Resp 20 | Ht 65.0 in | Wt 146.4 lb

## 2012-05-21 DIAGNOSIS — Z5111 Encounter for antineoplastic chemotherapy: Secondary | ICD-10-CM

## 2012-05-21 DIAGNOSIS — M25569 Pain in unspecified knee: Secondary | ICD-10-CM

## 2012-05-21 DIAGNOSIS — C3410 Malignant neoplasm of upper lobe, unspecified bronchus or lung: Secondary | ICD-10-CM

## 2012-05-21 DIAGNOSIS — C3490 Malignant neoplasm of unspecified part of unspecified bronchus or lung: Secondary | ICD-10-CM

## 2012-05-21 DIAGNOSIS — C7951 Secondary malignant neoplasm of bone: Secondary | ICD-10-CM

## 2012-05-21 DIAGNOSIS — C7952 Secondary malignant neoplasm of bone marrow: Secondary | ICD-10-CM

## 2012-05-21 LAB — COMPREHENSIVE METABOLIC PANEL WITH GFR
ALT: 16 U/L (ref 0–35)
AST: 19 U/L (ref 0–37)
Albumin: 4.1 g/dL (ref 3.5–5.2)
Alkaline Phosphatase: 63 U/L (ref 39–117)
BUN: 20 mg/dL (ref 6–23)
CO2: 27 meq/L (ref 19–32)
Calcium: 9.9 mg/dL (ref 8.4–10.5)
Chloride: 102 meq/L (ref 96–112)
Creatinine, Ser: 1.16 mg/dL — ABNORMAL HIGH (ref 0.50–1.10)
Glucose, Bld: 86 mg/dL (ref 70–99)
Potassium: 4.1 meq/L (ref 3.5–5.3)
Sodium: 137 meq/L (ref 135–145)
Total Bilirubin: 0.4 mg/dL (ref 0.3–1.2)
Total Protein: 6.5 g/dL (ref 6.0–8.3)

## 2012-05-21 LAB — CBC WITH DIFFERENTIAL/PLATELET
BASO%: 1.1 % (ref 0.0–2.0)
Basophils Absolute: 0.1 10*3/uL (ref 0.0–0.1)
EOS%: 1.6 % (ref 0.0–7.0)
HGB: 11.8 g/dL (ref 11.6–15.9)
MCH: 33.3 pg (ref 25.1–34.0)
MCHC: 34.4 g/dL (ref 31.5–36.0)
RDW: 13.3 % (ref 11.2–14.5)
lymph#: 2 10*3/uL (ref 0.9–3.3)
nRBC: 0 % (ref 0–0)

## 2012-05-21 MED ORDER — ONDANSETRON 8 MG/50ML IVPB (CHCC)
8.0000 mg | Freq: Once | INTRAVENOUS | Status: AC
  Administered 2012-05-21: 8 mg via INTRAVENOUS

## 2012-05-21 MED ORDER — PEMETREXED DISODIUM CHEMO INJECTION 500 MG
500.0000 mg/m2 | Freq: Once | INTRAVENOUS | Status: AC
  Administered 2012-05-21: 875 mg via INTRAVENOUS
  Filled 2012-05-21: qty 35

## 2012-05-21 MED ORDER — CYANOCOBALAMIN 1000 MCG/ML IJ SOLN
1000.0000 ug | Freq: Once | INTRAMUSCULAR | Status: AC
  Administered 2012-05-21: 1000 ug via INTRAMUSCULAR

## 2012-05-21 MED ORDER — SODIUM CHLORIDE 0.9 % IV SOLN
Freq: Once | INTRAVENOUS | Status: AC
  Administered 2012-05-21: 11:00:00 via INTRAVENOUS

## 2012-05-21 MED ORDER — DEXAMETHASONE SODIUM PHOSPHATE 10 MG/ML IJ SOLN
10.0000 mg | Freq: Once | INTRAMUSCULAR | Status: AC
  Administered 2012-05-21: 10 mg via INTRAVENOUS

## 2012-05-21 NOTE — Telephone Encounter (Signed)
gve the pt her sept 2013 appt calendar . Pt is aware that her chemo appts will be added. Sent michelle a staff message to add the chemo

## 2012-05-21 NOTE — Progress Notes (Signed)
[facility] Telephone:(336) [phone]   Fax:(336) [phone]  OFFICE PROGRESS NOTE  [name], MD [address] 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   PRIOR THERAPY:  1. Status post left upper lobectomy with mediastinal lymph node dissection under the care of Dr. Dorris Fetch on December 10, 2007.  2. Status post 4 cycles of adjuvant chemotherapy with cisplatin and Taxotere. Last dose was given 03/22/2008.  3. Status post 6 cycles of systemic chemotherapy with carboplatin and Alimta for metastatic disease in the bone. The last dose was given July 02, 2010, with stable disease.  4. Status post 12 cycles of maintenance Alimta at 500 mg/sq m given every 3 weeks. The last dose was given 05/08/2011.  CURRENT THERAPY:  1. Maintenance Alimta at 500 mg/sq m given every 4 weeks. The patient is status post 13 cycles.  2. Zometa 4 mg IV given every 2 months for bone metastasis.   INTERVAL HISTORY: Tamara Ortiz 71 y.o. female returns to the clinic today for routine followup visit. The patient is feeling fine with no specific complaints except for pain in the left knee. She was seen by Dr. Rennis Chris for evaluation and she had x-ray of the left knee and left hip performed that showed question arthritis. He also ordered bone scan that was performed yesterday and showed foci of uptake in the midthoracic spine, lower sacrum and left iliac wing corresponding to sclerotic lesions on the comparison examination of 04/17/2012 scan favoring metastatic disease. The patient denied having any other significant complaints. She denied having any significant chest pain, shortness breath, cough or hemoptysis. She denied having any significant weight loss or night sweats.  MEDICAL  HISTORY: Past Medical History  Diagnosis Date  . Hypertension   . HYPERLIPIDEMIA 11/05/2007  . ANXIETY 05/13/2009  . DEPRESSIVE DISORDER 05/06/2008  . HYPERTENSION 11/05/2007  . ALLERGIC RHINITIS 11/05/2007  . CONSTIPATION, CHRONIC 05/13/2009  . UTI 05/06/2008  . BACK PAIN 05/11/2008  . OSTEOPOROSIS 11/05/2007  . INSOMNIA-SLEEP DISORDER-UNSPEC 05/13/2009  . FATIGUE 05/13/2009  . Swelling, mass, or lump in chest 11/05/2007  . LUNG CANCER, HX OF 05/06/2008  . Cancer     lung ca    ALLERGIES:  is allergic to amoxicillin.  MEDICATIONS:  Current Outpatient Prescriptions  Medication Sig Dispense Refill  . aspirin 81 MG EC tablet Take 81 mg by mouth daily.        . Calcium Carbonate-Vitamin D (CALCIUM-VITAMIN D) 500-200 MG-UNIT per tablet Take 1 tablet by mouth 2 (two) times daily.        . Coenzyme Q10 (COQ10) 100 MG CAPS Take 100 mg by mouth daily.        . Flaxseed, Linseed, (FLAXSEED OIL) 1000 MG CAPS Take 1,000 mg by mouth daily.        . folic acid (FOLVITE) 1 MG tablet Take 1 tablet (1 mg total) by mouth daily.  90 tablet  1  . HYDROcodone-acetaminophen (NORCO) 10-325 MG per tablet       . LORazepam (ATIVAN) 0.5 MG tablet TAKE 1 TABLET BY MOUTH EVERY 8 HOURS AS NEEDED FOR ANXIETY  40 tablet  0  . losartan-hydrochlorothiazide (HYZAAR) 50-12.5 MG per tablet Take 1 tablet by mouth daily.  90 tablet  2  .  Multiple Vitamin (ANTIOXIDANT A/C/E PO) Take by mouth daily.        . Omega-3 350 MG CAPS Take by mouth daily.        . polyethylene glycol powder (MIRALAX) powder Take 17 g by mouth daily.        . Probiotic Product (PROBIOTIC FORMULA) CAPS Take 1 capsule by mouth daily.        . Red Yeast Rice 600 MG CAPS Take 600 mg by mouth 2 (two) times daily.        . simvastatin (ZOCOR) 40 MG tablet Take 1 tablet (40 mg total) by mouth every evening.  90 tablet  2  . vitamin B-12 (CYANOCOBALAMIN) 100 MCG tablet Take 50 mcg by mouth daily.        . vitamin E (CVS VITAMIN E) 400 UNIT capsule Take 400 Units by  mouth daily.        [name] zolendronic acid (ZOMETA) 4 MG/5ML injection Inject 4 mg into the vein once.         No current facility-administered medications for this visit.   Facility-Administered Medications Ordered in Other Visits  Medication Dose Route Frequency Provider Last Rate Last Dose  . technetium medronate (TC-MDP) injection 25 milli Curie  25 milli Curie Intravenous Once PRN Medication Radiologist, MD   25 milli Curie at 05/20/12 1200    SURGICAL HISTORY:  Past Surgical History  Procedure Date  . Tubal ligation   . S/p lul lung surgury 12/2007  . Lobectomy     REVIEW OF SYSTEMS:  A comprehensive review of systems was negative except for: Musculoskeletal: positive for arthralgias   PHYSICAL EXAMINATION: General appearance: alert, cooperative and no distress Head: Normocephalic, without obvious abnormality, atraumatic Neck: no adenopathy Lymph nodes: Cervical, supraclavicular, and axillary nodes normal. Resp: clear to auscultation bilaterally Back: negative, symmetric, no curvature. ROM normal. No CVA tenderness. Cardio: regular rate and rhythm, S1, S2 normal, no murmur, click, rub or gallop GI: soft, non-tender; bowel sounds normal; no masses,  no organomegaly Extremities: extremities normal, atraumatic, no cyanosis or edema Neurologic: Alert and oriented X 3, normal strength and tone. Normal symmetric reflexes. Normal coordination and gait  ECOG PERFORMANCE STATUS: 1 - Symptomatic but completely ambulatory  Blood pressure 116/75, pulse 70, temperature 97.6 F (36.4 C), temperature source Oral, resp. rate 20, height 5\' 5"  (1.651 m), weight 146 lb 6.4 oz (66.407 kg).  LABORATORY DATA: Lab Results  Component Value Date   WBC 4.4 05/21/2012   HGB 11.8 05/21/2012   HCT 34.3* 05/21/2012   MCV 96.9 05/21/2012   PLT 239 05/21/2012      Chemistry      Component Value Date/Time   NA 140 04/17/2012 0855   NA 130* 03/25/2012 1144   K 4.1 04/17/2012 0855   K 4.0 03/25/2012 1144    CL 99 04/17/2012 0855   CL 99 03/25/2012 1144   CO2 29 04/17/2012 0855   CO2 28 03/25/2012 1144   BUN 15 04/17/2012 0855   BUN 14 03/25/2012 1144   CREATININE 1.4* 04/17/2012 0855   CREATININE 1.12* 03/25/2012 1144      Component Value Date/Time   CALCIUM 9.5 04/17/2012 0855   CALCIUM 9.6 03/25/2012 1144   ALKPHOS 68 04/17/2012 0855   ALKPHOS 61 03/25/2012 1144   AST 29 04/17/2012 0855   AST 26 03/25/2012 1144   ALT 21 03/25/2012 1144   BILITOT 0.60 04/17/2012 0855   BILITOT 0.4 03/25/2012 1144  RADIOGRAPHIC STUDIES: Nm Bone Scan Whole Body  05/20/2012  *RADIOLOGY REPORT*  Clinical Data: Lung cancer with bone metastases.  Left knee pain.  NUCLEAR MEDICINE WHOLE BODY BONE SCINTIGRAPHY  Technique:  Whole body anterior and posterior images were obtained approximately 3 hours after intravenous injection of radiopharmaceutical.  Radiopharmaceutical: CURIE TC-MDP TECHNETIUM TC 34M MEDRONATE IV KIT  Comparison: CT chest abdomen pelvis 04/17/2012 and bone scan 07/13/2009. PET 11/15/2007.  Findings: Foci of uptake are seen in the mid thoracic spine, lower sacrum and left iliac wing, adjacent to the sacroiliac joint. These correspond to sclerotic lesions on comparison examination of 04/17/2012.  Degenerative uptake is seen at L5-S1, where there is prominent facet hypertrophy.  Mild degenerative uptake in the knees.  IMPRESSION: Foci of uptake in the mid thoracic spine, lower sacrum and left iliac wing correspond to sclerotic lesions on comparison examination of 04/17/2012, favoring metastatic disease.   Original Report Authenticated By: Reyes Ivan, M.D.     ASSESSMENT: This is a very pleasant 71 years old white female with metastatic non-small cell lung cancer currently on maintenance and Alimta as well as Zometa on a monthly basis. The patient is doing fine and tolerating her treatment fairly well except for arthralgias which could be related in part to with the Zometa treatment.  PLAN: I  recommended for the patient to continue with her maintenance treatment with Alimta as scheduled. She will receive cycle #14 today. She has been on Zometa on monthly basis for more than 2 years. I would change the frequency of her Zometa 2 every 2 months. The patient was advised to continue on calcium supplement and vitamin D and also to maintain a good dental hygiene. She would come back for followup visit in one month's for the next cycle of her chemotherapy. She was advised to call me immediately if she has any concerning symptoms in the interval.  All questions were answered. The patient knows to call the clinic with any problems, questions or concerns. We can certainly see the patient much sooner if necessary.  I spent 15 minutes counseling the patient face to face. The total time spent in the appointment was 25 minutes.

## 2012-05-21 NOTE — Patient Instructions (Addendum)
[facility] Discharge Instructions for Patients Receiving Chemotherapy  Today you received the following chemotherapy agents Alimta  To help prevent nausea and vomiting after your treatment, we encourage you to take your nausea medication as needed.     If you develop nausea and vomiting that is not controlled by your nausea medication, call the clinic. If it is after clinic hours your family physician or the after hours number for the clinic or go to the Emergency Department.   BELOW ARE SYMPTOMS THAT SHOULD BE REPORTED IMMEDIATELY:  *FEVER GREATER THAN 100.5 F  *CHILLS WITH OR WITHOUT FEVER  NAUSEA AND VOMITING THAT IS NOT CONTROLLED WITH YOUR NAUSEA MEDICATION  *UNUSUAL SHORTNESS OF BREATH  *UNUSUAL BRUISING OR BLEEDING  TENDERNESS IN MOUTH AND THROAT WITH OR WITHOUT PRESENCE OF ULCERS  *URINARY PROBLEMS  *BOWEL PROBLEMS  UNUSUAL RASH Items with * indicate a potential emergency and should be followed up as soon as possible.  One of the nurses will contact you 24 hours after your treatment. Please let the nurse know about any problems that you may have experienced. Feel free to call the clinic you have any questions or concerns. The clinic phone number is [phone].   I have been informed and understand all the instructions given to me. I know to contact the clinic, my physician, or go to the Emergency Department if any problems should occur. I do not have any questions at this time, but understand that I may call the clinic during office hours   should I have any questions or need assistance in obtaining follow up care.    __________________________________________  _____________  __________ Signature of Patient or Authorized Representative            Date                   Time    __________________________________________ Nurse's Signature

## 2012-05-21 NOTE — Telephone Encounter (Signed)
Per staff message and POF I have scheduled appts.  [name]  

## 2012-05-23 ENCOUNTER — Telehealth: Payer: Self-pay | Admitting: *Deleted

## 2012-05-23 NOTE — Telephone Encounter (Signed)
Pt called wanting to see if it was okay for her orthopaedic MD to give her a cortisone shot.  Per Dr Donnald Garre, okay for pt to take cortisone shot.  SLJ

## 2012-06-04 ENCOUNTER — Other Ambulatory Visit: Payer: Self-pay | Admitting: Physician Assistant

## 2012-06-04 DIAGNOSIS — C3490 Malignant neoplasm of unspecified part of unspecified bronchus or lung: Secondary | ICD-10-CM

## 2012-06-04 DIAGNOSIS — C7951 Secondary malignant neoplasm of bone: Secondary | ICD-10-CM

## 2012-06-04 DIAGNOSIS — C801 Malignant (primary) neoplasm, unspecified: Secondary | ICD-10-CM

## 2012-06-17 ENCOUNTER — Telehealth: Payer: Self-pay | Admitting: Internal Medicine

## 2012-06-17 ENCOUNTER — Telehealth: Payer: Self-pay | Admitting: *Deleted

## 2012-06-17 ENCOUNTER — Ambulatory Visit (HOSPITAL_BASED_OUTPATIENT_CLINIC_OR_DEPARTMENT_OTHER): Payer: Medicare Other | Admitting: Physician Assistant

## 2012-06-17 ENCOUNTER — Ambulatory Visit (HOSPITAL_BASED_OUTPATIENT_CLINIC_OR_DEPARTMENT_OTHER): Payer: Medicare Other

## 2012-06-17 ENCOUNTER — Other Ambulatory Visit (HOSPITAL_BASED_OUTPATIENT_CLINIC_OR_DEPARTMENT_OTHER): Payer: Medicare Other | Admitting: Lab

## 2012-06-17 VITALS — BP 127/76 | HR 58 | Temp 97.8°F | Resp 20 | Ht 65.0 in | Wt 146.1 lb

## 2012-06-17 DIAGNOSIS — C3410 Malignant neoplasm of upper lobe, unspecified bronchus or lung: Secondary | ICD-10-CM

## 2012-06-17 DIAGNOSIS — Z5111 Encounter for antineoplastic chemotherapy: Secondary | ICD-10-CM

## 2012-06-17 DIAGNOSIS — C7951 Secondary malignant neoplasm of bone: Secondary | ICD-10-CM

## 2012-06-17 DIAGNOSIS — C3490 Malignant neoplasm of unspecified part of unspecified bronchus or lung: Secondary | ICD-10-CM

## 2012-06-17 DIAGNOSIS — C7952 Secondary malignant neoplasm of bone marrow: Secondary | ICD-10-CM

## 2012-06-17 LAB — CBC WITH DIFFERENTIAL/PLATELET
Basophils Absolute: 0 10*3/uL (ref 0.0–0.1)
EOS%: 1.2 % (ref 0.0–7.0)
LYMPH%: 43.1 % (ref 14.0–49.7)
MCH: 33.1 pg (ref 25.1–34.0)
MCV: 98.9 fL (ref 79.5–101.0)
MONO%: 9.7 % (ref 0.0–14.0)
RBC: 3.57 10*6/uL — ABNORMAL LOW (ref 3.70–5.45)
RDW: 13.4 % (ref 11.2–14.5)
nRBC: 0 % (ref 0–0)

## 2012-06-17 LAB — COMPREHENSIVE METABOLIC PANEL (CC13)
Alkaline Phosphatase: 70 U/L (ref 40–150)
Creatinine: 1.1 mg/dL (ref 0.6–1.1)
Glucose: 94 mg/dl (ref 70–99)
Sodium: 139 mEq/L (ref 136–145)
Total Bilirubin: 0.6 mg/dL (ref 0.20–1.20)
Total Protein: 7.3 g/dL (ref 6.4–8.3)

## 2012-06-17 MED ORDER — ZOLPIDEM TARTRATE 5 MG PO TABS: 5.0000 mg | ORAL_TABLET | Freq: Every evening | ORAL | Status: DC | PRN

## 2012-06-17 MED ORDER — ZOLEDRONIC ACID 4 MG/5ML IV CONC
4.0000 mg | Freq: Once | INTRAVENOUS | Status: AC
  Administered 2012-06-17: 4 mg via INTRAVENOUS
  Filled 2012-06-17: qty 5

## 2012-06-17 MED ORDER — DEXAMETHASONE SODIUM PHOSPHATE 10 MG/ML IJ SOLN
10.0000 mg | Freq: Once | INTRAMUSCULAR | Status: AC
  Administered 2012-06-17: 10 mg via INTRAVENOUS

## 2012-06-17 MED ORDER — SODIUM CHLORIDE 0.9 % IV SOLN
500.0000 mg/m2 | Freq: Once | INTRAVENOUS | Status: AC
  Administered 2012-06-17: 875 mg via INTRAVENOUS
  Filled 2012-06-17: qty 35

## 2012-06-17 MED ORDER — ONDANSETRON 8 MG/50ML IVPB (CHCC)
8.0000 mg | Freq: Once | INTRAVENOUS | Status: AC
  Administered 2012-06-17: 8 mg via INTRAVENOUS

## 2012-06-17 NOTE — Patient Instructions (Addendum)
Begin taking Ambien 5 mg by mouth at bedtime as needed for insomnia Follow up with Dr. Arbutus Ped in one month with a restaging CT scan of your chest, abdomen and pelvis

## 2012-06-17 NOTE — Telephone Encounter (Signed)
Per staff message and POF I have scheduled appts.  [name]  

## 2012-06-17 NOTE — Telephone Encounter (Signed)
Gave pt appt for October 2013 Ct then see md with labs and chemo on October 2014, gave pt oral contrast and NPO 4 hours prior to CT

## 2012-06-17 NOTE — Patient Instructions (Addendum)
[facility] Discharge Instructions for Patients Receiving Chemotherapy  Today you received the following chemotherapy agents Alimta, Zometa  To help prevent nausea and vomiting after your treatment, we encourage you to take your nausea medication    If you develop nausea and vomiting that is not controlled by your nausea medication, call the clinic.   BELOW ARE SYMPTOMS THAT SHOULD BE REPORTED IMMEDIATELY:  *FEVER GREATER THAN 100.5 F  *CHILLS WITH OR WITHOUT FEVER  NAUSEA AND VOMITING THAT IS NOT CONTROLLED WITH YOUR NAUSEA MEDICATION  *UNUSUAL SHORTNESS OF BREATH  *UNUSUAL BRUISING OR BLEEDING  TENDERNESS IN MOUTH AND THROAT WITH OR WITHOUT PRESENCE OF ULCERS  *URINARY PROBLEMS  *BOWEL PROBLEMS  UNUSUAL RASH Items with * indicate a potential emergency and should be followed up as soon as possible.  Feel free to call the clinic you have any questions or concerns. The clinic phone number is [phone].    

## 2012-06-17 NOTE — Telephone Encounter (Signed)
Talked to patient gave her appt for October 2013 lab, ML and chemo

## 2012-06-18 ENCOUNTER — Ambulatory Visit: Payer: Medicare Other | Admitting: Physician Assistant

## 2012-06-22 NOTE — Progress Notes (Addendum)
[facility] [phone]   [phone]  OFFICE PROGRESS NOTE  [name], MD [address] [facility] 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   PRIOR THERAPY:  1. Status post left upper lobectomy with mediastinal lymph node dissection under the care of Dr. Dorris Fetch on December 10, 2007.  2. Status post 4 cycles of adjuvant chemotherapy with cisplatin and Taxotere. Last dose was given 03/22/2008.  3. Status post 6 cycles of systemic chemotherapy with carboplatin and Alimta for metastatic disease in the bone. The last dose was given July 02, 2010, with stable disease.  4. Status post 12 cycles of maintenance Alimta at 500 mg/sq m given every 3 weeks. The last dose was given 05/08/2011.  CURRENT THERAPY:  1. Maintenance Alimta at 500 mg/sq m given every 4 weeks. The patient is status post 14 cycles.  2. Zometa 4 mg IV given every 2 months for bone metastasis.   INTERVAL HISTORY: Tamara Ortiz 71 y.o. female returns to the clinic today for routine followup visit. She complains of right side pain that she notices primarily at night. She states feels like she is cracked ribs although she denies any falls. She reports that she has had a cortisone shot in her knee and this has helped her knee pain tremendously. She complains of not resting well at night secondary to pain. The Ativan is not helping her rest as well as it used to. She reports that she missed her Zometa infusion that was due in August. She voiced no other complaints today. She is tolerating her maintenance chemotherapy with single agent Alimta relatively well.  She denied having any significant chest pain, shortness breath, cough or hemoptysis. She denied having any significant weight  loss or night sweats.  MEDICAL HISTORY: Past Medical History  Diagnosis Date  . Hypertension   . HYPERLIPIDEMIA 11/05/2007  . ANXIETY 05/13/2009  . DEPRESSIVE DISORDER 05/06/2008  . HYPERTENSION 11/05/2007  . ALLERGIC RHINITIS 11/05/2007  . CONSTIPATION, CHRONIC 05/13/2009  . UTI 05/06/2008  . BACK PAIN 05/11/2008  . OSTEOPOROSIS 11/05/2007  . INSOMNIA-SLEEP DISORDER-UNSPEC 05/13/2009  . FATIGUE 05/13/2009  . Swelling, mass, or lump in chest 11/05/2007  . LUNG CANCER, HX OF 05/06/2008  . Cancer     lung ca    ALLERGIES:  is allergic to amoxicillin.  MEDICATIONS:  Current Outpatient Prescriptions  Medication Sig Dispense Refill  . aspirin 81 MG EC tablet Take 81 mg by mouth daily.        . Calcium Carbonate-Vitamin D (CALCIUM-VITAMIN D) 500-200 MG-UNIT per tablet Take 1 tablet by mouth 2 (two) times daily.        . Coenzyme Q10 (COQ10) 100 MG CAPS Take 100 mg by mouth daily.        . Flaxseed, Linseed, (FLAXSEED OIL) 1000 MG CAPS Take 1,000 mg by mouth daily.        . folic acid (FOLVITE) 1 MG tablet Take 1 tablet (1 mg total) by mouth daily.  90 tablet  1  . HYDROcodone-acetaminophen (NORCO) 10-325 MG per tablet       . LORazepam (ATIVAN) 0.5 MG tablet TAKE 1 TABLET BY MOUTH EVERY 8 HOURS AS NEEDED FOR ANXIETY  40 tablet  0  . losartan-hydrochlorothiazide (HYZAAR) 50-12.5  MG per tablet Take 1 tablet by mouth daily.  90 tablet  2  . Multiple Vitamin (ANTIOXIDANT A/C/E PO) Take by mouth daily.        . Omega-3 350 MG CAPS Take by mouth daily.        . polyethylene glycol powder (MIRALAX) powder Take 17 g by mouth daily.        . Probiotic Product (PROBIOTIC FORMULA) CAPS Take 1 capsule by mouth daily.        . Red Yeast Rice 600 MG CAPS Take 600 mg by mouth 2 (two) times daily.        . simvastatin (ZOCOR) 40 MG tablet Take 1 tablet (40 mg total) by mouth every evening.  90 tablet  2  . vitamin B-12 (CYANOCOBALAMIN) 100 MCG tablet Take 50 mcg by mouth daily.        . vitamin E ([employer] VITAMIN E) 400  UNIT capsule Take 400 Units by mouth daily.        [name] zolendronic acid (ZOMETA) 4 MG/5ML injection Inject 4 mg into the vein once.        [name] zolpidem (AMBIEN) 5 MG tablet Take 1 tablet (5 mg total) by mouth at bedtime as needed for sleep.  30 tablet  0    SURGICAL HISTORY:  Past Surgical History  Procedure Date  . Tubal ligation   . S/p lul lung surgury 12/2007  . Lobectomy     REVIEW OF SYSTEMS:  A comprehensive review of systems was negative except for: Constitutional: positive for Insomnia Musculoskeletal: positive for arthralgias and Right side pain/rib cage pain   PHYSICAL EXAMINATION: General appearance: alert, cooperative and no distress Head: Normocephalic, without obvious abnormality, atraumatic Neck: no adenopathy Lymph nodes: Cervical, supraclavicular, and axillary nodes normal. Resp: clear to auscultation bilaterally Back: negative, symmetric, no curvature. ROM normal. No CVA tenderness. Cardio: regular rate and rhythm, S1, S2 normal, no murmur, click, rub or gallop GI: soft, non-tender; bowel sounds normal; no masses,  no organomegaly Extremities: extremities normal, atraumatic, no cyanosis or edema Neurologic: Alert and oriented X 3, normal strength and tone. Normal symmetric reflexes. Normal coordination and gait No areas of point tenderness on examination of the right lateral rib cage  ECOG PERFORMANCE STATUS: 1 - Symptomatic but completely ambulatory  Blood pressure 127/76, pulse 58, temperature 97.8 F (36.6 C), resp. rate 20, height 5\' 5"  (1.651 m), weight 146 lb 1.6 oz (66.271 kg).  LABORATORY DATA: Lab Results  Component Value Date   WBC 3.4* 06/17/2012   HGB 11.8 06/17/2012   HCT 35.3 06/17/2012   MCV 98.9 06/17/2012   PLT 259 06/17/2012      Chemistry      Component Value Date/Time   NA 139 06/17/2012 0945   NA 137 05/21/2012 0910   NA 140 04/17/2012 0855   K 4.0 06/17/2012 0945   K 4.1 05/21/2012 0910   K 4.1 04/17/2012 0855   CL 101 06/17/2012 0945   CL  102 05/21/2012 0910   CL 99 04/17/2012 0855   CO2 29 06/17/2012 0945   CO2 27 05/21/2012 0910   CO2 29 04/17/2012 0855   BUN 16.0 06/17/2012 0945   BUN 20 05/21/2012 0910   BUN 15 04/17/2012 0855   CREATININE 1.1 06/17/2012 0945   CREATININE 1.16* 05/21/2012 0910   CREATININE 1.4* 04/17/2012 0855      Component Value Date/Time   CALCIUM 10.6* 06/17/2012 0945   CALCIUM 9.9 05/21/2012 0910   CALCIUM  9.5 04/17/2012 0855   ALKPHOS 70 06/17/2012 0945   ALKPHOS 63 05/21/2012 0910   ALKPHOS 68 04/17/2012 0855   AST 24 06/17/2012 0945   AST 19 05/21/2012 0910   AST 29 04/17/2012 0855   ALT 22 06/17/2012 0945   ALT 16 05/21/2012 0910   BILITOT 0.60 06/17/2012 0945   BILITOT 0.4 05/21/2012 0910   BILITOT 0.60 04/17/2012 0855       RADIOGRAPHIC STUDIES: Nm Bone Scan Whole Body  05/20/2012  *RADIOLOGY REPORT*  Clinical Data: Lung cancer with bone metastases.  Left knee pain.  NUCLEAR MEDICINE WHOLE BODY BONE SCINTIGRAPHY  Technique:  Whole body anterior and posterior images were obtained approximately 3 hours after intravenous injection of radiopharmaceutical.  Radiopharmaceutical: CURIE TC-MDP TECHNETIUM TC 29M MEDRONATE IV KIT  Comparison: CT chest abdomen pelvis 04/17/2012 and bone scan 07/13/2009. PET 11/15/2007.  Findings: Foci of uptake are seen in the mid thoracic spine, lower sacrum and left iliac wing, adjacent to the sacroiliac joint. These correspond to sclerotic lesions on comparison examination of 04/17/2012.  Degenerative uptake is seen at L5-S1, where there is prominent facet hypertrophy.  Mild degenerative uptake in the knees.  IMPRESSION: Foci of uptake in the mid thoracic spine, lower sacrum and left iliac wing correspond to sclerotic lesions on comparison examination of 04/17/2012, favoring metastatic disease.   Original Report Authenticated By: Reyes Ivan, M.D.     ASSESSMENT/PLAN: This is a very pleasant 71 years old white female with metastatic non-small cell lung cancer currently  on maintenance and Alimta as well as Zometa on a monthly basis. The patient is doing fine and tolerating her treatment fairly well except for arthralgias which could be related in part to with the Zometa treatment. The patient was discussed with Dr. Arbutus Ped. She will be given her Zometa infusion today and then be scheduled every 2 months going forward. She'll proceed with her scheduled maintenance chemotherapy with single agent Alimta today. She'll followup with Dr. Arbutus Ped in one month with a repeat CBC differential, C. met and CT of the chest abdomen and pelvis with contrast to reevaluate her disease. She is to continue on her supplemental calcium and vitamin D and to maintain good dental hygiene. As she is due for restaging CT scans we'll see if there any new bone lesions in the right rib region. For her insomnia the patient will be given a trial of Ambien 5 mg at bedtime as needed for insomnia a total of 30 tablets with no refill.  Laural Benes, Tonyia Marschall E, PA-C   All questions were answered. The patient knows to call the clinic with any problems, questions or concerns. We can certainly see the patient much sooner if necessary.  I spent 20 minutes counseling the patient face to face. The total time spent in the appointment was 30 minutes.

## 2012-06-28 ENCOUNTER — Other Ambulatory Visit: Payer: Self-pay | Admitting: Internal Medicine

## 2012-06-28 ENCOUNTER — Other Ambulatory Visit: Payer: Self-pay | Admitting: Physician Assistant

## 2012-06-28 DIAGNOSIS — C3490 Malignant neoplasm of unspecified part of unspecified bronchus or lung: Secondary | ICD-10-CM

## 2012-07-04 ENCOUNTER — Telehealth: Payer: Self-pay | Admitting: Medical Oncology

## 2012-07-04 NOTE — Telephone Encounter (Signed)
Tamara Ortiz reports new onset right sided pain since 06/17/12. Sometimes it radiates to her back and left side. It is getting worse and keeps her awake at night. She describes it as "it  feels  like a cracked rib". I told her to keep her appts as scheduled and to take her pain med prn.

## 2012-07-08 ENCOUNTER — Ambulatory Visit (HOSPITAL_COMMUNITY)
Admission: RE | Admit: 2012-07-08 | Discharge: 2012-07-08 | Disposition: A | Discharge status: Home / Self Care | Payer: Medicare Other | Source: Ambulatory Visit | Attending: Physician Assistant | Admitting: Physician Assistant

## 2012-07-08 DIAGNOSIS — C3490 Malignant neoplasm of unspecified part of unspecified bronchus or lung: Secondary | ICD-10-CM | POA: Insufficient documentation

## 2012-07-08 DIAGNOSIS — K449 Diaphragmatic hernia without obstruction or gangrene: Secondary | ICD-10-CM | POA: Insufficient documentation

## 2012-07-08 DIAGNOSIS — M948X9 Other specified disorders of cartilage, unspecified sites: Secondary | ICD-10-CM | POA: Insufficient documentation

## 2012-07-08 MED ORDER — IOHEXOL 300 MG/ML  SOLN
100.0000 mL | Freq: Once | INTRAMUSCULAR | Status: AC | PRN
  Administered 2012-07-08: 100 mL via INTRAVENOUS

## 2012-07-15 ENCOUNTER — Ambulatory Visit (HOSPITAL_BASED_OUTPATIENT_CLINIC_OR_DEPARTMENT_OTHER): Payer: Medicare Other | Admitting: Internal Medicine

## 2012-07-15 ENCOUNTER — Ambulatory Visit (HOSPITAL_BASED_OUTPATIENT_CLINIC_OR_DEPARTMENT_OTHER): Payer: Medicare Other

## 2012-07-15 ENCOUNTER — Telehealth: Payer: Self-pay | Admitting: Internal Medicine

## 2012-07-15 ENCOUNTER — Other Ambulatory Visit (HOSPITAL_BASED_OUTPATIENT_CLINIC_OR_DEPARTMENT_OTHER): Payer: Medicare Other | Admitting: Lab

## 2012-07-15 VITALS — BP 122/71 | HR 65 | Temp 98.4°F | Resp 20 | Ht 65.0 in | Wt 146.0 lb

## 2012-07-15 DIAGNOSIS — C3490 Malignant neoplasm of unspecified part of unspecified bronchus or lung: Secondary | ICD-10-CM | POA: Insufficient documentation

## 2012-07-15 DIAGNOSIS — C3410 Malignant neoplasm of upper lobe, unspecified bronchus or lung: Secondary | ICD-10-CM

## 2012-07-15 DIAGNOSIS — C7951 Secondary malignant neoplasm of bone: Secondary | ICD-10-CM

## 2012-07-15 DIAGNOSIS — R079 Chest pain, unspecified: Secondary | ICD-10-CM

## 2012-07-15 DIAGNOSIS — Z5111 Encounter for antineoplastic chemotherapy: Secondary | ICD-10-CM

## 2012-07-15 LAB — COMPREHENSIVE METABOLIC PANEL (CC13)
ALT: 19 U/L (ref 0–55)
AST: 23 U/L (ref 5–34)
Albumin: 3.9 g/dL (ref 3.5–5.0)
Alkaline Phosphatase: 72 U/L (ref 40–150)
BUN: 13 mg/dL (ref 7.0–26.0)
CO2: 25 mEq/L (ref 22–29)
Calcium: 9.9 mg/dL (ref 8.4–10.4)
Creatinine: 1 mg/dL (ref 0.6–1.1)
Glucose: 93 mg/dl (ref 70–99)
Sodium: 139 mEq/L (ref 136–145)
Total Bilirubin: 0.5 mg/dL (ref 0.20–1.20)
Total Protein: 6.6 g/dL (ref 6.4–8.3)

## 2012-07-15 LAB — CBC WITH DIFFERENTIAL/PLATELET
Basophils Absolute: 0.1 10*3/uL (ref 0.0–0.1)
Eosinophils Absolute: 0 10*3/uL (ref 0.0–0.5)
HCT: 37 % (ref 34.8–46.6)
LYMPH%: 28.5 % (ref 14.0–49.7)
MCV: 98.1 fL (ref 79.5–101.0)
MONO#: 0.4 10*3/uL (ref 0.1–0.9)
MONO%: 6.9 % (ref 0.0–14.0)
NEUT#: 3.4 10*3/uL (ref 1.5–6.5)
NEUT%: 63.1 % (ref 38.4–76.8)
Platelets: 263 10*3/uL (ref 145–400)
RBC: 3.77 10*6/uL (ref 3.70–5.45)

## 2012-07-15 MED ORDER — SODIUM CHLORIDE 0.9 % IV SOLN
500.0000 mg/m2 | Freq: Once | INTRAVENOUS | Status: AC
  Administered 2012-07-15: 875 mg via INTRAVENOUS
  Filled 2012-07-15: qty 35

## 2012-07-15 MED ORDER — ONDANSETRON 8 MG/50ML IVPB (CHCC)
8.0000 mg | Freq: Once | INTRAVENOUS | Status: AC
  Administered 2012-07-15: 8 mg via INTRAVENOUS

## 2012-07-15 MED ORDER — SODIUM CHLORIDE 0.9 % IV SOLN
Freq: Once | INTRAVENOUS | Status: AC
  Administered 2012-07-15: 11:00:00 via INTRAVENOUS

## 2012-07-15 MED ORDER — DEXAMETHASONE SODIUM PHOSPHATE 10 MG/ML IJ SOLN
10.0000 mg | Freq: Once | INTRAMUSCULAR | Status: AC
  Administered 2012-07-15: 10 mg via INTRAVENOUS

## 2012-07-15 NOTE — Patient Instructions (Signed)
The scan showed no evidence for disease progression. Continue chemotherapy with the same regimen. Followup in one month.

## 2012-07-15 NOTE — Telephone Encounter (Signed)
gv pt appt schedule for November and December.  °

## 2012-07-15 NOTE — Patient Instructions (Signed)
[facility] Discharge Instructions for Patients Receiving Chemotherapy  Today you received the following chemotherapy agents Alimta To help prevent nausea and vomiting after your treatment, we encourage you to take your nausea medication as prescribed.  If you develop nausea and vomiting that is not controlled by your nausea medication, call the clinic. If it is after clinic hours your family physician or the after hours number for the clinic or go to the Emergency Department.   BELOW ARE SYMPTOMS THAT SHOULD BE REPORTED IMMEDIATELY:  *FEVER GREATER THAN 100.5 F  *CHILLS WITH OR WITHOUT FEVER  NAUSEA AND VOMITING THAT IS NOT CONTROLLED WITH YOUR NAUSEA MEDICATION  *UNUSUAL SHORTNESS OF BREATH  *UNUSUAL BRUISING OR BLEEDING  TENDERNESS IN MOUTH AND THROAT WITH OR WITHOUT PRESENCE OF ULCERS  *URINARY PROBLEMS  *BOWEL PROBLEMS  UNUSUAL RASH Items with * indicate a potential emergency and should be followed up as soon as possible.  One of the nurses will contact you 24 hours after your treatment. Please let the nurse know about any problems that you may have experienced. Feel free to call the clinic you have any questions or concerns. The clinic phone number is [phone].   I have been informed and understand all the instructions given to me. I know to contact the clinic, my physician, or go to the Emergency Department if any problems should occur. I do not have any questions at this time, but understand that I may call the clinic during office hours   should I have any questions or need assistance in obtaining follow up care.    __________________________________________  _____________  __________ Signature of Patient or Authorized Representative            Date                   Time    __________________________________________ Nurse's Signature    

## 2012-07-15 NOTE — Progress Notes (Signed)
[facility] [phone]   [phone]  OFFICE PROGRESS NOTE  Tamara Barre, MD [address] [facility] [address] [place] [address] [place] [address]  DIAGNOSIS: Metastatic non-small cell lung cancer initially diagnosed as stage IIB (T2 N1 M0) adenocarcinoma with bronchoalveolar features in June 2009 and the patient has EGFR mutation exon 21 at the surgical specimen.   PRIOR THERAPY:  1. Status post left upper lobectomy with mediastinal lymph node dissection under the care of Dr. Dorris Fetch on December 10, 2007.  2. Status post 4 cycles of adjuvant chemotherapy with cisplatin and Taxotere. Last dose was given 03/22/2008.  3. Status post 6 cycles of systemic chemotherapy with carboplatin and Alimta for metastatic disease in the bone. The last dose was given July 02, 2010, with stable disease.  4. Status post 12 cycles of maintenance Alimta at 500 mg/sq m given every 3 weeks. The last dose was given 05/08/2011.  CURRENT THERAPY:  1. Maintenance Alimta at 500 mg/sq m given every 4 weeks. The patient is status post 14 cycles.  2. Zometa 4 mg IV given every 2 months for bone metastasis.   INTERVAL HISTORY: Tamara Ortiz 71 y.o. female returns to the clinic today for followup visit. The patient is feeling fine today except for occasional pain on the right side of the chest at the previous surgical scar. The patient denied having any significant shortness breath, cough or hemoptysis. She denied having any significant back pain. She denied having any significant weight loss or night sweats. She is tolerating her treatment with maintenance Alimta fairly well with no significant adverse effects. The patient has repeat CT scan of the chest, abdomen and pelvis performed recently and she is here for evaluation and discussion of her scan results.  MEDICAL HISTORY: Past Medical History  Diagnosis Date  . Hypertension   . HYPERLIPIDEMIA 11/05/2007  . ANXIETY 05/13/2009  .  DEPRESSIVE DISORDER 05/06/2008  . HYPERTENSION 11/05/2007  . ALLERGIC RHINITIS 11/05/2007  . CONSTIPATION, CHRONIC 05/13/2009  . UTI 05/06/2008  . BACK PAIN 05/11/2008  . OSTEOPOROSIS 11/05/2007  . INSOMNIA-SLEEP DISORDER-UNSPEC 05/13/2009  . FATIGUE 05/13/2009  . Swelling, mass, or lump in chest 11/05/2007  . LUNG CANCER, HX OF 05/06/2008  . Cancer     lung ca    ALLERGIES:  is allergic to amoxicillin.  MEDICATIONS:  Current Outpatient Prescriptions  Medication Sig Dispense Refill  . aspirin 81 MG EC tablet Take 81 mg by mouth daily.        . Calcium Carbonate-Vitamin D (CALCIUM-VITAMIN D) 500-200 MG-UNIT per tablet Take 1 tablet by mouth 2 (two) times daily.        . Coenzyme Q10 (COQ10) 100 MG CAPS Take 100 mg by mouth daily.        . Flaxseed, Linseed, (FLAXSEED OIL) 1000 MG CAPS Take 1,000 mg by mouth daily.        . folic acid (FOLVITE) 1 MG tablet TAKE 1 TABLET BY MOUTH EVERY DAY  90 tablet  0  . HYDROcodone-acetaminophen (NORCO) 10-325 MG per tablet       . LORazepam (ATIVAN) 0.5 MG tablet TAKE 1 TABLET BY MOUTH EVERY 8 HOURS AS NEEDED FOR ANXIETY  40 tablet  1  . losartan-hydrochlorothiazide (HYZAAR) 50-12.5 MG per tablet Take 1 tablet by mouth daily.  90 tablet  2  . Multiple Vitamin (ANTIOXIDANT A/C/E PO) Take by mouth daily.        . Omega-3 350  MG CAPS Take by mouth daily.        . polyethylene glycol powder (MIRALAX) powder Take 17 g by mouth daily.        . Probiotic Product (PROBIOTIC FORMULA) CAPS Take 1 capsule by mouth daily.        . Red Yeast Rice 600 MG CAPS Take 600 mg by mouth 2 (two) times daily.        . simvastatin (ZOCOR) 40 MG tablet Take 1 tablet (40 mg total) by mouth every evening.  90 tablet  2  . vitamin B-12 (CYANOCOBALAMIN) 100 MCG tablet Take 50 mcg by mouth daily.        . vitamin E (CVS VITAMIN E) 400 UNIT capsule Take 400 Units by mouth daily.        [name] zolendronic acid (ZOMETA) 4 MG/5ML injection Inject 4 mg into the vein once.        [name] zolpidem (AMBIEN) 5  MG tablet Take 1 tablet (5 mg total) by mouth at bedtime as needed for sleep.  30 tablet  0    SURGICAL HISTORY:  Past Surgical History  Procedure Date  . Tubal ligation   . S/p lul lung surgury 12/2007  . Lobectomy     REVIEW OF SYSTEMS:  A comprehensive review of systems was negative.   PHYSICAL EXAMINATION: General appearance: alert, cooperative and no distress Head: Normocephalic, without obvious abnormality, atraumatic Neck: no adenopathy Lymph nodes: Cervical, supraclavicular, and axillary nodes normal. Resp: clear to auscultation bilaterally Cardio: regular rate and rhythm, S1, S2 normal, no murmur, click, rub or gallop GI: soft, non-tender; bowel sounds normal; no masses,  no organomegaly Extremities: extremities normal, atraumatic, no cyanosis or edema Neurologic: Alert and oriented X 3, normal strength and tone. Normal symmetric reflexes. Normal coordination and gait  ECOG PERFORMANCE STATUS: 0 - Asymptomatic  Blood pressure 122/71, pulse 65, temperature 98.4 F (36.9 C), temperature source Oral, resp. rate 20, height 5\' 5"  (1.651 m), weight 146 lb (66.225 kg).  LABORATORY DATA: Lab Results  Component Value Date   WBC 5.4 07/15/2012   HGB 12.6 07/15/2012   HCT 37.0 07/15/2012   MCV 98.1 07/15/2012   PLT 263 07/15/2012      Chemistry      Component Value Date/Time   NA 139 06/17/2012 0945   NA 137 05/21/2012 0910   NA 140 04/17/2012 0855   K 4.0 06/17/2012 0945   K 4.1 05/21/2012 0910   K 4.1 04/17/2012 0855   CL 101 06/17/2012 0945   CL 102 05/21/2012 0910   CL 99 04/17/2012 0855   CO2 29 06/17/2012 0945   CO2 27 05/21/2012 0910   CO2 29 04/17/2012 0855   BUN 16.0 06/17/2012 0945   BUN 20 05/21/2012 0910   BUN 15 04/17/2012 0855   CREATININE 1.1 06/17/2012 0945   CREATININE 1.16* 05/21/2012 0910   CREATININE 1.4* 04/17/2012 0855      Component Value Date/Time   CALCIUM 10.6* 06/17/2012 0945   CALCIUM 9.9 05/21/2012 0910   CALCIUM 9.5 04/17/2012 0855   ALKPHOS 70  06/17/2012 0945   ALKPHOS 63 05/21/2012 0910   ALKPHOS 68 04/17/2012 0855   AST 24 06/17/2012 0945   AST 19 05/21/2012 0910   AST 29 04/17/2012 0855   ALT 22 06/17/2012 0945   ALT 16 05/21/2012 0910   BILITOT 0.60 06/17/2012 0945   BILITOT 0.4 05/21/2012 0910   BILITOT 0.60 04/17/2012 0855       RADIOGRAPHIC  STUDIES: Ct Chest W Contrast  07/08/2012  *RADIOLOGY REPORT*  Clinical Data:  Restaging lung cancer.  CT CHEST, ABDOMEN AND PELVIS WITH CONTRAST  Technique:  Multidetector CT imaging of the chest, abdomen and pelvis was performed following the standard protocol during bolus administration of intravenous contrast.  Contrast: OMNIPAQUE IOHEXOL 300 MG/ML  SOLN  Comparison:  Multiple prior CT scans.  The most recent is 04/17/2012.  CT CHEST  Findings:  The chest wall is unremarkable and stable.  No breast masses, supraclavicular or axillary lymphadenopathy.  Small stable scattered axillary lymph nodes.  The bony thorax is intact.  There are stable scattered sclerotic bone lesions, likely treated metastasis.  No new lesions and no destructive bony changes.  The heart is normal in size.  No pericardial effusion.  No mediastinal or hilar lymphadenopathy.  Small scattered nodes are stable.  The aorta is normal in caliber.  No dissection.  Coronary artery calcifications are stable.  There is a stable moderate sized hiatal hernia.  Examination of the lung parenchyma demonstrates no acute pulmonary findings.  No pulmonary nodules or mass lesions.  No pleural effusion.  The tracheobronchial tree is unremarkable.  Stable surgical changes from a left upper lobe lobectomy.  IMPRESSION:  1.  No CT findings for recurrent tumor, lymphadenopathy or metastatic pulmonary disease. 2.  Stable moderate sized hiatal hernia.  CT ABDOMEN AND PELVIS  Findings:  The liver is unremarkable.  No focal hepatic lesions or intrahepatic ductal dilatation.  A large gallstone is suspected in the gallbladder.  No common bile duct  dilatation.  The pancreas is normal.  The spleen is normal.  The adrenal glands and kidneys are normal.  The stomach, duodenum, small bowel and colon are unremarkable. No mesenteric or retroperitoneal masses or adenopathy.  Small scattered lymph nodes are stable.  The aorta is normal in caliber. Stable mild atherosclerotic calcifications.  No focal aneurysm or dissection.  The uterus and ovaries are unremarkable.  The bladder is normal. No pelvic mass, adenopathy or free pelvic fluid collections.  Stable scattered sclerotic bone lesions likely reflecting treated metastasis.  IMPRESSION:  1.  Unremarkable and stable CT appearance of the abdomen/pelvis. No findings for new metastatic disease. 2.  Stable scattered sclerotic bone lesions.   Original Report Authenticated By: P. Loralie Champagne, M.D.    Ct Abdomen Pelvis W Contrast  07/08/2012  *RADIOLOGY REPORT*  Clinical Data:  Restaging lung cancer.  CT CHEST, ABDOMEN AND PELVIS WITH CONTRAST  Technique:  Multidetector CT imaging of the chest, abdomen and pelvis was performed following the standard protocol during bolus administration of intravenous contrast.  Contrast: OMNIPAQUE IOHEXOL 300 MG/ML  SOLN  Comparison:  Multiple prior CT scans.  The most recent is 04/17/2012.  CT CHEST  Findings:  The chest wall is unremarkable and stable.  No breast masses, supraclavicular or axillary lymphadenopathy.  Small stable scattered axillary lymph nodes.  The bony thorax is intact.  There are stable scattered sclerotic bone lesions, likely treated metastasis.  No new lesions and no destructive bony changes.  The heart is normal in size.  No pericardial effusion.  No mediastinal or hilar lymphadenopathy.  Small scattered nodes are stable.  The aorta is normal in caliber.  No dissection.  Coronary artery calcifications are stable.  There is a stable moderate sized hiatal hernia.  Examination of the lung parenchyma demonstrates no acute pulmonary findings.  No pulmonary  nodules or mass lesions.  No pleural effusion.  The tracheobronchial tree  is unremarkable.  Stable surgical changes from a left upper lobe lobectomy.  IMPRESSION:  1.  No CT findings for recurrent tumor, lymphadenopathy or metastatic pulmonary disease. 2.  Stable moderate sized hiatal hernia.  CT ABDOMEN AND PELVIS  Findings:  The liver is unremarkable.  No focal hepatic lesions or intrahepatic ductal dilatation.  A large gallstone is suspected in the gallbladder.  No common bile duct dilatation.  The pancreas is normal.  The spleen is normal.  The adrenal glands and kidneys are normal.  The stomach, duodenum, small bowel and colon are unremarkable. No mesenteric or retroperitoneal masses or adenopathy.  Small scattered lymph nodes are stable.  The aorta is normal in caliber. Stable mild atherosclerotic calcifications.  No focal aneurysm or dissection.  The uterus and ovaries are unremarkable.  The bladder is normal. No pelvic mass, adenopathy or free pelvic fluid collections.  Stable scattered sclerotic bone lesions likely reflecting treated metastasis.  IMPRESSION:  1.  Unremarkable and stable CT appearance of the abdomen/pelvis. No findings for new metastatic disease. 2.  Stable scattered sclerotic bone lesions.   Original Report Authenticated By: P. Loralie Champagne, M.D.     ASSESSMENT: This is a very pleasant 71 years old white female with metastatic non-small cell lung cancer, adenocarcinoma. The patient is currently on systemic maintenance chemotherapy with single agent Alimta every 4 weeks status post 15 cycles. The patient is tolerating her treatment fairly well with no significant evidence for disease progression.   PLAN: I discussed the scan results with Tamara Ortiz and recommended for her to continue on maintenance treatment with Alimta at the same dose. She will also continue on Zometa every 2 months. She would come back for followup visit in one month with the next cycle of her chemotherapy.  The  patient was advised to call immediately if she has any concerning symptoms in the interval.  All questions were answered. The patient knows to call the clinic with any problems, questions or concerns. We can certainly see the patient much sooner if necessary.  I spent 15 minutes counseling the patient face to face. The total time spent in the appointment was 25 minutes.

## 2012-07-17 ENCOUNTER — Other Ambulatory Visit (INDEPENDENT_AMBULATORY_CARE_PROVIDER_SITE_OTHER): Payer: Medicare Other

## 2012-07-17 ENCOUNTER — Encounter: Payer: Self-pay | Admitting: Internal Medicine

## 2012-07-17 ENCOUNTER — Ambulatory Visit (INDEPENDENT_AMBULATORY_CARE_PROVIDER_SITE_OTHER): Payer: Medicare Other | Admitting: Internal Medicine

## 2012-07-17 VITALS — BP 102/60 | HR 62 | Temp 98.0°F | Ht 65.0 in | Wt 149.0 lb

## 2012-07-17 DIAGNOSIS — I10 Essential (primary) hypertension: Secondary | ICD-10-CM

## 2012-07-17 DIAGNOSIS — M810 Age-related osteoporosis without current pathological fracture: Secondary | ICD-10-CM

## 2012-07-17 DIAGNOSIS — Z23 Encounter for immunization: Secondary | ICD-10-CM

## 2012-07-17 DIAGNOSIS — E785 Hyperlipidemia, unspecified: Secondary | ICD-10-CM

## 2012-07-17 DIAGNOSIS — F411 Generalized anxiety disorder: Secondary | ICD-10-CM

## 2012-07-17 LAB — LIPID PANEL
HDL: 70.7 mg/dL (ref >39.00)
VLDL: 42.8 mg/dL — ABNORMAL HIGH (ref 0.0–40.0)

## 2012-07-17 LAB — URINALYSIS, ROUTINE W REFLEX MICROSCOPIC
Bilirubin Urine: NEGATIVE
Hgb urine dipstick: NEGATIVE
Ketones, ur: NEGATIVE
Urine Glucose: NEGATIVE
Urobilinogen, UA: 0.2 (ref 0.0–1.0)

## 2012-07-17 MED ORDER — SIMVASTATIN 40 MG PO TABS: 40.0000 mg | ORAL_TABLET | ORAL | Status: DC

## 2012-07-17 MED ORDER — ZOLPIDEM TARTRATE ER 12.5 MG PO TBCR: 12.5000 mg | EXTENDED_RELEASE_TABLET | Freq: Every evening | ORAL | Status: DC | PRN

## 2012-07-17 MED ORDER — LOSARTAN POTASSIUM-HCTZ 50-12.5 MG PO TABS: 1.0000 | ORAL_TABLET | Freq: Every day | ORAL | Status: DC

## 2012-07-17 NOTE — Progress Notes (Signed)
Subjective:    Patient ID: Tamara Ortiz, female    DOB: 26-Feb-1941, 71 y.o.   MRN: [id number]  HPI  Here to f/u; overall doing ok,  Pt denies chest pain, increased sob or doe, wheezing, orthopnea, PND, increased LE swelling, palpitations, dizziness or syncope.  Pt denies new neurological symptoms such as new headache, or facial or extremity weakness or numbness   Pt denies polydipsia, polyuria, or low sugar symptoms such as weakness or confusion improved with po intake.  Pt states overall good compliance with meds, trying to follow lower cholesterol diet, wt overall stable but little exercise however  Has been taking the coq 10 and red yeast rice - would like lipid check today.  Left knee much improved after cortisone shot (dr supple), still has some left knee and then distal leg swelling but not increased pain.  Due for DXA.  Denies worsening depressive symptoms, suicidal ideation, or panic,  Due for flu shot.  Needs med refills, would like ambien change to the CR version to help staying asleep at night Past Medical History  Diagnosis Date  . Hypertension   . HYPERLIPIDEMIA 11/05/2007  . ANXIETY 05/13/2009  . DEPRESSIVE DISORDER 05/06/2008  . HYPERTENSION 11/05/2007  . ALLERGIC RHINITIS 11/05/2007  . CONSTIPATION, CHRONIC 05/13/2009  . UTI 05/06/2008  . BACK PAIN 05/11/2008  . OSTEOPOROSIS 11/05/2007  . INSOMNIA-SLEEP DISORDER-UNSPEC 05/13/2009  . FATIGUE 05/13/2009  . Swelling, mass, or lump in chest 11/05/2007  . LUNG CANCER, HX OF 05/06/2008  . Cancer     lung ca   Past Surgical History  Procedure Date  . Tubal ligation   . S/p lul lung surgury 12/2007  . Lobectomy     reports that she has never smoked. She has never used smokeless tobacco. She reports that she drinks alcohol. She reports that she does not use illicit drugs. family history includes ALS in her father; Alcohol abuse in her son; and Heart disease in her mother. Allergies  Allergen Reactions  . Amoxicillin     REACTION: hives/itch    Current Outpatient Prescriptions on File Prior to Visit  Medication Sig Dispense Refill  . aspirin 81 MG EC tablet Take 81 mg by mouth daily.        . Calcium Carbonate-Vitamin D (CALCIUM-VITAMIN D) 500-200 MG-UNIT per tablet Take 1 tablet by mouth 2 (two) times daily.        . Coenzyme Q10 (COQ10) 100 MG CAPS Take 100 mg by mouth daily.        . Flaxseed, Linseed, (FLAXSEED OIL) 1000 MG CAPS Take 1,000 mg by mouth daily.        . folic acid (FOLVITE) 1 MG tablet TAKE 1 TABLET BY MOUTH EVERY DAY  90 tablet  0  . HYDROcodone-acetaminophen (NORCO) 10-325 MG per tablet       . LORazepam (ATIVAN) 0.5 MG tablet TAKE 1 TABLET BY MOUTH EVERY 8 HOURS AS NEEDED FOR ANXIETY  40 tablet  1  . losartan-hydrochlorothiazide (HYZAAR) 50-12.5 MG per tablet Take 1 tablet by mouth daily.  90 tablet  3  . Multiple Vitamin (ANTIOXIDANT A/C/E PO) Take by mouth daily.        . Omega-3 350 MG CAPS Take by mouth daily.        . polyethylene glycol powder (MIRALAX) powder Take 17 g by mouth daily.        . Probiotic Product (PROBIOTIC FORMULA) CAPS Take 1 capsule by mouth daily.        [name]  Red Yeast Rice 600 MG CAPS Take 600 mg by mouth 2 (two) times daily.        . simvastatin (ZOCOR) 40 MG tablet Take 1 tablet (40 mg total) by mouth every other day.  90 tablet  2  . vitamin B-12 (CYANOCOBALAMIN) 100 MCG tablet Take 50 mcg by mouth daily.        . vitamin E ([employer]S VITAMIN E) 400 UNIT capsule Take 400 Units by mouth daily.        [name] zolendronic acid (ZOMETA) 4 MG/5ML injection Inject 4 mg into the vein once.         Review of Systems  Constitutional: Negative for diaphoresis and unexpected weight change.  HENT: Negative for tinnitus.   Eyes: Negative for photophobia and visual disturbance.  Respiratory: Negative for choking and stridor.   Gastrointestinal: Negative for vomiting and blood in stool.  Genitourinary: Negative for hematuria and decreased urine volume.  Musculoskeletal: Negative for gait problem.  Skin:  Negative for color change and wound.  Neurological: Negative for tremors and numbness.  Psychiatric/Behavioral: Negative for decreased concentration. The patient is not hyperactive.      Objective:   Physical Exam BP 102/60  Pulse 62  Temp 98 F (36.7 C) (Oral)  Ht 5\' 5"  (1.651 m)  Wt 149 lb (67.586 kg)  BMI 24.79 kg/m2  SpO2 96% Physical Exam  VS noted Constitutional: Pt appears well-developed and well-nourished. But on the thin side HENT: Head: Normocephalic.  Right Ear: External ear normal.  Left Ear: External ear normal.  Eyes: Conjunctivae and EOM are normal. Pupils are equal, round, and reactive to light.  Neck: Normal range of motion. Neck supple.  Cardiovascular: Normal rate and regular rhythm.   Pulmonary/Chest: Effort normal and breath sounds normal.  Abd:  Soft, NT, non-distended, + BS Neurological: Pt is alert. Not confused  Skin: Skin is warm. No erythema.  Psychiatric: Pt behavior is normal. Thought content normal.     Assessment & Plan:

## 2012-07-17 NOTE — Patient Instructions (Addendum)
You had the flu shot today Your next bone density would be due in Jan 2014; please schedule with the schedulers as you leave today Your Remus Loffler was changed to the CR 12.5 mg  - done hardcopy Continue all other medications as before Please have the pharmacy call with any refills you may need. Please go to LAB in the Basement for the blood and/or urine tests to be done today You will be contacted by phone if any changes need to be made immediately.  Otherwise, you will receive a letter about your results with an explanation. Please remember to sign up for My Chart at your earliest convenience, as this will be important to you in the future with finding out test results. Please keep your appointments with your specialists as you have planned Please return in 6 mo with Lab testing done 3-5 days before

## 2012-07-18 ENCOUNTER — Encounter: Payer: Self-pay | Admitting: Internal Medicine

## 2012-07-18 LAB — VITAMIN D 25 HYDROXY (VIT D DEFICIENCY, FRACTURES): Vit D, 25-Hydroxy: 72 ng/mL (ref 30–89)

## 2012-07-21 ENCOUNTER — Encounter: Payer: Self-pay | Admitting: Internal Medicine

## 2012-07-21 NOTE — Assessment & Plan Note (Signed)
stable overall by hx and exam, most recent data reviewed with pt, and pt to continue medical treatment as before Lab Results  Component Value Date   LDLCALC 108* 05/13/2009

## 2012-07-21 NOTE — Assessment & Plan Note (Signed)
stable overall by hx and exam, most recent data reviewed with pt, and pt to continue medical treatment as before BP Readings from Last 3 Encounters:  07/17/12 102/60  07/15/12 122/71  06/17/12 127/76

## 2012-07-21 NOTE — Assessment & Plan Note (Signed)
No recent fx, for dxa soon, for vit D, o/w Continue all other medications as before

## 2012-07-21 NOTE — Assessment & Plan Note (Signed)
stable overall by hx and exam, most recent data reviewed with pt, and pt to continue medical treatment as before Lab Results  Component Value Date   WBC 5.4 07/15/2012   HGB 12.6 07/15/2012   HCT 37.0 07/15/2012   PLT 263 07/15/2012   GLUCOSE 93 07/15/2012   CHOL 288* 07/17/2012   TRIG 214.0* 07/17/2012   HDL 70.70 07/17/2012   LDLDIRECT 210.0 07/17/2012   LDLCALC 108* 05/13/2009   ALT 19 07/15/2012   AST 23 07/15/2012   NA 139 07/15/2012   K 3.8 07/15/2012   CL 104 07/15/2012   CREATININE 1.0 07/15/2012   BUN 13.0 07/15/2012   CO2 25 07/15/2012   TSH 3.09 07/17/2012   INR 1.00 01/24/2010   HGBA1C  Value: 5.4 (NOTE)   The ADA recommends the following therapeutic goals for glycemic   control related to Hgb A1C measurement:   Goal of Therapy:   < 7.0% Hgb A1C   Action Suggested:  > 8.0% Hgb A1C   Ref:  Diabetes Care, 22, Suppl. 1, 1999 10/30/2007

## 2012-07-25 ENCOUNTER — Telehealth: Payer: Self-pay | Admitting: Internal Medicine

## 2012-07-25 NOTE — Telephone Encounter (Signed)
Caller: Kassiah/Patient; Patient Name: Tamara Ortiz; PCP: [name] (Adults only); Best Callback Phone Number: [phone] Pt calling to verify Simvastatin dosage. She states new prescription sent in says take every other day. Per EPIC new rx sent 07/17/12 does say take one every other day. Pt reports cholesterol was elevated so she is questioning these directions. Message sent,  please call.

## 2012-07-25 NOTE — Telephone Encounter (Signed)
I believe the 40 qod was done in response to patient concern about side effect, and has been taking red yeast rice recently ;  The new rx at qod was done on purpose as this has worked for others almost as well as daily use to reduce the LDL

## 2012-07-26 NOTE — Telephone Encounter (Signed)
Patient informed of MD instructions. 

## 2012-08-12 ENCOUNTER — Ambulatory Visit: Payer: Medicare Other | Admitting: Physician Assistant

## 2012-08-12 ENCOUNTER — Other Ambulatory Visit: Payer: Medicare Other | Admitting: Lab

## 2012-08-12 ENCOUNTER — Ambulatory Visit (HOSPITAL_BASED_OUTPATIENT_CLINIC_OR_DEPARTMENT_OTHER): Payer: Medicare Other

## 2012-08-12 ENCOUNTER — Other Ambulatory Visit (HOSPITAL_BASED_OUTPATIENT_CLINIC_OR_DEPARTMENT_OTHER): Payer: Medicare Other | Admitting: Lab

## 2012-08-12 ENCOUNTER — Encounter: Payer: Self-pay | Admitting: Physician Assistant

## 2012-08-12 VITALS — BP 109/65 | HR 55 | Temp 98.5°F | Resp 20 | Ht 65.0 in | Wt 146.0 lb

## 2012-08-12 DIAGNOSIS — Z5111 Encounter for antineoplastic chemotherapy: Secondary | ICD-10-CM

## 2012-08-12 DIAGNOSIS — C7951 Secondary malignant neoplasm of bone: Secondary | ICD-10-CM

## 2012-08-12 DIAGNOSIS — C801 Malignant (primary) neoplasm, unspecified: Secondary | ICD-10-CM

## 2012-08-12 DIAGNOSIS — C7952 Secondary malignant neoplasm of bone marrow: Secondary | ICD-10-CM

## 2012-08-12 DIAGNOSIS — C3490 Malignant neoplasm of unspecified part of unspecified bronchus or lung: Secondary | ICD-10-CM

## 2012-08-12 DIAGNOSIS — C3410 Malignant neoplasm of upper lobe, unspecified bronchus or lung: Secondary | ICD-10-CM

## 2012-08-12 LAB — CBC WITH DIFFERENTIAL/PLATELET
Basophils Absolute: 0 10*3/uL (ref 0.0–0.1)
EOS%: 0.5 % (ref 0.0–7.0)
Eosinophils Absolute: 0 10*3/uL (ref 0.0–0.5)
LYMPH%: 33.1 % (ref 14.0–49.7)
MCH: 33.1 pg (ref 25.1–34.0)
MCV: 97.8 fL (ref 79.5–101.0)
MONO%: 8.7 % (ref 0.0–14.0)
Platelets: 238 10*3/uL (ref 145–400)
RBC: 3.66 10*6/uL — ABNORMAL LOW (ref 3.70–5.45)
RDW: 12.2 % (ref 11.2–14.5)
nRBC: 0 % (ref 0–0)

## 2012-08-12 LAB — BASIC METABOLIC PANEL (CC13)
BUN: 14 mg/dL (ref 7.0–26.0)
CO2: 28 mEq/L (ref 22–29)
Chloride: 102 mEq/L (ref 98–107)
Creatinine: 1.1 mg/dL (ref 0.6–1.1)
Potassium: 4 mEq/L (ref 3.5–5.1)

## 2012-08-12 MED ORDER — SODIUM CHLORIDE 0.9 % IV SOLN
500.0000 mg/m2 | Freq: Once | INTRAVENOUS | Status: AC
  Administered 2012-08-12: 875 mg via INTRAVENOUS
  Filled 2012-08-12: qty 35

## 2012-08-12 MED ORDER — ZOLEDRONIC ACID 4 MG/5ML IV CONC
4.0000 mg | Freq: Once | INTRAVENOUS | Status: AC
  Administered 2012-08-12: 4 mg via INTRAVENOUS
  Filled 2012-08-12: qty 5

## 2012-08-12 MED ORDER — ONDANSETRON 8 MG/50ML IVPB (CHCC)
8.0000 mg | Freq: Once | INTRAVENOUS | Status: AC
  Administered 2012-08-12: 8 mg via INTRAVENOUS

## 2012-08-12 MED ORDER — SODIUM CHLORIDE 0.9 % IV SOLN
Freq: Once | INTRAVENOUS | Status: AC
  Administered 2012-08-12: 16:00:00 via INTRAVENOUS

## 2012-08-12 MED ORDER — CYANOCOBALAMIN 1000 MCG/ML IJ SOLN
1000.0000 ug | Freq: Once | INTRAMUSCULAR | Status: AC
  Administered 2012-08-12: 1000 ug via INTRAMUSCULAR

## 2012-08-12 MED ORDER — DEXAMETHASONE SODIUM PHOSPHATE 10 MG/ML IJ SOLN
10.0000 mg | Freq: Once | INTRAMUSCULAR | Status: AC
  Administered 2012-08-12: 10 mg via INTRAVENOUS

## 2012-08-12 NOTE — Patient Instructions (Addendum)
Continue labs and maintenance chemotherapy as scheduled Follow up in 1 month

## 2012-08-12 NOTE — Patient Instructions (Addendum)
[facility] Discharge Instructions for Patients Receiving Chemotherapy  Today you received the following chemotherapy agents Alimta/Zometa To help prevent nausea and vomiting after your treatment, we encourage you to take your nausea medication as prescribed.If you develop nausea and vomiting that is not controlled by your nausea medication, call the clinic. If it is after clinic hours your family physician or the after hours number for the clinic or go to the Emergency Department.   BELOW ARE SYMPTOMS THAT SHOULD BE REPORTED IMMEDIATELY:  *FEVER GREATER THAN 100.5 F  *CHILLS WITH OR WITHOUT FEVER  NAUSEA AND VOMITING THAT IS NOT CONTROLLED WITH YOUR NAUSEA MEDICATION  *UNUSUAL SHORTNESS OF BREATH  *UNUSUAL BRUISING OR BLEEDING  TENDERNESS IN MOUTH AND THROAT WITH OR WITHOUT PRESENCE OF ULCERS  *URINARY PROBLEMS  *BOWEL PROBLEMS  UNUSUAL RASH Items with * indicate a potential emergency and should be followed up as soon as possible.  One of the nurses will contact you 24 hours after your treatment. Please let the nurse know about any problems that you may have experienced. Feel free to call the clinic you have any questions or concerns. The clinic phone number is [phone].   I have been informed and understand all the instructions given to me. I know to contact the clinic, my physician, or go to the Emergency Department if any problems should occur. I do not have any questions at this time, but understand that I may call the clinic during office hours   should I have any questions or need assistance in obtaining follow up care.    __________________________________________  _____________  __________ Signature of Patient or Authorized Representative            Date                   Time    __________________________________________ Nurse's Signature

## 2012-08-13 ENCOUNTER — Telehealth: Payer: Self-pay | Admitting: *Deleted

## 2012-08-13 NOTE — Telephone Encounter (Signed)
Gave patient appointment for 10-07-2012 starting 10:15am  Cancelled treatment for 09-09-2012

## 2012-08-16 NOTE — Progress Notes (Signed)
[facility] [phone]   [phone]  OFFICE PROGRESS NOTE  [name], MD [address] [facility] 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   PRIOR THERAPY:  1. Status post left upper lobectomy with mediastinal lymph node dissection under the care of Dr. Dorris Fetch on December 10, 2007.  2. Status post 4 cycles of adjuvant chemotherapy with cisplatin and Taxotere. Last dose was given 03/22/2008.  3. Status post 6 cycles of systemic chemotherapy with carboplatin and Alimta for metastatic disease in the bone. The last dose was given July 02, 2010, with stable disease.  4. Status post 12 cycles of maintenance Alimta at 500 mg/sq m given every 3 weeks. The last dose was given 05/08/2011.  CURRENT THERAPY:  1. Maintenance Alimta at 500 mg/sq m given every 4 weeks. The patient is status post 16 cycles.  2. Zometa 4 mg IV given every 2 months for bone metastasis.   INTERVAL HISTORY: Tamara Ortiz 71 y.o. female returns to the clinic today for routine followup visit. She states that her primary care physician changed her Ambien to Ambien CR and this has been of tremendous help her in getting a good nights rest. The right side pain that she been complaining about has now resolved. She feels that this may have been secondary to the fact she was not resting well at night. She reports slightly decreased appetite and stating that she wants to eat more sweets but is otherwise doing well. 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 This was evaluated by an audiologist and she states that she is to see an ear nose and throat specialist  soon. She voiced no other specific complaints today.  She is tolerating her maintenance chemotherapy with single agent Alimta relatively well.  She denied having any significant chest pain, shortness breath, cough or hemoptysis. She denied having any significant weight loss or night sweats.  MEDICAL HISTORY: Past Medical History  Diagnosis Date  . Hypertension   . HYPERLIPIDEMIA 11/05/2007  . ANXIETY 05/13/2009  . DEPRESSIVE DISORDER 05/06/2008  . HYPERTENSION 11/05/2007  . ALLERGIC RHINITIS 11/05/2007  . CONSTIPATION, CHRONIC 05/13/2009  . UTI 05/06/2008  . BACK PAIN 05/11/2008  . OSTEOPOROSIS 11/05/2007  . INSOMNIA-SLEEP DISORDER-UNSPEC 05/13/2009  . FATIGUE 05/13/2009  . Swelling, mass, or lump in chest 11/05/2007  . LUNG CANCER, HX OF 05/06/2008  . Cancer     lung ca    ALLERGIES:  is allergic to amoxicillin.  MEDICATIONS:  Current Outpatient Prescriptions  Medication Sig Dispense Refill  . aspirin 81 MG EC tablet Take 81 mg by mouth daily.        . Calcium Carbonate-Vitamin D (CALCIUM-VITAMIN D) 500-200 MG-UNIT per tablet Take 1 tablet by mouth 2 (two) times daily.        . Coenzyme Q10 (COQ10) 100 MG CAPS Take 100 mg by mouth daily.        . Flaxseed, Linseed, (FLAXSEED OIL) 1000 MG CAPS Take 1,000 mg by mouth daily.        . folic acid (FOLVITE) 1 MG tablet TAKE 1 TABLET BY MOUTH EVERY DAY  90 tablet  0  . HYDROcodone-acetaminophen (NORCO) 10-325 MG per tablet       . LORazepam (ATIVAN) 0.5 MG tablet TAKE 1 TABLET BY MOUTH EVERY 8 HOURS AS NEEDED FOR ANXIETY  40 tablet  1  . losartan-hydrochlorothiazide (HYZAAR) 50-12.5 MG per tablet Take 1 tablet by mouth daily.  90 tablet  3  . Multiple Vitamin (ANTIOXIDANT A/C/E PO) Take by mouth daily.        . Omega-3 350 MG CAPS Take by mouth daily.        . polyethylene glycol powder (MIRALAX) powder Take 17 g by mouth daily.        . Probiotic Product (PROBIOTIC FORMULA) CAPS Take 1 capsule by mouth daily.        . Red Yeast Rice 600 MG CAPS Take  600 mg by mouth 2 (two) times daily.        . simvastatin (ZOCOR) 40 MG tablet Take 1 tablet (40 mg total) by mouth every other day.  90 tablet  2  . vitamin B-12 (CYANOCOBALAMIN) 100 MCG tablet Take 50 mcg by mouth daily.        . vitamin E (CVS VITAMIN E) 400 UNIT capsule Take 400 Units by mouth daily.        [name] zolendronic acid (ZOMETA) 4 MG/5ML injection Inject 4 mg into the vein once.        [name] zolpidem (AMBIEN CR) 12.5 MG CR tablet Take 1 tablet (12.5 mg total) by mouth at bedtime as needed for sleep.  30 tablet  5    SURGICAL HISTORY:  Past Surgical History  Procedure Date  . Tubal ligation   . S/p lul lung surgury 12/2007  . Lobectomy     REVIEW OF SYSTEMS:  A comprehensive review of systems was negative except for: Constitutional: positive for Decreased appetite Ears, nose, mouth, throat, and face: positive for hearing loss and Affecting the left ear   PHYSICAL EXAMINATION: General appearance: alert, cooperative and no distress Head: Normocephalic, without obvious abnormality, atraumatic Neck: no adenopathy Lymph nodes: Cervical, supraclavicular, and axillary nodes normal. Resp: clear to auscultation bilaterally Cardio: regular rate and rhythm, S1, S2 normal, no murmur, click, rub or gallop GI: soft, non-tender; bowel sounds normal; no masses,  no organomegaly Extremities: extremities normal, atraumatic, no cyanosis or edema Neurologic: Alert and oriented X 3, normal strength and tone. Normal symmetric reflexes. Normal coordination and gait   ECOG PERFORMANCE STATUS: 1 - Symptomatic but completely ambulatory  Blood pressure 109/65, pulse 55, temperature 98.5 F (36.9 C), temperature source Oral, resp. rate 20, height 5\' 5"  (1.651 m), weight 146 lb (66.225 kg).  LABORATORY DATA: Lab Results  Component Value Date   WBC 6.3 08/12/2012   HGB 12.1 08/12/2012   HCT 35.8 08/12/2012   MCV 97.8 08/12/2012   PLT 238 08/12/2012      Chemistry      Component Value Date/Time     NA 139 08/12/2012 1411   NA 137 05/21/2012 0910   NA 140 04/17/2012 0855   K 4.0 08/12/2012 1411   K 4.1 05/21/2012 0910   K 4.1 04/17/2012 0855   CL 102 08/12/2012 1411   CL 102 05/21/2012 0910   CL 99 04/17/2012 0855   CO2 28 08/12/2012 1411   CO2 27 05/21/2012 0910   CO2 29 04/17/2012 0855   BUN 14.0 08/12/2012 1411   BUN 20 05/21/2012 0910   BUN 15 04/17/2012 0855   CREATININE 1.1 08/12/2012 1411  CREATININE 1.16* 05/21/2012 0910   CREATININE 1.4* 04/17/2012 0855      Component Value Date/Time   CALCIUM 10.4 08/12/2012 1411   CALCIUM 9.9 05/21/2012 0910   CALCIUM 9.5 04/17/2012 0855   ALKPHOS 72 07/15/2012 0922   ALKPHOS 63 05/21/2012 0910   ALKPHOS 68 04/17/2012 0855   AST 23 07/15/2012 0922   AST 19 05/21/2012 0910   AST 29 04/17/2012 0855   ALT 19 07/15/2012 0922   ALT 16 05/21/2012 0910   BILITOT 0.50 07/15/2012 0922   BILITOT 0.4 05/21/2012 0910   BILITOT 0.60 04/17/2012 0855        RADIOGRAPHIC STUDIES:  No results found.       ASSESSMENT/PLAN: This is a very pleasant 71 years old white female with metastatic non-small cell lung cancer currently on maintenance and Alimta as well as Zometa on a monthly basis. The patient is doing fine and tolerating her treatment fairly well.  She had no evidence for disease progression on her most recent CT scan dated 07/08/2012 The patient was discussed with Dr. Arbutus Ped.  She'll proceed with her scheduled maintenance chemotherapy with single agent Alimta today. She is also due for her B12 injection and her Zometa infusion today as well. She'll followup in one month with a repeat CBC differential and C. met. She is encouraged to followup with your nose and throat specialist  regarding her change in hearing. She is to continue on her supplemental calcium and vitamin D and to maintain good dental hygiene.   Laural Benes, Clarke Amburn E, PA-C   All questions were answered. The patient knows to call the clinic with any problems, questions or concerns. We  can certainly see the patient much sooner if necessary.  I spent 20 minutes counseling the patient face to face. The total time spent in the appointment was 30 minutes.

## 2012-08-26 ENCOUNTER — Telehealth: Payer: Self-pay

## 2012-08-26 DIAGNOSIS — H9190 Unspecified hearing loss, unspecified ear: Secondary | ICD-10-CM

## 2012-08-26 NOTE — Telephone Encounter (Signed)
Pt called requesting a referral for Audiology testing. Pt is also requesting a Pneumovax, please advise.

## 2012-08-26 NOTE — Telephone Encounter (Signed)
Called left message to call back 

## 2012-08-26 NOTE — Telephone Encounter (Signed)
Ok for nurse visit for the pneumovax - (and let pt know, none further in the future needed after this)  OK for audiology referral - will do

## 2012-08-26 NOTE — Telephone Encounter (Signed)
Please cancel audiology referral for this pt

## 2012-08-26 NOTE — Telephone Encounter (Signed)
Dr Haroldine Laws is with ENT  (not audiology)  I will  Cancel audiology referral  OK for ENT referral

## 2012-08-26 NOTE — Telephone Encounter (Signed)
Patient informed.  She would like her referral if possible to be with Dr. Haroldine Laws.

## 2012-08-28 ENCOUNTER — Ambulatory Visit (INDEPENDENT_AMBULATORY_CARE_PROVIDER_SITE_OTHER): Payer: Medicare Other | Admitting: *Deleted

## 2012-08-28 ENCOUNTER — Telehealth: Payer: Self-pay | Admitting: *Deleted

## 2012-08-28 DIAGNOSIS — Z23 Encounter for immunization: Secondary | ICD-10-CM

## 2012-08-28 NOTE — Telephone Encounter (Signed)
Pt wants referral to ENT for assessment of hearing loss in left ear-she states that she has already seen audiology and does not want to see them again.

## 2012-08-28 NOTE — Telephone Encounter (Signed)
Ok, last referral was to ENT

## 2012-09-02 ENCOUNTER — Encounter: Payer: Self-pay | Admitting: Physician Assistant

## 2012-09-09 ENCOUNTER — Telehealth: Payer: Self-pay | Admitting: Internal Medicine

## 2012-09-09 ENCOUNTER — Encounter: Payer: Self-pay | Admitting: *Deleted

## 2012-09-09 ENCOUNTER — Ambulatory Visit (HOSPITAL_BASED_OUTPATIENT_CLINIC_OR_DEPARTMENT_OTHER): Payer: Medicare Other

## 2012-09-09 ENCOUNTER — Ambulatory Visit: Payer: Medicare Other

## 2012-09-09 ENCOUNTER — Ambulatory Visit (HOSPITAL_BASED_OUTPATIENT_CLINIC_OR_DEPARTMENT_OTHER): Payer: Medicare Other | Admitting: Physician Assistant

## 2012-09-09 ENCOUNTER — Encounter: Payer: Self-pay | Admitting: Physician Assistant

## 2012-09-09 ENCOUNTER — Other Ambulatory Visit (HOSPITAL_BASED_OUTPATIENT_CLINIC_OR_DEPARTMENT_OTHER): Payer: Medicare Other | Admitting: Lab

## 2012-09-09 ENCOUNTER — Telehealth: Payer: Self-pay | Admitting: *Deleted

## 2012-09-09 VITALS — BP 123/75 | HR 63 | Temp 96.9°F | Resp 20 | Ht 65.0 in | Wt 141.8 lb

## 2012-09-09 DIAGNOSIS — C7951 Secondary malignant neoplasm of bone: Secondary | ICD-10-CM

## 2012-09-09 DIAGNOSIS — C3490 Malignant neoplasm of unspecified part of unspecified bronchus or lung: Secondary | ICD-10-CM

## 2012-09-09 DIAGNOSIS — C3410 Malignant neoplasm of upper lobe, unspecified bronchus or lung: Secondary | ICD-10-CM

## 2012-09-09 DIAGNOSIS — Z5111 Encounter for antineoplastic chemotherapy: Secondary | ICD-10-CM

## 2012-09-09 LAB — COMPREHENSIVE METABOLIC PANEL (CC13)
ALT: 24 U/L (ref 0–55)
AST: 25 U/L (ref 5–34)
Albumin: 3.8 g/dL (ref 3.5–5.0)
Calcium: 10.2 mg/dL (ref 8.4–10.4)
Chloride: 102 mEq/L (ref 98–107)
Potassium: 3.9 mEq/L (ref 3.5–5.1)
Sodium: 140 mEq/L (ref 136–145)

## 2012-09-09 LAB — CBC WITH DIFFERENTIAL/PLATELET
BASO%: 0.7 % (ref 0.0–2.0)
Basophils Absolute: 0 10*3/uL (ref 0.0–0.1)
EOS%: 0.4 % (ref 0.0–7.0)
HCT: 37.5 % (ref 34.8–46.6)
LYMPH%: 39.3 % (ref 14.0–49.7)
MCH: 32.9 pg (ref 25.1–34.0)
MCHC: 33.9 g/dL (ref 31.5–36.0)
MCV: 97.2 fL (ref 79.5–101.0)
MONO%: 10.8 % (ref 0.0–14.0)
NEUT%: 48.8 % (ref 38.4–76.8)
Platelets: 273 10*3/uL (ref 145–400)

## 2012-09-09 MED ORDER — DEXAMETHASONE SODIUM PHOSPHATE 10 MG/ML IJ SOLN
10.0000 mg | Freq: Once | INTRAMUSCULAR | Status: AC
  Administered 2012-09-09: 10 mg via INTRAVENOUS

## 2012-09-09 MED ORDER — SODIUM CHLORIDE 0.9 % IV SOLN
Freq: Once | INTRAVENOUS | Status: AC
  Administered 2012-09-09: 12:00:00 via INTRAVENOUS

## 2012-09-09 MED ORDER — ONDANSETRON 8 MG/50ML IVPB (CHCC)
8.0000 mg | Freq: Once | INTRAVENOUS | Status: AC
  Administered 2012-09-09: 8 mg via INTRAVENOUS

## 2012-09-09 MED ORDER — SODIUM CHLORIDE 0.9 % IV SOLN
500.0000 mg/m2 | Freq: Once | INTRAVENOUS | Status: AC
  Administered 2012-09-09: 875 mg via INTRAVENOUS
  Filled 2012-09-09: qty 35

## 2012-09-09 NOTE — Telephone Encounter (Signed)
gv and pritned pt appt schedule for Jan...gv pt barium..the patient aware that central scheduling will contact with d/t of ct.

## 2012-09-09 NOTE — Telephone Encounter (Signed)
Per staff phone call and POF I have scheduled appts. [name]  

## 2012-09-09 NOTE — Progress Notes (Signed)
Spoke with pt at [facility] today. No questions or concerns at this time.  

## 2012-09-09 NOTE — Patient Instructions (Signed)
[facility] Discharge Instructions for Patients Receiving Chemotherapy  Today you received the following chemotherapy agents : Alimta  To help prevent nausea and vomiting after your treatment, we encourage you to take your nausea medication as directed by your MD. If you develop nausea and vomiting that is not controlled by your nausea medication, call the clinic. If it is after clinic hours your family physician or the after hours number for the clinic or go to the Emergency Department.   BELOW ARE SYMPTOMS THAT SHOULD BE REPORTED IMMEDIATELY:  *FEVER GREATER THAN 100.5 F  *CHILLS WITH OR WITHOUT FEVER  NAUSEA AND VOMITING THAT IS NOT CONTROLLED WITH YOUR NAUSEA MEDICATION  *UNUSUAL SHORTNESS OF BREATH  *UNUSUAL BRUISING OR BLEEDING  TENDERNESS IN MOUTH AND THROAT WITH OR WITHOUT PRESENCE OF ULCERS  *URINARY PROBLEMS  *BOWEL PROBLEMS  UNUSUAL RASH Items with * indicate a potential emergency and should be followed up as soon as possible.   Feel free to call the clinic you have any questions or concerns. The clinic phone number is [phone].    

## 2012-09-09 NOTE — Patient Instructions (Addendum)
Follow up with Dr. Arbutus Ped in 1 month with restaging CT scans to re-evaluate your disease

## 2012-09-13 NOTE — Progress Notes (Signed)
[facility] [phone]   [phone]  OFFICE PROGRESS NOTE  [name], MD [address] 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   PRIOR THERAPY:  1. Status post left upper lobectomy with mediastinal lymph node dissection under the care of Dr. Dorris Fetch on December 10, 2007.  2. Status post 4 cycles of adjuvant chemotherapy with cisplatin and Taxotere. Last dose was given 03/22/2008.  3. Status post 6 cycles of systemic chemotherapy with carboplatin and Alimta for metastatic disease in the bone. The last dose was given July 02, 2010, with stable disease.  4. Status post 12 cycles of maintenance Alimta at 500 mg/sq m given every 3 weeks. The last dose was given 05/08/2011.  CURRENT THERAPY:  1. Maintenance Alimta at 500 mg/sq m given every 4 weeks. The patient is status post 17 cycles.  2. Zometa 4 mg IV given every 2 months for bone metastasis.   INTERVAL HISTORY: Tamara Ortiz 71 y.o. female returns to the clinic today for routine followup visit. She voiced no other specific complaints today.  She is tolerating her maintenance chemotherapy with single agent Alimta relatively well.  She denied having any significant chest pain, shortness breath, cough or hemoptysis. She denied having any significant weight loss or night sweats. She would like to reschedule her January treatment and followup visit from 10/07/2012 to 10/14/2012 due to a beach trip.  MEDICAL HISTORY: Past Medical History  Diagnosis Date  . Hypertension   . HYPERLIPIDEMIA 11/05/2007  . ANXIETY 05/13/2009  . DEPRESSIVE DISORDER 05/06/2008  . HYPERTENSION 11/05/2007  . ALLERGIC RHINITIS 11/05/2007  . CONSTIPATION, CHRONIC 05/13/2009  . UTI 05/06/2008  . BACK PAIN  05/11/2008  . OSTEOPOROSIS 11/05/2007  . INSOMNIA-SLEEP DISORDER-UNSPEC 05/13/2009  . FATIGUE 05/13/2009  . Swelling, mass, or lump in chest 11/05/2007  . LUNG CANCER, HX OF 05/06/2008  . Cancer     lung ca    ALLERGIES:  is allergic to amoxicillin.  MEDICATIONS:  Current Outpatient Prescriptions  Medication Sig Dispense Refill  . aspirin 81 MG EC tablet Take 81 mg by mouth daily.        . Calcium Carbonate-Vitamin D (CALCIUM-VITAMIN D) 500-200 MG-UNIT per tablet Take 1 tablet by mouth 2 (two) times daily.        . Flaxseed, Linseed, (FLAXSEED OIL) 1000 MG CAPS Take 1,000 mg by mouth daily.        . folic acid (FOLVITE) 1 MG tablet TAKE 1 TABLET BY MOUTH EVERY DAY  90 tablet  0  . HYDROcodone-acetaminophen (NORCO) 10-325 MG per tablet       . LORazepam (ATIVAN) 0.5 MG tablet TAKE 1 TABLET BY MOUTH EVERY 8 HOURS AS NEEDED FOR ANXIETY  40 tablet  1  . losartan-hydrochlorothiazide (HYZAAR) 50-12.5 MG per tablet Take 1 tablet by mouth daily.  90 tablet  3  . Multiple Vitamin (ANTIOXIDANT A/C/E PO) Take by mouth daily.        . Omega-3 350 MG CAPS Take by mouth daily.        . polyethylene glycol powder (MIRALAX) powder Take 17 g by mouth daily.        . Probiotic Product (PROBIOTIC FORMULA) CAPS Take 1 capsule by mouth daily.        [name]  simvastatin (ZOCOR) 40 MG tablet Take 1 tablet (40 mg total) by mouth every other day.  90 tablet  2  . vitamin B-12 (CYANOCOBALAMIN) 100 MCG tablet Take 50 mcg by mouth daily.        . vitamin E ([employer] VITAMIN E) 400 UNIT capsule Take 400 Units by mouth daily.        [name] zolendronic acid (ZOMETA) 4 MG/5ML injection Inject 4 mg into the vein once.        [name] zolpidem (AMBIEN CR) 12.5 MG CR tablet Take 1 tablet (12.5 mg total) by mouth at bedtime as needed for sleep.  30 tablet  5    SURGICAL HISTORY:  Past Surgical History  Procedure Date  . Tubal ligation   . S/p lul lung surgury 12/2007  . Lobectomy     REVIEW OF SYSTEMS:  A comprehensive review of systems was  negative.   PHYSICAL EXAMINATION: General appearance: alert, cooperative and no distress Head: Normocephalic, without obvious abnormality, atraumatic Neck: no adenopathy Lymph nodes: Cervical, supraclavicular, and axillary nodes normal. Resp: clear to auscultation bilaterally Cardio: regular rate and rhythm, S1, S2 normal, no murmur, click, rub or gallop GI: soft, non-tender; bowel sounds normal; no masses,  no organomegaly Extremities: extremities normal, atraumatic, no cyanosis or edema Neurologic: Alert and oriented X 3, normal strength and tone. Normal symmetric reflexes. Normal coordination and gait   ECOG PERFORMANCE STATUS: 1 - Symptomatic but completely ambulatory  Blood pressure 123/75, pulse 63, temperature 96.9 F (36.1 C), temperature source Oral, resp. rate 20, height 5\' 5"  (1.651 m), weight 141 lb 12.8 oz (64.32 kg).  LABORATORY DATA: Lab Results  Component Value Date   WBC 4.5 09/09/2012   HGB 12.7 09/09/2012   HCT 37.5 09/09/2012   MCV 97.2 09/09/2012   PLT 273 09/09/2012      Chemistry      Component Value Date/Time   NA 140 09/09/2012 1031   NA 137 05/21/2012 0910   NA 140 04/17/2012 0855   K 3.9 09/09/2012 1031   K 4.1 05/21/2012 0910   K 4.1 04/17/2012 0855   CL 102 09/09/2012 1031   CL 102 05/21/2012 0910   CL 99 04/17/2012 0855   CO2 28 09/09/2012 1031   CO2 27 05/21/2012 0910   CO2 29 04/17/2012 0855   BUN 14.0 09/09/2012 1031   BUN 20 05/21/2012 0910   BUN 15 04/17/2012 0855   CREATININE 1.0 09/09/2012 1031   CREATININE 1.16* 05/21/2012 0910   CREATININE 1.4* 04/17/2012 0855      Component Value Date/Time   CALCIUM 10.2 09/09/2012 1031   CALCIUM 9.9 05/21/2012 0910   CALCIUM 9.5 04/17/2012 0855   ALKPHOS 74 09/09/2012 1031   ALKPHOS 63 05/21/2012 0910   ALKPHOS 68 04/17/2012 0855   AST 25 09/09/2012 1031   AST 19 05/21/2012 0910   AST 29 04/17/2012 0855   ALT 24 09/09/2012 1031   ALT 16 05/21/2012 0910   BILITOT 0.51 09/09/2012 1031   BILITOT 0.4 05/21/2012 0910    BILITOT 0.60 04/17/2012 0855        RADIOGRAPHIC STUDIES:  No results found.       ASSESSMENT/PLAN: This is a very pleasant 71 years old white female with metastatic non-small cell lung cancer currently on maintenance and Alimta as well as Zometa on a monthly basis. The patient is doing fine and tolerating her treatment fairly well.  She had no evidence for disease progression on her most recent CT  scan dated 07/08/2012 The patient was discussed with Dr. Arbutus Ped.  She'll proceed with her scheduled maintenance chemotherapy with single agent Alimta today. She is to continue on her supplemental calcium and vitamin D and to maintain good dental hygiene. She'll followup in one month with a repeat CBC differential, C. met and CT of the chest, abdomen and pelvis with contrast to reevaluate her disease however this will be scheduled on 10/09/2012 instead of 10/07/2012 to accommodate her beach trip.  Laural Benes, Luba Matzen E, PA-C   All questions were answered. The patient knows to call the clinic with any problems, questions or concerns. We can certainly see the patient much sooner if necessary.  I spent 20 minutes counseling the patient face to face. The total time spent in the appointment was 30 minutes.

## 2012-09-27 ENCOUNTER — Telehealth: Payer: Self-pay | Admitting: *Deleted

## 2012-09-27 NOTE — Telephone Encounter (Signed)
Per staff message I have moved appt from 1/8 to 1/7. [name]  

## 2012-10-03 ENCOUNTER — Encounter (HOSPITAL_COMMUNITY): Payer: Self-pay

## 2012-10-03 ENCOUNTER — Ambulatory Visit (HOSPITAL_COMMUNITY)
Admission: RE | Admit: 2012-10-03 | Discharge: 2012-10-03 | Disposition: A | Discharge status: Home / Self Care | Payer: Medicare Other | Source: Ambulatory Visit | Attending: Physician Assistant | Admitting: Physician Assistant

## 2012-10-03 ENCOUNTER — Other Ambulatory Visit: Payer: Self-pay | Admitting: Physician Assistant

## 2012-10-03 DIAGNOSIS — C3490 Malignant neoplasm of unspecified part of unspecified bronchus or lung: Secondary | ICD-10-CM | POA: Insufficient documentation

## 2012-10-03 DIAGNOSIS — C7952 Secondary malignant neoplasm of bone marrow: Secondary | ICD-10-CM | POA: Insufficient documentation

## 2012-10-03 DIAGNOSIS — C7951 Secondary malignant neoplasm of bone: Secondary | ICD-10-CM | POA: Insufficient documentation

## 2012-10-03 MED ORDER — IOHEXOL 300 MG/ML  SOLN
100.0000 mL | Freq: Once | INTRAMUSCULAR | Status: AC | PRN
  Administered 2012-10-03: 100 mL via INTRAVENOUS

## 2012-10-04 ENCOUNTER — Telehealth: Payer: Self-pay | Admitting: Internal Medicine

## 2012-10-04 ENCOUNTER — Other Ambulatory Visit (HOSPITAL_COMMUNITY): Payer: Medicare Other

## 2012-10-04 NOTE — Telephone Encounter (Signed)
l/m with appt info for 10/08/12   Tamara Ortiz

## 2012-10-07 ENCOUNTER — Ambulatory Visit: Payer: Medicare Other

## 2012-10-07 ENCOUNTER — Other Ambulatory Visit: Payer: Medicare Other | Admitting: Lab

## 2012-10-08 ENCOUNTER — Ambulatory Visit (HOSPITAL_BASED_OUTPATIENT_CLINIC_OR_DEPARTMENT_OTHER): Payer: Medicare Other

## 2012-10-08 ENCOUNTER — Ambulatory Visit (HOSPITAL_BASED_OUTPATIENT_CLINIC_OR_DEPARTMENT_OTHER): Payer: Medicare Other | Admitting: Internal Medicine

## 2012-10-08 ENCOUNTER — Telehealth: Payer: Self-pay | Admitting: Internal Medicine

## 2012-10-08 ENCOUNTER — Ambulatory Visit: Payer: Medicare Other | Admitting: Internal Medicine

## 2012-10-08 ENCOUNTER — Encounter: Payer: Self-pay | Admitting: Internal Medicine

## 2012-10-08 ENCOUNTER — Other Ambulatory Visit (HOSPITAL_BASED_OUTPATIENT_CLINIC_OR_DEPARTMENT_OTHER): Payer: Medicare Other | Admitting: Lab

## 2012-10-08 VITALS — BP 105/63 | HR 59 | Temp 96.8°F | Resp 18 | Ht 65.0 in | Wt 144.6 lb

## 2012-10-08 DIAGNOSIS — Z5111 Encounter for antineoplastic chemotherapy: Secondary | ICD-10-CM

## 2012-10-08 DIAGNOSIS — C3490 Malignant neoplasm of unspecified part of unspecified bronchus or lung: Secondary | ICD-10-CM

## 2012-10-08 DIAGNOSIS — C7951 Secondary malignant neoplasm of bone: Secondary | ICD-10-CM

## 2012-10-08 DIAGNOSIS — C3410 Malignant neoplasm of upper lobe, unspecified bronchus or lung: Secondary | ICD-10-CM

## 2012-10-08 DIAGNOSIS — C7952 Secondary malignant neoplasm of bone marrow: Secondary | ICD-10-CM

## 2012-10-08 LAB — CBC WITH DIFFERENTIAL/PLATELET
BASO%: 0.9 % (ref 0.0–2.0)
EOS%: 1.1 % (ref 0.0–7.0)
LYMPH%: 37.1 % (ref 14.0–49.7)
MCH: 32.8 pg (ref 25.1–34.0)
MCHC: 34 g/dL (ref 31.5–36.0)
MONO#: 0.4 10*3/uL (ref 0.1–0.9)
RBC: 3.69 10*6/uL — ABNORMAL LOW (ref 3.70–5.45)
WBC: 5.5 10*3/uL (ref 3.9–10.3)
lymph#: 2.1 10*3/uL (ref 0.9–3.3)

## 2012-10-08 LAB — COMPREHENSIVE METABOLIC PANEL
ALT: 17 U/L (ref 0–35)
AST: 22 U/L (ref 0–37)
CO2: 29 mEq/L (ref 19–32)
Creatinine, Ser: 0.96 mg/dL (ref 0.50–1.10)
Sodium: 135 mEq/L (ref 135–145)
Total Bilirubin: 0.2 mg/dL — ABNORMAL LOW (ref 0.3–1.2)
Total Protein: 7 g/dL (ref 6.0–8.3)

## 2012-10-08 MED ORDER — ZOLEDRONIC ACID 4 MG/100ML IV SOLN
4.0000 mg | Freq: Once | INTRAVENOUS | Status: AC
  Administered 2012-10-08: 4 mg via INTRAVENOUS
  Filled 2012-10-08: qty 100

## 2012-10-08 MED ORDER — ONDANSETRON 8 MG/50ML IVPB (CHCC)
8.0000 mg | Freq: Once | INTRAVENOUS | Status: AC
  Administered 2012-10-08: 8 mg via INTRAVENOUS

## 2012-10-08 MED ORDER — SODIUM CHLORIDE 0.9 % IV SOLN: Freq: Once | INTRAVENOUS | Status: DC

## 2012-10-08 MED ORDER — SODIUM CHLORIDE 0.9 % IV SOLN
Freq: Once | INTRAVENOUS | Status: AC
  Administered 2012-10-08: 17:00:00 via INTRAVENOUS

## 2012-10-08 MED ORDER — DEXAMETHASONE SODIUM PHOSPHATE 10 MG/ML IJ SOLN
10.0000 mg | Freq: Once | INTRAMUSCULAR | Status: AC
  Administered 2012-10-08: 10 mg via INTRAVENOUS

## 2012-10-08 MED ORDER — SODIUM CHLORIDE 0.9 % IV SOLN
500.0000 mg/m2 | Freq: Once | INTRAVENOUS | Status: AC
  Administered 2012-10-08: 875 mg via INTRAVENOUS
  Filled 2012-10-08: qty 35

## 2012-10-08 NOTE — Patient Instructions (Addendum)
[facility] Discharge Instructions for Patients Receiving Chemotherapy  Today you received the following chemotherapy agents *alimta/zometa To help prevent nausea and vomiting after your treatment, we encourage you to take your nausea medicatioand take it as often as prescribed   If you develop nausea and vomiting that is not controlled by your nausea medication, call the clinic. If it is after clinic hours your family physician or the after hours number for the clinic or go to the Emergency Department.   BELOW ARE SYMPTOMS THAT SHOULD BE REPORTED IMMEDIATELY:  *FEVER GREATER THAN 100.5 F  *CHILLS WITH OR WITHOUT FEVER  NAUSEA AND VOMITING THAT IS NOT CONTROLLED WITH YOUR NAUSEA MEDICATION  *UNUSUAL SHORTNESS OF BREATH  *UNUSUAL BRUISING OR BLEEDING  TENDERNESS IN MOUTH AND THROAT WITH OR WITHOUT PRESENCE OF ULCERS  *URINARY PROBLEMS  *BOWEL PROBLEMS  UNUSUAL RASH Items with * indicate a potential emergency and should be followed up as soon as possible.  One of the nurses will contact you 24 hours after your treatment. Please let the nurse know about any problems that you may have experienced. Feel free to call the clinic you have any questions or concerns. The clinic phone number is [phone].   I have been informed and understand all the instructions given to me. I know to contact the clinic, my physician, or go to the Emergency Department if any problems should occur. I do not have any questions at this time, but understand that I may call the clinic during office hours   should I have any questions or need assistance in obtaining follow up care.    __________________________________________  _____________  __________ Signature of Patient or Authorized Representative            Date                   Time    __________________________________________ Nurse's Signature

## 2012-10-08 NOTE — Patient Instructions (Signed)
No evidence for disease progression on the recent scan. Followup visit in 4 weeks with the next cycle of chemotherapy. Continue treatment with Zometa as scheduled.

## 2012-10-08 NOTE — Progress Notes (Signed)
[facility] Telephone:([phone]   Fax:([phone]  OFFICE PROGRESS NOTE  [name], MD [address] [facility] [address] [place] [address] [place] [address]  DIAGNOSIS: Metastatic non-small cell lung cancer initially diagnosed as stage IIB (T2 N1 M0) adenocarcinoma with bronchoalveolar features in June 2009 and the patient has EGFR mutation exon 21 at the surgical specimen.   PRIOR THERAPY:  1. Status post left upper lobectomy with mediastinal lymph node dissection under the care of Dr. Dorris Fetch on December 10, 2007.  2. Status post 4 cycles of adjuvant chemotherapy with cisplatin and Taxotere. Last dose was given 03/22/2008.  3. Status post 6 cycles of systemic chemotherapy with carboplatin and Alimta for metastatic disease in the bone. The last dose was given July 02, 2010, with stable disease.  4. Status post 12 cycles of maintenance Alimta at 500 mg/sq m given every 3 weeks. The last dose was given 05/08/2011.  CURRENT THERAPY:  1. Maintenance Alimta at 500 mg/sq m given every 4 weeks. The patient is status post 18 cycles.  2. Zometa 4 mg IV given every 2 months for bone metastasis.   INTERVAL HISTORY: Tamara Ortiz 72 y.o. female returns to the clinic today for followup visit. The patient is feeling fine with no specific complaints. She had some hearing problem especially on the left ear. She was seen recently by Dr. Brion Aliment for evaluation of this problem. She denied having any significant chest pain, shortness breath, cough or hemoptysis. She denied having any significant weight loss or night sweats. She is tolerating her treatment with maintenance Alimta fairly well with no significant adverse effects. The patient had repeat CT scan of the chest, abdomen and pelvis performed recently and she is here for evaluation and discussion of her scan results.  MEDICAL HISTORY: Past Medical History  Diagnosis Date  . Hypertension   . HYPERLIPIDEMIA 11/05/2007  . ANXIETY  05/13/2009  . DEPRESSIVE DISORDER 05/06/2008  . HYPERTENSION 11/05/2007  . ALLERGIC RHINITIS 11/05/2007  . CONSTIPATION, CHRONIC 05/13/2009  . UTI 05/06/2008  . BACK PAIN 05/11/2008  . OSTEOPOROSIS 11/05/2007  . INSOMNIA-SLEEP DISORDER-UNSPEC 05/13/2009  . FATIGUE 05/13/2009  . Swelling, mass, or lump in chest 11/05/2007  . LUNG CANCER, HX OF 05/06/2008  . lung ca w/ bone mets dx'd 11/2007    lt hip and spine    ALLERGIES:  is allergic to amoxicillin.  MEDICATIONS:  Current Outpatient Prescriptions  Medication Sig Dispense Refill  . aspirin 81 MG EC tablet Take 81 mg by mouth daily.        . Calcium Carbonate-Vitamin D (CALCIUM-VITAMIN D) 500-200 MG-UNIT per tablet Take 1 tablet by mouth 2 (two) times daily.        . Flaxseed, Linseed, (FLAXSEED OIL) 1000 MG CAPS Take 1,000 mg by mouth daily.        . folic acid (FOLVITE) 1 MG tablet TAKE 1 TABLET BY MOUTH EVERY DAY  90 tablet  0  . HYDROcodone-acetaminophen (NORCO) 10-325 MG per tablet       . LORazepam (ATIVAN) 0.5 MG tablet TAKE 1 TABLET BY MOUTH EVERY 8 HOURS AS NEEDED FOR ANXIETY  40 tablet  1  . losartan-hydrochlorothiazide (HYZAAR) 50-12.5 MG per tablet Take 1 tablet by mouth daily.  90 tablet  3  . Multiple Vitamin (ANTIOXIDANT A/C/E PO) Take by mouth daily.        . Omega-3 350 MG CAPS Take by mouth daily.        [name]  polyethylene glycol powder (MIRALAX) powder Take 17 g by mouth daily.        . Probiotic Product (PROBIOTIC FORMULA) CAPS Take 1 capsule by mouth daily.        . simvastatin (ZOCOR) 40 MG tablet Take 1 tablet (40 mg total) by mouth every other day.  90 tablet  2  . vitamin B-12 (CYANOCOBALAMIN) 100 MCG tablet Take 50 mcg by mouth daily.        . vitamin E ([employer]S VITAMIN E) 400 UNIT capsule Take 400 Units by mouth daily.        [name] zolendronic acid (ZOMETA) 4 MG/5ML injection Inject 4 mg into the vein once.        [name] zolpidem (AMBIEN CR) 12.5 MG CR tablet Take 1 tablet (12.5 mg total) by mouth at bedtime as needed for sleep.  30 tablet   5   No current facility-administered medications for this visit.   Facility-Administered Medications Ordered in Other Visits  Medication Dose Route Frequency Provider Last Rate Last Dose  . 0.9 %  sodium chloride infusion   Intravenous Once Si Gaul, MD      . dexamethasone (DECADRON) injection 10 mg  10 mg Intravenous Once Si Gaul, MD      . ondansetron (ZOFRAN) IVPB 8 mg  8 mg Intravenous Once Si Gaul, MD      . PEMEtrexed (ALIMTA) 875 mg in sodium chloride 0.9 % 100 mL chemo infusion  500 mg/m2 (Treatment Plan Actual) Intravenous Once Si Gaul, MD        SURGICAL HISTORY:  Past Surgical History  Procedure Date  . Tubal ligation   . S/p lul lung surgury 12/2007  . Lobectomy     REVIEW OF SYSTEMS:  A comprehensive review of systems was negative.   PHYSICAL EXAMINATION: General appearance: alert, cooperative and no distress Head: Normocephalic, without obvious abnormality, atraumatic Neck: no adenopathy Lymph nodes: Cervical, supraclavicular, and axillary nodes normal. Resp: clear to auscultation bilaterally Cardio: regular rate and rhythm, S1, S2 normal, no murmur, click, rub or gallop GI: soft, non-tender; bowel sounds normal; no masses,  no organomegaly Extremities: extremities normal, atraumatic, no cyanosis or edema Neurologic: Alert and oriented X 3, normal strength and tone. Normal symmetric reflexes. Normal coordination and gait  ECOG PERFORMANCE STATUS: 0 - Asymptomatic  Blood pressure 105/63, pulse 59, temperature 96.8 F (36 C), temperature source Oral, resp. rate 18, height 5\' 5"  (1.651 m), weight 144 lb 9.6 oz (65.59 kg).  LABORATORY DATA: Lab Results  Component Value Date   WBC 5.5 10/08/2012   HGB 12.1 10/08/2012   HCT 35.6 10/08/2012   MCV 96.5 10/08/2012   PLT 238 10/08/2012      Chemistry      Component Value Date/Time   NA 140 09/09/2012 1031   NA 137 05/21/2012 0910   NA 140 04/17/2012 0855   K 3.9 09/09/2012 1031   K 4.1  05/21/2012 0910   K 4.1 04/17/2012 0855   CL 102 09/09/2012 1031   CL 102 05/21/2012 0910   CL 99 04/17/2012 0855   CO2 28 09/09/2012 1031   CO2 27 05/21/2012 0910   CO2 29 04/17/2012 0855   BUN 14.0 09/09/2012 1031   BUN 20 05/21/2012 0910   BUN 15 04/17/2012 0855   CREATININE 1.0 09/09/2012 1031   CREATININE 1.16* 05/21/2012 0910   CREATININE 1.4* 04/17/2012 0855      Component Value Date/Time   CALCIUM 10.2 09/09/2012 1031   CALCIUM  9.9 05/21/2012 0910   CALCIUM 9.5 04/17/2012 0855   ALKPHOS 74 09/09/2012 1031   ALKPHOS 63 05/21/2012 0910   ALKPHOS 68 04/17/2012 0855   AST 25 09/09/2012 1031   AST 19 05/21/2012 0910   AST 29 04/17/2012 0855   ALT 24 09/09/2012 1031   ALT 16 05/21/2012 0910   BILITOT 0.51 09/09/2012 1031   BILITOT 0.4 05/21/2012 0910   BILITOT 0.60 04/17/2012 0855       RADIOGRAPHIC STUDIES: Ct Chest W Contrast  10/03/2012  *RADIOLOGY REPORT*  Clinical Data:  Metastatic lung cancer  CT CHEST, ABDOMEN AND PELVIS WITH CONTRAST  Technique:  Multidetector CT imaging of the chest, abdomen and pelvis was performed following the standard protocol during bolus administration of intravenous contrast.  Contrast: OMNIPAQUE IOHEXOL 300 MG/ML  SOLN  Comparison:  07/08/2012 the  CT CHEST  Findings:  No axillary lymphadenopathy.  No mediastinal or hilar lymphadenopathy.  The heart is upper normal for size.  Surgical clips are seen in the left hilum.  No pericardial or pleural effusion and no  Lung windows show no parenchymal nodule or mass.  There is some minimal subsegmental atelectasis in the left lung base.  Vascular anatomy is compatible with prior left upper lobe resection.  Sclerotic lesion in the T7 vertebral body is stable.  A tiny sclerotic lesion in the T11 vertebral body is stable.  IMPRESSION: Stable exam.  No evidence for recurrent disease in this patient status post left upper lobectomy.  Stable appearance of sclerotic bone lesions, likely reflecting treated metastases.   CT ABDOMEN  AND PELVIS  Findings:  Small hiatal hernia again noted.  No focal abnormalities seen in the liver or spleen.  Stomach is otherwise unremarkable. The duodenum, pancreas, gallbladder, and adrenal glands have normal imaging features.  Kidneys are unremarkable.  No abdominal aortic aneurysm.  No free fluid or lymphadenopathy in the abdomen.  The abdominal bowel loops are unremarkable aside from slightly prominent stool volume in the colon.  Imaging through the pelvis shows no free intraperitoneal fluid.  No pelvic sidewall lymphadenopathy.  Bladder is unremarkable.  Uterus is normal.  No adnexal mass.  No substantial diverticular change in the left colon.  No colonic diverticulitis.  The terminal ileum is normal.  The appendix is normal.  Stable appearance of sclerotic osseous lesions in the pelvis.  IMPRESSION:  Stable exam.  No new or progressive findings to suggest new or progressive metastatic involvement.   Original Report Authenticated By: Kennith Center, M.D.    ASSESSMENT: This is a very pleasant 72 years old white female with metastatic non-small cell lung cancer, adenocarcinoma currently on maintenance chemotherapy with single agent total of 30 cycles. The patient is tolerating her treatment fairly well with no significant adverse effect and no evidence for disease progression.  PLAN: I discussed the scan results with the patient today. I recommended for her to continue on maintenance and Alimta with the current dose 500 mg/M2 every 4 weeks. She would come back for followup visit in 4 weeks with the start of cycle 32. She was advised to call me immediately if she has any concerning symptoms in the interval. For the bone disease, she will continue treatment with Zometa as scheduled.  All questions were answered. The patient knows to call the clinic with any problems, questions or concerns. We can certainly see the patient much sooner if necessary.  I spent 15 minutes counseling the patient face to  face. The total time spent  in the appointment was 25 minutes.

## 2012-10-08 NOTE — Telephone Encounter (Signed)
gv pt appt schedule for February and March. Per 1/7 pof lb/chemo q4w as ordered. Orders are for q3w. Confirmed w/MM appts should be q4w.

## 2012-10-09 ENCOUNTER — Other Ambulatory Visit: Payer: Medicare Other | Admitting: Lab

## 2012-10-09 ENCOUNTER — Ambulatory Visit: Payer: Medicare Other

## 2012-10-09 ENCOUNTER — Ambulatory Visit: Payer: Medicare Other | Admitting: Internal Medicine

## 2012-10-14 ENCOUNTER — Ambulatory Visit (INDEPENDENT_AMBULATORY_CARE_PROVIDER_SITE_OTHER)
Admission: RE | Admit: 2012-10-14 | Discharge: 2012-10-14 | Disposition: A | Discharge status: Home / Self Care | Payer: Medicare Other | Source: Ambulatory Visit

## 2012-10-14 DIAGNOSIS — M810 Age-related osteoporosis without current pathological fracture: Secondary | ICD-10-CM

## 2012-10-15 ENCOUNTER — Encounter: Payer: Self-pay | Admitting: Internal Medicine

## 2012-10-15 ENCOUNTER — Ambulatory Visit (INDEPENDENT_AMBULATORY_CARE_PROVIDER_SITE_OTHER): Payer: Medicare Other | Admitting: Internal Medicine

## 2012-10-15 VITALS — BP 112/82 | HR 63 | Temp 98.3°F | Ht 65.0 in | Wt 142.4 lb

## 2012-10-15 DIAGNOSIS — R350 Frequency of micturition: Secondary | ICD-10-CM

## 2012-10-15 DIAGNOSIS — N390 Urinary tract infection, site not specified: Secondary | ICD-10-CM

## 2012-10-15 LAB — POCT URINALYSIS DIPSTICK
Bilirubin, UA: NEGATIVE
Ketones, UA: NEGATIVE
Spec Grav, UA: 1.01
pH, UA: 7

## 2012-10-15 MED ORDER — CIPROFLOXACIN HCL 500 MG PO TABS: 500.0000 mg | ORAL_TABLET | Freq: Two times a day (BID) | ORAL | Status: DC

## 2012-10-15 NOTE — Progress Notes (Signed)
HPI  Pt presents to the clinic today with c/o urinary frequency, urgency and back pain. This started 4 days ago. She denies nausea, vomiting, fever of chills. She has a history of UTI's and states this feels the same. She has not taken anything OTC to help relieve this.   Review of Systems  Past Medical History  Diagnosis Date  . Hypertension   . HYPERLIPIDEMIA 11/05/2007  . ANXIETY 05/13/2009  . DEPRESSIVE DISORDER 05/06/2008  . HYPERTENSION 11/05/2007  . ALLERGIC RHINITIS 11/05/2007  . CONSTIPATION, CHRONIC 05/13/2009  . UTI 05/06/2008  . BACK PAIN 05/11/2008  . OSTEOPOROSIS 11/05/2007  . INSOMNIA-SLEEP DISORDER-UNSPEC 05/13/2009  . FATIGUE 05/13/2009  . Swelling, mass, or lump in chest 11/05/2007  . LUNG CANCER, HX OF 05/06/2008  . lung ca w/ bone mets dx'd 11/2007    lt hip and spine    Family History  Problem Relation Age of Onset  . Heart disease Mother   . ALS Father   . Alcohol abuse Son     ETOH    History   Social History  . Marital Status: Divorced    Spouse Name: N/A    Number of Children: N/A  . Years of Education: N/A   Occupational History  . 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 Smoking status: Never Smoker   . Smokeless tobacco: Never Used  . Alcohol Use: Yes  . Drug Use: No  . Sexually Active: No   Other Topics Concern  . Not on file   Social History Narrative  . No narrative on file    Allergies  Allergen Reactions  . Amoxicillin     REACTION: hives/itch    Constitutional: Denies fever, malaise, fatigue, headache or abrupt weight changes.   GU: Pt reports urgency, frequency and pain with urination. Denies burning sensation, blood in urine, odor or discharge. Skin: Denies redness, rashes, lesions or ulcercations.   No other specific complaints in a complete review of systems (except as listed in HPI above).    Objective:   Physical Exam  BP 112/82  Pulse 63   Temp 98.3 F (36.8 C) (Oral)  Ht 5\' 5"  (1.651 m)  Wt 142 lb 6.4 oz (64.592 kg)  BMI 23.70 kg/m2  SpO2 96% Wt Readings from Last 3 Encounters:  10/15/12 142 lb 6.4 oz (64.592 kg)  10/08/12 144 lb 9.6 oz (65.59 kg)  09/09/12 141 lb 12.8 oz (64.32 kg)    General: Appears her stated age, well developed, well nourished in NAD. Cardiovascular: Normal rate and rhythm. S1,S2 noted.  No murmur, rubs or gallops noted. No JVD or BLE edema. No carotid bruits noted. Pulmonary/Chest: Normal effort and positive vesicular breath sounds. No respiratory distress. No wheezes, rales or ronchi noted.  Abdomen: Soft and nontender. Normal bowel sounds, no bruits noted. No distention or masses noted. Liver, spleen and kidneys non palpable. Tender to palpation over the bladder area. No CVA tenderness.      Assessment & Plan:   Urgency Frequency   eRx sent if for Cipro 500 mg BID x 3 days eRx sent in for Pyridium 200 mg TID prn Drink plenty of fluids  RTC as needed or if symptoms persist.

## 2012-10-15 NOTE — Patient Instructions (Signed)
Urinary Tract Infection Urinary tract infections (UTIs) can develop anywhere along your urinary tract. Your urinary tract is your body's drainage system for removing wastes and extra water. Your urinary tract includes two kidneys, two ureters, a bladder, and a urethra. Your kidneys are a pair of bean-shaped organs. Each kidney is about the size of your fist. They are located below your ribs, one on each side of your spine. CAUSES Infections are caused by microbes, which are microscopic organisms, including fungi, viruses, and bacteria. These organisms are so small that they can only be seen through a microscope. Bacteria are the microbes that most commonly cause UTIs. SYMPTOMS  Symptoms of UTIs may vary by age and gender of the patient and by the location of the infection. Symptoms in young women typically include a frequent and intense urge to urinate and a painful, burning feeling in the bladder or urethra during urination. Older women and men are more likely to be tired, shaky, and weak and have muscle aches and abdominal pain. A fever may mean the infection is in your kidneys. Other symptoms of a kidney infection include pain in your back or sides below the ribs, nausea, and vomiting. DIAGNOSIS To diagnose a UTI, your caregiver will ask you about your symptoms. Your caregiver also will ask to provide a urine sample. The urine sample will be tested for bacteria and white blood cells. White blood cells are made by your body to help fight infection. TREATMENT  Typically, UTIs can be treated with medication. Because most UTIs are caused by a bacterial infection, they usually can be treated with the use of antibiotics. The choice of antibiotic and length of treatment depend on your symptoms and the type of bacteria causing your infection. HOME CARE INSTRUCTIONS  If you were prescribed antibiotics, take them exactly as your caregiver instructs you. Finish the medication even if you feel better after you  have only taken some of the medication.  Drink enough water and fluids to keep your urine clear or pale yellow.  Avoid caffeine, tea, and carbonated beverages. They tend to irritate your bladder.  Empty your bladder often. Avoid holding urine for long periods of time.  Empty your bladder before and after sexual intercourse.  After a bowel movement, women should cleanse from front to back. Use each tissue only once. SEEK MEDICAL CARE IF:   You have back pain.  You develop a fever.  Your symptoms do not begin to resolve within 3 days. SEEK IMMEDIATE MEDICAL CARE IF:   You have severe back pain or lower abdominal pain.  You develop chills.  You have nausea or vomiting.  You have continued burning or discomfort with urination. MAKE SURE YOU:   Understand these instructions.  Will watch your condition.  Will get help right away if you are not doing well or get worse. Document Released: 06/28/2005 Document Revised: 03/19/2012 Document Reviewed: 10/27/2011 Exit[employer] Patient Information 2013 [employer].  

## 2012-10-16 ENCOUNTER — Telehealth: Payer: Self-pay | Admitting: *Deleted

## 2012-10-16 NOTE — Telephone Encounter (Signed)
Bone density report given to Dr Donnald Garre to review.  SLJ

## 2012-10-24 ENCOUNTER — Other Ambulatory Visit: Payer: Self-pay | Admitting: Internal Medicine

## 2012-10-24 DIAGNOSIS — Z1231 Encounter for screening mammogram for malignant neoplasm of breast: Secondary | ICD-10-CM

## 2012-10-27 ENCOUNTER — Encounter: Payer: Self-pay | Admitting: Internal Medicine

## 2012-10-28 ENCOUNTER — Other Ambulatory Visit: Payer: Self-pay

## 2012-10-28 MED ORDER — LOSARTAN POTASSIUM-HCTZ 50-12.5 MG PO TABS: 1.0000 | ORAL_TABLET | Freq: Every day | ORAL | Status: DC

## 2012-10-28 MED ORDER — SIMVASTATIN 40 MG PO TABS: 40.0000 mg | ORAL_TABLET | ORAL | Status: DC

## 2012-10-28 MED ORDER — ZOLPIDEM TARTRATE ER 12.5 MG PO TBCR: 12.5000 mg | EXTENDED_RELEASE_TABLET | Freq: Every evening | ORAL | Status: DC | PRN

## 2012-10-28 NOTE — Telephone Encounter (Signed)
Faxed hardcopy to pharmacy. 

## 2012-10-28 NOTE — Telephone Encounter (Signed)
Done hardcopy to robin  

## 2012-10-31 ENCOUNTER — Ambulatory Visit (HOSPITAL_COMMUNITY)
Admission: RE | Admit: 2012-10-31 | Discharge: 2012-10-31 | Disposition: A | Discharge status: Home / Self Care | Payer: Medicare Other | Source: Ambulatory Visit | Attending: Internal Medicine | Admitting: Internal Medicine

## 2012-10-31 DIAGNOSIS — Z1231 Encounter for screening mammogram for malignant neoplasm of breast: Secondary | ICD-10-CM | POA: Insufficient documentation

## 2012-11-04 ENCOUNTER — Encounter: Payer: Self-pay | Admitting: Pharmacist

## 2012-11-05 ENCOUNTER — Ambulatory Visit (HOSPITAL_BASED_OUTPATIENT_CLINIC_OR_DEPARTMENT_OTHER): Payer: Medicare Other | Admitting: Physician Assistant

## 2012-11-05 ENCOUNTER — Other Ambulatory Visit (HOSPITAL_BASED_OUTPATIENT_CLINIC_OR_DEPARTMENT_OTHER): Payer: Medicare Other | Admitting: Lab

## 2012-11-05 ENCOUNTER — Telehealth: Payer: Self-pay | Admitting: Internal Medicine

## 2012-11-05 ENCOUNTER — Ambulatory Visit (HOSPITAL_BASED_OUTPATIENT_CLINIC_OR_DEPARTMENT_OTHER): Payer: Medicare Other

## 2012-11-05 VITALS — BP 111/65 | HR 59 | Temp 98.3°F | Resp 20 | Ht 65.0 in | Wt 140.3 lb

## 2012-11-05 DIAGNOSIS — C7952 Secondary malignant neoplasm of bone marrow: Secondary | ICD-10-CM

## 2012-11-05 DIAGNOSIS — C3410 Malignant neoplasm of upper lobe, unspecified bronchus or lung: Secondary | ICD-10-CM

## 2012-11-05 DIAGNOSIS — C7951 Secondary malignant neoplasm of bone: Secondary | ICD-10-CM

## 2012-11-05 DIAGNOSIS — Z5111 Encounter for antineoplastic chemotherapy: Secondary | ICD-10-CM

## 2012-11-05 DIAGNOSIS — C3490 Malignant neoplasm of unspecified part of unspecified bronchus or lung: Secondary | ICD-10-CM

## 2012-11-05 DIAGNOSIS — M549 Dorsalgia, unspecified: Secondary | ICD-10-CM

## 2012-11-05 LAB — CBC WITH DIFFERENTIAL/PLATELET
BASO%: 0.7 % (ref 0.0–2.0)
Basophils Absolute: 0 10*3/uL (ref 0.0–0.1)
EOS%: 0.7 % (ref 0.0–7.0)
HCT: 38.4 % (ref 34.8–46.6)
HGB: 13 g/dL (ref 11.6–15.9)
LYMPH%: 33 % (ref 14.0–49.7)
MCH: 32.3 pg (ref 25.1–34.0)
MCHC: 33.9 g/dL (ref 31.5–36.0)
MCV: 95.5 fL (ref 79.5–101.0)
MONO%: 6.7 % (ref 0.0–14.0)
NEUT%: 58.9 % (ref 38.4–76.8)
Platelets: 271 10*3/uL (ref 145–400)
lymph#: 1.8 10*3/uL (ref 0.9–3.3)

## 2012-11-05 LAB — COMPREHENSIVE METABOLIC PANEL (CC13)
AST: 24 U/L (ref 5–34)
Albumin: 3.6 g/dL (ref 3.5–5.0)
Alkaline Phosphatase: 85 U/L (ref 40–150)
BUN: 16.5 mg/dL (ref 7.0–26.0)
Creatinine: 1.2 mg/dL — ABNORMAL HIGH (ref 0.6–1.1)
Glucose: 98 mg/dl (ref 70–99)
Potassium: 3.6 mEq/L (ref 3.5–5.1)

## 2012-11-05 MED ORDER — HYDROCODONE-ACETAMINOPHEN 5-325 MG PO TABS: 1.0000 | ORAL_TABLET | Freq: Four times a day (QID) | ORAL | Status: DC | PRN

## 2012-11-05 MED ORDER — ONDANSETRON 8 MG/50ML IVPB (CHCC)
8.0000 mg | Freq: Once | INTRAVENOUS | Status: AC
  Administered 2012-11-05: 8 mg via INTRAVENOUS

## 2012-11-05 MED ORDER — CYANOCOBALAMIN 1000 MCG/ML IJ SOLN
1000.0000 ug | Freq: Once | INTRAMUSCULAR | Status: AC
  Administered 2012-11-05: 1000 ug via INTRAMUSCULAR

## 2012-11-05 MED ORDER — SODIUM CHLORIDE 0.9 % IV SOLN
Freq: Once | INTRAVENOUS | Status: AC
  Administered 2012-11-05: 15:00:00 via INTRAVENOUS

## 2012-11-05 MED ORDER — SODIUM CHLORIDE 0.9 % IV SOLN
500.0000 mg/m2 | Freq: Once | INTRAVENOUS | Status: AC
  Administered 2012-11-05: 875 mg via INTRAVENOUS
  Filled 2012-11-05: qty 35

## 2012-11-05 MED ORDER — DEXAMETHASONE SODIUM PHOSPHATE 10 MG/ML IJ SOLN
10.0000 mg | Freq: Once | INTRAMUSCULAR | Status: AC
  Administered 2012-11-05: 10 mg via INTRAVENOUS

## 2012-11-05 NOTE — Telephone Encounter (Signed)
printed appt schedule for pt for Feb....emaield michelle to adjust chemo....pt aware

## 2012-11-05 NOTE — Patient Instructions (Addendum)
[facility] Discharge Instructions for Patients Receiving Chemotherapy  Today you received the following chemotherapy agents Alimta.  To help prevent nausea and vomiting after your treatment, we encourage you to take your nausea medication as prescribed.   If you develop nausea and vomiting that is not controlled by your nausea medication, call the clinic. If it is after clinic hours your family physician or the after hours number for the clinic or go to the Emergency Department.   BELOW ARE SYMPTOMS THAT SHOULD BE REPORTED IMMEDIATELY:  *FEVER GREATER THAN 100.5 F  *CHILLS WITH OR WITHOUT FEVER  NAUSEA AND VOMITING THAT IS NOT CONTROLLED WITH YOUR NAUSEA MEDICATION  *UNUSUAL SHORTNESS OF BREATH  *UNUSUAL BRUISING OR BLEEDING  TENDERNESS IN MOUTH AND THROAT WITH OR WITHOUT PRESENCE OF ULCERS  *URINARY PROBLEMS  *BOWEL PROBLEMS  UNUSUAL RASH Items with * indicate a potential emergency and should be followed up as soon as possible.  Feel free to call the clinic you have any questions or concerns. The clinic phone number is [phone].   I have been informed and understand all the instructions given to me. I know to contact the clinic, my physician, or go to the Emergency Department if any problems should occur. I do not have any questions at this time, but understand that I may call the clinic during office hours   should I have any questions or need assistance in obtaining follow up care.    __________________________________________  _____________  __________ Signature of Patient or Authorized Representative            Date                   Time    __________________________________________ Nurse's Signature    

## 2012-11-05 NOTE — Patient Instructions (Addendum)
Follow up in 1 month prior to your next cycle of chemotherapy and next Zometa infusion

## 2012-11-06 ENCOUNTER — Telehealth: Payer: Self-pay | Admitting: *Deleted

## 2012-11-06 NOTE — Telephone Encounter (Signed)
Per staff message and POF I have scheduled appts.  [name]  

## 2012-11-08 NOTE — Progress Notes (Signed)
[facility] [phone]   [phone]  OFFICE PROGRESS NOTE  [name], MD [address] [facility] [address] 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   PRIOR THERAPY:  1. Status post left upper lobectomy with mediastinal lymph node dissection under the care of Dr. Dorris Fetch on December 10, 2007.  2. Status post 4 cycles of adjuvant chemotherapy with cisplatin and Taxotere. Last dose was given 03/22/2008.  3. Status post 6 cycles of systemic chemotherapy with carboplatin and Alimta for metastatic disease in the bone. The last dose was given July 02, 2010, with stable disease.  4. Status post 12 cycles of maintenance Alimta at 500 mg/sq m given every 3 weeks. The last dose was given 05/08/2011.  CURRENT THERAPY:  1. Maintenance Alimta at 500 mg/sq m given every 4 weeks. The patient is status post 19 cycles.  2. Zometa 4 mg IV given every 2 months for bone metastasis.   INTERVAL HISTORY: Tamara Ortiz 72 y.o. female returns to the clinic today for followup visit. Overall she has been doing well except for some recent increased lower back pain. She notes some "tingling in the toes" but denied any numbness in her legs or problems with bowel or bladder incontinence. The pain was relieved with 1/2 tablet of Norco 10/325 mg. She requests a refill for the pain medication. She denied having any significant chest pain, shortness breath, cough or hemoptysis. She denied having any significant weight loss or night sweats. She is tolerating her treatment with maintenance Alimta fairly well with no significant adverse effects.   MEDICAL HISTORY: Past Medical History  Diagnosis Date  . Hypertension   . HYPERLIPIDEMIA 11/05/2007  . ANXIETY 05/13/2009  .  DEPRESSIVE DISORDER 05/06/2008  . HYPERTENSION 11/05/2007  . ALLERGIC RHINITIS 11/05/2007  . CONSTIPATION, CHRONIC 05/13/2009  . UTI 05/06/2008  . BACK PAIN 05/11/2008  . OSTEOPOROSIS 11/05/2007  . INSOMNIA-SLEEP DISORDER-UNSPEC 05/13/2009  . FATIGUE 05/13/2009  . Swelling, mass, or lump in chest 11/05/2007  . LUNG CANCER, HX OF 05/06/2008  . lung ca w/ bone mets dx'd 11/2007    lt hip and spine    ALLERGIES:  is allergic to amoxicillin.  MEDICATIONS:  Current Outpatient Prescriptions  Medication Sig Dispense Refill  . HYDROcodone-acetaminophen (NORCO/VICODIN) 5-325 MG per tablet Take 1 tablet by mouth every 6 (six) hours as needed.      [name] aspirin 81 MG EC tablet Take 81 mg by mouth daily.        . Calcium Carbonate-Vitamin D (CALCIUM-VITAMIN D) 500-200 MG-UNIT per tablet Take 1 tablet by mouth 2 (two) times daily.        . Flaxseed, Linseed, (FLAXSEED OIL) 1000 MG CAPS Take 1,000 mg by mouth daily.        . folic acid (FOLVITE) 1 MG tablet TAKE 1 TABLET BY MOUTH EVERY DAY  90 tablet  0  . HYDROcodone-acetaminophen (NORCO) 10-325 MG per tablet       . HYDROcodone-acetaminophen (NORCO) 5-325 MG per tablet Take 1 tablet by mouth every 6 (six) hours as needed for pain.  30 tablet  0  . LORazepam (ATIVAN) 0.5 MG tablet TAKE 1 TABLET BY MOUTH EVERY 8 HOURS AS NEEDED FOR ANXIETY  40 tablet  1  . losartan-hydrochlorothiazide (  HYZAAR) 50-12.5 MG per tablet Take 1 tablet by mouth daily.  90 tablet  3  . Multiple Vitamin (ANTIOXIDANT A/C/Ortiz PO) Take by mouth daily.        . Omega-3 350 MG CAPS Take by mouth daily.        . polyethylene glycol powder (MIRALAX) powder Take 17 g by mouth daily.        . Probiotic Product (PROBIOTIC FORMULA) CAPS Take 1 capsule by mouth daily.        . simvastatin (ZOCOR) 40 MG tablet Take 1 tablet (40 mg total) by mouth every other day.  90 tablet  3  . vitamin B-12 (CYANOCOBALAMIN) 100 MCG tablet Take 50 mcg by mouth daily.        . vitamin Ortiz ([employer]S VITAMIN Ortiz) 400 UNIT capsule Take  400 Units by mouth daily.        [name] zolendronic acid (ZOMETA) 4 MG/5ML injection Inject 4 mg into the vein once.        [name] zolpidem (AMBIEN CR) 12.5 MG CR tablet Take 1 tablet (12.5 mg total) by mouth at bedtime as needed for sleep.  30 tablet  5    SURGICAL HISTORY:  Past Surgical History  Procedure Date  . Tubal ligation   . S/p lul lung surgury 12/2007  . Lobectomy     REVIEW OF SYSTEMS:  A comprehensive review of systems was negative except for: Musculoskeletal: positive for back pain   PHYSICAL EXAMINATION: General appearance: alert, cooperative and no distress Head: Normocephalic, without obvious abnormality, atraumatic Neck: no adenopathy Lymph nodes: Cervical, supraclavicular, and axillary nodes normal. Resp: clear to auscultation bilaterally Cardio: regular rate and rhythm, S1, S2 normal, no murmur, click, rub or gallop GI: soft, non-tender; bowel sounds normal; no masses,  no organomegaly Extremities: extremities normal, atraumatic, no cyanosis or edema Neurologic: Alert and oriented X 3, normal strength and tone. Normal symmetric reflexes. Normal coordination and gait  ECOG PERFORMANCE STATUS: 0 - Asymptomatic  Blood pressure 111/65, pulse 59, temperature 98.3 F (36.8 C), temperature source Oral, resp. rate 20, height 5\' 5"  (1.651 m), weight 140 lb 4.8 oz (63.64 kg).  LABORATORY DATA: Lab Results  Component Value Date   WBC 5.4 11/05/2012   HGB 13.0 11/05/2012   HCT 38.4 11/05/2012   MCV 95.5 11/05/2012   PLT 271 11/05/2012      Chemistry      Component Value Date/Time   NA 140 11/05/2012 1255   NA 135 10/08/2012 1459   NA 140 04/17/2012 0855   K 3.6 11/05/2012 1255   K 3.7 10/08/2012 1459   K 4.1 04/17/2012 0855   CL 100 11/05/2012 1255   CL 96 10/08/2012 1459   CL 99 04/17/2012 0855   CO2 27 11/05/2012 1255   CO2 29 10/08/2012 1459   CO2 29 04/17/2012 0855   BUN 16.5 11/05/2012 1255   BUN 14 10/08/2012 1459   BUN 15 04/17/2012 0855   CREATININE 1.2* 11/05/2012 1255   CREATININE 0.96  10/08/2012 1459   CREATININE 1.4* 04/17/2012 0855      Component Value Date/Time   CALCIUM 10.4 11/05/2012 1255   CALCIUM 9.9 10/08/2012 1459   CALCIUM 9.5 04/17/2012 0855   ALKPHOS 85 11/05/2012 1255   ALKPHOS 74 10/08/2012 1459   ALKPHOS 68 04/17/2012 0855   AST 24 11/05/2012 1255   AST 22 10/08/2012 1459   AST 29 04/17/2012 0855   ALT 15 11/05/2012 1255   ALT 17 10/08/2012  1459   BILITOT 0.34 11/05/2012 1255   BILITOT 0.2* 10/08/2012 1459   BILITOT 0.60 04/17/2012 0855       RADIOGRAPHIC STUDIES: Ct Chest W Contrast  10/03/2012  *RADIOLOGY REPORT*  Clinical Data:  Metastatic lung cancer  CT CHEST, ABDOMEN AND PELVIS WITH CONTRAST  Technique:  Multidetector CT imaging of the chest, abdomen and pelvis was performed following the standard protocol during bolus administration of intravenous contrast.  Contrast: OMNIPAQUE IOHEXOL 300 MG/ML  SOLN  Comparison:  07/08/2012 the  CT CHEST  Findings:  No axillary lymphadenopathy.  No mediastinal or hilar lymphadenopathy.  The heart is upper normal for size.  Surgical clips are seen in the left hilum.  No pericardial or pleural effusion and no  Lung windows show no parenchymal nodule or mass.  There is some minimal subsegmental atelectasis in the left lung base.  Vascular anatomy is compatible with prior left upper lobe resection.  Sclerotic lesion in the T7 vertebral body is stable.  A tiny sclerotic lesion in the T11 vertebral body is stable.  IMPRESSION: Stable exam.  No evidence for recurrent disease in this patient status post left upper lobectomy.  Stable appearance of sclerotic bone lesions, likely reflecting treated metastases.   CT ABDOMEN AND PELVIS  Findings:  Small hiatal hernia again noted.  No focal abnormalities seen in the liver or spleen.  Stomach is otherwise unremarkable. The duodenum, pancreas, gallbladder, and adrenal glands have normal imaging features.  Kidneys are unremarkable.  No abdominal aortic aneurysm.  No free fluid or lymphadenopathy in the  abdomen.  The abdominal bowel loops are unremarkable aside from slightly prominent stool volume in the colon.  Imaging through the pelvis shows no free intraperitoneal fluid.  No pelvic sidewall lymphadenopathy.  Bladder is unremarkable.  Uterus is normal.  No adnexal mass.  No substantial diverticular change in the left colon.  No colonic diverticulitis.  The terminal ileum is normal.  The appendix is normal.  Stable appearance of sclerotic osseous lesions in the pelvis.  IMPRESSION:  Stable exam.  No new or progressive findings to suggest new or progressive metastatic involvement.   Original Report Authenticated By: Kennith Center, M.D.    ASSESSMENT/PLAN: This is a very pleasant 72 years old white female with metastatic non-small cell lung cancer, adenocarcinoma currently on maintenance chemotherapy with single agent total of 30 cycles. The patient is tolerating her treatment fairly well with no significant adverse effect and no evidence for disease progression. The patient was discussed with Dr. Arbutus Ped. She will proceed with her cycle # 20 of maintenance chemotherapy with Alimta at 500 mg/M2 given every 4 weeks ( total of 32 cycles of maintenance chemotherapy with single agent Alimta). She was given a prescription for Norco 5/325 mg, 1 tablet by mouth every 6 hours as needed for pain, a total of thirty tablets with no refill. She will return in 4 weeks prior to her next cycle of maintenance Alimta.  Tamara Seltzer E, PA-C   She was advised to call me immediately if she has any concerning symptoms in the interval. For the bone disease, she will continue treatment with Zometa as scheduled.  All questions were answered. The patient knows to call the clinic with any problems, questions or concerns. We can certainly see the patient much sooner if necessary.  I spent 20 minutes counseling the patient face to face. The total time spent in the appointment was 30 minutes.

## 2012-11-15 ENCOUNTER — Telehealth: Payer: Self-pay | Admitting: Medical Oncology

## 2012-11-15 NOTE — Telephone Encounter (Signed)
I instructed pt to  increase fluids

## 2012-11-15 NOTE — Telephone Encounter (Signed)
Message copied by Charma Igo on Fri Nov 15, 2012 10:54 AM ------      Message from: Tamara Ortiz      Created: Thu Nov 14, 2012  2:29 PM       Abnormal results, please call and notify patient to push by mouth fluid intake ------

## 2012-11-25 ENCOUNTER — Ambulatory Visit (INDEPENDENT_AMBULATORY_CARE_PROVIDER_SITE_OTHER): Payer: Medicare Other | Admitting: Internal Medicine

## 2012-11-25 ENCOUNTER — Encounter: Payer: Self-pay | Admitting: Internal Medicine

## 2012-11-25 VITALS — BP 110/70 | HR 65 | Temp 98.8°F | Ht 65.0 in | Wt 141.1 lb

## 2012-11-25 DIAGNOSIS — I10 Essential (primary) hypertension: Secondary | ICD-10-CM

## 2012-11-25 DIAGNOSIS — F329 Major depressive disorder, single episode, unspecified: Secondary | ICD-10-CM

## 2012-11-25 DIAGNOSIS — J019 Acute sinusitis, unspecified: Secondary | ICD-10-CM | POA: Insufficient documentation

## 2012-11-25 MED ORDER — HYDROCODONE-HOMATROPINE 5-1.5 MG/5ML PO SYRP: 5.0000 mL | ORAL_SOLUTION | Freq: Four times a day (QID) | ORAL | Status: DC | PRN

## 2012-11-25 MED ORDER — LEVOFLOXACIN 250 MG PO TABS: 250.0000 mg | ORAL_TABLET | Freq: Every day | ORAL | Status: DC

## 2012-11-25 NOTE — Assessment & Plan Note (Signed)
stable overall by history and exam, recent data reviewed with pt, and pt to continue medical treatment as before,  to f/u any worsening symptoms or concerns Lab Results  Component Value Date   WBC 5.4 11/05/2012   HGB 13.0 11/05/2012   HCT 38.4 11/05/2012   PLT 271 11/05/2012   GLUCOSE 98 11/05/2012   CHOL 288* 07/17/2012   TRIG 214.0* 07/17/2012   HDL 70.70 07/17/2012   LDLDIRECT 210.0 07/17/2012   LDLCALC 108* 05/13/2009   ALT 15 11/05/2012   AST 24 11/05/2012   NA 140 11/05/2012   K 3.6 11/05/2012   CL 100 11/05/2012   CREATININE 1.2* 11/05/2012   BUN 16.5 11/05/2012   CO2 27 11/05/2012   TSH 3.09 07/17/2012   INR 1.00 01/24/2010   HGBA1C  Value: 5.4 (NOTE)   The ADA recommends the following therapeutic goals for glycemic   control related to Hgb A1C measurement:   Goal of Therapy:   < 7.0% Hgb A1C   Action Suggested:  > 8.0% Hgb A1C   Ref:  Diabetes Care, 22, Suppl. 1, 1999 10/30/2007

## 2012-11-25 NOTE — Patient Instructions (Addendum)
Please take all new medication as prescribed Please continue all other medications as before, and refills have been done if requested. Please continue your efforts at being more active, low cholesterol diet, and weight control. Please keep your appointments with your specialists as you have planned Thank you for enrolling in MyChart.

## 2012-11-25 NOTE — Assessment & Plan Note (Signed)
stable overall by history and exam, recent data reviewed with pt, and pt to continue medical treatment as before,  to f/u any worsening symptoms or concerns BP Readings from Last 3 Encounters:  11/25/12 110/70  11/05/12 111/65  10/15/12 112/82

## 2012-11-25 NOTE — Progress Notes (Signed)
Subjective:    Patient ID: Tamara Ortiz, female    DOB: Feb 04, 1941, 72 y.o.   MRN: [id number]  HPI   Here with 2-3 days acute onset fever, facial pain, pressure, headache, general weakness and malaise, and greenish d/c, with mild ST and cough, but pt denies chest pain, wheezing, increased sob or doe, orthopnea, PND, increased LE swelling, palpitations, dizziness or syncope.  Has finished 19 cycles chemo for nonsmall cell ca with cisplatin based tx.   Denies worsening depressive symptoms, suicidal ideation, or panic; has ongoing anxiety, controlled recently Past Medical History  Diagnosis Date  . Hypertension   . HYPERLIPIDEMIA 11/05/2007  . ANXIETY 05/13/2009  . DEPRESSIVE DISORDER 05/06/2008  . HYPERTENSION 11/05/2007  . ALLERGIC RHINITIS 11/05/2007  . CONSTIPATION, CHRONIC 05/13/2009  . UTI 05/06/2008  . BACK PAIN 05/11/2008  . OSTEOPOROSIS 11/05/2007  . INSOMNIA-SLEEP DISORDER-UNSPEC 05/13/2009  . FATIGUE 05/13/2009  . Swelling, mass, or lump in chest 11/05/2007  . LUNG CANCER, HX OF 05/06/2008  . lung ca w/ bone mets dx'd 11/2007    lt hip and spine   Past Surgical History  Procedure Laterality Date  . Tubal ligation    . S/p lul lung surgury  12/2007  . Lobectomy      reports that she has never smoked. She has never used smokeless tobacco. She reports that  drinks alcohol. She reports that she does not use illicit drugs. family history includes ALS in her father; Alcohol abuse in her son; and Heart disease in her mother. Allergies  Allergen Reactions  . Amoxicillin     REACTION: hives/itch   Current Outpatient Prescriptions on File Prior to Visit  Medication Sig Dispense Refill  . aspirin 81 MG EC tablet Take 81 mg by mouth daily.        . Calcium Carbonate-Vitamin D (CALCIUM-VITAMIN D) 500-200 MG-UNIT per tablet Take 1 tablet by mouth 2 (two) times daily.        . Flaxseed, Linseed, (FLAXSEED OIL) 1000 MG CAPS Take 1,000 mg by mouth daily.        . folic acid (FOLVITE) 1 MG tablet TAKE 1  TABLET BY MOUTH EVERY DAY  90 tablet  0  . HYDROcodone-acetaminophen (NORCO) 10-325 MG per tablet       . HYDROcodone-acetaminophen (NORCO) 5-325 MG per tablet Take 1 tablet by mouth every 6 (six) hours as needed for pain.  30 tablet  0  . HYDROcodone-acetaminophen (NORCO/VICODIN) 5-325 MG per tablet Take 1 tablet by mouth every 6 (six) hours as needed.      [name] LORazepam (ATIVAN) 0.5 MG tablet TAKE 1 TABLET BY MOUTH EVERY 8 HOURS AS NEEDED FOR ANXIETY  40 tablet  1  . losartan-hydrochlorothiazide (HYZAAR) 50-12.5 MG per tablet Take 1 tablet by mouth daily.  90 tablet  3  . Multiple Vitamin (ANTIOXIDANT A/C/E PO) Take by mouth daily.        . Omega-3 350 MG CAPS Take by mouth daily.        . polyethylene glycol powder (MIRALAX) powder Take 17 g by mouth daily.        . Probiotic Product (PROBIOTIC FORMULA) CAPS Take 1 capsule by mouth daily.        . simvastatin (ZOCOR) 40 MG tablet Take 1 tablet (40 mg total) by mouth every other day.  90 tablet  3  . vitamin B-12 (CYANOCOBALAMIN) 100 MCG tablet Take 50 mcg by mouth daily.        . vitamin E (  CVS VITAMIN E) 400 UNIT capsule Take 400 Units by mouth daily.        [name] zolendronic acid (ZOMETA) 4 MG/5ML injection Inject 4 mg into the vein once.        [name] zolpidem (AMBIEN CR) 12.5 MG CR tablet Take 1 tablet (12.5 mg total) by mouth at bedtime as needed for sleep.  30 tablet  5   No current facility-administered medications on file prior to visit.   Review of Systems  Constitutional: Negative for unexpected weight change, or unusual diaphoresis  HENT: Negative for tinnitus.   Eyes: Negative for photophobia and visual disturbance.  Respiratory: Negative for choking and stridor.   Gastrointestinal: Negative for vomiting and blood in stool.  Genitourinary: Negative for hematuria and decreased urine volume.  Musculoskeletal: Negative for acute joint swelling Skin: Negative for color change and wound.  Neurological: Negative for tremors and numbness  other than noted  Psychiatric/Behavioral: Negative for decreased concentration or  hyperactivity.       Objective:   Physical Exam BP 110/70  Pulse 65  Temp(Src) 98.8 F (37.1 C) (Oral)  Ht 5\' 5"  (1.651 m)  Wt 141 lb 2 oz (64.014 kg)  BMI 23.48 kg/m2  SpO2 97% VS noted, mild ill Constitutional: Pt appears well-developed and well-nourished.  HENT: Head: NCAT.  Right Ear: External ear normal.  Left Ear: External ear normal.  Bilat tm's with mild erythema.  Max sinus areas mild tender.  Pharynx with mild erythema, no exudate Eyes: Conjunctivae and EOM are normal. Pupils are equal, round, and reactive to light.  Neck: Normal range of motion. Neck supple.  Cardiovascular: Normal rate and regular rhythm.   Pulmonary/Chest: Effort normal and breath sounds normal.  Neurological: Pt is alert. Not confused  Skin: Skin is warm. No erythema.  Psychiatric: Pt behavior is normal. Thought content normal. not depressed appearing    Assessment & Plan:

## 2012-11-25 NOTE — Assessment & Plan Note (Signed)
Mild to mod, for antibx course,  to f/u any worsening symptoms or concerns 

## 2012-12-03 ENCOUNTER — Other Ambulatory Visit: Payer: Medicare Other | Admitting: Lab

## 2012-12-03 ENCOUNTER — Ambulatory Visit (HOSPITAL_BASED_OUTPATIENT_CLINIC_OR_DEPARTMENT_OTHER): Payer: Medicare Other

## 2012-12-03 ENCOUNTER — Ambulatory Visit (HOSPITAL_BASED_OUTPATIENT_CLINIC_OR_DEPARTMENT_OTHER): Payer: Medicare Other | Admitting: Physician Assistant

## 2012-12-03 ENCOUNTER — Other Ambulatory Visit (HOSPITAL_BASED_OUTPATIENT_CLINIC_OR_DEPARTMENT_OTHER): Payer: Medicare Other | Admitting: Lab

## 2012-12-03 ENCOUNTER — Encounter: Payer: Self-pay | Admitting: Physician Assistant

## 2012-12-03 VITALS — BP 107/64 | HR 56 | Temp 97.9°F | Resp 18 | Ht 65.0 in | Wt 139.1 lb

## 2012-12-03 DIAGNOSIS — C7951 Secondary malignant neoplasm of bone: Secondary | ICD-10-CM

## 2012-12-03 DIAGNOSIS — C3410 Malignant neoplasm of upper lobe, unspecified bronchus or lung: Secondary | ICD-10-CM

## 2012-12-03 DIAGNOSIS — R600 Localized edema: Secondary | ICD-10-CM

## 2012-12-03 DIAGNOSIS — M7989 Other specified soft tissue disorders: Secondary | ICD-10-CM

## 2012-12-03 DIAGNOSIS — Z5111 Encounter for antineoplastic chemotherapy: Secondary | ICD-10-CM

## 2012-12-03 DIAGNOSIS — C3490 Malignant neoplasm of unspecified part of unspecified bronchus or lung: Secondary | ICD-10-CM

## 2012-12-03 LAB — CBC WITH DIFFERENTIAL/PLATELET
Basophils Absolute: 0.1 10*3/uL (ref 0.0–0.1)
Eosinophils Absolute: 0.1 10*3/uL (ref 0.0–0.5)
HCT: 37.2 % (ref 34.8–46.6)
HGB: 12.6 g/dL (ref 11.6–15.9)
MCV: 94.4 fL (ref 79.5–101.0)
MONO%: 9.7 % (ref 0.0–14.0)
NEUT#: 3.3 10*3/uL (ref 1.5–6.5)
NEUT%: 54.7 % (ref 38.4–76.8)
RDW: 12.6 % (ref 11.2–14.5)
lymph#: 2 10*3/uL (ref 0.9–3.3)

## 2012-12-03 LAB — COMPREHENSIVE METABOLIC PANEL (CC13)
Albumin: 3.6 g/dL (ref 3.5–5.0)
BUN: 12.7 mg/dL (ref 7.0–26.0)
CO2: 28 mEq/L (ref 22–29)
Calcium: 9.9 mg/dL (ref 8.4–10.4)
Glucose: 88 mg/dl (ref 70–99)
Potassium: 3.5 mEq/L (ref 3.5–5.1)
Sodium: 140 mEq/L (ref 136–145)
Total Protein: 7 g/dL (ref 6.4–8.3)

## 2012-12-03 MED ORDER — SODIUM CHLORIDE 0.9 % IV SOLN
500.0000 mg/m2 | Freq: Once | INTRAVENOUS | Status: AC
  Administered 2012-12-03: 875 mg via INTRAVENOUS
  Filled 2012-12-03: qty 35

## 2012-12-03 MED ORDER — HEPARIN SOD (PORK) LOCK FLUSH 100 UNIT/ML IV SOLN
250.0000 [IU] | Freq: Once | INTRAVENOUS | Status: DC | PRN
  Filled 2012-12-03: qty 5

## 2012-12-03 MED ORDER — ONDANSETRON 8 MG/50ML IVPB (CHCC)
8.0000 mg | Freq: Once | INTRAVENOUS | Status: AC
  Administered 2012-12-03: 8 mg via INTRAVENOUS

## 2012-12-03 MED ORDER — ZOLEDRONIC ACID 4 MG/100ML IV SOLN
4.0000 mg | Freq: Once | INTRAVENOUS | Status: AC
  Administered 2012-12-03: 4 mg via INTRAVENOUS
  Filled 2012-12-03: qty 100

## 2012-12-03 MED ORDER — SODIUM CHLORIDE 0.9 % IV SOLN
Freq: Once | INTRAVENOUS | Status: AC
  Administered 2012-12-03: 15:00:00 via INTRAVENOUS

## 2012-12-03 MED ORDER — DEXAMETHASONE SODIUM PHOSPHATE 10 MG/ML IJ SOLN
10.0000 mg | Freq: Once | INTRAMUSCULAR | Status: AC
  Administered 2012-12-03: 10 mg via INTRAVENOUS

## 2012-12-03 NOTE — Patient Instructions (Addendum)
Your complaints of lower extremity swelling will be evaluated with lower extremity Dopplers to look for possible deep vein thrombosis He'll follow with Dr. Arbutus Ped in approximately 4 weeks with a restaging CT scan of your chest abdomen and pelvis to reevaluate her disease

## 2012-12-03 NOTE — Patient Instructions (Addendum)
[facility] Discharge Instructions for Patients Receiving Chemotherapy  Today you received the following chemotherapy agents alimta and zometa  To help prevent nausea and vomiting after your treatment, we encourage you to take your nausea medication if needed Begin taking it at 8 pm and take it as often as prescribed   If you develop nausea and vomiting that is not controlled by your nausea medication, call the clinic. If it is after clinic hours your family physician or the after hours number for the clinic or go to the Emergency Department.   BELOW ARE SYMPTOMS THAT SHOULD BE REPORTED IMMEDIATELY:  *FEVER GREATER THAN 100.5 F  *CHILLS WITH OR WITHOUT FEVER  NAUSEA AND VOMITING THAT IS NOT CONTROLLED WITH YOUR NAUSEA MEDICATION  *UNUSUAL SHORTNESS OF BREATH  *UNUSUAL BRUISING OR BLEEDING  TENDERNESS IN MOUTH AND THROAT WITH OR WITHOUT PRESENCE OF ULCERS  *URINARY PROBLEMS  *BOWEL PROBLEMS  UNUSUAL RASH Items with * indicate a potential emergency and should be followed up as soon as possible.   Feel free to call the clinic you have any questions or concerns. The clinic phone number is [phone].   I have been informed and understand all the instructions given to me. I know to contact the clinic, my physician, or go to the Emergency Department if any problems should occur. I do not have any questions at this time, but understand that I may call the clinic during office hours   should I have any questions or need assistance in obtaining follow up care.    __________________________________________  _____________  __________ Signature of Patient or Authorized Representative            Date                   Time    __________________________________________ Nurse's Signature

## 2012-12-04 ENCOUNTER — Other Ambulatory Visit: Payer: Self-pay | Admitting: Medical Oncology

## 2012-12-04 ENCOUNTER — Telehealth: Payer: Self-pay | Admitting: Internal Medicine

## 2012-12-04 NOTE — Telephone Encounter (Signed)
, °

## 2012-12-05 ENCOUNTER — Other Ambulatory Visit: Payer: Self-pay | Admitting: Physician Assistant

## 2012-12-05 ENCOUNTER — Ambulatory Visit (HOSPITAL_COMMUNITY)
Admission: RE | Admit: 2012-12-05 | Discharge: 2012-12-05 | Disposition: A | Discharge status: Home / Self Care | Payer: Medicare Other | Source: Ambulatory Visit | Attending: Internal Medicine | Admitting: Internal Medicine

## 2012-12-05 DIAGNOSIS — R600 Localized edema: Secondary | ICD-10-CM

## 2012-12-05 DIAGNOSIS — M7989 Other specified soft tissue disorders: Secondary | ICD-10-CM | POA: Insufficient documentation

## 2012-12-05 DIAGNOSIS — C3490 Malignant neoplasm of unspecified part of unspecified bronchus or lung: Secondary | ICD-10-CM

## 2012-12-05 NOTE — Progress Notes (Signed)
[facility] [phone]   [phone]  OFFICE PROGRESS NOTE  [name], MD [address] [facility] [address] 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   PRIOR THERAPY:  1. Status post left upper lobectomy with mediastinal lymph node dissection under the care of Dr. Dorris Fetch on December 10, 2007.  2. Status post 4 cycles of adjuvant chemotherapy with cisplatin and Taxotere. Last dose was given 03/22/2008.  3. Status post 6 cycles of systemic chemotherapy with carboplatin and Alimta for metastatic disease in the bone. The last dose was given July 02, 2010, with stable disease.  4. Status post 12 cycles of maintenance Alimta at 500 mg/sq m given every 3 weeks. The last dose was given 05/08/2011.  CURRENT THERAPY:  1. Maintenance Alimta at 500 mg/sq m given every 4 weeks. The patient is status post 20 cycles.  2. Zometa 4 mg IV given every 2 months for bone metastasis.   INTERVAL HISTORY: Tamara Ortiz 72 y.o. female returns to the clinic today for followup visit. She complains of her legs seeming larger or swollen. This seems to be more apparent as her genes no longer fixed in the legs. I feel tight an uncomfortable. She also has some numbness at night affecting her feet and toes and fingers. She reports a stinging-type feeling in her legs more so on the left than on the right. She denies any trauma to the lower extremities.  She denied having any significant chest pain, shortness breath, cough or hemoptysis. She denied having any significant weight loss or night sweats. She is tolerating her treatment with maintenance Alimta fairly well with no significant adverse effects. She reports that she will be out of town the week prior to her next scheduled  cycle of chemotherapy him and would like to make some scheduling adjustments to accommodate this trip. She states that she will be back in town until later in the day on 12/31/2012 when she is due for her next cycle. She is wondering she can push her next cycle until 01/06/2013 with her CT scan done either on April 3 or April 4. She is due for her Zometa infusion today.  MEDICAL HISTORY: Past Medical History  Diagnosis Date  . Hypertension   . HYPERLIPIDEMIA 11/05/2007  . ANXIETY 05/13/2009  . DEPRESSIVE DISORDER 05/06/2008  . HYPERTENSION 11/05/2007  . ALLERGIC RHINITIS 11/05/2007  . CONSTIPATION, CHRONIC 05/13/2009  . UTI 05/06/2008  . BACK PAIN 05/11/2008  . OSTEOPOROSIS 11/05/2007  . INSOMNIA-SLEEP DISORDER-UNSPEC 05/13/2009  . FATIGUE 05/13/2009  . Swelling, mass, or lump in chest 11/05/2007  . LUNG CANCER, HX OF 05/06/2008  . lung ca w/ bone mets dx'd 11/2007    lt hip and spine    ALLERGIES:  is allergic to amoxicillin.  MEDICATIONS:  Current Outpatient Prescriptions  Medication Sig Dispense Refill  . aspirin 81 MG EC tablet Take 81 mg by mouth daily.        . Calcium Carbonate-Vitamin D (CALCIUM-VITAMIN D) 500-200 MG-UNIT per tablet Take 1 tablet by mouth 2 (two) times daily.        . Flaxseed, Linseed, (FLAXSEED OIL) 1000 MG CAPS Take 1,000 mg by mouth daily.        . folic acid (FOLVITE) 1 MG tablet  TAKE 1 TABLET BY MOUTH EVERY DAY  90 tablet  0  . HYDROcodone-acetaminophen (NORCO) 5-325 MG per tablet Take 1 tablet by mouth every 6 (six) hours as needed for pain.  30 tablet  0  . LORazepam (ATIVAN) 0.5 MG tablet TAKE 1 TABLET BY MOUTH EVERY 8 HOURS AS NEEDED FOR ANXIETY  40 tablet  1  . losartan-hydrochlorothiazide (HYZAAR) 50-12.5 MG per tablet Take 1 tablet by mouth daily.  90 tablet  3  . Multiple Vitamin (ANTIOXIDANT A/C/E PO) Take by mouth daily.        . Omega-3 350 MG CAPS Take by mouth daily.        . polyethylene glycol powder (MIRALAX) powder Take 17 g by mouth daily.        .  Probiotic Product (PROBIOTIC FORMULA) CAPS Take 1 capsule by mouth daily.        . simvastatin (ZOCOR) 40 MG tablet Take 1 tablet (40 mg total) by mouth every other day.  90 tablet  3  . vitamin B-12 (CYANOCOBALAMIN) 100 MCG tablet Take 50 mcg by mouth daily.        . vitamin E ([employer] VITAMIN E) 400 UNIT capsule Take 400 Units by mouth daily.        [name] zolendronic acid (ZOMETA) 4 MG/5ML injection Inject 4 mg into the vein once.        [name] zolpidem (AMBIEN CR) 12.5 MG CR tablet Take 1 tablet (12.5 mg total) by mouth at bedtime as needed for sleep.  30 tablet  5   No current facility-administered medications for this visit.    SURGICAL HISTORY:  Past Surgical History  Procedure Laterality Date  . Tubal ligation    . S/p lul lung surgury  12/2007  . Lobectomy      REVIEW OF SYSTEMS:  A comprehensive review of systems was negative except for: Perceived increased circumference of both lower extremities with mild stinging pain   PHYSICAL EXAMINATION: General appearance: alert, cooperative and no distress Head: Normocephalic, without obvious abnormality, atraumatic Neck: no adenopathy Lymph nodes: Cervical, supraclavicular, and axillary nodes normal. Resp: clear to auscultation bilaterally Cardio: regular rate and rhythm, S1, S2 normal, no murmur, click, rub or gallop GI: soft, non-tender; bowel sounds normal; no masses,  no organomegaly Extremities: Inspection of both lower extremities reveals slight increase in circumference of both legs measuring tape is unavailable at the time of physical examination the left calf appears slightly larger than the right. There are no palpable cords no areas of point tenderness Neurologic: Alert and oriented X 3, normal strength and tone. Normal symmetric reflexes. Normal coordination and gait  ECOG PERFORMANCE STATUS: 0 - Asymptomatic  Blood pressure 107/64, pulse 56, temperature 97.9 F (36.6 C), temperature source Oral, resp. rate 18, height 5\' 5"  (1.651 m),  weight 139 lb 1.6 oz (63.095 kg).  LABORATORY DATA: Lab Results  Component Value Date   WBC 6.0 12/03/2012   HGB 12.6 12/03/2012   HCT 37.2 12/03/2012   MCV 94.4 12/03/2012   PLT 298 12/03/2012      Chemistry      Component Value Date/Time   NA 140 12/03/2012 1246   NA 135 10/08/2012 1459   NA 140 04/17/2012 0855   K 3.5 12/03/2012 1246   K 3.7 10/08/2012 1459   K 4.1 04/17/2012 0855   CL 101 12/03/2012 1246   CL 96 10/08/2012 1459   CL 99 04/17/2012 0855   CO2 28 12/03/2012 1246  CO2 29 10/08/2012 1459   CO2 29 04/17/2012 0855   BUN 12.7 12/03/2012 1246   BUN 14 10/08/2012 1459   BUN 15 04/17/2012 0855   CREATININE 1.0 12/03/2012 1246   CREATININE 0.96 10/08/2012 1459   CREATININE 1.4* 04/17/2012 0855      Component Value Date/Time   CALCIUM 9.9 12/03/2012 1246   CALCIUM 9.9 10/08/2012 1459   CALCIUM 9.5 04/17/2012 0855   ALKPHOS 85 12/03/2012 1246   ALKPHOS 74 10/08/2012 1459   ALKPHOS 68 04/17/2012 0855   AST 22 12/03/2012 1246   AST 22 10/08/2012 1459   AST 29 04/17/2012 0855   ALT 17 12/03/2012 1246   ALT 17 10/08/2012 1459   BILITOT 0.34 12/03/2012 1246   BILITOT 0.2* 10/08/2012 1459   BILITOT 0.60 04/17/2012 0855       RADIOGRAPHIC STUDIES: Ct Chest W Contrast  10/03/2012  *RADIOLOGY REPORT*  Clinical Data:  Metastatic lung cancer  CT CHEST, ABDOMEN AND PELVIS WITH CONTRAST  Technique:  Multidetector CT imaging of the chest, abdomen and pelvis was performed following the standard protocol during bolus administration of intravenous contrast.  Contrast: OMNIPAQUE IOHEXOL 300 MG/ML  SOLN  Comparison:  07/08/2012 the  CT CHEST  Findings:  No axillary lymphadenopathy.  No mediastinal or hilar lymphadenopathy.  The heart is upper normal for size.  Surgical clips are seen in the left hilum.  No pericardial or pleural effusion and no  Lung windows show no parenchymal nodule or mass.  There is some minimal subsegmental atelectasis in the left lung base.  Vascular anatomy is compatible with prior left upper lobe  resection.  Sclerotic lesion in the T7 vertebral body is stable.  A tiny sclerotic lesion in the T11 vertebral body is stable.  IMPRESSION: Stable exam.  No evidence for recurrent disease in this patient status post left upper lobectomy.  Stable appearance of sclerotic bone lesions, likely reflecting treated metastases.   CT ABDOMEN AND PELVIS  Findings:  Small hiatal hernia again noted.  No focal abnormalities seen in the liver or spleen.  Stomach is otherwise unremarkable. The duodenum, pancreas, gallbladder, and adrenal glands have normal imaging features.  Kidneys are unremarkable.  No abdominal aortic aneurysm.  No free fluid or lymphadenopathy in the abdomen.  The abdominal bowel loops are unremarkable aside from slightly prominent stool volume in the colon.  Imaging through the pelvis shows no free intraperitoneal fluid.  No pelvic sidewall lymphadenopathy.  Bladder is unremarkable.  Uterus is normal.  No adnexal mass.  No substantial diverticular change in the left colon.  No colonic diverticulitis.  The terminal ileum is normal.  The appendix is normal.  Stable appearance of sclerotic osseous lesions in the pelvis.  IMPRESSION:  Stable exam.  No new or progressive findings to suggest new or progressive metastatic involvement.   Original Report Authenticated By: Kennith Center, M.D.    ASSESSMENT/PLAN: This is a very pleasant 72 years old white female with metastatic non-small cell lung cancer, adenocarcinoma currently on maintenance chemotherapy with single agent total of 30 cycles. The patient is tolerating her treatment fairly well with no significant adverse effect and no evidence for disease progression. The patient was discussed with Dr. Truett Perna in Dr. Asa Lente absence. She will proceed with her cycle # 21 of maintenance chemotherapy with Alimta at 500 mg/M2 given every 4 weeks ( total of 33 cycles of maintenance chemotherapy with single agent Alimta). We will schedule the patient for bilateral  lower extremity Dopplers to  evaluate for possible deep vein thrombosis regarding her perceived increased lower extremity circumference. She'll follow with Dr. Arbutus Ped in approximately 4 weeks and we were we'll accommodate her schedule change. She will be scheduled for her restaging CT scan of the chest abdomen and pelvis with contrast on either 01/02/2013 or 01/03/2013 followup appointment with Dr. Arbutus Ped in her next chemotherapy cycle on 01/06/2013. Should the Doppler studies be positive for deep vein thromboses the patient will be placed on appropriate anticoagulation therapy. She'll proceed with her Zometa infusion as scheduled today and will be due again in 2 months.  JOHNSON, ADRENA E, PA-C   She was advised to call immediately if she has any concerning symptoms in the interval. For the bone disease, she will continue treatment with Zometa every 2 months as scheduled.  All questions were answered. The patient knows to call the clinic with any problems, questions or concerns. We can certainly see the patient much sooner if necessary.  I spent 20 minutes counseling the patient face to face. The total time spent in the appointment was 30 minutes.

## 2012-12-05 NOTE — Progress Notes (Signed)
*  PRELIMINARY RESULTS* Vascular Ultrasound Lower extremity venous duplex has been completed.  Preliminary findings: Bilaterally no evidence of DVT. Superficial thrombosis noted in bilateral lesser saphenous vein  Called report to Dr. Arbutus Ped.  Farrel Demark, RDMS, RVT  12/05/2012, 9:51 AM

## 2012-12-06 ENCOUNTER — Telehealth: Payer: Self-pay | Admitting: Internal Medicine

## 2012-12-06 NOTE — Telephone Encounter (Signed)
S/w pt re appts for lb/ct 4/3 and lb/fu/tx 4/7.

## 2012-12-11 ENCOUNTER — Other Ambulatory Visit: Payer: Self-pay | Admitting: *Deleted

## 2012-12-11 MED ORDER — FOLIC ACID 1 MG PO TABS: ORAL_TABLET | ORAL | Status: DC

## 2013-01-02 ENCOUNTER — Encounter (HOSPITAL_COMMUNITY): Payer: Self-pay

## 2013-01-02 ENCOUNTER — Ambulatory Visit (HOSPITAL_COMMUNITY)
Admission: RE | Admit: 2013-01-02 | Discharge: 2013-01-02 | Disposition: A | Discharge status: Home / Self Care | Payer: Medicare Other | Source: Ambulatory Visit | Attending: Physician Assistant | Admitting: Physician Assistant

## 2013-01-02 ENCOUNTER — Other Ambulatory Visit (HOSPITAL_BASED_OUTPATIENT_CLINIC_OR_DEPARTMENT_OTHER): Payer: Medicare Other

## 2013-01-02 DIAGNOSIS — Z79899 Other long term (current) drug therapy: Secondary | ICD-10-CM | POA: Insufficient documentation

## 2013-01-02 DIAGNOSIS — C7951 Secondary malignant neoplasm of bone: Secondary | ICD-10-CM | POA: Insufficient documentation

## 2013-01-02 DIAGNOSIS — C7952 Secondary malignant neoplasm of bone marrow: Secondary | ICD-10-CM

## 2013-01-02 DIAGNOSIS — C3410 Malignant neoplasm of upper lobe, unspecified bronchus or lung: Secondary | ICD-10-CM

## 2013-01-02 DIAGNOSIS — C3490 Malignant neoplasm of unspecified part of unspecified bronchus or lung: Secondary | ICD-10-CM

## 2013-01-02 LAB — CBC WITH DIFFERENTIAL/PLATELET
BASO%: 1.2 % (ref 0.0–2.0)
Basophils Absolute: 0.1 10*3/uL (ref 0.0–0.1)
EOS%: 0.9 % (ref 0.0–7.0)
HGB: 11.8 g/dL (ref 11.6–15.9)
MCH: 31.6 pg (ref 25.1–34.0)
MCHC: 33.3 g/dL (ref 31.5–36.0)
MCV: 94.8 fL (ref 79.5–101.0)
MONO%: 10.7 % (ref 0.0–14.0)
RDW: 13.5 % (ref 11.2–14.5)

## 2013-01-02 LAB — COMPREHENSIVE METABOLIC PANEL (CC13)
AST: 25 U/L (ref 5–34)
Albumin: 3.3 g/dL — ABNORMAL LOW (ref 3.5–5.0)
Alkaline Phosphatase: 71 U/L (ref 40–150)
BUN: 15 mg/dL (ref 7.0–26.0)
Creatinine: 1.1 mg/dL (ref 0.6–1.1)
Potassium: 3.8 mEq/L (ref 3.5–5.1)

## 2013-01-02 MED ORDER — IOHEXOL 300 MG/ML  SOLN
100.0000 mL | Freq: Once | INTRAMUSCULAR | Status: AC | PRN
  Administered 2013-01-02: 100 mL via INTRAVENOUS

## 2013-01-06 ENCOUNTER — Ambulatory Visit (HOSPITAL_BASED_OUTPATIENT_CLINIC_OR_DEPARTMENT_OTHER): Payer: Medicare Other | Admitting: Internal Medicine

## 2013-01-06 ENCOUNTER — Telehealth: Payer: Self-pay | Admitting: *Deleted

## 2013-01-06 ENCOUNTER — Ambulatory Visit (HOSPITAL_BASED_OUTPATIENT_CLINIC_OR_DEPARTMENT_OTHER): Payer: Medicare Other

## 2013-01-06 ENCOUNTER — Telehealth: Payer: Self-pay | Admitting: Internal Medicine

## 2013-01-06 ENCOUNTER — Encounter: Payer: Self-pay | Admitting: Internal Medicine

## 2013-01-06 ENCOUNTER — Other Ambulatory Visit (HOSPITAL_BASED_OUTPATIENT_CLINIC_OR_DEPARTMENT_OTHER): Payer: Medicare Other

## 2013-01-06 DIAGNOSIS — C3490 Malignant neoplasm of unspecified part of unspecified bronchus or lung: Secondary | ICD-10-CM

## 2013-01-06 DIAGNOSIS — C7952 Secondary malignant neoplasm of bone marrow: Secondary | ICD-10-CM

## 2013-01-06 DIAGNOSIS — C3410 Malignant neoplasm of upper lobe, unspecified bronchus or lung: Secondary | ICD-10-CM

## 2013-01-06 DIAGNOSIS — Z5111 Encounter for antineoplastic chemotherapy: Secondary | ICD-10-CM

## 2013-01-06 DIAGNOSIS — C7951 Secondary malignant neoplasm of bone: Secondary | ICD-10-CM

## 2013-01-06 LAB — CBC WITH DIFFERENTIAL/PLATELET
Basophils Absolute: 0 10*3/uL (ref 0.0–0.1)
EOS%: 1.4 % (ref 0.0–7.0)
HCT: 36.5 % (ref 34.8–46.6)
HGB: 12.3 g/dL (ref 11.6–15.9)
LYMPH%: 38.8 % (ref 14.0–49.7)
MCH: 32 pg (ref 25.1–34.0)
MCV: 95.1 fL (ref 79.5–101.0)
MONO%: 10 % (ref 0.0–14.0)
NEUT%: 48.8 % (ref 38.4–76.8)

## 2013-01-06 LAB — COMPREHENSIVE METABOLIC PANEL (CC13)
Albumin: 3.7 g/dL (ref 3.5–5.0)
Alkaline Phosphatase: 73 U/L (ref 40–150)
BUN: 17 mg/dL (ref 7.0–26.0)
Glucose: 88 mg/dl (ref 70–99)
Total Bilirubin: 0.51 mg/dL (ref 0.20–1.20)

## 2013-01-06 MED ORDER — ONDANSETRON 8 MG/50ML IVPB (CHCC)
8.0000 mg | Freq: Once | INTRAVENOUS | Status: AC
  Administered 2013-01-06: 8 mg via INTRAVENOUS

## 2013-01-06 MED ORDER — DEXAMETHASONE SODIUM PHOSPHATE 10 MG/ML IJ SOLN
10.0000 mg | Freq: Once | INTRAMUSCULAR | Status: AC
  Administered 2013-01-06: 10 mg via INTRAVENOUS

## 2013-01-06 MED ORDER — SODIUM CHLORIDE 0.9 % IV SOLN
Freq: Once | INTRAVENOUS | Status: AC
  Administered 2013-01-06: 10:00:00 via INTRAVENOUS

## 2013-01-06 MED ORDER — SODIUM CHLORIDE 0.9 % IV SOLN
500.0000 mg/m2 | Freq: Once | INTRAVENOUS | Status: AC
  Administered 2013-01-06: 875 mg via INTRAVENOUS
  Filled 2013-01-06: qty 35

## 2013-01-06 NOTE — Telephone Encounter (Signed)
Per staff phone call and POF I have schedueld appts.  [name]  

## 2013-01-06 NOTE — Progress Notes (Signed)
[facility] [phone]   Fax:([phone]  OFFICE PROGRESS NOTE  Tamara Barre, Tamara Ortiz [address] [facility] [address] 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   PRIOR THERAPY:  1. Status post left upper lobectomy with mediastinal lymph node dissection under the care of Dr. Dorris Fetch on December 10, 2007.  2. Status post 4 cycles of adjuvant chemotherapy with cisplatin and Taxotere. Last dose was given 03/22/2008.  3. Status post 6 cycles of systemic chemotherapy with carboplatin and Alimta for metastatic disease in the bone. The last dose was given July 02, 2010, with stable disease.  4. Status post 12 cycles of maintenance Alimta at 500 mg/sq m given every 3 weeks. The last dose was given 05/08/2011.  CURRENT THERAPY:  1. Maintenance Alimta at 500 mg/sq m given every 4 weeks. The patient is status post 21 cycles.  2. Zometa 4 mg IV given every 2 months for bone metastasis.    INTERVAL HISTORY: Tamara Ortiz 72 y.o. female returns to the clinic today for followup visit. The patient is tolerating her current systemic chemotherapy with maintenance Alimta fairly well with no significant adverse effects. She denied having any significant fatigue weakness. She has no chest pain, shortness breath, cough or hemoptysis. The patient denied having any significant weight loss or night sweats. She had repeat CT scan of the chest, abdomen and pelvis performed recently and she is here for evaluation and discussion of her scan results.  MEDICAL HISTORY: Past Medical History  Diagnosis Date  . Hypertension   . HYPERLIPIDEMIA 11/05/2007  . ANXIETY 05/13/2009  . DEPRESSIVE DISORDER 05/06/2008  . HYPERTENSION 11/05/2007  . ALLERGIC RHINITIS 11/05/2007  . CONSTIPATION,  CHRONIC 05/13/2009  . UTI 05/06/2008  . BACK PAIN 05/11/2008  . OSTEOPOROSIS 11/05/2007  . INSOMNIA-SLEEP DISORDER-UNSPEC 05/13/2009  . FATIGUE 05/13/2009  . Swelling, mass, or lump in chest 11/05/2007  . LUNG CANCER, HX OF 05/06/2008  . lung ca w/ bone mets dx'd 11/2007    lt hip and spine    ALLERGIES:  is allergic to amoxicillin.  MEDICATIONS:  Current Outpatient Prescriptions  Medication Sig Dispense Refill  . aspirin 81 MG EC tablet Take 81 mg by mouth daily.        . Calcium Carbonate-Vitamin D (CALCIUM-VITAMIN D) 500-200 MG-UNIT per tablet Take 1 tablet by mouth 2 (two) times daily.        . Flaxseed, Linseed, (FLAXSEED OIL) 1000 MG CAPS Take 1,000 mg by mouth daily.        . folic acid (FOLVITE) 1 MG tablet TAKE 1 TABLET BY MOUTH EVERY DAY  90 tablet  1  . HYDROcodone-acetaminophen (NORCO) 5-325 MG per tablet Take 1 tablet by mouth every 6 (six) hours as needed for pain.  30 tablet  0  . LORazepam (ATIVAN) 0.5 MG tablet TAKE 1 TABLET BY MOUTH EVERY 8 HOURS AS NEEDED FOR ANXIETY  40 tablet  1  . losartan-hydrochlorothiazide (HYZAAR) 50-12.5 MG per tablet Take 1 tablet by mouth daily.  90 tablet  3  . Multiple Vitamin (ANTIOXIDANT A/C/E PO) Take by mouth daily.        . Omega-3 350 MG CAPS Take by mouth daily.        . polyethylene glycol powder (MIRALAX) powder  Take 17 g by mouth daily.        . Probiotic Product (PROBIOTIC FORMULA) CAPS Take 1 capsule by mouth daily.        . simvastatin (ZOCOR) 40 MG tablet Take 1 tablet (40 mg total) by mouth every other day.  90 tablet  3  . vitamin B-12 (CYANOCOBALAMIN) 100 MCG tablet Take 50 mcg by mouth daily.        . vitamin E ([employer]S VITAMIN E) 400 UNIT capsule Take 400 Units by mouth daily.        [name] zolendronic acid (ZOMETA) 4 MG/5ML injection Inject 4 mg into the vein once.        [name] zolpidem (AMBIEN CR) 12.5 MG CR tablet Take 1 tablet (12.5 mg total) by mouth at bedtime as needed for sleep.  30 tablet  5   No current facility-administered  medications for this visit.    SURGICAL HISTORY:  Past Surgical History  Procedure Laterality Date  . Tubal ligation    . S/p lul lung surgury  12/2007  . Lobectomy      REVIEW OF SYSTEMS:  A comprehensive review of systems was negative.   PHYSICAL EXAMINATION: General appearance: alert, cooperative and no distress Head: Normocephalic, without obvious abnormality, atraumatic Neck: no adenopathy Lymph nodes: Cervical, supraclavicular, and axillary nodes normal. Resp: clear to auscultation bilaterally Cardio: regular rate and rhythm, S1, S2 normal, no murmur, click, rub or gallop GI: soft, non-tender; bowel sounds normal; no masses,  no organomegaly Extremities: extremities normal, atraumatic, no cyanosis or edema Neurologic: Alert and oriented X 3, normal strength and tone. Normal symmetric reflexes. Normal coordination and gait  ECOG PERFORMANCE STATUS: 0 - Asymptomatic  Blood pressure 118/71, pulse 65, temperature 98.2 F (36.8 C), temperature source Oral, resp. rate 20, height 5\' 5"  (1.651 m), weight 139 lb 6.4 oz (63.231 kg).  LABORATORY DATA: Lab Results  Component Value Date   WBC 4.2 01/06/2013   HGB 12.3 01/06/2013   HCT 36.5 01/06/2013   MCV 95.1 01/06/2013   PLT 226 01/06/2013      Chemistry      Component Value Date/Time   NA 140 01/02/2013 0837   NA 135 10/08/2012 1459   NA 140 04/17/2012 0855   K 3.8 01/02/2013 0837   K 3.7 10/08/2012 1459   K 4.1 04/17/2012 0855   CL 100 01/02/2013 0837   CL 96 10/08/2012 1459   CL 99 04/17/2012 0855   CO2 30* 01/02/2013 0837   CO2 29 10/08/2012 1459   CO2 29 04/17/2012 0855   BUN 15.0 01/02/2013 0837   BUN 14 10/08/2012 1459   BUN 15 04/17/2012 0855   CREATININE 1.1 01/02/2013 0837   CREATININE 0.96 10/08/2012 1459   CREATININE 1.4* 04/17/2012 0855      Component Value Date/Time   CALCIUM 9.4 01/02/2013 0837   CALCIUM 9.9 10/08/2012 1459   CALCIUM 9.5 04/17/2012 0855   ALKPHOS 71 01/02/2013 0837   ALKPHOS 74 10/08/2012 1459   ALKPHOS 68 04/17/2012 0855     AST 25 01/02/2013 0837   AST 22 10/08/2012 1459   AST 29 04/17/2012 0855   ALT 19 01/02/2013 0837   ALT 17 10/08/2012 1459   BILITOT 0.54 01/02/2013 0837   BILITOT 0.2* 10/08/2012 1459   BILITOT 0.60 04/17/2012 0855       RADIOGRAPHIC STUDIES: Ct Chest W Contrast  01/02/2013  *RADIOLOGY REPORT*  Clinical Data:  Follow-up metastatic lung carcinoma.  Ongoing chemotherapy.  Restaging.  CT CHEST, ABDOMEN AND PELVIS WITH CONTRAST  Technique:  Multidetector CT imaging of the chest, abdomen and pelvis was performed following the standard protocol during bolus administration of intravenous contrast.  Contrast: OMNIPAQUE IOHEXOL 300 MG/ML  SOLN  Comparison:  10/03/2012   CT CHEST  Findings:  Stable postsurgical changes noted in the left lung.  No suspicious pulmonary nodules or masses are identified.  No evidence of acute infiltrate or central endobronchial lesion.  No evidence of pleural or pericardial effusion.  No evidence of hilar or mediastinal masses.  No adenopathy seen elsewhere within the thorax.  Several small sclerotic metastases in the thoracic spine remain stable.  IMPRESSION:  1.  No evidence of soft tissue recurrence or metastatic disease within the thorax. 2.  Stable appearance of sclerotic bone metastases.   CT ABDOMEN AND PELVIS  Findings:  No evidence of masses or lymphadenopathy within the abdomen or pelvis the liver, gallbladder, pancreas, spleen, adrenal glands, and kidneys are normal appearance.  No evidence of hydronephrosis.  Uterus and adnexal regions are unremarkable in appearance.  No inflammatory process or abnormal fluid collections are identified. No evidence of bowel wall thickening or dilatation.  Sclerotic bone metastases in the pelvis are stable in appearance.  IMPRESSION:  1.  No evidence of soft tissue metastatic disease within the abdomen or pelvis. 2.  Stable sclerotic pelvic bone metastases.   Original Report Authenticated By: Myles Rosenthal, M.D.    ASSESSMENT: This is a very  pleasant 72 years old white female with metastatic non-small cell lung cancer currently on maintenance chemotherapy with Alimta 500 mg/M2 every 4 weeks and she is tolerating it fairly well. The patient has no evidence for disease progression on his recent scan.    PLAN: I discussed the scan results with the patient today. I recommended for her to continue treatment with maintenance Alimta with the same dose. The patient had several questions about her prognosis as well as the possibility of taking break from chemotherapy. I answered her questions to her satisfactions and she decided to continue his chemotherapy he has a scheduled every 4 weeks For the bone metastasis, the patient will continue on treatment with Zometa every 4 weeks  All questions were answered. The patient knows to call the clinic with any problems, questions or concerns. We can certainly see the patient much sooner if necessary.  I spent 15 minutes counseling the patient face to face. The total time spent in the appointment was 25 minutes.

## 2013-01-06 NOTE — Patient Instructions (Signed)
No evidence for disease progression on his recent scan. Continue maintenance chemotherapy with Alimta

## 2013-01-06 NOTE — Patient Instructions (Addendum)
[facility] Discharge Instructions for Patients Receiving Chemotherapy  Today you received the following chemotherapy agents:  Alimta  To help prevent nausea and vomiting after your treatment, we encourage you to take your nausea medication as ordered per MD.    If you develop nausea and vomiting that is not controlled by your nausea medication, call the clinic. If it is after clinic hours your family physician or the after hours number for the clinic or go to the Emergency Department.   BELOW ARE SYMPTOMS THAT SHOULD BE REPORTED IMMEDIATELY:  *FEVER GREATER THAN 100.5 F  *CHILLS WITH OR WITHOUT FEVER  NAUSEA AND VOMITING THAT IS NOT CONTROLLED WITH YOUR NAUSEA MEDICATION  *UNUSUAL SHORTNESS OF BREATH  *UNUSUAL BRUISING OR BLEEDING  TENDERNESS IN MOUTH AND THROAT WITH OR WITHOUT PRESENCE OF ULCERS  *URINARY PROBLEMS  *BOWEL PROBLEMS  UNUSUAL RASH Items with * indicate a potential emergency and should be followed up as soon as possible.   Please let the nurse know about any problems that you may have experienced. Feel free to call the clinic you have any questions or concerns. The clinic phone number is [phone].   I have been informed and understand all the instructions given to me. I know to contact the clinic, my physician, or go to the Emergency Department if any problems should occur. I do not have any questions at this time, but understand that I may call the clinic during office hours   should I have any questions or need assistance in obtaining follow up care.    __________________________________________  _____________  __________ Signature of Patient or Authorized Representative            Date                   Time    __________________________________________ Nurse's Signature    

## 2013-01-14 ENCOUNTER — Other Ambulatory Visit: Payer: Self-pay | Admitting: Internal Medicine

## 2013-01-14 NOTE — Telephone Encounter (Signed)
Faxed hardcopy to [facility].

## 2013-01-14 NOTE — Telephone Encounter (Signed)
Done hardcopy to robin  

## 2013-01-20 ENCOUNTER — Other Ambulatory Visit: Payer: Self-pay | Admitting: Internal Medicine

## 2013-01-21 ENCOUNTER — Other Ambulatory Visit: Payer: Self-pay | Admitting: Internal Medicine

## 2013-01-21 NOTE — Telephone Encounter (Signed)
Caller: Tamara Ortiz/Patient; Phone: [phone]; Reason for Call: Caller needs authorization for Ambien refill.  Please call [employer] at [phone].  Caller advised she does need this refill urgently since leaving town on 01/24/2013.  Thank you,

## 2013-01-22 ENCOUNTER — Telehealth: Payer: Self-pay

## 2013-01-22 NOTE — Telephone Encounter (Signed)
Prior authorization form filled out for Zolpidem Tartrate ER 12.5 mg and waiting for Dr Jonny Ruiz signature.

## 2013-01-23 ENCOUNTER — Other Ambulatory Visit: Payer: Self-pay | Admitting: Internal Medicine

## 2013-01-23 NOTE — Telephone Encounter (Signed)
Done hardcopy to robin  

## 2013-01-23 NOTE — Telephone Encounter (Signed)
Faxed hardcopy to pharmacy. 

## 2013-01-24 ENCOUNTER — Telehealth: Payer: Self-pay

## 2013-01-24 NOTE — Telephone Encounter (Signed)
[employer] Medicare Rx Part D denied PA for Zolipdem Tart ER 12.5 mg tab. Stated [employer]  That this medication is potentially inappropriate medication in older adults.  Advise on alternative

## 2013-01-24 NOTE — Telephone Encounter (Signed)
Noted, ok in this case

## 2013-01-27 ENCOUNTER — Telehealth: Payer: Self-pay | Admitting: *Deleted

## 2013-01-27 MED ORDER — ZOLPIDEM TARTRATE 10 MG PO TABS: 10.0000 mg | ORAL_TABLET | Freq: Every evening | ORAL | Status: DC | PRN

## 2013-01-27 NOTE — Telephone Encounter (Signed)
See 01/24/13 phone note regarding PA on this medication and advise for this patient

## 2013-01-27 NOTE — Telephone Encounter (Signed)
Pt is upset about her Zolpidem. She states she has been waiting over a week for this to be resolved. Please call her.

## 2013-01-27 NOTE — Telephone Encounter (Signed)
ambien ER 12.5 was rejected by insurance  Ok to try change to [place] 10 qhs prn - Done hardcopy to robin[facility]

## 2013-01-28 ENCOUNTER — Telehealth: Payer: Self-pay | Admitting: Internal Medicine

## 2013-01-28 MED ORDER — TEMAZEPAM 30 MG PO CAPS: 30.0000 mg | ORAL_CAPSULE | Freq: Every evening | ORAL | Status: DC | PRN

## 2013-01-28 NOTE — Telephone Encounter (Signed)
ambien 10 not approved per insurance  Ok for temazepam 30 qhs prn - Done hardcopy to [employer]

## 2013-01-28 NOTE — Telephone Encounter (Signed)
Called left messge to call back

## 2013-01-28 NOTE — Telephone Encounter (Signed)
Called the patient to inform and [facility]

## 2013-01-28 NOTE — Telephone Encounter (Signed)
Called the patient informed of PA denial and new medication faxed to [facility] [address].

## 2013-01-31 ENCOUNTER — Encounter: Payer: Self-pay | Admitting: Pharmacist

## 2013-02-03 ENCOUNTER — Encounter: Payer: Self-pay | Admitting: Internal Medicine

## 2013-02-03 ENCOUNTER — Other Ambulatory Visit: Payer: Medicare Other | Admitting: Lab

## 2013-02-03 ENCOUNTER — Ambulatory Visit (HOSPITAL_BASED_OUTPATIENT_CLINIC_OR_DEPARTMENT_OTHER): Payer: Medicare Other

## 2013-02-03 ENCOUNTER — Telehealth: Payer: Self-pay | Admitting: Internal Medicine

## 2013-02-03 ENCOUNTER — Ambulatory Visit (HOSPITAL_BASED_OUTPATIENT_CLINIC_OR_DEPARTMENT_OTHER): Payer: Medicare Other | Admitting: Internal Medicine

## 2013-02-03 ENCOUNTER — Other Ambulatory Visit (HOSPITAL_BASED_OUTPATIENT_CLINIC_OR_DEPARTMENT_OTHER): Payer: Medicare Other | Admitting: Lab

## 2013-02-03 DIAGNOSIS — C3410 Malignant neoplasm of upper lobe, unspecified bronchus or lung: Secondary | ICD-10-CM

## 2013-02-03 DIAGNOSIS — C7952 Secondary malignant neoplasm of bone marrow: Secondary | ICD-10-CM

## 2013-02-03 DIAGNOSIS — C7951 Secondary malignant neoplasm of bone: Secondary | ICD-10-CM

## 2013-02-03 DIAGNOSIS — Z5111 Encounter for antineoplastic chemotherapy: Secondary | ICD-10-CM

## 2013-02-03 DIAGNOSIS — C3490 Malignant neoplasm of unspecified part of unspecified bronchus or lung: Secondary | ICD-10-CM

## 2013-02-03 LAB — COMPREHENSIVE METABOLIC PANEL (CC13)
Albumin: 3.4 g/dL — ABNORMAL LOW (ref 3.5–5.0)
CO2: 27 mEq/L (ref 22–29)
Calcium: 9.7 mg/dL (ref 8.4–10.4)
Chloride: 103 mEq/L (ref 98–107)
Glucose: 95 mg/dl (ref 70–99)
Sodium: 139 mEq/L (ref 136–145)
Total Bilirubin: 0.48 mg/dL (ref 0.20–1.20)
Total Protein: 7 g/dL (ref 6.4–8.3)

## 2013-02-03 LAB — CBC WITH DIFFERENTIAL/PLATELET
Basophils Absolute: 0 10*3/uL (ref 0.0–0.1)
Eosinophils Absolute: 0 10*3/uL (ref 0.0–0.5)
HGB: 12.4 g/dL (ref 11.6–15.9)
MCV: 95.7 fL (ref 79.5–101.0)
MONO%: 8.7 % (ref 0.0–14.0)
NEUT#: 2.2 10*3/uL (ref 1.5–6.5)
Platelets: 221 10*3/uL (ref 145–400)
RDW: 14 % (ref 11.2–14.5)

## 2013-02-03 MED ORDER — DEXAMETHASONE SODIUM PHOSPHATE 10 MG/ML IJ SOLN
10.0000 mg | Freq: Once | INTRAMUSCULAR | Status: AC
  Administered 2013-02-03: 10 mg via INTRAVENOUS

## 2013-02-03 MED ORDER — SODIUM CHLORIDE 0.9 % IV SOLN
500.0000 mg/m2 | Freq: Once | INTRAVENOUS | Status: AC
  Administered 2013-02-03: 875 mg via INTRAVENOUS
  Filled 2013-02-03: qty 35

## 2013-02-03 MED ORDER — ONDANSETRON 8 MG/50ML IVPB (CHCC)
8.0000 mg | Freq: Once | INTRAVENOUS | Status: AC
  Administered 2013-02-03: 8 mg via INTRAVENOUS

## 2013-02-03 MED ORDER — SODIUM CHLORIDE 0.9 % IV SOLN
Freq: Once | INTRAVENOUS | Status: AC
  Administered 2013-02-03: 13:00:00 via INTRAVENOUS

## 2013-02-03 MED ORDER — CYANOCOBALAMIN 1000 MCG/ML IJ SOLN: 1000.0000 ug | Freq: Once | INTRAMUSCULAR | Status: DC

## 2013-02-03 MED ORDER — ZOLEDRONIC ACID 4 MG/100ML IV SOLN
4.0000 mg | Freq: Once | INTRAVENOUS | Status: AC
  Administered 2013-02-03: 4 mg via INTRAVENOUS
  Filled 2013-02-03: qty 100

## 2013-02-03 NOTE — Patient Instructions (Signed)
Continue maintenance chemotherapy with Alimta as scheduled. Followup visit in one month with the next cycle of chemotherapy.

## 2013-02-03 NOTE — Progress Notes (Signed)
[facility] Telephone:([phone]   Fax:([phone]) [phone]  OFFICE PROGRESS NOTE  [name], MD 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   PRIOR THERAPY:  1. Status post left upper lobectomy with mediastinal lymph node dissection under the care of Dr. Dorris Fetch on December 10, 2007.  2. Status post 4 cycles of adjuvant chemotherapy with cisplatin and Taxotere. Last dose was given 03/22/2008.  3. Status post 6 cycles of systemic chemotherapy with carboplatin and Alimta for metastatic disease in the bone. The last dose was given July 02, 2010, with stable disease.  4. Status post 12 cycles of maintenance Alimta at 500 mg/sq m given every 3 weeks. The last dose was given 05/08/2011.  CURRENT THERAPY:  1. Maintenance Alimta at 500 mg/sq m given every 4 weeks. The patient is status post 19 cycles.  2. Zometa 4 mg IV given every 2 months for bone metastasis.   INTERVAL HISTORY: Tamara Ortiz 72 y.o. female returns to the clinic today for followup visit. The patient is feeling fine today with no specific complaints except for mild pain in the left hip area started recently and she is not sure if it's related to arthritis or not. She denied having any significant weight loss or night sweats. She denied having any chest pain, shortness breath, cough or hemoptysis. She tolerated the last cycle of her systemic chemotherapy was maintenance Alimta fairly well. The patient denied having any nausea or vomiting. She denied having any fever or chills.  MEDICAL HISTORY: Past Medical History  Diagnosis Date  . Hypertension   . HYPERLIPIDEMIA 11/05/2007  . ANXIETY 05/13/2009  . DEPRESSIVE DISORDER 05/06/2008  . HYPERTENSION 11/05/2007  . ALLERGIC RHINITIS 11/05/2007  .  CONSTIPATION, CHRONIC 05/13/2009  . UTI 05/06/2008  . BACK PAIN 05/11/2008  . OSTEOPOROSIS 11/05/2007  . INSOMNIA-SLEEP DISORDER-UNSPEC 05/13/2009  . FATIGUE 05/13/2009  . Swelling, mass, or lump in chest 11/05/2007  . LUNG CANCER, HX OF 05/06/2008  . lung ca w/ bone mets dx'd 11/2007    lt hip and spine    ALLERGIES:  is allergic to amoxicillin.  MEDICATIONS:  Current Outpatient Prescriptions  Medication Sig Dispense Refill  . aspirin 81 MG EC tablet Take 81 mg by mouth daily.        . Calcium Carbonate-Vitamin D (CALCIUM-VITAMIN D) 500-200 MG-UNIT per tablet Take 1 tablet by mouth 2 (two) times daily.        . Flaxseed, Linseed, (FLAXSEED OIL) 1000 MG CAPS Take 1,000 mg by mouth daily.        . folic acid (FOLVITE) 1 MG tablet TAKE 1 TABLET BY MOUTH EVERY DAY  90 tablet  1  . HYDROcodone-acetaminophen (NORCO) 5-325 MG per tablet Take 1 tablet by mouth every 6 (six) hours as needed for pain.  30 tablet  0  . LORazepam (ATIVAN) 0.5 MG tablet TAKE 1 TABLET BY MOUTH EVERY 8 HOURS AS NEEDED FOR ANXIETY  40 tablet  1  . losartan-hydrochlorothiazide (HYZAAR) 50-12.5 MG per tablet Take 1 tablet by mouth daily.  90 tablet  3  . Multiple Vitamin (ANTIOXIDANT A/C/E PO) Take by mouth daily.        . Omega-3 350 MG CAPS Take by mouth daily.        [name]  polyethylene glycol powder (MIRALAX) powder Take 17 g by mouth daily.        . Probiotic Product (PROBIOTIC FORMULA) CAPS Take 1 capsule by mouth daily.        . simvastatin (ZOCOR) 40 MG tablet Take 1 tablet (40 mg total) by mouth every other day.  90 tablet  3  . temazepam (RESTORIL) 30 MG capsule Take 1 capsule (30 mg total) by mouth at bedtime as needed for sleep.  30 capsule  3  . vitamin B-12 (CYANOCOBALAMIN) 100 MCG tablet Take 50 mcg by mouth daily.        . vitamin E ([facility] VITAMIN E) 400 UNIT capsule Take 400 Units by mouth daily.        [name] zolendronic acid (ZOMETA) 4 MG/5ML injection Inject 4 mg into the vein once.        [name] zolpidem (AMBIEN) 10 MG tablet  Take 1 tablet (10 mg total) by mouth at bedtime as needed for sleep.  30 tablet  2   No current facility-administered medications for this visit.    SURGICAL HISTORY:  Past Surgical History  Procedure Laterality Date  . Tubal ligation    . S/p lul lung surgury  12/2007  . Lobectomy      REVIEW OF SYSTEMS:  A comprehensive review of systems was negative except for: Musculoskeletal: positive for arthralgias   PHYSICAL EXAMINATION: General appearance: alert, cooperative and no distress Head: Normocephalic, without obvious abnormality, atraumatic Neck: no adenopathy Resp: clear to auscultation bilaterally Cardio: regular rate and rhythm, S1, S2 normal, no murmur, click, rub or gallop GI: soft, non-tender; bowel sounds normal; no masses,  no organomegaly Extremities: extremities normal, atraumatic, no cyanosis or edema  ECOG PERFORMANCE STATUS: 1 - Symptomatic but completely ambulatory  Blood pressure 116/68, pulse 63, temperature 98.6 F (37 C), temperature source Oral, resp. rate 18, height 5\' 5"  (1.651 m), weight 142 lb 1.6 oz (64.456 kg).  LABORATORY DATA: Lab Results  Component Value Date   WBC 4.2 02/03/2013   HGB 12.4 02/03/2013   HCT 37.4 02/03/2013   MCV 95.7 02/03/2013   PLT 221 02/03/2013      Chemistry      Component Value Date/Time   NA 140 01/06/2013 0915   NA 135 10/08/2012 1459   NA 140 04/17/2012 0855   K 3.9 01/06/2013 0915   K 3.7 10/08/2012 1459   K 4.1 04/17/2012 0855   CL 102 01/06/2013 0915   CL 96 10/08/2012 1459   CL 99 04/17/2012 0855   CO2 29 01/06/2013 0915   CO2 29 10/08/2012 1459   CO2 29 04/17/2012 0855   BUN 17.0 01/06/2013 0915   BUN 14 10/08/2012 1459   BUN 15 04/17/2012 0855   CREATININE 1.0 01/06/2013 0915   CREATININE 0.96 10/08/2012 1459   CREATININE 1.4* 04/17/2012 0855      Component Value Date/Time   CALCIUM 10.6* 01/06/2013 0915   CALCIUM 9.9 10/08/2012 1459   CALCIUM 9.5 04/17/2012 0855   ALKPHOS 73 01/06/2013 0915   ALKPHOS 74 10/08/2012 1459   ALKPHOS 68  04/17/2012 0855   AST 22 01/06/2013 0915   AST 22 10/08/2012 1459   AST 29 04/17/2012 0855   ALT 17 01/06/2013 0915   ALT 17 10/08/2012 1459   BILITOT 0.51 01/06/2013 0915   BILITOT 0.2* 10/08/2012 1459   BILITOT 0.60 04/17/2012 0855       RADIOGRAPHIC STUDIES: No results found.  ASSESSMENT: This is a very pleasant  72 years old white female with metastatic non-small cell lung cancer, adenocarcinoma currently on maintenance Alimta 500 mg/M2 every 4 weeks status post 19 cycles. She has left hip pain started recently.   PLAN: I recommended for the patient to continue her current treatment with maintenance Alimta as scheduled. She will start cycle #2 today and she would come back for followup visit in 4 weeks with the next cycle of her chemotherapy. For the hip pain, I gave the patient the option of proceeding with MRI of the left hip now versus waiting until repeat restaging scan of the chest abdomen and pelvis. The patient would like to wait until the restaging scan but she will call me earlier if her pain gets worse. She was advised to call immediately if she has any concerning symptoms in the interval.  All questions were answered. The patient knows to call the clinic with any problems, questions or concerns. We can certainly see the patient much sooner if necessary.

## 2013-02-03 NOTE — Telephone Encounter (Signed)
Gave pt appt for labs and MD, waiting for chemo from [facility] for June 2014

## 2013-02-03 NOTE — Patient Instructions (Addendum)
[facility] Discharge Instructions for Patients Receiving Chemotherapy  Today you received the following chemotherapy agents :  Alimta.  To help prevent nausea and vomiting after your treatment, we encourage you to take your nausea medication as instructed by your physician. Zoledronic Acid injection (Hypercalcemia, Oncology) What is this medicine? ZOLEDRONIC ACID (ZOE le dron ik AS id) lowers the amount of calcium loss from bone. It is used to treat too much calcium in your blood from cancer. It is also used to prevent complications of cancer that has spread to the bone. This medicine may be used for other purposes; ask your health care provider or pharmacist if you have questions. What should I tell my health care provider before I take this medicine? They need to know if you have any of these conditions: -aspirin-sensitive asthma -dental disease -kidney disease -an unusual or allergic reaction to zoledronic acid, other medicines, foods, dyes, or preservatives -pregnant or trying to get pregnant -breast-feeding How should I use this medicine? This medicine is for infusion into a vein. It is given by a health care professional in a hospital or clinic setting. Talk to your pediatrician regarding the use of this medicine in children. Special care may be needed. Overdosage: If you think you have taken too much of this medicine contact a poison control center or emergency room at once. NOTE: This medicine is only for you. Do not share this medicine with others. What if I miss a dose? It is important not to miss your dose. Call your doctor or health care professional if you are unable to keep an appointment. What may interact with this medicine? -certain antibiotics given by injection -NSAIDs, medicines for pain and inflammation, like ibuprofen or naproxen -some diuretics like bumetanide, furosemide -teriparatide -thalidomide This list may not describe all possible  interactions. Give your health care provider a list of all the medicines, herbs, non-prescription drugs, or dietary supplements you use. Also tell them if you smoke, drink alcohol, or use illegal drugs. Some items may interact with your medicine. What should I watch for while using this medicine? Visit your doctor or health care professional for regular checkups. It may be some time before you see the benefit from this medicine. Do not stop taking your medicine unless your doctor tells you to. Your doctor may order blood tests or other tests to see how you are doing. Women should inform their doctor if they wish to become pregnant or think they might be pregnant. There is a potential for serious side effects to an unborn child. Talk to your health care professional or pharmacist for more information. You should make sure that you get enough calcium and vitamin D while you are taking this medicine. Discuss the foods you eat and the vitamins you take with your health care professional. Some people who take this medicine have severe bone, joint, and/or muscle pain. This medicine may also increase your risk for a broken thigh bone. Tell your doctor right away if you have pain in your upper leg or groin. Tell your doctor if you have any pain that does not go away or that gets worse. What side effects may I notice from receiving this medicine? Side effects that you should report to your doctor or health care professional as soon as possible: -allergic reactions like skin rash, itching or hives, swelling of the face, lips, or tongue -anxiety, confusion, or depression -breathing problems -changes in vision -feeling faint or lightheaded, falls -jaw burning, cramping,  pain -muscle cramps, stiffness, or weakness -trouble passing urine or change in the amount of urine Side effects that usually do not require medical attention (report to your doctor or health care professional if they continue or are  bothersome): -bone, joint, or muscle pain -fever -hair loss -irritation at site where injected -loss of appetite -nausea, vomiting -stomach upset -tired This list may not describe all possible side effects. Call your doctor for medical advice about side effects. You may report side effects to FDA at 1-800-FDA-1088. Where should I keep my medicine? This drug is given in a hospital or clinic and will not be stored at home. NOTE: This sheet is a summary. It may not cover all possible information. If you have questions about this medicine, talk to your doctor, pharmacist, or health care provider.  2013, [employer]. (03/17/2011 9:06:58 AM)    If you develop nausea and vomiting that is not controlled by your nausea medication, call the clinic. If it is after clinic hours your family physician or the after hours number for the clinic or go to the Emergency Department.   BELOW ARE SYMPTOMS THAT SHOULD BE REPORTED IMMEDIATELY:  *FEVER GREATER THAN 100.5 F  *CHILLS WITH OR WITHOUT FEVER  NAUSEA AND VOMITING THAT IS NOT CONTROLLED WITH YOUR NAUSEA MEDICATION  *UNUSUAL SHORTNESS OF BREATH  *UNUSUAL BRUISING OR BLEEDING  TENDERNESS IN MOUTH AND THROAT WITH OR WITHOUT PRESENCE OF ULCERS  *URINARY PROBLEMS  *BOWEL PROBLEMS  UNUSUAL RASH Items with * indicate a potential emergency and should be followed up as soon as possible.  One of the nurses will contact you 24 hours after your treatment. Please let the nurse know about any problems that you may have experienced. Feel free to call the clinic you have any questions or concerns. The clinic phone number is [phone].   I have been informed and understand all the instructions given to me. I know to contact the clinic, my physician, or go to the Emergency Department if any problems should occur. I do not have any questions at this time, but understand that I may call the clinic during office hours   should I have any  questions or need assistance in obtaining follow up care.    __________________________________________  _____________  __________ Signature of Patient or Authorized Representative            Date                   Time    __________________________________________ Nurse's Signature

## 2013-02-04 ENCOUNTER — Ambulatory Visit (HOSPITAL_BASED_OUTPATIENT_CLINIC_OR_DEPARTMENT_OTHER): Payer: Medicare Other

## 2013-02-04 VITALS — BP 104/59 | HR 64 | Temp 98.1°F

## 2013-02-04 DIAGNOSIS — C3490 Malignant neoplasm of unspecified part of unspecified bronchus or lung: Secondary | ICD-10-CM

## 2013-02-04 DIAGNOSIS — C3410 Malignant neoplasm of upper lobe, unspecified bronchus or lung: Secondary | ICD-10-CM

## 2013-02-04 MED ORDER — CYANOCOBALAMIN 1000 MCG/ML IJ SOLN
1000.0000 ug | Freq: Once | INTRAMUSCULAR | Status: AC
  Administered 2013-02-04: 1000 ug via INTRAMUSCULAR

## 2013-02-10 ENCOUNTER — Encounter: Payer: Self-pay | Admitting: Internal Medicine

## 2013-02-11 ENCOUNTER — Telehealth: Payer: Self-pay | Admitting: Internal Medicine

## 2013-02-11 MED ORDER — RAMELTEON 8 MG PO TABS: 8.0000 mg | ORAL_TABLET | Freq: Every day | ORAL | Status: DC

## 2013-02-11 NOTE — Telephone Encounter (Signed)
Message copied by Corwin Levins on Tue Feb 11, 2013  1:27 PM ------      Message from: Scharlene Gloss B      Created: Tue Feb 11, 2013  1:25 PM       Received msg. From MD and pt regarding PA on zolpidem.  Review phone notes from 01/24/13, 01/27/13 and 01/28/13 as PA completed and denied and med. Changed.  The patient was notified and informed not sure why she is requesting again? ------

## 2013-02-11 NOTE — Telephone Encounter (Signed)
Patient informed. 

## 2013-02-11 NOTE — Telephone Encounter (Signed)
Ok  Try change to rozerem 8 mg qhs prn

## 2013-02-14 ENCOUNTER — Other Ambulatory Visit: Payer: Self-pay | Admitting: Internal Medicine

## 2013-02-14 MED ORDER — SIMVASTATIN 40 MG PO TABS: 40.0000 mg | ORAL_TABLET | Freq: Every day | ORAL | Status: DC

## 2013-02-16 ENCOUNTER — Encounter: Payer: Self-pay | Admitting: Internal Medicine

## 2013-02-20 ENCOUNTER — Telehealth: Payer: Self-pay | Admitting: Internal Medicine

## 2013-02-20 NOTE — Telephone Encounter (Signed)
Called pt and left message regarding appt on 03/04/13

## 2013-02-26 ENCOUNTER — Encounter: Payer: Self-pay | Admitting: Specialist

## 2013-02-26 NOTE — Progress Notes (Signed)
I talked briefly with Tamara Ortiz on the phone today; she expressed a desire to make an appointment to talk about several things.  Previously she attended the [facility] and said that she misses the opportunity to process things.  We have an appointment for June 3.

## 2013-03-01 ENCOUNTER — Encounter: Payer: Self-pay | Admitting: Internal Medicine

## 2013-03-03 ENCOUNTER — Ambulatory Visit: Payer: Medicare Other | Admitting: Internal Medicine

## 2013-03-03 ENCOUNTER — Other Ambulatory Visit: Payer: Medicare Other | Admitting: Lab

## 2013-03-03 ENCOUNTER — Telehealth: Payer: Self-pay | Admitting: Internal Medicine

## 2013-03-03 MED ORDER — ZOLPIDEM TARTRATE ER 12.5 MG PO TBCR: 12.5000 mg | EXTENDED_RELEASE_TABLET | Freq: Every evening | ORAL | Status: DC | PRN

## 2013-03-03 MED ORDER — MIRTAZAPINE 15 MG PO TABS: 15.0000 mg | ORAL_TABLET | Freq: Every day | ORAL | Status: DC

## 2013-03-03 NOTE — Telephone Encounter (Signed)
Patient informed of MD instructions on medications.   

## 2013-03-03 NOTE — Telephone Encounter (Signed)
After I sent last mychart message, I thought that if the ambien CR is rejected again, we should try Remeron 15 qhs, which helps with sleep, nerves as well as appetitie  I went ahead adn sent erx

## 2013-03-03 NOTE — Telephone Encounter (Signed)
I dont have control over what the insurance approves, we can only fill out the form, and if denied, they except the pt to be changed to another med or pay cash.  There have numerous denials of zolpidem ER this yr I suspect due to the popularity and cost to the insurer.  All I can do is try again

## 2013-03-03 NOTE — Telephone Encounter (Signed)
Faxed hardcopy to Zolpidem 12.5 mg [facility] [address]

## 2013-03-04 ENCOUNTER — Telehealth: Payer: Self-pay | Admitting: Internal Medicine

## 2013-03-04 ENCOUNTER — Ambulatory Visit (HOSPITAL_BASED_OUTPATIENT_CLINIC_OR_DEPARTMENT_OTHER): Payer: Medicare Other

## 2013-03-04 ENCOUNTER — Other Ambulatory Visit: Payer: Self-pay | Admitting: Internal Medicine

## 2013-03-04 ENCOUNTER — Other Ambulatory Visit (HOSPITAL_BASED_OUTPATIENT_CLINIC_OR_DEPARTMENT_OTHER): Payer: Medicare Other | Admitting: Lab

## 2013-03-04 ENCOUNTER — Encounter: Payer: Self-pay | Admitting: Internal Medicine

## 2013-03-04 ENCOUNTER — Telehealth: Payer: Self-pay | Admitting: *Deleted

## 2013-03-04 ENCOUNTER — Ambulatory Visit (HOSPITAL_BASED_OUTPATIENT_CLINIC_OR_DEPARTMENT_OTHER): Payer: Medicare Other | Admitting: Internal Medicine

## 2013-03-04 VITALS — BP 106/70 | HR 58 | Temp 98.0°F | Resp 18 | Ht 65.0 in | Wt 142.9 lb

## 2013-03-04 DIAGNOSIS — C3490 Malignant neoplasm of unspecified part of unspecified bronchus or lung: Secondary | ICD-10-CM

## 2013-03-04 DIAGNOSIS — M549 Dorsalgia, unspecified: Secondary | ICD-10-CM

## 2013-03-04 DIAGNOSIS — C7951 Secondary malignant neoplasm of bone: Secondary | ICD-10-CM

## 2013-03-04 DIAGNOSIS — N390 Urinary tract infection, site not specified: Secondary | ICD-10-CM

## 2013-03-04 DIAGNOSIS — C3410 Malignant neoplasm of upper lobe, unspecified bronchus or lung: Secondary | ICD-10-CM

## 2013-03-04 DIAGNOSIS — R300 Dysuria: Secondary | ICD-10-CM

## 2013-03-04 DIAGNOSIS — Z5111 Encounter for antineoplastic chemotherapy: Secondary | ICD-10-CM

## 2013-03-04 LAB — URINALYSIS, MICROSCOPIC - CHCC
Blood: NEGATIVE
Glucose: NEGATIVE mg/dL
Nitrite: NEGATIVE
RBC / HPF: NEGATIVE (ref 0–2)
Specific Gravity, Urine: 1.005 (ref 1.003–1.035)

## 2013-03-04 LAB — CBC WITH DIFFERENTIAL/PLATELET
Eosinophils Absolute: 0.1 10*3/uL (ref 0.0–0.5)
MONO#: 0.4 10*3/uL (ref 0.1–0.9)
NEUT#: 2.5 10*3/uL (ref 1.5–6.5)
Platelets: 233 10*3/uL (ref 145–400)
RBC: 3.88 10*6/uL (ref 3.70–5.45)
RDW: 13.5 % (ref 11.2–14.5)
WBC: 4.7 10*3/uL (ref 3.9–10.3)
lymph#: 1.7 10*3/uL (ref 0.9–3.3)
nRBC: 0 % (ref 0–0)

## 2013-03-04 LAB — COMPREHENSIVE METABOLIC PANEL (CC13)
Albumin: 3.9 g/dL (ref 3.5–5.0)
Alkaline Phosphatase: 75 U/L (ref 40–150)
BUN: 15.2 mg/dL (ref 7.0–26.0)
CO2: 30 mEq/L — ABNORMAL HIGH (ref 22–29)
Chloride: 100 mEq/L (ref 98–107)
Creatinine: 1 mg/dL (ref 0.6–1.1)
Glucose: 85 mg/dl (ref 70–99)
Potassium: 3.7 mEq/L (ref 3.5–5.1)
Sodium: 140 mEq/L (ref 136–145)
Total Bilirubin: 0.62 mg/dL (ref 0.20–1.20)

## 2013-03-04 MED ORDER — ONDANSETRON 8 MG/50ML IVPB (CHCC)
8.0000 mg | Freq: Once | INTRAVENOUS | Status: AC
  Administered 2013-03-04: 8 mg via INTRAVENOUS

## 2013-03-04 MED ORDER — SODIUM CHLORIDE 0.9 % IV SOLN
Freq: Once | INTRAVENOUS | Status: AC
  Administered 2013-03-04: 11:00:00 via INTRAVENOUS

## 2013-03-04 MED ORDER — SODIUM CHLORIDE 0.9 % IV SOLN
500.0000 mg/m2 | Freq: Once | INTRAVENOUS | Status: AC
  Administered 2013-03-04: 875 mg via INTRAVENOUS
  Filled 2013-03-04: qty 35

## 2013-03-04 MED ORDER — DEXAMETHASONE SODIUM PHOSPHATE 10 MG/ML IJ SOLN
10.0000 mg | Freq: Once | INTRAMUSCULAR | Status: AC
  Administered 2013-03-04: 10 mg via INTRAVENOUS

## 2013-03-04 NOTE — Patient Instructions (Signed)
[facility] Discharge Instructions for Patients Receiving Chemotherapy  Today you received the following chemotherapy agents: alimta  To help prevent nausea and vomiting after your treatment, we encourage you to take your nausea medication.  Take it as often as prescribed.     If you develop nausea and vomiting that is not controlled by your nausea medication, call the clinic. If it is after clinic hours your family physician or the after hours number for the clinic or go to the Emergency Department.   BELOW ARE SYMPTOMS THAT SHOULD BE REPORTED IMMEDIATELY:  *FEVER GREATER THAN 100.5 F  *CHILLS WITH OR WITHOUT FEVER  NAUSEA AND VOMITING THAT IS NOT CONTROLLED WITH YOUR NAUSEA MEDICATION  *UNUSUAL SHORTNESS OF BREATH  *UNUSUAL BRUISING OR BLEEDING  TENDERNESS IN MOUTH AND THROAT WITH OR WITHOUT PRESENCE OF ULCERS  *URINARY PROBLEMS  *BOWEL PROBLEMS  UNUSUAL RASH Items with * indicate a potential emergency and should be followed up as soon as possible.  Feel free to call the clinic you have any questions or concerns. The clinic phone number is [phone].   I have been informed and understand all the instructions given to me. I know to contact the clinic, my physician, or go to the Emergency Department if any problems should occur. I do not have any questions at this time, but understand that I may call the clinic during office hours   should I have any questions or need assistance in obtaining follow up care.    __________________________________________  _____________  __________ Signature of Patient or Authorized Representative            Date                   Time    __________________________________________ Nurse's Signature    

## 2013-03-04 NOTE — Progress Notes (Signed)
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   PRIOR THERAPY:  1. Status post left upper lobectomy with mediastinal lymph node dissection under the care of Dr. Dorris Fetch on December 10, 2007.  2. Status post 4 cycles of adjuvant chemotherapy with cisplatin and Taxotere. Last dose was given 03/22/2008.  3. Status post 6 cycles of systemic chemotherapy with carboplatin and Alimta for metastatic disease in the bone. The last dose was given July 02, 2010, with stable disease.  4. Status post 12 cycles of maintenance Alimta at 500 mg/sq m given every 3 weeks. The last dose was given 05/08/2011.  CURRENT THERAPY:  1. Maintenance Alimta at 500 mg/sq m given every 4 weeks. The patient is status post 20 cycles.  2. Zometa 4 mg IV given every 2 months for bone metastasis.    INTERVAL HISTORY: Tamara Ortiz 72 y.o. female returns to the clinic today for followup visit. The patient is feeling fine today with no specific complaints except for low back pain and questionable dysuria. She tolerated about urinary tract infection. The patient denied having any significant fever or chills. She denied having any chest pain, shortness breath, cough or hemoptysis. She has no weight loss or night sweats. She is tolerating her treatment with maintenance Alimta fairly well.  MEDICAL HISTORY: Past Medical History  Diagnosis Date  . Hypertension   . HYPERLIPIDEMIA 11/05/2007  . ANXIETY 05/13/2009  . DEPRESSIVE DISORDER 05/06/2008  . HYPERTENSION 11/05/2007  . ALLERGIC RHINITIS 11/05/2007  . CONSTIPATION, CHRONIC 05/13/2009  . UTI 05/06/2008  . BACK PAIN 05/11/2008  . OSTEOPOROSIS 11/05/2007    . INSOMNIA-SLEEP DISORDER-UNSPEC 05/13/2009  . FATIGUE 05/13/2009  . Swelling, mass, or lump in chest 11/05/2007  . LUNG CANCER, HX OF 05/06/2008  . lung ca w/ bone mets dx'd 11/2007    lt hip and spine    ALLERGIES:  is allergic to amoxicillin.  MEDICATIONS:  Current Outpatient Prescriptions  Medication Sig Dispense Refill  . aspirin 81 MG EC tablet Take 81 mg by mouth daily.        . Calcium Carbonate-Vitamin D (CALCIUM-VITAMIN D) 500-200 MG-UNIT per tablet Take 1 tablet by mouth 2 (two) times daily.        . Flaxseed, Linseed, (FLAXSEED OIL) 1000 MG CAPS Take 1,000 mg by mouth daily.        . folic acid (FOLVITE) 1 MG tablet TAKE 1 TABLET BY MOUTH EVERY DAY  90 tablet  1  . HYDROcodone-acetaminophen (NORCO) 5-325 MG per tablet Take 1 tablet by mouth every 6 (six) hours as needed for pain.  30 tablet  0  . LORazepam (ATIVAN) 0.5 MG tablet TAKE 1 TABLET BY MOUTH EVERY 8 HOURS AS NEEDED FOR ANXIETY  40 tablet  1  . losartan-hydrochlorothiazide (HYZAAR) 50-12.5 MG per tablet Take 1 tablet by mouth daily.  90 tablet  3  . Multiple Vitamin (ANTIOXIDANT A/C/E PO) Take by mouth daily.        . Omega-3 350 MG CAPS Take by mouth daily.        . polyethylene glycol powder (MIRALAX) powder Take 17 g by mouth daily.        [name]  Probiotic Product (PROBIOTIC FORMULA) CAPS Take 1 capsule by mouth daily.        . simvastatin (ZOCOR) 40 MG tablet Take 1 tablet (40 mg total) by mouth daily.  90 tablet  3  . vitamin B-12 (CYANOCOBALAMIN) 100 MCG tablet Take 50 mcg by mouth daily.        . vitamin E ([employer]S VITAMIN E) 400 UNIT capsule Take 400 Units by mouth daily.        [name] zolendronic acid (ZOMETA) 4 MG/5ML injection Inject 4 mg into the vein once.        [name] zolpidem (AMBIEN CR) 12.5 MG CR tablet Take 1 tablet (12.5 mg total) by mouth at bedtime as needed for sleep.  90 tablet  1   No current facility-administered medications for this visit.    SURGICAL HISTORY:  Past Surgical History  Procedure Laterality  Date  . Tubal ligation    . S/p lul lung surgury  12/2007  . Lobectomy      REVIEW OF SYSTEMS:  A comprehensive review of systems was negative except for: Genitourinary: positive for dysuria   PHYSICAL EXAMINATION: General appearance: alert, cooperative and no distress Head: Normocephalic, without obvious abnormality, atraumatic Neck: no adenopathy Lymph nodes: Cervical, supraclavicular, and axillary nodes normal. Resp: clear to auscultation bilaterally Cardio: regular rate and rhythm, S1, S2 normal, no murmur, click, rub or gallop GI: soft, non-tender; bowel sounds normal; no masses,  no organomegaly Extremities: extremities normal, atraumatic, no cyanosis or edema Neurologic: Alert and oriented X 3, normal strength and tone. Normal symmetric reflexes. Normal coordination and gait  ECOG PERFORMANCE STATUS: 1 - Symptomatic but completely ambulatory  Blood pressure 106/70, pulse 58, temperature 98 F (36.7 C), temperature source Oral, resp. rate 18, height 5\' 5"  (1.651 m), weight 142 lb 14.4 oz (64.819 kg).  LABORATORY DATA: Lab Results  Component Value Date   WBC 4.7 03/04/2013   HGB 12.4 03/04/2013   HCT 37.1 03/04/2013   MCV 95.6 03/04/2013   PLT 233 03/04/2013      Chemistry      Component Value Date/Time   NA 139 02/03/2013 1143   NA 135 10/08/2012 1459   NA 140 04/17/2012 0855   K 4.0 02/03/2013 1143   K 3.7 10/08/2012 1459   K 4.1 04/17/2012 0855   CL 103 02/03/2013 1143   CL 96 10/08/2012 1459   CL 99 04/17/2012 0855   CO2 27 02/03/2013 1143   CO2 29 10/08/2012 1459   CO2 29 04/17/2012 0855   BUN 18.2 02/03/2013 1143   BUN 14 10/08/2012 1459   BUN 15 04/17/2012 0855   CREATININE 1.1 02/03/2013 1143   CREATININE 0.96 10/08/2012 1459   CREATININE 1.4* 04/17/2012 0855      Component Value Date/Time   CALCIUM 9.7 02/03/2013 1143   CALCIUM 9.9 10/08/2012 1459   CALCIUM 9.5 04/17/2012 0855   ALKPHOS 64 02/03/2013 1143   ALKPHOS 74 10/08/2012 1459   ALKPHOS 68 04/17/2012 0855   AST 22 02/03/2013 1143   AST  22 10/08/2012 1459   AST 29 04/17/2012 0855   ALT 19 02/03/2013 1143   ALT 17 10/08/2012 1459   BILITOT 0.48 02/03/2013 1143   BILITOT 0.2* 10/08/2012 1459   BILITOT 0.60 04/17/2012 0855       RADIOGRAPHIC STUDIES: No results found.  ASSESSMENT: This is a very pleasant 72 years old white female with metastatic non-small cell lung cancer, adenocarcinoma with positive EGFR mutation exon 21 currently on  systemic chemotherapy with maintenance Alimta in addition to Zometa every 3 month for bone metastasis.    PLAN: The patient is doing fine today except for dysuria and low back pain. We'll proceed with cycle #21 of her chemotherapy today as scheduled. For dysuria, I will order urine analysis and dizziness any evidence for urinary tract infection, I will start the patient on treatment with ciprofloxacin. She would come back for followup visit in one month with repeat CT scan of the chest, abdomen and pelvis for restaging of her disease. She was advised to call immediately if she has any concerning symptoms in the interval.  All questions were answered. The patient knows to call the clinic with any problems, questions or concerns. We can certainly see the patient much sooner if necessary.

## 2013-03-04 NOTE — Progress Notes (Signed)
Pt c/o discomfort in her lower back and feels she may have a UTI.  Informed Dr Donnald Garre.

## 2013-03-04 NOTE — Telephone Encounter (Signed)
Per staff message and POF I have scheduled appts.  [name]  

## 2013-03-05 ENCOUNTER — Telehealth: Payer: Self-pay

## 2013-03-05 NOTE — Telephone Encounter (Signed)
Called Rosann Auerbach [phone] to initiate PA approval of zolpidem 12.5mg .  Informed [employer] of clincal information they requested.  They informed would take 48 to 72 hours to make decision.  Called the patient to inform PA has been started and will contact her once complete.

## 2013-03-06 ENCOUNTER — Telehealth: Payer: Self-pay

## 2013-03-06 NOTE — Telephone Encounter (Signed)
Received PA Approval for Zolpidem 12.5 mg from 03/05/13 through 03/05/14.  The patient has been notified and pharmacy as well.

## 2013-03-10 ENCOUNTER — Encounter: Payer: Self-pay | Admitting: Specialist

## 2013-03-18 ENCOUNTER — Telehealth: Payer: Self-pay | Admitting: *Deleted

## 2013-03-18 ENCOUNTER — Other Ambulatory Visit: Payer: Self-pay | Admitting: *Deleted

## 2013-03-18 ENCOUNTER — Telehealth: Payer: Self-pay | Admitting: Internal Medicine

## 2013-03-18 NOTE — Telephone Encounter (Signed)
lvm for pt regarding to appt d/t for Lab and est moved to 7.8.14...mailed pt appt sched and letter

## 2013-03-18 NOTE — Progress Notes (Signed)
Pt called stating that she needs her f/u with [facility] and chemo to be on 7/8 because she will be going out of town the week of July 1st.  Onc tx schedule filled out.  SLJ

## 2013-03-18 NOTE — Telephone Encounter (Signed)
Per staff message I have adjusted appt. [name]  

## 2013-04-01 ENCOUNTER — Ambulatory Visit: Payer: Medicare Other

## 2013-04-01 ENCOUNTER — Other Ambulatory Visit: Payer: Medicare Other | Admitting: Lab

## 2013-04-01 ENCOUNTER — Ambulatory Visit (HOSPITAL_COMMUNITY)
Admission: RE | Admit: 2013-04-01 | Discharge: 2013-04-01 | Disposition: A | Discharge status: Home / Self Care | Payer: Medicare Other | Source: Ambulatory Visit | Attending: Internal Medicine | Admitting: Internal Medicine

## 2013-04-01 ENCOUNTER — Ambulatory Visit: Payer: Medicare Other | Admitting: Internal Medicine

## 2013-04-01 ENCOUNTER — Other Ambulatory Visit (HOSPITAL_BASED_OUTPATIENT_CLINIC_OR_DEPARTMENT_OTHER): Payer: Medicare Other

## 2013-04-01 DIAGNOSIS — K449 Diaphragmatic hernia without obstruction or gangrene: Secondary | ICD-10-CM | POA: Insufficient documentation

## 2013-04-01 DIAGNOSIS — I700 Atherosclerosis of aorta: Secondary | ICD-10-CM | POA: Insufficient documentation

## 2013-04-01 DIAGNOSIS — I7090 Unspecified atherosclerosis: Secondary | ICD-10-CM | POA: Insufficient documentation

## 2013-04-01 DIAGNOSIS — C3490 Malignant neoplasm of unspecified part of unspecified bronchus or lung: Secondary | ICD-10-CM | POA: Insufficient documentation

## 2013-04-01 DIAGNOSIS — Z79899 Other long term (current) drug therapy: Secondary | ICD-10-CM | POA: Insufficient documentation

## 2013-04-01 DIAGNOSIS — N390 Urinary tract infection, site not specified: Secondary | ICD-10-CM

## 2013-04-01 DIAGNOSIS — M949 Disorder of cartilage, unspecified: Secondary | ICD-10-CM | POA: Insufficient documentation

## 2013-04-01 DIAGNOSIS — M899 Disorder of bone, unspecified: Secondary | ICD-10-CM | POA: Insufficient documentation

## 2013-04-01 DIAGNOSIS — I2510 Atherosclerotic heart disease of native coronary artery without angina pectoris: Secondary | ICD-10-CM | POA: Insufficient documentation

## 2013-04-01 DIAGNOSIS — Z902 Acquired absence of lung [part of]: Secondary | ICD-10-CM | POA: Insufficient documentation

## 2013-04-01 LAB — CBC WITH DIFFERENTIAL/PLATELET
Eosinophils Absolute: 0 10*3/uL (ref 0.0–0.5)
HGB: 13.5 g/dL (ref 11.6–15.9)
MONO#: 0.4 10*3/uL (ref 0.1–0.9)
MONO%: 9.8 % (ref 0.0–14.0)
NEUT#: 2.3 10*3/uL (ref 1.5–6.5)
RBC: 4.13 10*6/uL (ref 3.70–5.45)
RDW: 13.4 % (ref 11.2–14.5)
WBC: 4.1 10*3/uL (ref 3.9–10.3)
lymph#: 1.4 10*3/uL (ref 0.9–3.3)

## 2013-04-01 LAB — COMPREHENSIVE METABOLIC PANEL (CC13)
Albumin: 3.6 g/dL (ref 3.5–5.0)
Alkaline Phosphatase: 61 U/L (ref 40–150)
CO2: 29 mEq/L (ref 22–29)
Calcium: 9.6 mg/dL (ref 8.4–10.4)
Chloride: 102 mEq/L (ref 98–109)
Glucose: 87 mg/dl (ref 70–140)
Potassium: 4.2 mEq/L (ref 3.5–5.1)
Sodium: 139 mEq/L (ref 136–145)
Total Protein: 7 g/dL (ref 6.4–8.3)

## 2013-04-01 MED ORDER — IOHEXOL 300 MG/ML  SOLN
100.0000 mL | Freq: Once | INTRAMUSCULAR | Status: AC | PRN
  Administered 2013-04-01: 100 mL via INTRAVENOUS

## 2013-04-02 ENCOUNTER — Other Ambulatory Visit: Payer: Medicare Other | Admitting: Lab

## 2013-04-02 ENCOUNTER — Telehealth: Payer: Self-pay | Admitting: *Deleted

## 2013-04-02 NOTE — Telephone Encounter (Signed)
Pt called requesting a Tamara Ortiz duty letter excuse letter.  She will pick it up at f/u visit on 04/08/13.  Letter left at RN desk and will be given to patient at f/u visit.  Copy scanned into EPIC of letter.  SLJ

## 2013-04-08 ENCOUNTER — Other Ambulatory Visit (HOSPITAL_BASED_OUTPATIENT_CLINIC_OR_DEPARTMENT_OTHER): Payer: Medicare Other | Admitting: Lab

## 2013-04-08 ENCOUNTER — Ambulatory Visit: Payer: Medicare Other | Admitting: Internal Medicine

## 2013-04-08 ENCOUNTER — Telehealth: Payer: Self-pay | Admitting: Internal Medicine

## 2013-04-08 ENCOUNTER — Ambulatory Visit (HOSPITAL_BASED_OUTPATIENT_CLINIC_OR_DEPARTMENT_OTHER): Payer: Medicare Other

## 2013-04-08 ENCOUNTER — Ambulatory Visit (HOSPITAL_BASED_OUTPATIENT_CLINIC_OR_DEPARTMENT_OTHER): Payer: Medicare Other | Admitting: Internal Medicine

## 2013-04-08 ENCOUNTER — Encounter: Payer: Self-pay | Admitting: Internal Medicine

## 2013-04-08 VITALS — BP 109/66 | HR 55 | Temp 97.3°F | Resp 18 | Ht 65.0 in | Wt 140.7 lb

## 2013-04-08 DIAGNOSIS — C7951 Secondary malignant neoplasm of bone: Secondary | ICD-10-CM

## 2013-04-08 DIAGNOSIS — N390 Urinary tract infection, site not specified: Secondary | ICD-10-CM

## 2013-04-08 DIAGNOSIS — C3490 Malignant neoplasm of unspecified part of unspecified bronchus or lung: Secondary | ICD-10-CM

## 2013-04-08 DIAGNOSIS — C3410 Malignant neoplasm of upper lobe, unspecified bronchus or lung: Secondary | ICD-10-CM

## 2013-04-08 DIAGNOSIS — C7952 Secondary malignant neoplasm of bone marrow: Secondary | ICD-10-CM

## 2013-04-08 DIAGNOSIS — Z5111 Encounter for antineoplastic chemotherapy: Secondary | ICD-10-CM

## 2013-04-08 LAB — CBC WITH DIFFERENTIAL/PLATELET
EOS%: 0.7 % (ref 0.0–7.0)
Eosinophils Absolute: 0 10*3/uL (ref 0.0–0.5)
MCV: 94.4 fL (ref 79.5–101.0)
MONO%: 10.5 % (ref 0.0–14.0)
NEUT#: 2.2 10*3/uL (ref 1.5–6.5)
RBC: 4.14 10*6/uL (ref 3.70–5.45)
RDW: 12.8 % (ref 11.2–14.5)
lymph#: 1.5 10*3/uL (ref 0.9–3.3)
nRBC: 0 % (ref 0–0)

## 2013-04-08 LAB — COMPREHENSIVE METABOLIC PANEL (CC13)
ALT: 21 U/L (ref 0–55)
Alkaline Phosphatase: 53 U/L (ref 40–150)
CO2: 26 mEq/L (ref 22–29)
Sodium: 140 mEq/L (ref 136–145)
Total Bilirubin: 0.48 mg/dL (ref 0.20–1.20)
Total Protein: 7 g/dL (ref 6.4–8.3)

## 2013-04-08 MED ORDER — SODIUM CHLORIDE 0.9 % IV SOLN
Freq: Once | INTRAVENOUS | Status: AC
  Administered 2013-04-08: 12:00:00 via INTRAVENOUS

## 2013-04-08 MED ORDER — ZOLEDRONIC ACID 4 MG/100ML IV SOLN
4.0000 mg | Freq: Once | INTRAVENOUS | Status: AC
  Administered 2013-04-08: 4 mg via INTRAVENOUS
  Filled 2013-04-08: qty 100

## 2013-04-08 MED ORDER — SODIUM CHLORIDE 0.9 % IV SOLN
500.0000 mg/m2 | Freq: Once | INTRAVENOUS | Status: AC
  Administered 2013-04-08: 875 mg via INTRAVENOUS
  Filled 2013-04-08: qty 35

## 2013-04-08 MED ORDER — DEXAMETHASONE SODIUM PHOSPHATE 10 MG/ML IJ SOLN
10.0000 mg | Freq: Once | INTRAMUSCULAR | Status: AC
  Administered 2013-04-08: 10 mg via INTRAVENOUS

## 2013-04-08 MED ORDER — ONDANSETRON 8 MG/50ML IVPB (CHCC)
8.0000 mg | Freq: Once | INTRAVENOUS | Status: AC
  Administered 2013-04-08: 8 mg via INTRAVENOUS

## 2013-04-08 NOTE — Telephone Encounter (Signed)
gv pt appt schedule for August. Per 7/8 pof chemo every 4wks. Are plan is listed as every 3wks w/next tx due 7/29. appts scheduled for q4w and email to MM to clarify. Pt aware if any changes are made I will contact her.

## 2013-04-08 NOTE — Progress Notes (Signed)
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   PRIOR THERAPY:  1. Status post left upper lobectomy with mediastinal lymph node dissection under the care of Dr. Dorris Fetch on December 10, 2007.  2. Status post 4 cycles of adjuvant chemotherapy with cisplatin and Taxotere. Last dose was given 03/22/2008.  3. Status post 6 cycles of systemic chemotherapy with carboplatin and Alimta for metastatic disease in the bone. The last dose was given July 02, 2010, with stable disease.  4. Status post 12 cycles of maintenance Alimta at 500 mg/sq m given every 3 weeks. The last dose was given 05/08/2011.  CURRENT THERAPY:  1. Maintenance Alimta at 500 mg/sq m given every 4 weeks. The patient is status post 21 cycles.  2. Zometa 4 mg IV given every 2 months for bone metastasis.   INTERVAL HISTORY: Tamara Ortiz 72 y.o. female returns to the clinic today for followup visit. The patient is feeling fine today with no specific complaints. She denied having any significant fatigue or weakness. She has no chest pain, shortness breath, cough or hemoptysis. The patient denied having any significant nausea or vomiting, no fever or chills. She is tolerating her treatment with maintenance Alimta fairly well with no significant adverse effects. She had repeat CT scan of the chest, abdomen and pelvis performed recently and she is here for evaluation and discussion of her scan results.   MEDICAL HISTORY: Past Medical History  Diagnosis Date  . Hypertension   . HYPERLIPIDEMIA 11/05/2007  . ANXIETY 05/13/2009  . DEPRESSIVE DISORDER 05/06/2008  . HYPERTENSION 11/05/2007  . ALLERGIC RHINITIS  11/05/2007  . CONSTIPATION, CHRONIC 05/13/2009  . UTI 05/06/2008  . BACK PAIN 05/11/2008  . OSTEOPOROSIS 11/05/2007  . INSOMNIA-SLEEP DISORDER-UNSPEC 05/13/2009  . FATIGUE 05/13/2009  . Swelling, mass, or lump in chest 11/05/2007  . LUNG CANCER, HX OF 05/06/2008  . lung ca w/ bone mets dx'd 11/2007    lt hip and spine    ALLERGIES:  is allergic to amoxicillin.  MEDICATIONS:  Current Outpatient Prescriptions  Medication Sig Dispense Refill  . aspirin 81 MG EC tablet Take 81 mg by mouth daily.        . Calcium Carbonate-Vitamin D (CALCIUM-VITAMIN D) 500-200 MG-UNIT per tablet Take 1 tablet by mouth 2 (two) times daily.        . Flaxseed, Linseed, (FLAXSEED OIL) 1000 MG CAPS Take 1,000 mg by mouth daily.        . folic acid (FOLVITE) 1 MG tablet TAKE 1 TABLET BY MOUTH EVERY DAY  90 tablet  1  . LORazepam (ATIVAN) 0.5 MG tablet TAKE 1 TABLET BY MOUTH EVERY 8 HOURS AS NEEDED FOR ANXIETY  40 tablet  1  . losartan-hydrochlorothiazide (HYZAAR) 50-12.5 MG per tablet Take 1 tablet by mouth daily.  90 tablet  3  . Multiple Vitamin (ANTIOXIDANT A/C/E PO) Take by mouth daily.        . Omega-3 350 MG CAPS Take by mouth daily.        . polyethylene glycol powder (MIRALAX) powder Take 17 g by mouth daily.        . Probiotic Product (PROBIOTIC FORMULA) CAPS Take  1 capsule by mouth daily.        . simvastatin (ZOCOR) 40 MG tablet Take 1 tablet (40 mg total) by mouth daily.  90 tablet  3  . vitamin B-12 (CYANOCOBALAMIN) 100 MCG tablet Take 50 mcg by mouth daily.        . vitamin E ([employer]S VITAMIN E) 400 UNIT capsule Take 400 Units by mouth daily.        [name] zolendronic acid (ZOMETA) 4 MG/5ML injection Inject 4 mg into the vein once.        [name] zolpidem (AMBIEN CR) 12.5 MG CR tablet Take 1 tablet (12.5 mg total) by mouth at bedtime as needed for sleep.  90 tablet  1  . HYDROcodone-acetaminophen (NORCO) 5-325 MG per tablet Take 1 tablet by mouth every 6 (six) hours as needed for pain.  30 tablet  0   No current  facility-administered medications for this visit.    SURGICAL HISTORY:  Past Surgical History  Procedure Laterality Date  . Tubal ligation    . S/p lul lung surgury  12/2007  . Lobectomy      REVIEW OF SYSTEMS:  A comprehensive review of systems was negative.   PHYSICAL EXAMINATION: General appearance: alert, cooperative and no distress Head: Normocephalic, without obvious abnormality, atraumatic Neck: no adenopathy Lymph nodes: Cervical, supraclavicular, and axillary nodes normal. Resp: clear to auscultation bilaterally Cardio: regular rate and rhythm, S1, S2 normal, no murmur, click, rub or gallop GI: soft, non-tender; bowel sounds normal; no masses,  no organomegaly Extremities: extremities normal, atraumatic, no cyanosis or edema Neurologic: Alert and oriented X 3, normal strength and tone. Normal symmetric reflexes. Normal coordination and gait  ECOG PERFORMANCE STATUS: 1 - Symptomatic but completely ambulatory  Blood pressure 109/66, pulse 55, temperature 97.3 F (36.3 C), temperature source Oral, resp. rate 18, height 5\' 5"  (1.651 m), weight 140 lb 11.2 oz (63.821 kg).  LABORATORY DATA: Lab Results  Component Value Date   WBC 4.2 04/08/2013   HGB 13.2 04/08/2013   HCT 39.1 04/08/2013   MCV 94.4 04/08/2013   PLT 202 04/08/2013      Chemistry      Component Value Date/Time   NA 139 04/01/2013 0936   NA 135 10/08/2012 1459   NA 140 04/17/2012 0855   K 4.2 04/01/2013 0936   K 3.7 10/08/2012 1459   K 4.1 04/17/2012 0855   CL 100 03/04/2013 0901   CL 96 10/08/2012 1459   CL 99 04/17/2012 0855   CO2 29 04/01/2013 0936   CO2 29 10/08/2012 1459   CO2 29 04/17/2012 0855   BUN 14.0 04/01/2013 0936   BUN 14 10/08/2012 1459   BUN 15 04/17/2012 0855   CREATININE 1.1 04/01/2013 0936   CREATININE 0.96 10/08/2012 1459   CREATININE 1.4* 04/17/2012 0855      Component Value Date/Time   CALCIUM 9.6 04/01/2013 0936   CALCIUM 9.9 10/08/2012 1459   CALCIUM 9.5 04/17/2012 0855   ALKPHOS 61 04/01/2013 0936   ALKPHOS  74 10/08/2012 1459   ALKPHOS 68 04/17/2012 0855   AST 23 04/01/2013 0936   AST 22 10/08/2012 1459   AST 29 04/17/2012 0855   ALT 16 04/01/2013 0936   ALT 17 10/08/2012 1459   BILITOT 0.51 04/01/2013 0936   BILITOT 0.2* 10/08/2012 1459   BILITOT 0.60 04/17/2012 0855       RADIOGRAPHIC STUDIES: Ct Chest W Contrast  04/01/2013   *RADIOLOGY REPORT*  Clinical Data:  History of  lung cancer with metastatic disease. Chemotherapy in progress.  CT CHEST, ABDOMEN AND PELVIS WITH CONTRAST  Technique:  Multidetector CT imaging of the chest, abdomen and pelvis was performed following the standard protocol during bolus administration of intravenous contrast.  Contrast: OMNIPAQUE IOHEXOL 300 MG/ML  SOLN  Comparison:  Multiple priors, most recently 01/02/2013.   CT CHEST  Findings:  Mediastinum: Heart size is normal. There is no significant pericardial fluid, thickening or pericardial calcification. There is atherosclerosis of the thoracic aorta, the great vessels of the mediastinum and the coronary arteries, including calcified atherosclerotic plaque in the left main, left anterior descending, left circumflex and right coronary arteries. No pathologically enlarged mediastinal or hilar lymph nodes. Small hiatal hernia.  Lungs/Pleura: Postoperative changes of left upper lobectomy are redemonstrated.  Compensatory hyperexpansion of the left lower lobe is noted.  Several areas of mild pleural-based scarring in the left lower lobe are similar to the prior study.  No suspicious appearing pulmonary nodules or masses are noted.  No acute consolidative air space disease.  No pleural effusions.  Musculoskeletal: Again noted are multiple sclerotic lesions scattered throughout the spine.  These appear similar in size, number and distribution to the prior examination, and are compatible with metastatic disease.  The largest of these lesions is within the T6 vertebral body.  No definite new bony lesions are noted.  IMPRESSION:  1.  Status  post left upper lobectomy without evidence to suggest recurrent disease in the lungs or new metastatic disease to the thorax. 2.  Previously noted sclerotic bony lesions are unchanged, compatible with treated metastatic disease to the bones. 3. Atherosclerosis, including left main and three-vessel coronary artery disease.   Assessment for potential risk factor modification, dietary therapy or pharmacologic therapy may be warranted, if clinically indicated. 4.  Small hiatal hernia.    CT ABDOMEN AND PELVIS  Findings:  Abdomen/Pelvis: The appearance of the liver, gallbladder, pancreas, spleen, bilateral adrenal glands and bilateral kidneys is unremarkable.  Atherosclerosis throughout the abdominal and pelvic vasculature, without definite aneurysm or dissection.  No significant volume of ascites.  No pneumoperitoneum.  No pathologic distension of small bowel.  Normal appendix.  No definite pathologic lymphadenopathy identified within the abdomen and pelvis on today's examination.  Uterus and bilateral ovaries are unremarkable in appearance.  Urinary bladder is normal in appearance.  Musculoskeletal: Multiple sclerotic lesions are again noted throughout the visualized axial skeleton.  The largest of these lesions are in the left iliac bone immediately lateral to the left sacroiliac joint, and in the right side of the L5 vertebral body. These lesions appears similar to prior studies, and are compatible with treated bony metastases.  No definite new lesions are identified.  IMPRESSION:  1.  No evidence of new metastatic disease to the abdomen or pelvis. 2.  Multiple previously noted bony metastases redemonstrated, as above.  These appear unchanged. 3.  Atherosclerosis.   Original Report Authenticated By: Trudie Reed, M.D.   ASSESSMENT AND PLAN: This is a very pleasant 72 years old white female with metastatic non-small cell lung cancer currently on maintenance chemotherapy with single agent Alimta every 4 weeks  status post 21 cycles. She is tolerating her treatment fairly well and CT scan of the chest, abdomen and pelvis showed no evidence for disease progression. I discussed the scan results and showed the images to the patient today. I recommended for her to continue on the same maintenance treatment with single agent Alimta every 4 weeks.  For the bone  disease the patient will continue on treatment with Zometa 4 mg IV every 2 months. She was advised to call immediately if she has any concerning symptoms in the interval. All questions were answered. The patient knows to call the clinic with any problems, questions or concerns. We can certainly see the patient much sooner if necessary.  I spent 15 minutes counseling the patient face to face. The total time spent in the appointment was 25 minutes.

## 2013-04-08 NOTE — Patient Instructions (Addendum)
[facility] Discharge Instructions for Patients Receiving Chemotherapy  Today you received the following chemotherapy agents Alimta  To help prevent nausea and vomiting after your treatment, we encourage you to take your nausea medication     If you develop nausea and vomiting that is not controlled by your nausea medication, call the clinic.   BELOW ARE SYMPTOMS THAT SHOULD BE REPORTED IMMEDIATELY:  *FEVER GREATER THAN 100.5 F  *CHILLS WITH OR WITHOUT FEVER  NAUSEA AND VOMITING THAT IS NOT CONTROLLED WITH YOUR NAUSEA MEDICATION  *UNUSUAL SHORTNESS OF BREATH  *UNUSUAL BRUISING OR BLEEDING  TENDERNESS IN MOUTH AND THROAT WITH OR WITHOUT PRESENCE OF ULCERS  *URINARY PROBLEMS  *BOWEL PROBLEMS  UNUSUAL RASH Items with * indicate a potential emergency and should be followed up as soon as possible.  Feel free to call the clinic you have any questions or concerns. The clinic phone number is [phone].    

## 2013-04-08 NOTE — Patient Instructions (Signed)
No evidence for disease progression on his recent scan.  Followup visit in one month with the next cycle of chemotherapy.

## 2013-04-09 ENCOUNTER — Telehealth: Payer: Self-pay | Admitting: Internal Medicine

## 2013-04-09 NOTE — Telephone Encounter (Signed)
Per staff message response from MM next tx should be 8/5. See 7/8 phone note. Pt already has correct appts.

## 2013-04-29 ENCOUNTER — Ambulatory Visit: Payer: Medicare Other

## 2013-05-05 ENCOUNTER — Other Ambulatory Visit: Payer: Medicare Other | Admitting: Lab

## 2013-05-06 ENCOUNTER — Other Ambulatory Visit (HOSPITAL_BASED_OUTPATIENT_CLINIC_OR_DEPARTMENT_OTHER): Payer: Medicare Other | Admitting: Lab

## 2013-05-06 ENCOUNTER — Ambulatory Visit (HOSPITAL_BASED_OUTPATIENT_CLINIC_OR_DEPARTMENT_OTHER): Payer: Medicare Other

## 2013-05-06 ENCOUNTER — Ambulatory Visit (HOSPITAL_BASED_OUTPATIENT_CLINIC_OR_DEPARTMENT_OTHER): Payer: Medicare Other | Admitting: Physician Assistant

## 2013-05-06 ENCOUNTER — Encounter: Payer: Self-pay | Admitting: Physician Assistant

## 2013-05-06 ENCOUNTER — Telehealth: Payer: Self-pay | Admitting: Internal Medicine

## 2013-05-06 VITALS — BP 119/76 | HR 58 | Temp 98.2°F | Resp 20 | Ht 65.0 in | Wt 142.4 lb

## 2013-05-06 DIAGNOSIS — C3490 Malignant neoplasm of unspecified part of unspecified bronchus or lung: Secondary | ICD-10-CM

## 2013-05-06 DIAGNOSIS — C3410 Malignant neoplasm of upper lobe, unspecified bronchus or lung: Secondary | ICD-10-CM

## 2013-05-06 DIAGNOSIS — C7951 Secondary malignant neoplasm of bone: Secondary | ICD-10-CM

## 2013-05-06 DIAGNOSIS — Z5111 Encounter for antineoplastic chemotherapy: Secondary | ICD-10-CM

## 2013-05-06 DIAGNOSIS — C7952 Secondary malignant neoplasm of bone marrow: Secondary | ICD-10-CM

## 2013-05-06 LAB — CBC WITH DIFFERENTIAL/PLATELET
Eosinophils Absolute: 0 10*3/uL (ref 0.0–0.5)
HCT: 37.4 % (ref 34.8–46.6)
LYMPH%: 35.9 % (ref 14.0–49.7)
MONO#: 0.4 10*3/uL (ref 0.1–0.9)
NEUT#: 2.4 10*3/uL (ref 1.5–6.5)
NEUT%: 53.9 % (ref 38.4–76.8)
Platelets: 255 10*3/uL (ref 145–400)
WBC: 4.5 10*3/uL (ref 3.9–10.3)

## 2013-05-06 LAB — COMPREHENSIVE METABOLIC PANEL (CC13)
BUN: 12.1 mg/dL (ref 7.0–26.0)
CO2: 26 mEq/L (ref 22–29)
Creatinine: 0.9 mg/dL (ref 0.6–1.1)
Glucose: 91 mg/dl (ref 70–140)
Total Bilirubin: 0.53 mg/dL (ref 0.20–1.20)

## 2013-05-06 MED ORDER — CYANOCOBALAMIN 1000 MCG/ML IJ SOLN
1000.0000 ug | Freq: Once | INTRAMUSCULAR | Status: AC
  Administered 2013-05-06: 1000 ug via INTRAMUSCULAR

## 2013-05-06 MED ORDER — ONDANSETRON 8 MG/50ML IVPB (CHCC)
8.0000 mg | Freq: Once | INTRAVENOUS | Status: AC
  Administered 2013-05-06: 8 mg via INTRAVENOUS

## 2013-05-06 MED ORDER — DEXAMETHASONE SODIUM PHOSPHATE 10 MG/ML IJ SOLN
10.0000 mg | Freq: Once | INTRAMUSCULAR | Status: AC
  Administered 2013-05-06: 10 mg via INTRAVENOUS

## 2013-05-06 MED ORDER — HYDROCODONE-ACETAMINOPHEN 5-325 MG PO TABS: 1.0000 | ORAL_TABLET | Freq: Four times a day (QID) | ORAL | Status: DC | PRN

## 2013-05-06 MED ORDER — SODIUM CHLORIDE 0.9 % IV SOLN
Freq: Once | INTRAVENOUS | Status: AC
  Administered 2013-05-06: 12:00:00 via INTRAVENOUS

## 2013-05-06 MED ORDER — SODIUM CHLORIDE 0.9 % IV SOLN
500.0000 mg/m2 | Freq: Once | INTRAVENOUS | Status: AC
  Administered 2013-05-06: 875 mg via INTRAVENOUS
  Filled 2013-05-06: qty 35

## 2013-05-06 NOTE — Telephone Encounter (Signed)
gv pt appt schedule for September.  °

## 2013-05-06 NOTE — Patient Instructions (Addendum)
Pemetrexed injection  What is this medicine?  PEMETREXED (PEM e TREX ed) is a chemotherapy drug. This medicine affects cells that are rapidly growing, such as cancer cells and cells in your mouth and stomach. It is usually used to treat lung cancers like non-small cell lung cancer and mesothelioma. It may also be used to treat other cancers.  This medicine may be used for other purposes; ask your health care provider or pharmacist if you have questions.  What should I tell my health care provider before I take this medicine?  They need to know if you have any of these conditions:  -if you frequently drink alcohol containing beverages  -infection (especially a virus infection such as chickenpox, cold sores, or herpes)  -kidney disease  -liver disease  -low blood counts, like low platelets, red bloods, or white blood cells  -an unusual or allergic reaction to pemetrexed, mannitol, other medicines, foods, dyes, or preservatives  -pregnant or trying to get pregnant  -breast-feeding  How should I use this medicine?  This drug is given as an infusion into a vein. It is administered in a hospital or clinic by a specially trained health care professional.  Talk to your pediatrician regarding the use of this medicine in children. Special care may be needed.  Overdosage: If you think you have taken too much of this medicine contact a poison control center or emergency room at once.  NOTE: This medicine is only for you. Do not share this medicine with others.  What if I miss a dose?  It is important not to miss your dose. Call your doctor or health care professional if you are unable to keep an appointment.  What may interact with this medicine?  -aspirin and aspirin-like medicines  -medicines to increase blood counts like filgrastim, pegfilgrastim, sargramostim  -methotrexate  -NSAIDS, medicines for pain and inflammation, like ibuprofen or naproxen  -probenecid  -pyrimethamine  -vaccines  Talk to your doctor or health care  professional before taking any of these medicines:  -acetaminophen  -aspirin  -ibuprofen  -ketoprofen  -naproxen  This list may not describe all possible interactions. Give your health care provider a list of all the medicines, herbs, non-prescription drugs, or dietary supplements you use. Also tell them if you smoke, drink alcohol, or use illegal drugs. Some items may interact with your medicine.  What should I watch for while using this medicine?  Visit your doctor for checks on your progress. This drug may make you feel generally unwell. This is not uncommon, as chemotherapy can affect healthy cells as well as cancer cells. Report any side effects. Continue your course of treatment even though you feel ill unless your doctor tells you to stop.  In some cases, you may be given additional medicines to help with side effects. Follow all directions for their use.  Call your doctor or health care professional for advice if you get a fever, chills or sore throat, or other symptoms of a cold or flu. Do not treat yourself. This drug decreases your body's ability to fight infections. Try to avoid being around people who are sick.  This medicine may increase your risk to bruise or bleed. Call your doctor or health care professional if you notice any unusual bleeding.  Be careful brushing and flossing your teeth or using a toothpick because you may get an infection or bleed more easily. If you have any dental work done, tell your dentist you are receiving this medicine.  Avoid   taking products that contain aspirin, acetaminophen, ibuprofen, naproxen, or ketoprofen unless instructed by your doctor. These medicines may hide a fever.  Call your doctor or health care professional if you get diarrhea or mouth sores. Do not treat yourself.  To protect your kidneys, drink water or other fluids as directed while you are taking this medicine.  Men and women must use effective birth control while taking this medicine. You may also  need to continue using effective birth control for a time after stopping this medicine. Do not become pregnant while taking this medicine. Tell your doctor right away if you think that you or your partner might be pregnant. There is a potential for serious side effects to an unborn child. Talk to your health care professional or pharmacist for more information. Do not breast-feed an infant while taking this medicine. This medicine may lower sperm counts.  What side effects may I notice from receiving this medicine?  Side effects that you should report to your doctor or health care professional as soon as possible:  -allergic reactions like skin rash, itching or hives, swelling of the face, lips, or tongue  -low blood counts - this medicine may decrease the number of white blood cells, red blood cells and platelets. You may be at increased risk for infections and bleeding.  -signs of infection - fever or chills, cough, sore throat, pain or difficulty passing urine  -signs of decreased platelets or bleeding - bruising, pinpoint red spots on the skin, black, tarry stools, blood in the urine  -signs of decreased red blood cells - unusually weak or tired, fainting spells, lightheadedness  -breathing problems, like a dry cough  -changes in emotions or moods  -chest pain  -confusion  -diarrhea  -high blood pressure  -mouth or throat sores or ulcers  -pain, swelling, warmth in the leg  -pain on swallowing  -swelling of the ankles, feet, hands  -trouble passing urine or change in the amount of urine  -vomiting  -yellowing of the eyes or skin  Side effects that usually do not require medical attention (report to your doctor or health care professional if they continue or are bothersome):  -hair loss  -loss of appetite  -nausea  -stomach upset  This list may not describe all possible side effects. Call your doctor for medical advice about side effects. You may report side effects to FDA at 1-800-FDA-1088.  Where should I keep  my medicine?  This drug is given in a hospital or clinic and will not be stored at home.  NOTE: This sheet is a summary. It may not cover all possible information. If you have questions about this medicine, talk to your doctor, pharmacist, or health care provider.   2013, [employer]. (04/21/2008 1:24:03 PM)

## 2013-05-06 NOTE — Patient Instructions (Addendum)
Follow up in 1 month prior to your next scheduled cycle of maintenance chemotherapy

## 2013-05-06 NOTE — Progress Notes (Addendum)
[facility] [phone]   [phone]  SHARED VISIT PROGRESS NOTE  [name], MD 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   PRIOR THERAPY:  1. Status post left upper lobectomy with mediastinal lymph node dissection under the care of Dr. Dorris Fetch on December 10, 2007.  2. Status post 4 cycles of adjuvant chemotherapy with cisplatin and Taxotere. Last dose was given 03/22/2008.  3. Status post 6 cycles of systemic chemotherapy with carboplatin and Alimta for metastatic disease in the bone. The last dose was given July 02, 2010, with stable disease.  4. Status post 12 cycles of maintenance Alimta at 500 mg/sq m given every 3 weeks. The last dose was given 05/08/2011.  CURRENT THERAPY:  1. Maintenance Alimta at 500 mg/sq m given every 4 weeks. The patient is status post 22 cycles.  2. Zometa 4 mg IV given every 2 months for bone metastasis.   DISEASE STAGE: Stage IV  CHEMOTHERAPY INTENT: Palliative  CURRENT # OF CHEMOTHERAPY CYCLES: 22  CURRENT ANTIEMETICS: none   CURRENT SMOKING STATUS: Never smoker  ORAL CHEMOTHERAPY AND CONSENT: n/a  CURRENT BISPHOSPHONATES USE:  Yes, Zometa given every 2 months  PAIN MANAGEMENT: Norco  NARCOTICS INDUCED CONSTIPATION: Occasional, uses MiraLax  LIVING WILL AND CODE STATUS:  Has a living will   INTERVAL HISTORY: Emmalea Treanor 72 y.o. female returns to the clinic today for followup visit. The patient is feeling fine today with no specific complaints except for her feet feeling cold and some occasional toe numbness. She has not had any stumbling, near falls or falls. She denied having any significant fatigue or weakness. She actually feels as if she has increased energy. She has no  chest pain, shortness breath, cough or hemoptysis. The patient denied having any significant nausea or vomiting, no fever or chills. She is tolerating her treatment with maintenance Alimta fairly well with no significant adverse effects.   MEDICAL HISTORY: Past Medical History  Diagnosis Date  . Hypertension   . HYPERLIPIDEMIA 11/05/2007  . ANXIETY 05/13/2009  . DEPRESSIVE DISORDER 05/06/2008  . HYPERTENSION 11/05/2007  . ALLERGIC RHINITIS 11/05/2007  . CONSTIPATION, CHRONIC 05/13/2009  . UTI 05/06/2008  . BACK PAIN 05/11/2008  . OSTEOPOROSIS 11/05/2007  . INSOMNIA-SLEEP DISORDER-UNSPEC 05/13/2009  . FATIGUE 05/13/2009  . Swelling, mass, or lump in chest 11/05/2007  . LUNG CANCER, HX OF 05/06/2008  . lung ca w/ bone mets dx'd 11/2007    lt hip and spine    ALLERGIES:  is allergic to amoxicillin.  MEDICATIONS:  Current Outpatient Prescriptions  Medication Sig Dispense Refill  . aspirin 81 MG EC tablet Take 81 mg by mouth daily.        . Calcium Carbonate-Vitamin D (CALCIUM-VITAMIN D) 500-200 MG-UNIT per tablet Take 1 tablet by mouth 2 (two) times daily.        . Flaxseed, Linseed, (FLAXSEED OIL) 1000 MG CAPS Take 1,000 mg by mouth daily.        . folic acid (FOLVITE) 1 MG tablet TAKE 1 TABLET BY MOUTH EVERY DAY  90 tablet  1  . HYDROcodone-acetaminophen (NORCO) 5-325 MG per tablet Take 1 tablet by mouth every 6 (six) hours as needed for pain.  30 tablet  0  . LORazepam (ATIVAN)  0.5 MG tablet TAKE 1 TABLET BY MOUTH EVERY 8 HOURS AS NEEDED FOR ANXIETY  40 tablet  1  . losartan-hydrochlorothiazide (HYZAAR) 50-12.5 MG per tablet Take 1 tablet by mouth daily.  90 tablet  3  . Multiple Vitamin (ANTIOXIDANT A/C/E PO) Take by mouth daily.        . Omega-3 350 MG CAPS Take by mouth daily.        . polyethylene glycol powder (MIRALAX) powder Take 17 g by mouth daily.        . Probiotic Product (PROBIOTIC FORMULA) CAPS Take 1 capsule by mouth daily.        . simvastatin (ZOCOR) 40 MG tablet Take 1 tablet (40 mg  total) by mouth daily.  90 tablet  3  . vitamin B-12 (CYANOCOBALAMIN) 100 MCG tablet Take 50 mcg by mouth daily.        . vitamin E ([employer] VITAMIN E) 400 UNIT capsule Take 400 Units by mouth daily.        [name] zolendronic acid (ZOMETA) 4 MG/5ML injection Inject 4 mg into the vein once.        [name] zolpidem (AMBIEN CR) 12.5 MG CR tablet Take 1 tablet (12.5 mg total) by mouth at bedtime as needed for sleep.  90 tablet  1   No current facility-administered medications for this visit.    SURGICAL HISTORY:  Past Surgical History  Procedure Laterality Date  . Tubal ligation    . S/p lul lung surgury  12/2007  . Lobectomy      REVIEW OF SYSTEMS:  Pertinent items are noted in HPI.   PHYSICAL EXAMINATION: General appearance: alert, cooperative and no distress Head: Normocephalic, without obvious abnormality, atraumatic Neck: no adenopathy Lymph nodes: Cervical, supraclavicular, and axillary nodes normal. Resp: clear to auscultation bilaterally Cardio: regular rate and rhythm, S1, S2 normal, no murmur, click, rub or gallop GI: soft, non-tender; bowel sounds normal; no masses,  no organomegaly Extremities: extremities normal, atraumatic, no cyanosis or edema Neurologic: Alert and oriented X 3, normal strength and tone. Normal symmetric reflexes. Normal coordination and gait  ECOG PERFORMANCE STATUS: 1 - Symptomatic but completely ambulatory  Blood pressure 119/76, pulse 58, temperature 98.2 F (36.8 C), temperature source Oral, resp. rate 20, height 5\' 5"  (1.651 m), weight 142 lb 6.4 oz (64.592 kg).  LABORATORY DATA: Lab Results  Component Value Date   WBC 4.5 05/06/2013   HGB 12.7 05/06/2013   HCT 37.4 05/06/2013   MCV 94.9 05/06/2013   PLT 255 05/06/2013      Chemistry      Component Value Date/Time   NA 142 05/06/2013 1018   NA 135 10/08/2012 1459   NA 140 04/17/2012 0855   K 3.9 05/06/2013 1018   K 3.7 10/08/2012 1459   K 4.1 04/17/2012 0855   CL 100 03/04/2013 0901   CL 96 10/08/2012 1459   CL 99  04/17/2012 0855   CO2 26 05/06/2013 1018   CO2 29 10/08/2012 1459   CO2 29 04/17/2012 0855   BUN 12.1 05/06/2013 1018   BUN 14 10/08/2012 1459   BUN 15 04/17/2012 0855   CREATININE 0.9 05/06/2013 1018   CREATININE 0.96 10/08/2012 1459   CREATININE 1.4* 04/17/2012 0855      Component Value Date/Time   CALCIUM 10.5* 05/06/2013 1018   CALCIUM 9.9 10/08/2012 1459   CALCIUM 9.5 04/17/2012 0855   ALKPHOS 68 05/06/2013 1018   ALKPHOS 74 10/08/2012 1459   ALKPHOS 68 04/17/2012 0855  AST 27 05/06/2013 1018   AST 22 10/08/2012 1459   AST 29 04/17/2012 0855   ALT 18 05/06/2013 1018   ALT 17 10/08/2012 1459   ALT 31 04/17/2012 0855   BILITOT 0.53 05/06/2013 1018   BILITOT 0.2* 10/08/2012 1459   BILITOT 0.60 04/17/2012 0855       RADIOGRAPHIC STUDIES: Ct Chest W Contrast  04/01/2013   *RADIOLOGY REPORT*  Clinical Data:  History of lung cancer with metastatic disease. Chemotherapy in progress.  CT CHEST, ABDOMEN AND PELVIS WITH CONTRAST  Technique:  Multidetector CT imaging of the chest, abdomen and pelvis was performed following the standard protocol during bolus administration of intravenous contrast.  Contrast: OMNIPAQUE IOHEXOL 300 MG/ML  SOLN  Comparison:  Multiple priors, most recently 01/02/2013.   CT CHEST  Findings:  Mediastinum: Heart size is normal. There is no significant pericardial fluid, thickening or pericardial calcification. There is atherosclerosis of the thoracic aorta, the great vessels of the mediastinum and the coronary arteries, including calcified atherosclerotic plaque in the left main, left anterior descending, left circumflex and right coronary arteries. No pathologically enlarged mediastinal or hilar lymph nodes. Small hiatal hernia.  Lungs/Pleura: Postoperative changes of left upper lobectomy are redemonstrated.  Compensatory hyperexpansion of the left lower lobe is noted.  Several areas of mild pleural-based scarring in the left lower lobe are similar to the prior study.  No suspicious appearing  pulmonary nodules or masses are noted.  No acute consolidative air space disease.  No pleural effusions.  Musculoskeletal: Again noted are multiple sclerotic lesions scattered throughout the spine.  These appear similar in size, number and distribution to the prior examination, and are compatible with metastatic disease.  The largest of these lesions is within the T6 vertebral body.  No definite new bony lesions are noted.  IMPRESSION:  1.  Status post left upper lobectomy without evidence to suggest recurrent disease in the lungs or new metastatic disease to the thorax. 2.  Previously noted sclerotic bony lesions are unchanged, compatible with treated metastatic disease to the bones. 3. Atherosclerosis, including left main and three-vessel coronary artery disease.   Assessment for potential risk factor modification, dietary therapy or pharmacologic therapy may be warranted, if clinically indicated. 4.  Small hiatal hernia.    CT ABDOMEN AND PELVIS  Findings:  Abdomen/Pelvis: The appearance of the liver, gallbladder, pancreas, spleen, bilateral adrenal glands and bilateral kidneys is unremarkable.  Atherosclerosis throughout the abdominal and pelvic vasculature, without definite aneurysm or dissection.  No significant volume of ascites.  No pneumoperitoneum.  No pathologic distension of small bowel.  Normal appendix.  No definite pathologic lymphadenopathy identified within the abdomen and pelvis on today's examination.  Uterus and bilateral ovaries are unremarkable in appearance.  Urinary bladder is normal in appearance.  Musculoskeletal: Multiple sclerotic lesions are again noted throughout the visualized axial skeleton.  The largest of these lesions are in the left iliac bone immediately lateral to the left sacroiliac joint, and in the right side of the L5 vertebral body. These lesions appears similar to prior studies, and are compatible with treated bony metastases.  No definite new lesions are identified.   IMPRESSION:  1.  No evidence of new metastatic disease to the abdomen or pelvis. 2.  Multiple previously noted bony metastases redemonstrated, as above.  These appear unchanged. 3.  Atherosclerosis.   Original Report Authenticated By: Trudie Reed, M.D.   ASSESSMENT AND PLAN: This is a very pleasant 72 years old white female with metastatic  non-small cell lung cancer currently on maintenance chemotherapy with single agent Alimta every 4 weeks status post 22 cycles. She is tolerating her treatment fairly well and CT scan of the chest, abdomen and pelvis showed no evidence for disease progression. The patient was discussed with and seen by Dr. Arbutus Ped. She will continue on her maintenance chemotherapy with single agent Alimta given every 4 weeks. She will follow up in 4 weeks with a repeat CBC Differntial and CMET. She will also be due for Zometa infusion when she returns in 4 weeks.  Brookes Craine E, PA-C   For the bone disease the patient will continue on treatment with Zometa 4 mg IV every 2 months. She was advised to call immediately if she has any concerning symptoms in the interval. All questions were answered. The patient knows to call the clinic with any problems, questions or concerns. We can certainly see the patient much sooner if necessary.  ADDENDUM: Hematology/Oncology Attending: I have a face to face encounter with the patient today. I recommended her care plan. She is a very pleasant 72 years old white female with metastatic non-small cell lung cancer, adenocarcinoma currently undergoing maintenance chemotherapy with single agent Alimta every 4 weeks. The patient is tolerating her treatment fairly well with no significant adverse effects. We will proceed with the next cycle of her treatment today as scheduled. She would come back for follow up visit in 4 weeks with the next treatment. She is also scheduled for Zometa infusion at that time. Lajuana Matte., MD 05/06/2013

## 2013-05-27 ENCOUNTER — Encounter: Payer: Self-pay | Admitting: Internal Medicine

## 2013-06-03 ENCOUNTER — Ambulatory Visit (HOSPITAL_BASED_OUTPATIENT_CLINIC_OR_DEPARTMENT_OTHER): Payer: Medicare Other | Admitting: Internal Medicine

## 2013-06-03 ENCOUNTER — Ambulatory Visit (HOSPITAL_BASED_OUTPATIENT_CLINIC_OR_DEPARTMENT_OTHER): Payer: Medicare Other

## 2013-06-03 ENCOUNTER — Encounter: Payer: Self-pay | Admitting: Internal Medicine

## 2013-06-03 ENCOUNTER — Other Ambulatory Visit (HOSPITAL_BASED_OUTPATIENT_CLINIC_OR_DEPARTMENT_OTHER): Payer: Medicare Other | Admitting: Lab

## 2013-06-03 VITALS — BP 116/69 | HR 56 | Temp 98.4°F | Resp 18 | Ht 65.0 in | Wt 141.0 lb

## 2013-06-03 DIAGNOSIS — C7951 Secondary malignant neoplasm of bone: Secondary | ICD-10-CM

## 2013-06-03 DIAGNOSIS — C3410 Malignant neoplasm of upper lobe, unspecified bronchus or lung: Secondary | ICD-10-CM

## 2013-06-03 DIAGNOSIS — Z5111 Encounter for antineoplastic chemotherapy: Secondary | ICD-10-CM

## 2013-06-03 DIAGNOSIS — C3490 Malignant neoplasm of unspecified part of unspecified bronchus or lung: Secondary | ICD-10-CM

## 2013-06-03 LAB — CBC WITH DIFFERENTIAL/PLATELET
BASO%: 1.4 % (ref 0.0–2.0)
MCHC: 33.8 g/dL (ref 31.5–36.0)
MONO#: 0.4 10*3/uL (ref 0.1–0.9)
RBC: 4.05 10*6/uL (ref 3.70–5.45)
WBC: 4.3 10*3/uL (ref 3.9–10.3)
lymph#: 1.6 10*3/uL (ref 0.9–3.3)
nRBC: 0 % (ref 0–0)

## 2013-06-03 LAB — COMPREHENSIVE METABOLIC PANEL (CC13)
ALT: 20 U/L (ref 0–55)
AST: 27 U/L (ref 5–34)
Albumin: 3.9 g/dL (ref 3.5–5.0)
Calcium: 9.9 mg/dL (ref 8.4–10.4)
Chloride: 106 mEq/L (ref 98–109)
Potassium: 4.1 mEq/L (ref 3.5–5.1)
Sodium: 141 mEq/L (ref 136–145)

## 2013-06-03 MED ORDER — SODIUM CHLORIDE 0.9 % IV SOLN
Freq: Once | INTRAVENOUS | Status: AC
  Administered 2013-06-03: 14:00:00 via INTRAVENOUS

## 2013-06-03 MED ORDER — ZOLEDRONIC ACID 4 MG/100ML IV SOLN
4.0000 mg | Freq: Once | INTRAVENOUS | Status: AC
  Administered 2013-06-03: 4 mg via INTRAVENOUS
  Filled 2013-06-03: qty 100

## 2013-06-03 MED ORDER — DEXAMETHASONE SODIUM PHOSPHATE 10 MG/ML IJ SOLN
10.0000 mg | Freq: Once | INTRAMUSCULAR | Status: AC
  Administered 2013-06-03: 10 mg via INTRAVENOUS

## 2013-06-03 MED ORDER — SODIUM CHLORIDE 0.9 % IJ SOLN
10.0000 mL | INTRAMUSCULAR | Status: DC | PRN
  Filled 2013-06-03: qty 10

## 2013-06-03 MED ORDER — ONDANSETRON 8 MG/50ML IVPB (CHCC)
8.0000 mg | Freq: Once | INTRAVENOUS | Status: AC
  Administered 2013-06-03: 8 mg via INTRAVENOUS

## 2013-06-03 MED ORDER — SODIUM CHLORIDE 0.9 % IV SOLN
500.0000 mg/m2 | Freq: Once | INTRAVENOUS | Status: AC
  Administered 2013-06-03: 875 mg via INTRAVENOUS
  Filled 2013-06-03: qty 35

## 2013-06-03 MED ORDER — HEPARIN SOD (PORK) LOCK FLUSH 100 UNIT/ML IV SOLN
500.0000 [IU] | Freq: Once | INTRAVENOUS | Status: DC | PRN
  Filled 2013-06-03: qty 5

## 2013-06-03 NOTE — Progress Notes (Signed)
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   PRIOR THERAPY:  1. Status post left upper lobectomy with mediastinal lymph node dissection under the care of Dr. Dorris Fetch on December 10, 2007.  2. Status post 4 cycles of adjuvant chemotherapy with cisplatin and Taxotere. Last dose was given 03/22/2008.  3. Status post 6 cycles of systemic chemotherapy with carboplatin and Alimta for metastatic disease in the bone. The last dose was given July 02, 2010, with stable disease.  4. Status post 12 cycles of maintenance Alimta at 500 mg/sq m given every 3 weeks. The last dose was given 05/08/2011.  CURRENT THERAPY:  1. Maintenance Alimta at 500 mg/sq m given every 4 weeks. The patient is status post 23 cycles.  2. Zometa 4 mg IV given every 2 months for bone metastasis.   CHEMOTHERAPY INTENT: Palliative  CURRENT # OF CHEMOTHERAPY CYCLES: 23  CURRENT ANTIEMETICS: none  CURRENT SMOKING STATUS: Never smoker   ORAL CHEMOTHERAPY AND CONSENT: n/a  CURRENT BISPHOSPHONATES USE: Yes, Zometa given every 2 months  PAIN MANAGEMENT: Norco  NARCOTICS INDUCED CONSTIPATION: Occasional, uses MiraLax  LIVING WILL AND CODE STATUS: Has a living will   INTERVAL HISTORY: Tamara Ortiz 72 y.o. female returns to the clinic today for followup visit. The patient is feeling fine today with no specific complaints. She tolerated the last cycle of her systemic chemotherapy fairly well with no significant adverse effects. She denied having any significant chest pain, shortness breath, cough or hemoptysis. She has no nausea or vomiting. She has no fever or chills.  MEDICAL HISTORY: Past  Medical History  Diagnosis Date  . Hypertension   . HYPERLIPIDEMIA 11/05/2007  . ANXIETY 05/13/2009  . DEPRESSIVE DISORDER 05/06/2008  . HYPERTENSION 11/05/2007  . ALLERGIC RHINITIS 11/05/2007  . CONSTIPATION, CHRONIC 05/13/2009  . UTI 05/06/2008  . BACK PAIN 05/11/2008  . OSTEOPOROSIS 11/05/2007  . INSOMNIA-SLEEP DISORDER-UNSPEC 05/13/2009  . FATIGUE 05/13/2009  . Swelling, mass, or lump in chest 11/05/2007  . LUNG CANCER, HX OF 05/06/2008  . lung ca w/ bone mets dx'd 11/2007    lt hip and spine    ALLERGIES:  is allergic to amoxicillin.  MEDICATIONS:  Current Outpatient Prescriptions  Medication Sig Dispense Refill  . aspirin 81 MG EC tablet Take 81 mg by mouth daily.        . Calcium Carbonate-Vitamin D (CALCIUM-VITAMIN D) 500-200 MG-UNIT per tablet Take 1 tablet by mouth 2 (two) times daily.        . Flaxseed, Linseed, (FLAXSEED OIL) 1000 MG CAPS Take 1,000 mg by mouth daily.        . folic acid (FOLVITE) 1 MG tablet TAKE 1 TABLET BY MOUTH EVERY DAY  90 tablet  1  . LORazepam (ATIVAN) 0.5 MG tablet TAKE 1 TABLET BY MOUTH EVERY 8 HOURS AS NEEDED FOR ANXIETY  40 tablet  1  . losartan-hydrochlorothiazide (HYZAAR) 50-12.5 MG per tablet Take 1 tablet by mouth daily.  90 tablet  3  . Multiple Vitamin (ANTIOXIDANT A/C/E PO) Take by mouth daily.        . Omega-3 350 MG CAPS Take by mouth daily.        [name]  Probiotic Product (PROBIOTIC FORMULA) CAPS Take 1 capsule by mouth daily.        . simvastatin (ZOCOR) 40 MG tablet Take 1 tablet (40 mg total) by mouth daily.  90 tablet  3  . vitamin B-12 (CYANOCOBALAMIN) 100 MCG tablet Take 50 mcg by mouth daily.        . vitamin E ([employer] VITAMIN E) 400 UNIT capsule Take 400 Units by mouth daily.        [name] zolendronic acid (ZOMETA) 4 MG/5ML injection Inject 4 mg into the vein once.        [name] zolpidem (AMBIEN CR) 12.5 MG CR tablet Take 1 tablet (12.5 mg total) by mouth at bedtime as needed for sleep.  90 tablet  1  . HYDROcodone-acetaminophen (NORCO) 5-325 MG per tablet  Take 1 tablet by mouth every 6 (six) hours as needed for pain.  30 tablet  0  . polyethylene glycol powder (MIRALAX) powder Take 17 g by mouth daily.         No current facility-administered medications for this visit.    SURGICAL HISTORY:  Past Surgical History  Procedure Laterality Date  . Tubal ligation    . S/p lul lung surgury  12/2007  . Lobectomy      REVIEW OF SYSTEMS:  A comprehensive review of systems was negative.   PHYSICAL EXAMINATION: General appearance: alert, cooperative and no distress Head: Normocephalic, without obvious abnormality, atraumatic Neck: no adenopathy Lymph nodes: Cervical, supraclavicular, and axillary nodes normal. Resp: clear to auscultation bilaterally Cardio: regular rate and rhythm, S1, S2 normal, no murmur, click, rub or gallop GI: soft, non-tender; bowel sounds normal; no masses,  no organomegaly Extremities: extremities normal, atraumatic, no cyanosis or edema  ECOG PERFORMANCE STATUS: 0 - Asymptomatic  Blood pressure 116/69, pulse 56, temperature 98.4 F (36.9 C), temperature source Oral, resp. rate 18, height 5\' 5"  (1.651 m), weight 141 lb (63.957 kg), SpO2 100.00%.  LABORATORY DATA: Lab Results  Component Value Date   WBC 4.3 06/03/2013   HGB 13.1 06/03/2013   HCT 38.8 06/03/2013   MCV 95.8 06/03/2013   PLT 246 06/03/2013      Chemistry      Component Value Date/Time   NA 142 05/06/2013 1018   NA 135 10/08/2012 1459   NA 140 04/17/2012 0855   K 3.9 05/06/2013 1018   K 3.7 10/08/2012 1459   K 4.1 04/17/2012 0855   CL 100 03/04/2013 0901   CL 96 10/08/2012 1459   CL 99 04/17/2012 0855   CO2 26 05/06/2013 1018   CO2 29 10/08/2012 1459   CO2 29 04/17/2012 0855   BUN 12.1 05/06/2013 1018   BUN 14 10/08/2012 1459   BUN 15 04/17/2012 0855   CREATININE 0.9 05/06/2013 1018   CREATININE 0.96 10/08/2012 1459   CREATININE 1.4* 04/17/2012 0855      Component Value Date/Time   CALCIUM 10.5* 05/06/2013 1018   CALCIUM 9.9 10/08/2012 1459   CALCIUM 9.5 04/17/2012 0855    ALKPHOS 68 05/06/2013 1018   ALKPHOS 74 10/08/2012 1459   ALKPHOS 68 04/17/2012 0855   AST 27 05/06/2013 1018   AST 22 10/08/2012 1459   AST 29 04/17/2012 0855   ALT 18 05/06/2013 1018   ALT 17 10/08/2012 1459   ALT 31 04/17/2012 0855   BILITOT 0.53 05/06/2013 1018   BILITOT 0.2* 10/08/2012 1459   BILITOT 0.60 04/17/2012 0855       RADIOGRAPHIC STUDIES: No results found.  ASSESSMENT  AND PLAN: This is a very pleasant 72 years old white female with recurrent non-small cell lung cancer, adenocarcinoma. The patient is currently on maintenance Alimta monthly status post total of 23 cycles. She is tolerating her treatment fairly well with no significant adverse effects. I recommended for the patient to proceed with cycle #24 today as scheduled. She would come back for followup visit in one month's for evaluation before starting the next cycle of her chemotherapy. The patient was advised to call immediately if she has any concerning symptoms in the interval.  The patient voices understanding of current disease status and treatment options and is in agreement with the current care plan.  All questions were answered. The patient knows to call the clinic with any problems, questions or concerns. We can certainly see the patient much sooner if necessary.

## 2013-06-03 NOTE — Patient Instructions (Signed)
 Cancer Center Discharge Instructions for Patients Receiving Chemotherapy  Today you received the following chemotherapy agents alimta, zometa  To help prevent nausea and vomiting after your treatment, we encourage you to take your nausea medication as needed   If you develop nausea and vomiting that is not controlled by your nausea medication, call the clinic.   BELOW ARE SYMPTOMS THAT SHOULD BE REPORTED IMMEDIATELY:  *FEVER GREATER THAN 100.5 F  *CHILLS WITH OR WITHOUT FEVER  NAUSEA AND VOMITING THAT IS NOT CONTROLLED WITH YOUR NAUSEA MEDICATION  *UNUSUAL SHORTNESS OF BREATH  *UNUSUAL BRUISING OR BLEEDING  TENDERNESS IN MOUTH AND THROAT WITH OR WITHOUT PRESENCE OF ULCERS  *URINARY PROBLEMS  *BOWEL PROBLEMS  UNUSUAL RASH Items with * indicate a potential emergency and should be followed up as soon as possible.  Feel free to call the clinic you have any questions or concerns. The clinic phone number is [phone].

## 2013-06-04 ENCOUNTER — Telehealth: Payer: Self-pay | Admitting: Internal Medicine

## 2013-06-04 ENCOUNTER — Telehealth: Payer: Self-pay | Admitting: *Deleted

## 2013-06-04 NOTE — Telephone Encounter (Signed)
lvm for pt regarding to appt being moved from 9.30 to 9.29.Marland KitchenMarland KitchenMarland Kitchenper pt request

## 2013-06-04 NOTE — Telephone Encounter (Signed)
Per staff message and POF I have scheduled appts.  [name]  

## 2013-06-25 ENCOUNTER — Encounter: Payer: Self-pay | Admitting: Internal Medicine

## 2013-06-25 ENCOUNTER — Other Ambulatory Visit (INDEPENDENT_AMBULATORY_CARE_PROVIDER_SITE_OTHER): Payer: Medicare Other

## 2013-06-25 ENCOUNTER — Ambulatory Visit (INDEPENDENT_AMBULATORY_CARE_PROVIDER_SITE_OTHER): Payer: Medicare Other | Admitting: Internal Medicine

## 2013-06-25 VITALS — BP 110/62 | HR 60 | Temp 97.2°F | Ht 65.5 in | Wt 141.0 lb

## 2013-06-25 DIAGNOSIS — F329 Major depressive disorder, single episode, unspecified: Secondary | ICD-10-CM

## 2013-06-25 DIAGNOSIS — E785 Hyperlipidemia, unspecified: Secondary | ICD-10-CM

## 2013-06-25 DIAGNOSIS — Z23 Encounter for immunization: Secondary | ICD-10-CM

## 2013-06-25 DIAGNOSIS — I10 Essential (primary) hypertension: Secondary | ICD-10-CM

## 2013-06-25 LAB — URINALYSIS, ROUTINE W REFLEX MICROSCOPIC
Hgb urine dipstick: NEGATIVE
Ketones, ur: NEGATIVE
Specific Gravity, Urine: 1.015 (ref 1.000–1.030)
Total Protein, Urine: NEGATIVE
Urine Glucose: NEGATIVE
Urobilinogen, UA: 0.2 (ref 0.0–1.0)
pH: 7.5 (ref 5.0–8.0)

## 2013-06-25 LAB — LIPID PANEL
Cholesterol: 246 mg/dL — ABNORMAL HIGH (ref 0–200)
HDL: 91.7 mg/dL (ref >39.00)
Total CHOL/HDL Ratio: 3
Triglycerides: 68 mg/dL (ref 0.0–149.0)

## 2013-06-25 MED ORDER — ZOLPIDEM TARTRATE ER 12.5 MG PO TBCR: 12.5000 mg | EXTENDED_RELEASE_TABLET | Freq: Every evening | ORAL | Status: DC | PRN

## 2013-06-25 NOTE — Assessment & Plan Note (Signed)
stable overall by history and exam, recent data reviewed with pt, and pt to continue medical treatment as before,  to f/u any worsening symptoms or concerns BP Readings from Last 3 Encounters:  06/25/13 110/62  06/03/13 116/69  05/06/13 119/76

## 2013-06-25 NOTE — Progress Notes (Signed)
Subjective:    Patient ID: Tamara Ortiz, female    DOB: [birth year], 72 y.o.   MRN: [id number]  HPI  Here for yearly f/u;  Overall doing ok;  Pt denies CP, worsening SOB, DOE, wheezing, orthopnea, PND, worsening LE edema, palpitations, dizziness or syncope.  Pt denies neurological change such as new headache, facial or extremity weakness.  Pt denies polydipsia, polyuria, or low sugar symptoms. Pt states overall good compliance with treatment and medications, good tolerability, and has been trying to follow lower cholesterol diet.  Pt denies worsening depressive symptoms, suicidal ideation or panic. No fever, night sweats, wt loss, loss of appetite, or other constitutional symptoms.  Pt states good ability with ADL's, has low fall risk, home safety reviewed and adequate, no other significant changes in hearing or vision, and trying to stay active with exercise, did go to gym earlier today.  Has gained several lbs.  Getting chemo once monthly.  No other acute complaints Past Medical History  Diagnosis Date  . Hypertension   . HYPERLIPIDEMIA 11/05/2007  . ANXIETY 05/13/2009  . DEPRESSIVE DISORDER 05/06/2008  . HYPERTENSION 11/05/2007  . ALLERGIC RHINITIS 11/05/2007  . CONSTIPATION, CHRONIC 05/13/2009  . UTI 05/06/2008  . BACK PAIN 05/11/2008  . OSTEOPOROSIS 11/05/2007  . INSOMNIA-SLEEP DISORDER-UNSPEC 05/13/2009  . FATIGUE 05/13/2009  . Swelling, mass, or lump in chest 11/05/2007  . LUNG CANCER, HX OF 05/06/2008  . lung ca w/ bone mets dx'd 11/2007    lt hip and spine   Past Surgical History  Procedure Laterality Date  . Tubal ligation    . S/p lul lung surgury  12/2007  . Lobectomy      reports that she has never smoked. She has never used smokeless tobacco. She reports that  drinks alcohol. She reports that she does not use illicit drugs. family history includes ALS in her father; Alcohol abuse in her son; Heart disease in her mother. Allergies  Allergen Reactions  . Amoxicillin     REACTION: hives/itch    Current Outpatient Prescriptions on File Prior to Visit  Medication Sig Dispense Refill  . aspirin 81 MG EC tablet Take 81 mg by mouth daily.        . Calcium Carbonate-Vitamin D (CALCIUM-VITAMIN D) 500-200 MG-UNIT per tablet Take 1 tablet by mouth 2 (two) times daily.        . Flaxseed, Linseed, (FLAXSEED OIL) 1000 MG CAPS Take 1,000 mg by mouth daily.        . folic acid (FOLVITE) 1 MG tablet TAKE 1 TABLET BY MOUTH EVERY DAY  90 tablet  1  . HYDROcodone-acetaminophen (NORCO) 5-325 MG per tablet Take 1 tablet by mouth every 6 (six) hours as needed for pain.  30 tablet  0  . LORazepam (ATIVAN) 0.5 MG tablet TAKE 1 TABLET BY MOUTH EVERY 8 HOURS AS NEEDED FOR ANXIETY  40 tablet  1  . losartan-hydrochlorothiazide (HYZAAR) 50-12.5 MG per tablet Take 1 tablet by mouth daily.  90 tablet  3  . Multiple Vitamin (ANTIOXIDANT A/C/E PO) Take by mouth daily.        . Omega-3 350 MG CAPS Take by mouth daily.        . polyethylene glycol powder (MIRALAX) powder Take 17 g by mouth daily.        . Probiotic Product (PROBIOTIC FORMULA) CAPS Take 1 capsule by mouth daily.        . simvastatin (ZOCOR) 40 MG tablet Take 1 tablet (40 mg total) by  mouth daily.  90 tablet  3  . vitamin B-12 (CYANOCOBALAMIN) 100 MCG tablet Take 50 mcg by mouth daily.        . vitamin E ([employer]S VITAMIN E) 400 UNIT capsule Take 400 Units by mouth daily.        [name] zolendronic acid (ZOMETA) 4 MG/5ML injection Inject 4 mg into the vein once.         No current facility-administered medications on file prior to visit.   Review of Systems  Constitutional: Negative for unexpected weight change, or unusual diaphoresis  HENT: Negative for tinnitus.   Eyes: Negative for photophobia and visual disturbance.  Respiratory: Negative for choking and stridor.   Gastrointestinal: Negative for vomiting and blood in stool.  Genitourinary: Negative for hematuria and decreased urine volume.  Musculoskeletal: Negative for acute joint swelling Skin:  Negative for color change and wound.  Neurological: Negative for tremors and numbness other than noted  Psychiatric/Behavioral: Negative for decreased concentration or  hyperactivity.       Objective:   Physical Exam BP 110/62  Pulse 60  Temp(Src) 97.2 F (36.2 C) (Oral)  Ht 5' 5.5" (1.664 m)  Wt 141 lb (63.957 kg)  BMI 23.1 kg/m2  SpO2 97% VS noted, looks better, has gained a few lbs, better color Constitutional: Pt appears well-developed and well-nourished.  HENT: Head: NCAT.  Right Ear: External ear normal.  Left Ear: External ear normal.  Eyes: Conjunctivae and EOM are normal. Pupils are equal, round, and reactive to light.  Neck: Normal range of motion. Neck supple.  Cardiovascular: Normal rate and regular rhythm.   Pulmonary/Chest: Effort normal and breath sounds normal.  Abd:  Soft, NT, non-distended, + BS Neurological: Pt is alert. Not confused  Skin: Skin is warm. No erythema.  Psychiatric: Pt behavior is normal. Thought content normal.     Assessment & Plan:

## 2013-06-25 NOTE — Assessment & Plan Note (Signed)
stable overall by history and exam, recent data reviewed with pt, and pt to continue medical treatment as before,  to f/u any worsening symptoms or concerns Lab Results  Component Value Date   LDLCALC 108* 05/13/2009

## 2013-06-25 NOTE — Patient Instructions (Addendum)
You had the flu shot today Please consider returning in Dec 2014 for the Prevnar shot Please continue all other medications as before, and refills have been done if requested. Please have the pharmacy call with any other refills you may need. Please keep your appointments with your specialists as you have planned  Please go to the LAB in the Basement (turn left off the elevator) for the tests to be done today  You will be contacted by phone if any changes need to be made immediately.  Otherwise, you will receive a letter about your results with an explanation, but please check with MyChart first.  Please remember to sign up for My Chart if you have not done so, as this will be important to you in the future with finding out test results, communicating by private email, and scheduling acute appointments online when needed.  Please return in 6 months, or sooner if needed

## 2013-06-25 NOTE — Assessment & Plan Note (Signed)
stable overall by history and exam, recent data reviewed with pt, and pt to continue medical treatment as before,  to f/u any worsening symptoms or concerns Lab Results  Component Value Date   WBC 4.3 06/03/2013   HGB 13.1 06/03/2013   HCT 38.8 06/03/2013   PLT 246 06/03/2013   GLUCOSE 92 06/03/2013   CHOL 288* 07/17/2012   TRIG 214.0* 07/17/2012   HDL 70.70 07/17/2012   LDLDIRECT 210.0 07/17/2012   LDLCALC 108* 05/13/2009   ALT 20 06/03/2013   AST 27 06/03/2013   NA 141 06/03/2013   K 4.1 06/03/2013   CL 100 03/04/2013   CREATININE 0.9 06/03/2013   BUN 12.1 06/03/2013   CO2 25 06/03/2013   TSH 3.09 07/17/2012   INR 1.00 01/24/2010   HGBA1C  Value: 5.4 (NOTE)   The ADA recommends the following therapeutic goals for glycemic   control related to Hgb A1C measurement:   Goal of Therapy:   < 7.0% Hgb A1C   Action Suggested:  > 8.0% Hgb A1C   Ref:  Diabetes Care, 22, Suppl. 1, 1999 10/30/2007

## 2013-06-30 ENCOUNTER — Ambulatory Visit (HOSPITAL_BASED_OUTPATIENT_CLINIC_OR_DEPARTMENT_OTHER): Payer: Medicare Other | Admitting: Physician Assistant

## 2013-06-30 ENCOUNTER — Encounter: Payer: Self-pay | Admitting: Physician Assistant

## 2013-06-30 ENCOUNTER — Ambulatory Visit (HOSPITAL_BASED_OUTPATIENT_CLINIC_OR_DEPARTMENT_OTHER): Payer: Medicare Other

## 2013-06-30 ENCOUNTER — Other Ambulatory Visit (HOSPITAL_BASED_OUTPATIENT_CLINIC_OR_DEPARTMENT_OTHER): Payer: Medicare Other | Admitting: Lab

## 2013-06-30 ENCOUNTER — Telehealth: Payer: Self-pay | Admitting: Internal Medicine

## 2013-06-30 VITALS — BP 102/68 | HR 65 | Temp 98.1°F | Resp 18 | Ht 65.5 in | Wt 140.3 lb

## 2013-06-30 DIAGNOSIS — C3490 Malignant neoplasm of unspecified part of unspecified bronchus or lung: Secondary | ICD-10-CM

## 2013-06-30 DIAGNOSIS — Z5111 Encounter for antineoplastic chemotherapy: Secondary | ICD-10-CM

## 2013-06-30 DIAGNOSIS — C7951 Secondary malignant neoplasm of bone: Secondary | ICD-10-CM

## 2013-06-30 DIAGNOSIS — C3410 Malignant neoplasm of upper lobe, unspecified bronchus or lung: Secondary | ICD-10-CM

## 2013-06-30 LAB — CBC WITH DIFFERENTIAL/PLATELET
BASO%: 0.8 % (ref 0.0–2.0)
Eosinophils Absolute: 0 10*3/uL (ref 0.0–0.5)
HGB: 13.2 g/dL (ref 11.6–15.9)
LYMPH%: 36 % (ref 14.0–49.7)
MCV: 94.6 fL (ref 79.5–101.0)
MONO#: 0.5 10*3/uL (ref 0.1–0.9)
MONO%: 9.8 % (ref 0.0–14.0)
NEUT#: 2.6 10*3/uL (ref 1.5–6.5)
Platelets: 280 10*3/uL (ref 145–400)
RBC: 4.08 10*6/uL (ref 3.70–5.45)
WBC: 5 10*3/uL (ref 3.9–10.3)
lymph#: 1.8 10*3/uL (ref 0.9–3.3)
nRBC: 0 % (ref 0–0)

## 2013-06-30 LAB — COMPREHENSIVE METABOLIC PANEL (CC13)
ALT: 18 U/L (ref 0–55)
AST: 25 U/L (ref 5–34)
Albumin: 3.8 g/dL (ref 3.5–5.0)
Alkaline Phosphatase: 69 U/L (ref 40–150)
CO2: 29 mEq/L (ref 22–29)
Calcium: 10.6 mg/dL — ABNORMAL HIGH (ref 8.4–10.4)
Chloride: 101 mEq/L (ref 98–109)
Creatinine: 0.9 mg/dL (ref 0.6–1.1)
Potassium: 4.1 mEq/L (ref 3.5–5.1)
Sodium: 139 mEq/L (ref 136–145)
Total Protein: 7.3 g/dL (ref 6.4–8.3)

## 2013-06-30 MED ORDER — SODIUM CHLORIDE 0.9 % IV SOLN
Freq: Once | INTRAVENOUS | Status: AC
  Administered 2013-06-30: 13:00:00 via INTRAVENOUS

## 2013-06-30 MED ORDER — DEXAMETHASONE SODIUM PHOSPHATE 10 MG/ML IJ SOLN
10.0000 mg | Freq: Once | INTRAMUSCULAR | Status: AC
  Administered 2013-06-30: 10 mg via INTRAVENOUS

## 2013-06-30 MED ORDER — ONDANSETRON 8 MG/NS 50 ML IVPB
INTRAVENOUS | Status: AC
  Filled 2013-06-30: qty 8

## 2013-06-30 MED ORDER — ONDANSETRON 8 MG/50ML IVPB (CHCC)
8.0000 mg | Freq: Once | INTRAVENOUS | Status: AC
  Administered 2013-06-30: 8 mg via INTRAVENOUS

## 2013-06-30 MED ORDER — SODIUM CHLORIDE 0.9 % IV SOLN
500.0000 mg/m2 | Freq: Once | INTRAVENOUS | Status: AC
  Administered 2013-06-30: 875 mg via INTRAVENOUS
  Filled 2013-06-30: qty 35

## 2013-06-30 MED ORDER — DEXAMETHASONE SODIUM PHOSPHATE 10 MG/ML IJ SOLN
INTRAMUSCULAR | Status: AC
  Filled 2013-06-30: qty 1

## 2013-06-30 NOTE — Progress Notes (Addendum)
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   PRIOR THERAPY:  1. Status post left upper lobectomy with mediastinal lymph node dissection under the care of Dr. Dorris Fetch on December 10, 2007.  2. Status post 4 cycles of adjuvant chemotherapy with cisplatin and Taxotere. Last dose was given 03/22/2008.  3. Status post 6 cycles of systemic chemotherapy with carboplatin and Alimta for metastatic disease in the bone. The last dose was given July 02, 2010, with stable disease.  4. Status post 12 cycles of maintenance Alimta at 500 mg/sq m given every 3 weeks. The last dose was given 05/08/2011.  CURRENT THERAPY:  1. Maintenance Alimta at 500 mg/sq m given every 4 weeks. The patient is status post 24 cycles.  2. Zometa 4 mg IV given every 2 months for bone metastasis. Last given 06/03/2013   CHEMOTHERAPY INTENT: Palliative  CURRENT # OF CHEMOTHERAPY CYCLES: 25  CURRENT ANTIEMETICS: none  CURRENT SMOKING STATUS: Never smoker   ORAL CHEMOTHERAPY AND CONSENT: n/a  CURRENT BISPHOSPHONATES USE: Yes, Zometa given every 2 months  PAIN MANAGEMENT: Norco  NARCOTICS INDUCED CONSTIPATION: Occasional, uses MiraLax  LIVING WILL AND CODE STATUS: Has a living will   INTERVAL HISTORY: Tamara Ortiz 72 y.o. female returns to the clinic today for followup visit. The patient is feeling fine today with no specific complaints. She tolerated the last cycle of her systemic chemotherapy fairly well with no significant adverse effects. She denied having any significant chest pain, shortness breath, cough or hemoptysis. She has no nausea or vomiting. She has no fever or chills. She  has a trip planned from July 30, 2013 through August 03, 2013 and would like to postpone her next scheduled cycle of chemotherapy to accommodate this. She reports she received her flu vaccine at her primary care physician's office recently.  MEDICAL HISTORY: Past Medical History  Diagnosis Date  . Hypertension   . HYPERLIPIDEMIA 11/05/2007  . ANXIETY 05/13/2009  . DEPRESSIVE DISORDER 05/06/2008  . HYPERTENSION 11/05/2007  . ALLERGIC RHINITIS 11/05/2007  . CONSTIPATION, CHRONIC 05/13/2009  . UTI 05/06/2008  . BACK PAIN 05/11/2008  . OSTEOPOROSIS 11/05/2007  . INSOMNIA-SLEEP DISORDER-UNSPEC 05/13/2009  . FATIGUE 05/13/2009  . Swelling, mass, or lump in chest 11/05/2007  . LUNG CANCER, HX OF 05/06/2008  . lung ca w/ bone mets dx'd 11/2007    lt hip and spine    ALLERGIES:  is allergic to amoxicillin.  MEDICATIONS:  Current Outpatient Prescriptions  Medication Sig Dispense Refill  . aspirin 81 MG EC tablet Take 81 mg by mouth daily.        . Calcium Carbonate-Vitamin D (CALCIUM-VITAMIN D) 500-200 MG-UNIT per tablet Take 1 tablet by mouth 2 (two) times daily.        . Flaxseed, Linseed, (FLAXSEED OIL) 1000 MG CAPS Take 1,000 mg by mouth daily.        . folic acid (FOLVITE) 1 MG tablet TAKE 1 TABLET BY MOUTH EVERY DAY  90 tablet  1  . HYDROcodone-acetaminophen (NORCO) 5-325 MG per tablet Take 1 tablet by mouth every 6 (six) hours as needed for pain.  30 tablet  0  . LORazepam (ATIVAN)  0.5 MG tablet TAKE 1 TABLET BY MOUTH EVERY 8 HOURS AS NEEDED FOR ANXIETY  40 tablet  1  . losartan-hydrochlorothiazide (HYZAAR) 50-12.5 MG per tablet Take 1 tablet by mouth daily.  90 tablet  3  . Multiple Vitamin (ANTIOXIDANT A/C/Ortiz PO) Take by mouth daily.        . Omega-3 350 MG CAPS Take by mouth daily.        . polyethylene glycol powder (MIRALAX) powder Take 17 g by mouth daily.        . Probiotic Product (PROBIOTIC FORMULA) CAPS Take 1 capsule by mouth daily.        . simvastatin (ZOCOR) 40 MG tablet Take 1 tablet  (40 mg total) by mouth daily.  90 tablet  3  . vitamin B-12 (CYANOCOBALAMIN) 100 MCG tablet Take 50 mcg by mouth daily.        . vitamin Ortiz ([employer] VITAMIN Ortiz) 400 UNIT capsule Take 400 Units by mouth daily.        [name] zolendronic acid (ZOMETA) 4 MG/5ML injection Inject 4 mg into the vein once.        [name] zolpidem (AMBIEN CR) 12.5 MG CR tablet Take 1 tablet (12.5 mg total) by mouth at bedtime as needed for sleep.  90 tablet  1   No current facility-administered medications for this visit.    SURGICAL HISTORY:  Past Surgical History  Procedure Laterality Date  . Tubal ligation    . S/p lul lung surgury  12/2007  . Lobectomy      REVIEW OF SYSTEMS:  A comprehensive review of systems was negative.   PHYSICAL EXAMINATION: General appearance: alert, cooperative and no distress Head: Normocephalic, without obvious abnormality, atraumatic Neck: no adenopathy Lymph nodes: Cervical, supraclavicular, and axillary nodes normal. Resp: clear to auscultation bilaterally Cardio: regular rate and rhythm, S1, S2 normal, no murmur, click, rub or gallop GI: soft, non-tender; bowel sounds normal; no masses,  no organomegaly Extremities: extremities normal, atraumatic, no cyanosis or edema  ECOG PERFORMANCE STATUS: 0 - Asymptomatic  Blood pressure 102/68, pulse 65, temperature 98.1 F (36.7 C), temperature source Oral, resp. rate 18, height 5' 5.5" (1.664 m), weight 140 lb 4.8 oz (63.64 kg).  LABORATORY DATA: Lab Results  Component Value Date   WBC 5.0 06/30/2013   HGB 13.2 06/30/2013   HCT 38.6 06/30/2013   MCV 94.6 06/30/2013   PLT 280 06/30/2013      Chemistry      Component Value Date/Time   NA 139 06/30/2013 1101   NA 135 10/08/2012 1459   NA 140 04/17/2012 0855   K 4.1 06/30/2013 1101   K 3.7 10/08/2012 1459   K 4.1 04/17/2012 0855   CL 100 03/04/2013 0901   CL 96 10/08/2012 1459   CL 99 04/17/2012 0855   CO2 29 06/30/2013 1101   CO2 29 10/08/2012 1459   CO2 29 04/17/2012 0855   BUN 12.2 06/30/2013 1101    BUN 14 10/08/2012 1459   BUN 15 04/17/2012 0855   CREATININE 0.9 06/30/2013 1101   CREATININE 0.96 10/08/2012 1459   CREATININE 1.4* 04/17/2012 0855      Component Value Date/Time   CALCIUM 10.6* 06/30/2013 1101   CALCIUM 9.9 10/08/2012 1459   CALCIUM 9.5 04/17/2012 0855   ALKPHOS 69 06/30/2013 1101   ALKPHOS 74 10/08/2012 1459   ALKPHOS 68 04/17/2012 0855   AST 25 06/30/2013 1101   AST 22 10/08/2012 1459   AST 29 04/17/2012 0855  ALT 18 06/30/2013 1101   ALT 17 10/08/2012 1459   ALT 31 04/17/2012 0855   BILITOT 0.51 06/30/2013 1101   BILITOT 0.2* 10/08/2012 1459   BILITOT 0.60 04/17/2012 0855       RADIOGRAPHIC STUDIES: No results found.  ASSESSMENT AND PLAN: This is a very pleasant 72 years old white female with recurrent non-small cell lung cancer, adenocarcinoma. The patient is currently on maintenance Alimta monthly status post total of 24 cycles. She is tolerating her treatment fairly well with no significant adverse effects. Patient was discussed with also seen by Dr. Arbutus Ped. We will schedule her next cycle of chemotherapy to accommodate her trip. We will schedule her restaging CT scan of the chest, abdomen and pelvis with contrast on 07/28/2013 prior to her leaving for her trip and schedule her followup appointment in her next scheduled cycle of maintenance Alimta on 08/04/2013, she will also be due for her next Zometa infusion at that appointment as well.   Tamara Ortiz, Tamara Laskin E, PA-C    The patient was advised to call immediately if she has any concerning symptoms in the interval.  The patient voices understanding of current disease status and treatment options and is in agreement with the current care plan.  All questions were answered. The patient knows to call the clinic with any problems, questions or concerns. We can certainly see the patient much sooner if necessary.   ADDENDUM: Hematology/Oncology Attending: I have a face to face encounter with the patient today. I recommended her care  plan. She is a very pleasant 72 years old white female with metastatic non-small cell lung cancer, adenocarcinoma currently on maintenance chemotherapy with single agent Alimta every 4 weeks status post 24 cycles. The patient is tolerating her treatment fairly well with no significant adverse effects. We will continuous cycle #25 today as scheduled. The patient has reviewed in the event in 4 weeks and she would like to delay the start of cycle #26 by 1 week. She would come back for followup visit in 5 weeks with repeat CT scan of the chest, abdomen and pelvis for restaging of her disease. Lajuana Matte., MD 06/30/2013

## 2013-06-30 NOTE — Patient Instructions (Addendum)
[facility] Discharge Instructions for Patients Receiving Chemotherapy  Today you received the following chemotherapy agents Alimta.  Take Ativan 0.5mg  tab one every 8 hrs as needed for nausea or anxiety.   If you develop nausea and vomiting that is not controlled by your nausea medication, call the clinic.    BELOW ARE SYMPTOMS THAT SHOULD BE REPORTED IMMEDIATELY:  *FEVER GREATER THAN 100.5 F  *CHILLS WITH OR WITHOUT FEVER  NAUSEA AND VOMITING THAT IS NOT CONTROLLED WITH YOUR NAUSEA MEDICATION  *UNUSUAL SHORTNESS OF BREATH  *UNUSUAL BRUISING OR BLEEDING  TENDERNESS IN MOUTH AND THROAT WITH OR WITHOUT PRESENCE OF ULCERS  *URINARY PROBLEMS  *BOWEL PROBLEMS  UNUSUAL RASH Items with * indicate a potential emergency and should be followed up as soon as possible.  Feel free to call the clinic you have any questions or concerns. The clinic phone number is [phone].

## 2013-06-30 NOTE — Patient Instructions (Signed)
Followup with Dr. Arbutus Ped in 4-5 weeks with restaging a CT scan of the chest, abdomen and pelvis to reevaluate your disease prior to your next scheduled cycle of maintenance chemotherapy

## 2013-06-30 NOTE — Telephone Encounter (Signed)
Gave pt appt for lab and MD for October and November 2014, eamiled [place] regarding chemo

## 2013-07-01 ENCOUNTER — Other Ambulatory Visit: Payer: Medicare Other | Admitting: Lab

## 2013-07-01 ENCOUNTER — Other Ambulatory Visit: Payer: Self-pay | Admitting: Internal Medicine

## 2013-07-01 ENCOUNTER — Ambulatory Visit: Payer: Medicare Other

## 2013-07-02 ENCOUNTER — Telehealth: Payer: Self-pay | Admitting: *Deleted

## 2013-07-02 NOTE — Telephone Encounter (Signed)
Per staff message and POF I have scheduled appts.  [name]  

## 2013-07-07 ENCOUNTER — Telehealth: Payer: Self-pay | Admitting: Internal Medicine

## 2013-07-07 NOTE — Telephone Encounter (Signed)
Talked to pt and gave her appt for October and November 2014 °

## 2013-07-28 ENCOUNTER — Encounter (HOSPITAL_COMMUNITY): Payer: Self-pay

## 2013-07-28 ENCOUNTER — Ambulatory Visit (HOSPITAL_COMMUNITY)
Admission: RE | Admit: 2013-07-28 | Discharge: 2013-07-28 | Disposition: A | Discharge status: Home / Self Care | Payer: Medicare Other | Source: Ambulatory Visit | Attending: Physician Assistant | Admitting: Physician Assistant

## 2013-07-28 ENCOUNTER — Other Ambulatory Visit (HOSPITAL_BASED_OUTPATIENT_CLINIC_OR_DEPARTMENT_OTHER): Payer: Medicare Other | Admitting: Lab

## 2013-07-28 DIAGNOSIS — C7951 Secondary malignant neoplasm of bone: Secondary | ICD-10-CM | POA: Insufficient documentation

## 2013-07-28 DIAGNOSIS — R911 Solitary pulmonary nodule: Secondary | ICD-10-CM | POA: Insufficient documentation

## 2013-07-28 DIAGNOSIS — C3490 Malignant neoplasm of unspecified part of unspecified bronchus or lung: Secondary | ICD-10-CM

## 2013-07-28 DIAGNOSIS — C3410 Malignant neoplasm of upper lobe, unspecified bronchus or lung: Secondary | ICD-10-CM

## 2013-07-28 DIAGNOSIS — K449 Diaphragmatic hernia without obstruction or gangrene: Secondary | ICD-10-CM | POA: Insufficient documentation

## 2013-07-28 DIAGNOSIS — I2510 Atherosclerotic heart disease of native coronary artery without angina pectoris: Secondary | ICD-10-CM | POA: Insufficient documentation

## 2013-07-28 DIAGNOSIS — Z79899 Other long term (current) drug therapy: Secondary | ICD-10-CM | POA: Insufficient documentation

## 2013-07-28 LAB — CBC WITH DIFFERENTIAL/PLATELET
BASO%: 1 % (ref 0.0–2.0)
Basophils Absolute: 0.1 10*3/uL (ref 0.0–0.1)
Eosinophils Absolute: 0 10*3/uL (ref 0.0–0.5)
HGB: 13.4 g/dL (ref 11.6–15.9)
LYMPH%: 31.2 % (ref 14.0–49.7)
MCV: 96.9 fL (ref 79.5–101.0)
MONO#: 0.5 10*3/uL (ref 0.1–0.9)
MONO%: 9.3 % (ref 0.0–14.0)
NEUT#: 3 10*3/uL (ref 1.5–6.5)
Platelets: 283 10*3/uL (ref 145–400)
lymph#: 1.6 10*3/uL (ref 0.9–3.3)

## 2013-07-28 LAB — COMPREHENSIVE METABOLIC PANEL (CC13)
ALT: 18 U/L (ref 0–55)
Albumin: 3.7 g/dL (ref 3.5–5.0)
Alkaline Phosphatase: 56 U/L (ref 40–150)
Anion Gap: 10 mEq/L (ref 3–11)
BUN: 12.2 mg/dL (ref 7.0–26.0)
CO2: 30 mEq/L — ABNORMAL HIGH (ref 22–29)
Calcium: 10.7 mg/dL — ABNORMAL HIGH (ref 8.4–10.4)
Chloride: 101 mEq/L (ref 98–109)
Glucose: 96 mg/dl (ref 70–140)
Potassium: 4.2 mEq/L (ref 3.5–5.1)

## 2013-07-28 MED ORDER — IOHEXOL 300 MG/ML  SOLN
100.0000 mL | Freq: Once | INTRAMUSCULAR | Status: AC | PRN
  Administered 2013-07-28: 100 mL via INTRAVENOUS

## 2013-08-04 ENCOUNTER — Telehealth: Payer: Self-pay | Admitting: Internal Medicine

## 2013-08-04 ENCOUNTER — Ambulatory Visit (HOSPITAL_BASED_OUTPATIENT_CLINIC_OR_DEPARTMENT_OTHER): Payer: Medicare Other

## 2013-08-04 ENCOUNTER — Other Ambulatory Visit (HOSPITAL_BASED_OUTPATIENT_CLINIC_OR_DEPARTMENT_OTHER): Payer: Medicare Other | Admitting: Lab

## 2013-08-04 ENCOUNTER — Encounter: Payer: Self-pay | Admitting: Internal Medicine

## 2013-08-04 ENCOUNTER — Ambulatory Visit (HOSPITAL_BASED_OUTPATIENT_CLINIC_OR_DEPARTMENT_OTHER): Payer: Medicare Other | Admitting: Internal Medicine

## 2013-08-04 ENCOUNTER — Telehealth: Payer: Self-pay | Admitting: *Deleted

## 2013-08-04 VITALS — BP 125/76 | HR 54 | Temp 97.0°F | Resp 18 | Ht 65.0 in | Wt 143.2 lb

## 2013-08-04 DIAGNOSIS — C3410 Malignant neoplasm of upper lobe, unspecified bronchus or lung: Secondary | ICD-10-CM

## 2013-08-04 DIAGNOSIS — C7951 Secondary malignant neoplasm of bone: Secondary | ICD-10-CM

## 2013-08-04 DIAGNOSIS — C3430 Malignant neoplasm of lower lobe, unspecified bronchus or lung: Secondary | ICD-10-CM

## 2013-08-04 DIAGNOSIS — Z5111 Encounter for antineoplastic chemotherapy: Secondary | ICD-10-CM

## 2013-08-04 DIAGNOSIS — C3490 Malignant neoplasm of unspecified part of unspecified bronchus or lung: Secondary | ICD-10-CM

## 2013-08-04 LAB — CBC WITH DIFFERENTIAL/PLATELET
BASO%: 1.1 % (ref 0.0–2.0)
EOS%: 0.9 % (ref 0.0–7.0)
MCH: 32.4 pg (ref 25.1–34.0)
MCHC: 33.8 g/dL (ref 31.5–36.0)
MONO#: 0.4 10*3/uL (ref 0.1–0.9)
NEUT#: 2.8 10*3/uL (ref 1.5–6.5)
Platelets: 212 10*3/uL (ref 145–400)
RBC: 4.1 10*6/uL (ref 3.70–5.45)
RDW: 12.7 % (ref 11.2–14.5)
WBC: 4.6 10*3/uL (ref 3.9–10.3)
lymph#: 1.4 10*3/uL (ref 0.9–3.3)
nRBC: 0 % (ref 0–0)

## 2013-08-04 LAB — COMPREHENSIVE METABOLIC PANEL (CC13)
ALT: 22 U/L (ref 0–55)
AST: 27 U/L (ref 5–34)
Albumin: 3.9 g/dL (ref 3.5–5.0)
Alkaline Phosphatase: 65 U/L (ref 40–150)
BUN: 13.4 mg/dL (ref 7.0–26.0)
Potassium: 4.2 mEq/L (ref 3.5–5.1)
Sodium: 142 mEq/L (ref 136–145)
Total Bilirubin: 0.43 mg/dL (ref 0.20–1.20)
Total Protein: 7.7 g/dL (ref 6.4–8.3)

## 2013-08-04 MED ORDER — ONDANSETRON 8 MG/NS 50 ML IVPB
INTRAVENOUS | Status: AC
  Filled 2013-08-04: qty 8

## 2013-08-04 MED ORDER — SODIUM CHLORIDE 0.9 % IV SOLN
Freq: Once | INTRAVENOUS | Status: AC
  Administered 2013-08-04: 11:00:00 via INTRAVENOUS

## 2013-08-04 MED ORDER — DEXAMETHASONE SODIUM PHOSPHATE 10 MG/ML IJ SOLN
10.0000 mg | Freq: Once | INTRAMUSCULAR | Status: AC
  Administered 2013-08-04: 10 mg via INTRAVENOUS

## 2013-08-04 MED ORDER — ZOLEDRONIC ACID 4 MG/100ML IV SOLN
4.0000 mg | Freq: Once | INTRAVENOUS | Status: AC
  Administered 2013-08-04: 4 mg via INTRAVENOUS
  Filled 2013-08-04: qty 100

## 2013-08-04 MED ORDER — SODIUM CHLORIDE 0.9 % IV SOLN
500.0000 mg/m2 | Freq: Once | INTRAVENOUS | Status: AC
  Administered 2013-08-04: 875 mg via INTRAVENOUS
  Filled 2013-08-04: qty 35

## 2013-08-04 MED ORDER — ONDANSETRON 8 MG/50ML IVPB (CHCC)
8.0000 mg | Freq: Once | INTRAVENOUS | Status: AC
  Administered 2013-08-04: 8 mg via INTRAVENOUS

## 2013-08-04 MED ORDER — DEXAMETHASONE SODIUM PHOSPHATE 10 MG/ML IJ SOLN
INTRAMUSCULAR | Status: AC
  Filled 2013-08-04: qty 1

## 2013-08-04 MED ORDER — CYANOCOBALAMIN 1000 MCG/ML IJ SOLN
INTRAMUSCULAR | Status: AC
  Filled 2013-08-04: qty 1

## 2013-08-04 MED ORDER — CYANOCOBALAMIN 1000 MCG/ML IJ SOLN
1000.0000 ug | Freq: Once | INTRAMUSCULAR | Status: AC
  Administered 2013-08-04: 1000 ug via INTRAMUSCULAR

## 2013-08-04 NOTE — Patient Instructions (Addendum)
[facility] Discharge Instructions for Patients Receiving Chemotherapy  Today you received the following chemotherapy agents Alimta, Zometa and B12 injection. To help prevent nausea and vomiting after your treatment, we encourage you to take your nausea medication as per Dr. Arbutus Ped. Pt. is using ativan when needed per prescribed. If you develop nausea and vomiting that is not controlled by your nausea medication, call the clinic.   BELOW ARE SYMPTOMS THAT SHOULD BE REPORTED IMMEDIATELY:  *FEVER GREATER THAN 100.5 F  *CHILLS WITH OR WITHOUT FEVER  NAUSEA AND VOMITING THAT IS NOT CONTROLLED WITH YOUR NAUSEA MEDICATION  *UNUSUAL SHORTNESS OF BREATH  *UNUSUAL BRUISING OR BLEEDING  TENDERNESS IN MOUTH AND THROAT WITH OR WITHOUT PRESENCE OF ULCERS  *URINARY PROBLEMS  *BOWEL PROBLEMS  UNUSUAL RASH Items with * indicate a potential emergency and should be followed up as soon as possible.  Feel free to call the clinic you have any questions or concerns. The clinic phone number is [phone].

## 2013-08-04 NOTE — Patient Instructions (Signed)
No evidence for disease progression on his recent scan.  Continue maintenance chemotherapy with single agent Alimta.  Followup visit in one month.

## 2013-08-04 NOTE — Progress Notes (Signed)
[facility] [phone]   Fax:([phone]  OFFICE PROGRESS NOTE  [name], MD 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   PRIOR THERAPY:  1. Status post left upper lobectomy with mediastinal lymph node dissection under the care of Dr. Dorris Fetch on December 10, 2007.  2. Status post 4 cycles of adjuvant chemotherapy with cisplatin and Taxotere. Last dose was given 03/22/2008.  3. Status post 6 cycles of systemic chemotherapy with carboplatin and Alimta for metastatic disease in the bone. The last dose was given July 02, 2010, with stable disease.  4. Status post 12 cycles of maintenance Alimta at 500 mg/sq m given every 3 weeks. The last dose was given 05/08/2011.  CURRENT THERAPY:  1. Maintenance Alimta at 500 mg/sq m given every 4 weeks. The patient is status post 25 cycles.  2. Zometa 4 mg IV given every 2 months for bone metastasis.   CHEMOTHERAPY INTENT: Palliative  CURRENT # OF CHEMOTHERAPY CYCLES: 25 CURRENT ANTIEMETICS: none  CURRENT SMOKING STATUS: Never smoker   ORAL CHEMOTHERAPY AND CONSENT: n/a  CURRENT BISPHOSPHONATES USE: Yes, Zometa given every 2 months  PAIN MANAGEMENT: Norco  NARCOTICS INDUCED CONSTIPATION: Occasional, uses MiraLax  LIVING WILL AND CODE STATUS: Has a living will   INTERVAL HISTORY: Tamara Ortiz 72 y.o. female returns to the clinic today for followup visit. The patient is feeling fine today with no specific complaints, except for mild low back pain especially late in the day after exertion. She tolerated the last cycle of her systemic chemotherapy fairly well with no significant adverse effects. She denied having any significant chest pain, shortness of breath, cough or hemoptysis. She has no  nausea or vomiting. She has no fever or chills. She has no significant weight loss or night sweats. The patient had repeat CT scan of the chest, abdomen and pelvis performed recently and she is here for evaluation and discussion of her scan results.  MEDICAL HISTORY: Past Medical History  Diagnosis Date  . Hypertension   . HYPERLIPIDEMIA 11/05/2007  . ANXIETY 05/13/2009  . DEPRESSIVE DISORDER 05/06/2008  . HYPERTENSION 11/05/2007  . ALLERGIC RHINITIS 11/05/2007  . CONSTIPATION, CHRONIC 05/13/2009  . UTI 05/06/2008  . BACK PAIN 05/11/2008  . OSTEOPOROSIS 11/05/2007  . INSOMNIA-SLEEP DISORDER-UNSPEC 05/13/2009  . FATIGUE 05/13/2009  . Swelling, mass, or lump in chest 11/05/2007  . LUNG CANCER, HX OF 05/06/2008  . lung ca w/ bone mets dx'd 11/2007    lt hip and spine    ALLERGIES:  is allergic to amoxicillin.  MEDICATIONS:  Current Outpatient Prescriptions  Medication Sig Dispense Refill  . aspirin 81 MG EC tablet Take 81 mg by mouth daily.        . Calcium Carbonate-Vitamin D (CALCIUM-VITAMIN D) 500-200 MG-UNIT per tablet Take 1 tablet by mouth 2 (two) times daily.        . Flaxseed, Linseed, (FLAXSEED OIL) 1000 MG CAPS Take 1,000 mg by mouth daily.        . folic acid (FOLVITE) 1 MG tablet TAKE 1 TABLET BY MOUTH EVERY DAY  90 tablet  1  . HYDROcodone-acetaminophen (NORCO) 5-325 MG per tablet Take 1 tablet by mouth every 6 (six) hours as needed for pain.  30 tablet  0  .  LORazepam (ATIVAN) 0.5 MG tablet TAKE 1 TABLET BY MOUTH EVERY 8 HOURS AS NEEDED FOR ANXIETY  40 tablet  1  . losartan-hydrochlorothiazide (HYZAAR) 50-12.5 MG per tablet Take 1 tablet by mouth daily.  90 tablet  3  . Multiple Vitamin (ANTIOXIDANT A/C/E PO) Take by mouth daily.        . Omega-3 350 MG CAPS Take by mouth daily.        . polyethylene glycol powder (MIRALAX) powder Take 17 g by mouth daily.        . Probiotic Product (PROBIOTIC FORMULA) CAPS Take 1 capsule by mouth daily.        . simvastatin (ZOCOR) 40 MG tablet Take 1  tablet (40 mg total) by mouth daily.  90 tablet  3  . vitamin B-12 (CYANOCOBALAMIN) 100 MCG tablet Take 50 mcg by mouth daily.        . vitamin E ([employer]S VITAMIN E) 400 UNIT capsule Take 400 Units by mouth daily.        [name] zolendronic acid (ZOMETA) 4 MG/5ML injection Inject 4 mg into the vein once.        [name] zolpidem (AMBIEN CR) 12.5 MG CR tablet Take 1 tablet (12.5 mg total) by mouth at bedtime as needed for sleep.  90 tablet  1   No current facility-administered medications for this visit.    SURGICAL HISTORY:  Past Surgical History  Procedure Laterality Date  . Tubal ligation    . S/p lul lung surgury  12/2007  . Lobectomy      REVIEW OF SYSTEMS:  A comprehensive review of systems was negative.   PHYSICAL EXAMINATION: General appearance: alert, cooperative and no distress Head: Normocephalic, without obvious abnormality, atraumatic Neck: no adenopathy Lymph nodes: Cervical, supraclavicular, and axillary nodes normal. Resp: clear to auscultation bilaterally Cardio: regular rate and rhythm, S1, S2 normal, no murmur, click, rub or gallop GI: soft, non-tender; bowel sounds normal; no masses,  no organomegaly Extremities: extremities normal, atraumatic, no cyanosis or edema  ECOG PERFORMANCE STATUS: 0 - Asymptomatic  Blood pressure 125/76, pulse 54, temperature 97 F (36.1 C), temperature source Oral, resp. rate 18, height 5\' 5"  (1.651 m), weight 143 lb 3.2 oz (64.955 kg), SpO2 97.00%.  LABORATORY DATA: Lab Results  Component Value Date   WBC 4.6 08/04/2013   HGB 13.3 08/04/2013   HCT 39.4 08/04/2013   MCV 96.1 08/04/2013   PLT 212 08/04/2013      Chemistry      Component Value Date/Time   NA 142 08/04/2013 0906   NA 135 10/08/2012 1459   NA 140 04/17/2012 0855   K 4.2 08/04/2013 0906   K 3.7 10/08/2012 1459   K 4.1 04/17/2012 0855   CL 100 03/04/2013 0901   CL 96 10/08/2012 1459   CL 99 04/17/2012 0855   CO2 26 08/04/2013 0906   CO2 29 10/08/2012 1459   CO2 29 04/17/2012 0855   BUN 13.4  08/04/2013 0906   BUN 14 10/08/2012 1459   BUN 15 04/17/2012 0855   CREATININE 0.9 08/04/2013 0906   CREATININE 0.96 10/08/2012 1459   CREATININE 1.4* 04/17/2012 0855      Component Value Date/Time   CALCIUM 10.4 08/04/2013 0906   CALCIUM 9.9 10/08/2012 1459   CALCIUM 9.5 04/17/2012 0855   ALKPHOS 65 08/04/2013 0906   ALKPHOS 74 10/08/2012 1459   ALKPHOS 68 04/17/2012 0855   AST 27 08/04/2013 0906   AST 22 10/08/2012 1459   AST  29 04/17/2012 0855   ALT 22 08/04/2013 0906   ALT 17 10/08/2012 1459   ALT 31 04/17/2012 0855   BILITOT 0.43 08/04/2013 0906   BILITOT 0.2* 10/08/2012 1459   BILITOT 0.60 04/17/2012 0855       RADIOGRAPHIC STUDIES: Ct Chest W Contrast  07/28/2013   CLINICAL DATA:  Lung cancer with resection ongoing chemotherapy.  EXAM: CT CHEST, ABDOMEN, AND PELVIS WITH CONTRAST  TECHNIQUE: Multidetector CT imaging of the chest, abdomen and pelvis was performed following the standard protocol during bolus administration of intravenous contrast.  CONTRAST:  OMNIPAQUE IOHEXOL 300 MG/ML  SOLN  COMPARISON:  04/01/2013.  FINDINGS:   CT CHEST FINDINGS  No pathologically enlarged mediastinal, hilar or axillary lymph nodes. Atherosclerotic calcification of the arterial vasculature, including coronary arteries. Heart is at the upper limits of normal in size. No pericardial effusion. Tiny hiatal hernia.  Postoperative changes of left upper lobectomy. 3 mm apical segment right upper lobe nodule (image 13) is unchanged. No pleural fluid. Airway is otherwise unremarkable.    CT ABDOMEN AND PELVIS FINDINGS  Liver, gallbladder, adrenal glands, kidneys, spleen, pancreas, stomach and small bowel are unremarkable. A fair amount of stool is seen in the colon, suggestive of constipation.  Uterus and ovaries are visualized. Scattered atherosclerotic calcification of the arterial vasculature without abdominal aortic aneurysm. No pathologically enlarged lymph nodes. No free fluid.  Scattered sclerotic lesions in the  spine and pelvis appear unchanged.  IMPRESSION: CT CHEST IMPRESSION  1. Postoperative changes of left upper lobectomy without evidence of recurrent disease. 2. Coronary artery calcification.  CT ABDOMEN AND PELVIS IMPRESSION  1. No evidence of metastatic disease in the abdomen or pelvis. 2. Stable osseous metastatic disease.   Electronically Signed   By: Leanna Battles M.D.   On: 07/28/2013 14:29     ASSESSMENT AND PLAN: This is a very pleasant 72 years old white female with recurrent non-small cell lung cancer, adenocarcinoma. The patient is currently on maintenance Alimta monthly status post total of 25 cycles.  Her recent CT scan of the chest, abdomen and pelvis showed no evidence for disease progression. I discussed the scan results with the patient today. She is tolerating her treatment fairly well with no significant adverse effects. I recommended for the patient to proceed with cycle #26 today as scheduled. She would come back for followup visit in one month for evaluation before starting the next cycle of her chemotherapy. The patient was advised to call immediately if she has any concerning symptoms in the interval.  The patient voices understanding of current disease status and treatment options and is in agreement with the current care plan.  All questions were answered. The patient knows to call the clinic with any problems, questions or concerns. We can certainly see the patient much sooner if necessary. I spent 15 minutes counseling the patient face to face. The total time spent in the appointment was 25 minutes.

## 2013-08-04 NOTE — Telephone Encounter (Signed)
Gave pt appt for lab and ML, emailed Marcelino Duster regarding chemo for December 2014

## 2013-08-04 NOTE — Telephone Encounter (Signed)
Per staff message and POF I have scheduled appts.  [name]  

## 2013-08-06 ENCOUNTER — Telehealth: Payer: Self-pay | Admitting: Internal Medicine

## 2013-08-06 NOTE — Telephone Encounter (Signed)
Talked to pt and gave her appt for December ,January 2015, lab,ML and chemo

## 2013-08-23 ENCOUNTER — Other Ambulatory Visit: Payer: Self-pay | Admitting: Physician Assistant

## 2013-08-25 ENCOUNTER — Encounter: Payer: Self-pay | Admitting: *Deleted

## 2013-08-25 ENCOUNTER — Encounter: Payer: Self-pay | Admitting: Physician Assistant

## 2013-08-25 NOTE — Telephone Encounter (Signed)
Called patient and left voicemail before sending MyChart e-mail inquiring about the refill request.  Awaiting return call from patient.  As of 3:25 pm, no return call.

## 2013-09-01 ENCOUNTER — Telehealth: Payer: Self-pay | Admitting: *Deleted

## 2013-09-01 ENCOUNTER — Other Ambulatory Visit (HOSPITAL_BASED_OUTPATIENT_CLINIC_OR_DEPARTMENT_OTHER): Payer: Medicare Other

## 2013-09-01 ENCOUNTER — Ambulatory Visit (HOSPITAL_BASED_OUTPATIENT_CLINIC_OR_DEPARTMENT_OTHER): Payer: Medicare Other

## 2013-09-01 ENCOUNTER — Ambulatory Visit (HOSPITAL_BASED_OUTPATIENT_CLINIC_OR_DEPARTMENT_OTHER): Payer: Medicare Other | Admitting: Physician Assistant

## 2013-09-01 ENCOUNTER — Encounter: Payer: Self-pay | Admitting: Physician Assistant

## 2013-09-01 ENCOUNTER — Other Ambulatory Visit: Payer: Self-pay | Admitting: Internal Medicine

## 2013-09-01 ENCOUNTER — Encounter: Payer: Self-pay | Admitting: *Deleted

## 2013-09-01 DIAGNOSIS — C7951 Secondary malignant neoplasm of bone: Secondary | ICD-10-CM

## 2013-09-01 DIAGNOSIS — K5900 Constipation, unspecified: Secondary | ICD-10-CM

## 2013-09-01 DIAGNOSIS — C3410 Malignant neoplasm of upper lobe, unspecified bronchus or lung: Secondary | ICD-10-CM

## 2013-09-01 DIAGNOSIS — K1370 Unspecified lesions of oral mucosa: Secondary | ICD-10-CM

## 2013-09-01 DIAGNOSIS — C3490 Malignant neoplasm of unspecified part of unspecified bronchus or lung: Secondary | ICD-10-CM

## 2013-09-01 DIAGNOSIS — Z5111 Encounter for antineoplastic chemotherapy: Secondary | ICD-10-CM

## 2013-09-01 LAB — CBC WITH DIFFERENTIAL/PLATELET
Basophils Absolute: 0 10*3/uL (ref 0.0–0.1)
EOS%: 0.8 % (ref 0.0–7.0)
Eosinophils Absolute: 0 10*3/uL (ref 0.0–0.5)
HCT: 39.7 % (ref 34.8–46.6)
HGB: 13.4 g/dL (ref 11.6–15.9)
LYMPH%: 34.3 % (ref 14.0–49.7)
MCH: 33.2 pg (ref 25.1–34.0)
MCV: 98.2 fL (ref 79.5–101.0)
MONO%: 11.9 % (ref 0.0–14.0)
NEUT#: 2.2 10*3/uL (ref 1.5–6.5)
Platelets: 272 10*3/uL (ref 145–400)
RBC: 4.04 10*6/uL (ref 3.70–5.45)

## 2013-09-01 LAB — COMPREHENSIVE METABOLIC PANEL (CC13)
Alkaline Phosphatase: 70 U/L (ref 40–150)
Anion Gap: 10 mEq/L (ref 3–11)
BUN: 14.4 mg/dL (ref 7.0–26.0)
Calcium: 10.9 mg/dL — ABNORMAL HIGH (ref 8.4–10.4)
Glucose: 102 mg/dl (ref 70–140)
Sodium: 139 mEq/L (ref 136–145)
Total Bilirubin: 0.5 mg/dL (ref 0.20–1.20)
Total Protein: 7.5 g/dL (ref 6.4–8.3)

## 2013-09-01 MED ORDER — DEXAMETHASONE SODIUM PHOSPHATE 10 MG/ML IJ SOLN
10.0000 mg | Freq: Once | INTRAMUSCULAR | Status: AC
  Administered 2013-09-01: 10 mg via INTRAVENOUS

## 2013-09-01 MED ORDER — DEXAMETHASONE SODIUM PHOSPHATE 10 MG/ML IJ SOLN
INTRAMUSCULAR | Status: AC
  Filled 2013-09-01: qty 1

## 2013-09-01 MED ORDER — SODIUM CHLORIDE 0.9 % IV SOLN
Freq: Once | INTRAVENOUS | Status: AC
  Administered 2013-09-01: 12:00:00 via INTRAVENOUS

## 2013-09-01 MED ORDER — ONDANSETRON 8 MG/NS 50 ML IVPB
INTRAVENOUS | Status: AC
  Filled 2013-09-01: qty 8

## 2013-09-01 MED ORDER — HYDROCODONE-ACETAMINOPHEN 5-325 MG PO TABS: 1.0000 | ORAL_TABLET | Freq: Four times a day (QID) | ORAL | Status: DC | PRN

## 2013-09-01 MED ORDER — SODIUM CHLORIDE 0.9 % IV SOLN
500.0000 mg/m2 | Freq: Once | INTRAVENOUS | Status: AC
  Administered 2013-09-01: 875 mg via INTRAVENOUS
  Filled 2013-09-01: qty 35

## 2013-09-01 MED ORDER — MAGIC MOUTHWASH: 5.0000 mL | Freq: Four times a day (QID) | ORAL | Status: DC

## 2013-09-01 MED ORDER — ONDANSETRON 8 MG/50ML IVPB (CHCC)
8.0000 mg | Freq: Once | INTRAVENOUS | Status: AC
  Administered 2013-09-01: 8 mg via INTRAVENOUS

## 2013-09-01 NOTE — Telephone Encounter (Signed)
Per staff message and POF I have scheduled appts.  [name]  

## 2013-09-01 NOTE — Progress Notes (Addendum)
[facility] [phone]   [phone]  SHARED VISIT PROGRESS NOTE  [name], MD 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   PRIOR THERAPY:  1. Status post left upper lobectomy with mediastinal lymph node dissection under the care of Dr. Dorris Fetch on December 10, 2007.  2. Status post 4 cycles of adjuvant chemotherapy with cisplatin and Taxotere. Last dose was given 03/22/2008.  3. Status post 6 cycles of systemic chemotherapy with carboplatin and Alimta for metastatic disease in the bone. The last dose was given July 02, 2010, with stable disease.  4. Status post 12 cycles of maintenance Alimta at 500 mg/sq m given every 3 weeks. The last dose was given 05/08/2011.  CURRENT THERAPY:  1. Maintenance Alimta at 500 mg/sq m given every 4 weeks. The patient is status post 26 cycles.  2. Zometa 4 mg IV given every 2 months for bone metastasis.   CHEMOTHERAPY INTENT: Palliative  CURRENT # OF CHEMOTHERAPY CYCLES: 27 CURRENT ANTIEMETICS: none  CURRENT SMOKING STATUS: Never smoker   ORAL CHEMOTHERAPY AND CONSENT: n/a  CURRENT BISPHOSPHONATES USE: Yes, Zometa given every 2 months  PAIN MANAGEMENT: Norco  NARCOTICS INDUCED CONSTIPATION: Occasional, uses MiraLax  LIVING WILL AND CODE STATUS: Has a living will   INTERVAL HISTORY: Tamara Ortiz 72 y.o. female returns to the clinic today for followup visit. The patient is feeling fine today with no specific complaints, except for increased low back pain this past week. She admits to being more active with shopping and dancing. Her mattress may also be an issue. She tolerated the last cycle of her systemic chemotherapy fairly well with no significant adverse effects. She reports an issue  with constipation. She also has developed some mouth sores/ulcerations. She had a little Magic mouthwash remaining and found this was helpful. She would like a refill for the match mouthwash. She'll so requests a refill for her hydrocodone tablets for pain management. She denied having any significant chest pain, shortness of breath, cough or hemoptysis. She has no nausea or vomiting. She has no fever or chills. She has no significant weight loss or night sweats.   MEDICAL HISTORY: Past Medical History  Diagnosis Date  . Hypertension   . HYPERLIPIDEMIA 11/05/2007  . ANXIETY 05/13/2009  . DEPRESSIVE DISORDER 05/06/2008  . HYPERTENSION 11/05/2007  . ALLERGIC RHINITIS 11/05/2007  . CONSTIPATION, CHRONIC 05/13/2009  . UTI 05/06/2008  . BACK PAIN 05/11/2008  . OSTEOPOROSIS 11/05/2007  . INSOMNIA-SLEEP DISORDER-UNSPEC 05/13/2009  . FATIGUE 05/13/2009  . Swelling, mass, or lump in chest 11/05/2007  . LUNG CANCER, HX OF 05/06/2008  . lung ca w/ bone mets dx'd 11/2007    lt hip and spine    ALLERGIES:  is allergic to amoxicillin.  MEDICATIONS:  Current Outpatient Prescriptions  Medication Sig Dispense Refill  . aspirin 81 MG EC tablet Take 81 mg by mouth daily.        . Calcium Carbonate-Vitamin D (CALCIUM-VITAMIN D) 500-200 MG-UNIT per tablet Take 1 tablet by mouth 2 (two) times daily.        . Flaxseed, Linseed, (FLAXSEED OIL) 1000 MG CAPS Take 1,000 mg by mouth daily.        . folic acid (FOLVITE) 1 MG tablet TAKE 1 TABLET BY  MOUTH EVERY DAY  90 tablet  1  . HYDROcodone-acetaminophen (NORCO) 5-325 MG per tablet Take 1 tablet by mouth every 6 (six) hours as needed.  30 tablet  0  . LORazepam (ATIVAN) 0.5 MG tablet TAKE 1 TABLET BY MOUTH EVERY 8 HOURS AS NEEDED FOR ANXIETY  40 tablet  1  . losartan-hydrochlorothiazide (HYZAAR) 50-12.5 MG per tablet TAKE 1 TABLET EVERY DAY  90 tablet  2  . Multiple Vitamin (ANTIOXIDANT A/C/E PO) Take by mouth daily.        . Omega-3 350 MG CAPS Take by mouth daily.        .  polyethylene glycol powder (MIRALAX) powder Take 17 g by mouth daily.        . Probiotic Product (PROBIOTIC FORMULA) CAPS Take 1 capsule by mouth daily.        . simvastatin (ZOCOR) 40 MG tablet Take 1 tablet (40 mg total) by mouth daily.  90 tablet  3  . vitamin B-12 (CYANOCOBALAMIN) 100 MCG tablet Take 50 mcg by mouth daily.        . vitamin E ([employer] VITAMIN E) 400 UNIT capsule Take 400 Units by mouth daily.        [name] zolendronic acid (ZOMETA) 4 MG/5ML injection Inject 4 mg into the vein once.        [name] zolpidem (AMBIEN CR) 12.5 MG CR tablet Take 1 tablet (12.5 mg total) by mouth at bedtime as needed for sleep.  90 tablet  1  . Alum & Mag Hydroxide-Simeth (MAGIC MOUTHWASH) SOLN Take 5 mLs by mouth 4 (four) times daily. Swish and spit  240 mL  0   No current facility-administered medications for this visit.    SURGICAL HISTORY:  Past Surgical History  Procedure Laterality Date  . Tubal ligation    . S/p lul lung surgury  12/2007  . Lobectomy      REVIEW OF SYSTEMS:  A comprehensive review of systems was negative except for: Ears, nose, mouth, throat, and face: positive for sore mouth Gastrointestinal: positive for constipation Musculoskeletal: positive for back pain   PHYSICAL EXAMINATION: General appearance: alert, cooperative and no distress Head: Normocephalic, without obvious abnormality, atraumatic Neck: no adenopathy Lymph nodes: Cervical, supraclavicular, and axillary nodes normal. Resp: clear to auscultation bilaterally Cardio: regular rate and rhythm, S1, S2 normal, no murmur, click, rub or gallop GI: soft, non-tender; bowel sounds normal; no masses,  no organomegaly Extremities: extremities normal, atraumatic, no cyanosis or edema Mouth: Reveals a few mild ulcerations in the buccal mucosa right more so than the left  ECOG PERFORMANCE STATUS: 0 - Asymptomatic  Blood pressure 135/81, pulse 59, temperature 97.7 F (36.5 C), temperature source Oral, resp. rate 18, height 5\' 5"   (1.651 m), weight 140 lb 8 oz (63.73 kg), SpO2 97.00%.  LABORATORY DATA: Lab Results  Component Value Date   WBC 4.1 09/01/2013   HGB 13.4 09/01/2013   HCT 39.7 09/01/2013   MCV 98.2 09/01/2013   PLT 272 09/01/2013      Chemistry      Component Value Date/Time   NA 139 09/01/2013 1028   NA 135 10/08/2012 1459   NA 140 04/17/2012 0855   K 4.0 09/01/2013 1028   K 3.7 10/08/2012 1459   K 4.1 04/17/2012 0855   CL 100 03/04/2013 0901   CL 96 10/08/2012 1459   CL 99 04/17/2012 0855   CO2 28 09/01/2013 1028   CO2 29 10/08/2012 1459   CO2 29 04/17/2012  0855   BUN 14.4 09/01/2013 1028   BUN 14 10/08/2012 1459   BUN 15 04/17/2012 0855   CREATININE 0.9 09/01/2013 1028   CREATININE 0.96 10/08/2012 1459   CREATININE 1.4* 04/17/2012 0855      Component Value Date/Time   CALCIUM 10.9* 09/01/2013 1028   CALCIUM 9.9 10/08/2012 1459   CALCIUM 9.5 04/17/2012 0855   ALKPHOS 70 09/01/2013 1028   ALKPHOS 74 10/08/2012 1459   ALKPHOS 68 04/17/2012 0855   AST 28 09/01/2013 1028   AST 22 10/08/2012 1459   AST 29 04/17/2012 0855   ALT 23 09/01/2013 1028   ALT 17 10/08/2012 1459   ALT 31 04/17/2012 0855   BILITOT 0.50 09/01/2013 1028   BILITOT 0.2* 10/08/2012 1459   BILITOT 0.60 04/17/2012 0855       RADIOGRAPHIC STUDIES: Ct Chest W Contrast  07/28/2013   CLINICAL DATA:  Lung cancer with resection ongoing chemotherapy.  EXAM: CT CHEST, ABDOMEN, AND PELVIS WITH CONTRAST  TECHNIQUE: Multidetector CT imaging of the chest, abdomen and pelvis was performed following the standard protocol during bolus administration of intravenous contrast.  CONTRAST:  OMNIPAQUE IOHEXOL 300 MG/ML  SOLN  COMPARISON:  04/01/2013.  FINDINGS:   CT CHEST FINDINGS  No pathologically enlarged mediastinal, hilar or axillary lymph nodes. Atherosclerotic calcification of the arterial vasculature, including coronary arteries. Heart is at the upper limits of normal in size. No pericardial effusion. Tiny hiatal hernia.  Postoperative changes of left upper  lobectomy. 3 mm apical segment right upper lobe nodule (image 13) is unchanged. No pleural fluid. Airway is otherwise unremarkable.    CT ABDOMEN AND PELVIS FINDINGS  Liver, gallbladder, adrenal glands, kidneys, spleen, pancreas, stomach and small bowel are unremarkable. A fair amount of stool is seen in the colon, suggestive of constipation.  Uterus and ovaries are visualized. Scattered atherosclerotic calcification of the arterial vasculature without abdominal aortic aneurysm. No pathologically enlarged lymph nodes. No free fluid.  Scattered sclerotic lesions in the spine and pelvis appear unchanged.  IMPRESSION: CT CHEST IMPRESSION  1. Postoperative changes of left upper lobectomy without evidence of recurrent disease. 2. Coronary artery calcification.  CT ABDOMEN AND PELVIS IMPRESSION  1. No evidence of metastatic disease in the abdomen or pelvis. 2. Stable osseous metastatic disease.   Electronically Signed   By: Leanna Battles M.D.   On: 07/28/2013 14:29     ASSESSMENT AND PLAN: This is a very pleasant 72 years old white female with recurrent non-small cell lung cancer, adenocarcinoma. The patient is currently on maintenance Alimta monthly status post total of 26 cycles. Her recent CT scan of the chest, abdomen and pelvis showed no evidence for disease progression. She is tolerating her treatment fairly well with no significant adverse effects. Patient was discussed with an also seen by Dr. Arbutus Ped. She will proceed with cycle #27 today as scheduled. For her back pain her hydrocodone was refilled. For the mouth ulcers, a prescription for Magic mouthwash was also refilled. Her constipation is managed well with over-the-counter stool softeners. She would like to feel well for New Year's Eve and go dancing and therefore would like to postpone her chemotherapy for an additional week. We advised against this and instead suggested that she have her chemotherapy in 3 weeks  instead of her usual every 4 week  interval. Patient was in agreement with this plan and she will return in 3 weeks prior to cycle #28 of her maintenance chemotherapy with single agent Alimta.  The patient  was advised to call immediately if she has any concerning symptoms in the interval.  The patient voices understanding of current disease status and treatment options and is in agreement with the current care plan.  All questions were answered. The patient knows to call the clinic with any problems, questions or concerns. We can certainly see the patient much sooner if necessary. .  ADDENDUM: Hematology/Oncology Attending: I had a face to face encounter with the patient. I recommended for her care plan. The patient is a very pleasant 72 years old white female with metastatic non-small cell lung cancer currently undergoing maintenance chemotherapy with single agent Alimta status post 26 cycles. She is tolerating her treatment fairly well with no significant adverse effects except for occasional low back pain. I recommended for the patient to continue her current treatment with maintenance Alimta as scheduled. She would come back for follow up visit in 3 weeks with the next cycle of her treatment. She was advised to call immediately if she has any concerning symptoms in the interval. Lajuana Matte., MD 09/03/2013

## 2013-09-01 NOTE — Patient Instructions (Signed)
[facility] Discharge Instructions for Patients Receiving Chemotherapy  Today you received the following chemotherapy agents Alimta  To help prevent nausea and vomiting after your treatment, we encourage you to take your nausea medication as per DR. MOhamed. Take ativan as prescribed as needed for nausea.   If you develop nausea and vomiting that is not controlled by your nausea medication, call the clinic.   BELOW ARE SYMPTOMS THAT SHOULD BE REPORTED IMMEDIATELY:  *FEVER GREATER THAN 100.5 F  *CHILLS WITH OR WITHOUT FEVER  NAUSEA AND VOMITING THAT IS NOT CONTROLLED WITH YOUR NAUSEA MEDICATION  *UNUSUAL SHORTNESS OF BREATH  *UNUSUAL BRUISING OR BLEEDING  TENDERNESS IN MOUTH AND THROAT WITH OR WITHOUT PRESENCE OF ULCERS  *URINARY PROBLEMS  *BOWEL PROBLEMS  UNUSUAL RASH Items with * indicate a potential emergency and should be followed up as soon as possible.  Feel free to call the clinic you have any questions or concerns. The clinic phone number is [phone].    

## 2013-09-01 NOTE — Telephone Encounter (Signed)
appts made and printed. Pt is aware that tx will be added. i emailed MW to add the tx...td 

## 2013-09-01 NOTE — Progress Notes (Signed)
Chaplain made follow-up visit with patient. Pt was here for her monthly chemo treatment. She stated that it takes her about one week to recover from treatment. Pt also talked about a close friend of hers who recently lost her husband to cancer. Tamara Ortiz stated that she shared some resources on grief with her friend. Chaplain reflected that pt has been strong resource to her friend throughout this process. Pt asked about the continuation of the [facility] support group. Chaplain stated that team was working on having a continuing support group with the members of [facility]. Pt stated it had been a very good group and that she had felt comfortable sharing with the other members. Chaplain will follow up as necessary.

## 2013-09-02 ENCOUNTER — Encounter: Payer: Self-pay | Admitting: Physician Assistant

## 2013-09-02 ENCOUNTER — Telehealth: Payer: Self-pay | Admitting: Physician Assistant

## 2013-09-02 NOTE — Telephone Encounter (Signed)
Nurse Lamonte Sakai came to desk wanting apponitments moved for 12/29 per email she had received from pt...was abl eto rs lab and add OV before tx on this date.Marland KitchenMarland KitchenRosalind  to communicate this with pt. shh

## 2013-09-03 NOTE — Patient Instructions (Signed)
Followup in 3 weeks prior to your next scheduled cycle of chemotherapy. We are changing the interval your request so that you may enjoy your New Year's Eve

## 2013-09-09 ENCOUNTER — Encounter: Payer: Self-pay | Admitting: Physician Assistant

## 2013-09-22 ENCOUNTER — Ambulatory Visit: Payer: Medicare Other | Admitting: Physician Assistant

## 2013-09-22 ENCOUNTER — Other Ambulatory Visit: Payer: Medicare Other | Admitting: Lab

## 2013-09-22 ENCOUNTER — Ambulatory Visit: Payer: Medicare Other

## 2013-09-29 ENCOUNTER — Encounter: Payer: Self-pay | Admitting: Physician Assistant

## 2013-09-29 ENCOUNTER — Ambulatory Visit (HOSPITAL_BASED_OUTPATIENT_CLINIC_OR_DEPARTMENT_OTHER): Payer: Medicare Other | Admitting: Physician Assistant

## 2013-09-29 ENCOUNTER — Other Ambulatory Visit: Payer: Medicare Other | Admitting: Lab

## 2013-09-29 ENCOUNTER — Telehealth: Payer: Self-pay | Admitting: *Deleted

## 2013-09-29 ENCOUNTER — Ambulatory Visit (HOSPITAL_BASED_OUTPATIENT_CLINIC_OR_DEPARTMENT_OTHER): Payer: Medicare Other

## 2013-09-29 ENCOUNTER — Other Ambulatory Visit (HOSPITAL_BASED_OUTPATIENT_CLINIC_OR_DEPARTMENT_OTHER): Payer: Medicare Other

## 2013-09-29 VITALS — BP 122/76 | HR 58 | Temp 97.7°F | Resp 20 | Ht 65.0 in | Wt 142.1 lb

## 2013-09-29 DIAGNOSIS — C7951 Secondary malignant neoplasm of bone: Secondary | ICD-10-CM

## 2013-09-29 DIAGNOSIS — C3410 Malignant neoplasm of upper lobe, unspecified bronchus or lung: Secondary | ICD-10-CM

## 2013-09-29 DIAGNOSIS — C3490 Malignant neoplasm of unspecified part of unspecified bronchus or lung: Secondary | ICD-10-CM

## 2013-09-29 DIAGNOSIS — Z5111 Encounter for antineoplastic chemotherapy: Secondary | ICD-10-CM

## 2013-09-29 LAB — CBC WITH DIFFERENTIAL/PLATELET
Basophils Absolute: 0 10*3/uL (ref 0.0–0.1)
Eosinophils Absolute: 0 10*3/uL (ref 0.0–0.5)
HCT: 38.4 % (ref 34.8–46.6)
HGB: 13 g/dL (ref 11.6–15.9)
LYMPH%: 39.2 % (ref 14.0–49.7)
MONO#: 0.4 10*3/uL (ref 0.1–0.9)
NEUT#: 1.8 10*3/uL (ref 1.5–6.5)
NEUT%: 48.1 % (ref 38.4–76.8)
Platelets: 267 10*3/uL (ref 145–400)
WBC: 3.8 10*3/uL — ABNORMAL LOW (ref 3.9–10.3)

## 2013-09-29 LAB — COMPREHENSIVE METABOLIC PANEL (CC13)
ALT: 17 U/L (ref 0–55)
Anion Gap: 7 mEq/L (ref 3–11)
BUN: 14.4 mg/dL (ref 7.0–26.0)
CO2: 29 mEq/L (ref 22–29)
Calcium: 9.5 mg/dL (ref 8.4–10.4)
Chloride: 103 mEq/L (ref 98–109)
Creatinine: 0.8 mg/dL (ref 0.6–1.1)
Glucose: 135 mg/dl (ref 70–140)

## 2013-09-29 MED ORDER — SODIUM CHLORIDE 0.9 % IV SOLN
500.0000 mg/m2 | Freq: Once | INTRAVENOUS | Status: AC
  Administered 2013-09-29: 875 mg via INTRAVENOUS
  Filled 2013-09-29: qty 35

## 2013-09-29 MED ORDER — ZOLEDRONIC ACID 4 MG/100ML IV SOLN
4.0000 mg | Freq: Once | INTRAVENOUS | Status: AC
  Administered 2013-09-29: 4 mg via INTRAVENOUS
  Filled 2013-09-29: qty 100

## 2013-09-29 MED ORDER — SODIUM CHLORIDE 0.9 % IV SOLN
Freq: Once | INTRAVENOUS | Status: AC
  Administered 2013-09-29: 10:00:00 via INTRAVENOUS

## 2013-09-29 MED ORDER — ONDANSETRON 8 MG/50ML IVPB (CHCC)
8.0000 mg | Freq: Once | INTRAVENOUS | Status: AC
  Administered 2013-09-29: 8 mg via INTRAVENOUS

## 2013-09-29 MED ORDER — DEXAMETHASONE SODIUM PHOSPHATE 10 MG/ML IJ SOLN
10.0000 mg | Freq: Once | INTRAMUSCULAR | Status: AC
  Administered 2013-09-29: 10 mg via INTRAVENOUS

## 2013-09-29 MED ORDER — ONDANSETRON 8 MG/NS 50 ML IVPB
INTRAVENOUS | Status: AC
  Filled 2013-09-29: qty 8

## 2013-09-29 MED ORDER — MAGIC MOUTHWASH: 5.0000 mL | Freq: Four times a day (QID) | ORAL | Status: DC

## 2013-09-29 MED ORDER — DEXAMETHASONE SODIUM PHOSPHATE 10 MG/ML IJ SOLN
INTRAMUSCULAR | Status: AC
  Filled 2013-09-29: qty 1

## 2013-09-29 NOTE — Patient Instructions (Signed)
Continue with labs and chemotherapy as scheduled Follow with Dr. Arbutus Ped in 4 weeks with restaging CT scan of the chest, abdomen and pelvis to reevaluate her disease.

## 2013-09-29 NOTE — Telephone Encounter (Signed)
appts made and printed. Pt is aware that tx will be added. i emailed MW to add the tx's. Pt is aware that cs will call w/ appts for CT ABD, PELVIS, & CHEST appts...td

## 2013-09-29 NOTE — Patient Instructions (Signed)
[facility] Discharge Instructions for Patients Receiving Chemotherapy  Today you received the following chemotherapy agents: Alimta, Zometa  To help prevent nausea and vomiting after your treatment, we encourage you to take your nausea medication as prescribed.   If you develop nausea and vomiting that is not controlled by your nausea medication, call the clinic.   BELOW ARE SYMPTOMS THAT SHOULD BE REPORTED IMMEDIATELY:  *FEVER GREATER THAN 100.5 F  *CHILLS WITH OR WITHOUT FEVER  NAUSEA AND VOMITING THAT IS NOT CONTROLLED WITH YOUR NAUSEA MEDICATION  *UNUSUAL SHORTNESS OF BREATH  *UNUSUAL BRUISING OR BLEEDING  TENDERNESS IN MOUTH AND THROAT WITH OR WITHOUT PRESENCE OF ULCERS  *URINARY PROBLEMS  *BOWEL PROBLEMS  UNUSUAL RASH Items with * indicate a potential emergency and should be followed up as soon as possible.  Feel free to call the clinic you have any questions or concerns. The clinic phone number is [phone].

## 2013-09-29 NOTE — Telephone Encounter (Signed)
Per staff message and POF I have scheduled appts.  [name]  

## 2013-09-29 NOTE — Progress Notes (Addendum)
[facility] [phone]   [phone]  SHARED VISIT PROGRESS NOTE  [name], MD 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   PRIOR THERAPY:  1. Status post left upper lobectomy with mediastinal lymph node dissection under the care of Dr. Dorris Fetch on December 10, 2007.  2. Status post 4 cycles of adjuvant chemotherapy with cisplatin and Taxotere. Last dose was given 03/22/2008.  3. Status post 6 cycles of systemic chemotherapy with carboplatin and Alimta for metastatic disease in the bone. The last dose was given July 02, 2010, with stable disease.  4. Status post 12 cycles of maintenance Alimta at 500 mg/sq m given every 3 weeks. The last dose was given 05/08/2011.  CURRENT THERAPY:  1. Maintenance Alimta at 500 mg/sq m given every 4 weeks. The patient is status post 27 cycles.  2. Zometa 4 mg IV given every 2 months for bone metastasis.   CHEMOTHERAPY INTENT: Palliative  CURRENT # OF CHEMOTHERAPY CYCLES: 28 CURRENT ANTIEMETICS: none  CURRENT SMOKING STATUS: Never smoker   ORAL CHEMOTHERAPY AND CONSENT: n/a  CURRENT BISPHOSPHONATES USE: Yes, Zometa given every 2 months  PAIN MANAGEMENT: Norco  NARCOTICS INDUCED CONSTIPATION: Occasional, uses MiraLax  LIVING WILL AND CODE STATUS: Has a living will   INTERVAL HISTORY: Tamara Ortiz 72 y.o. female returns to the clinic today for followup visit. The patient is feeling fine today with no specific complaints, except for tingling in her hands. She states there was a problem with her magic mouthwash prescription and requests we contact her pharmacy for clarification. Her mouth has gotten a bit better but she still has a tendency to get a few ulcerations and raw spots. SShe  tolerated the last cycle of her systemic chemotherapy fairly well with no significant adverse effects. She denied having any significant chest pain, shortness of breath, cough or hemoptysis. She has no nausea or vomiting. She has no fever or chills. She has no significant weight loss or night sweats.   MEDICAL HISTORY: Past Medical History  Diagnosis Date  . Hypertension   . HYPERLIPIDEMIA 11/05/2007  . ANXIETY 05/13/2009  . DEPRESSIVE DISORDER 05/06/2008  . HYPERTENSION 11/05/2007  . ALLERGIC RHINITIS 11/05/2007  . CONSTIPATION, CHRONIC 05/13/2009  . UTI 05/06/2008  . BACK PAIN 05/11/2008  . OSTEOPOROSIS 11/05/2007  . INSOMNIA-SLEEP DISORDER-UNSPEC 05/13/2009  . FATIGUE 05/13/2009  . Swelling, mass, or lump in chest 11/05/2007  . LUNG CANCER, HX OF 05/06/2008  . lung ca w/ bone mets dx'd 11/2007    lt hip and spine    ALLERGIES:  is allergic to amoxicillin.  MEDICATIONS:  Current Outpatient Prescriptions  Medication Sig Dispense Refill  . aspirin 81 MG EC tablet Take 81 mg by mouth daily.        . Calcium Carbonate-Vitamin D (CALCIUM-VITAMIN D) 500-200 MG-UNIT per tablet Take 1 tablet by mouth 2 (two) times daily.        . Flaxseed, Linseed, (FLAXSEED OIL) 1000 MG CAPS Take 1,000 mg by mouth daily.        . folic acid (FOLVITE) 1 MG tablet TAKE 1 TABLET BY MOUTH EVERY DAY  90 tablet  1  . HYDROcodone-acetaminophen (NORCO) 5-325 MG per tablet Take 1 tablet by mouth every 6 (six) hours as  needed.  30 tablet  0  . LORazepam (ATIVAN) 0.5 MG tablet TAKE 1 TABLET BY MOUTH EVERY 8 HOURS AS NEEDED FOR ANXIETY  40 tablet  1  . losartan-hydrochlorothiazide (HYZAAR) 50-12.5 MG per tablet TAKE 1 TABLET EVERY DAY  90 tablet  2  . Multiple Vitamin (ANTIOXIDANT A/C/E PO) Take by mouth daily.        . Omega-3 350 MG CAPS Take by mouth daily.        . polyethylene glycol powder (MIRALAX) powder Take 17 g by mouth daily.        . Probiotic Product (PROBIOTIC FORMULA) CAPS Take 1 capsule by mouth daily.        .  simvastatin (ZOCOR) 40 MG tablet Take 1 tablet (40 mg total) by mouth daily.  90 tablet  3  . vitamin B-12 (CYANOCOBALAMIN) 100 MCG tablet Take 50 mcg by mouth daily.        . vitamin E ([employer] VITAMIN E) 400 UNIT capsule Take 400 Units by mouth daily.        [name] zolendronic acid (ZOMETA) 4 MG/5ML injection Inject 4 mg into the vein once.        [name] zolpidem (AMBIEN CR) 12.5 MG CR tablet Take 1 tablet (12.5 mg total) by mouth at bedtime as needed for sleep.  90 tablet  1  . Alum & Mag Hydroxide-Simeth (MAGIC MOUTHWASH) SOLN Take 5 mLs by mouth 4 (four) times daily. Swish and spit  240 mL  0   No current facility-administered medications for this visit.    SURGICAL HISTORY:  Past Surgical History  Procedure Laterality Date  . Tubal ligation    . S/p lul lung surgury  12/2007  . Lobectomy      REVIEW OF SYSTEMS:  A comprehensive review of systems was negative except for: Ears, nose, mouth, throat, and face: positive for sore mouth   PHYSICAL EXAMINATION: General appearance: alert, cooperative and no distress Head: Normocephalic, without obvious abnormality, atraumatic Neck: no adenopathy Lymph nodes: Cervical, supraclavicular, and axillary nodes normal. Resp: clear to auscultation bilaterally Cardio: regular rate and rhythm, S1, S2 normal, no murmur, click, rub or gallop GI: soft, non-tender; bowel sounds normal; no masses,  no organomegaly Extremities: extremities normal, atraumatic, no cyanosis or edema Mouth: Reveals a few mild ulcerations in the buccal mucosa right more so than the left, decreased from last office visit  ECOG PERFORMANCE STATUS: 0 - Asymptomatic  Blood pressure 122/76, pulse 58, temperature 97.7 F (36.5 C), temperature source Oral, resp. rate 20, height 5\' 5"  (1.651 m), weight 142 lb 1.6 oz (64.456 kg).  LABORATORY DATA: Lab Results  Component Value Date   WBC 3.8* 09/29/2013   HGB 13.0 09/29/2013   HCT 38.4 09/29/2013   MCV 95.8 09/29/2013   PLT 267 09/29/2013       Chemistry      Component Value Date/Time   NA 139 09/29/2013 0912   NA 135 10/08/2012 1459   NA 140 04/17/2012 0855   K 3.7 09/29/2013 0912   K 3.7 10/08/2012 1459   K 4.1 04/17/2012 0855   CL 100 03/04/2013 0901   CL 96 10/08/2012 1459   CL 99 04/17/2012 0855   CO2 29 09/29/2013 0912   CO2 29 10/08/2012 1459   CO2 29 04/17/2012 0855   BUN 14.4 09/29/2013 0912   BUN 14 10/08/2012 1459   BUN 15 04/17/2012 0855   CREATININE 0.8 09/29/2013 0912   CREATININE 0.96 10/08/2012 1459  CREATININE 1.4* 04/17/2012 0855      Component Value Date/Time   CALCIUM 9.5 09/29/2013 0912   CALCIUM 9.9 10/08/2012 1459   CALCIUM 9.5 04/17/2012 0855   ALKPHOS 52 09/29/2013 0912   ALKPHOS 74 10/08/2012 1459   ALKPHOS 68 04/17/2012 0855   AST 18 09/29/2013 0912   AST 22 10/08/2012 1459   AST 29 04/17/2012 0855   ALT 17 09/29/2013 0912   ALT 17 10/08/2012 1459   ALT 31 04/17/2012 0855   BILITOT 0.39 09/29/2013 0912   BILITOT 0.2* 10/08/2012 1459   BILITOT 0.60 04/17/2012 0855       RADIOGRAPHIC STUDIES: Ct Chest W Contrast  07/28/2013   CLINICAL DATA:  Lung cancer with resection ongoing chemotherapy.  EXAM: CT CHEST, ABDOMEN, AND PELVIS WITH CONTRAST  TECHNIQUE: Multidetector CT imaging of the chest, abdomen and pelvis was performed following the standard protocol during bolus administration of intravenous contrast.  CONTRAST:  OMNIPAQUE IOHEXOL 300 MG/ML  SOLN  COMPARISON:  04/01/2013.  FINDINGS:   CT CHEST FINDINGS  No pathologically enlarged mediastinal, hilar or axillary lymph nodes. Atherosclerotic calcification of the arterial vasculature, including coronary arteries. Heart is at the upper limits of normal in size. No pericardial effusion. Tiny hiatal hernia.  Postoperative changes of left upper lobectomy. 3 mm apical segment right upper lobe nodule (image 13) is unchanged. No pleural fluid. Airway is otherwise unremarkable.    CT ABDOMEN AND PELVIS FINDINGS  Liver, gallbladder, adrenal glands, kidneys,  spleen, pancreas, stomach and small bowel are unremarkable. A fair amount of stool is seen in the colon, suggestive of constipation.  Uterus and ovaries are visualized. Scattered atherosclerotic calcification of the arterial vasculature without abdominal aortic aneurysm. No pathologically enlarged lymph nodes. No free fluid.  Scattered sclerotic lesions in the spine and pelvis appear unchanged.  IMPRESSION: CT CHEST IMPRESSION  1. Postoperative changes of left upper lobectomy without evidence of recurrent disease. 2. Coronary artery calcification.  CT ABDOMEN AND PELVIS IMPRESSION  1. No evidence of metastatic disease in the abdomen or pelvis. 2. Stable osseous metastatic disease.   Electronically Signed   By: Leanna Battles M.D.   On: 07/28/2013 14:29     ASSESSMENT AND PLAN: This is a very pleasant 72 years old white female with recurrent non-small cell lung cancer, adenocarcinoma. The patient is currently on maintenance Alimta monthly status post total of 27 cycles. Her last CT scan of the chest, abdomen and pelvis showed no evidence for disease progression. She is tolerating her treatment fairly well with no significant adverse effects. Patient was discussed with an also seen by Dr. Arbutus Ped. She will proceed with cycle #28 today as scheduled. For the mouth ulcers, a prescription for Magic mouthwash was called to her pharmacy of record. Patient was in agreement with this plan and she will return in 4 weeks prior to cycle #29 of her maintenance chemotherapy with single agent Alimta.  When she returns in 4 weeks she'll have a restaging CT scan of the chest, abdomen and pelvis with contrast to reevaluate her disease in Dr. Arbutus Ped will discuss the results with her. The patient was advised to call immediately if she has any concerning symptoms in the interval.  The patient voices understanding of current disease status and treatment options and is in agreement with the current care plan.  All questions were  answered. The patient knows to call the clinic with any problems, questions or concerns. We can certainly see the patient much sooner if  necessary. .  ADDENDUM: Hematology/Oncology Attending: I had a face to face encounter with the patient. I recommended her care plan. This is a very pleasant 72 years old white female with metastatic non-small cell lung cancer, adenocarcinoma currently undergoing maintenance chemotherapy with single agent Alimta status post 27 cycles and tolerating her treatment fairly well.. We will proceed today with cycle #28. She would come back for followup visit in one month with repeat CT scan of the chest, abdomen and pelvis for restaging of her disease. She was advised to call immediately if she has any concerning symptoms in the interval. Lajuana Matte., MD 09/30/2013

## 2013-10-09 ENCOUNTER — Other Ambulatory Visit: Payer: Self-pay | Admitting: Internal Medicine

## 2013-10-09 DIAGNOSIS — Z1231 Encounter for screening mammogram for malignant neoplasm of breast: Secondary | ICD-10-CM

## 2013-10-16 ENCOUNTER — Encounter: Payer: Self-pay | Admitting: Internal Medicine

## 2013-10-16 MED ORDER — ZOLPIDEM TARTRATE ER 12.5 MG PO TBCR: 12.5000 mg | EXTENDED_RELEASE_TABLET | Freq: Every evening | ORAL | Status: DC | PRN

## 2013-10-16 NOTE — Telephone Encounter (Signed)
Done hardcopy to robin  See above message

## 2013-10-17 NOTE — Telephone Encounter (Signed)
Called the patient and faxed hardcopy to new pharmacy [facility] [place] [address].

## 2013-10-24 ENCOUNTER — Ambulatory Visit (HOSPITAL_COMMUNITY)
Admission: RE | Admit: 2013-10-24 | Discharge: 2013-10-24 | Disposition: A | Discharge status: Home / Self Care | Payer: Medicare Other | Source: Ambulatory Visit | Attending: Physician Assistant | Admitting: Physician Assistant

## 2013-10-24 ENCOUNTER — Encounter: Payer: Self-pay | Admitting: Pharmacist

## 2013-10-24 ENCOUNTER — Other Ambulatory Visit (HOSPITAL_BASED_OUTPATIENT_CLINIC_OR_DEPARTMENT_OTHER): Payer: Medicare Other

## 2013-10-24 ENCOUNTER — Encounter (HOSPITAL_COMMUNITY): Payer: Self-pay

## 2013-10-24 DIAGNOSIS — Z902 Acquired absence of lung [part of]: Secondary | ICD-10-CM | POA: Insufficient documentation

## 2013-10-24 DIAGNOSIS — C7952 Secondary malignant neoplasm of bone marrow: Secondary | ICD-10-CM

## 2013-10-24 DIAGNOSIS — R911 Solitary pulmonary nodule: Secondary | ICD-10-CM | POA: Insufficient documentation

## 2013-10-24 DIAGNOSIS — C3490 Malignant neoplasm of unspecified part of unspecified bronchus or lung: Secondary | ICD-10-CM | POA: Insufficient documentation

## 2013-10-24 DIAGNOSIS — C3410 Malignant neoplasm of upper lobe, unspecified bronchus or lung: Secondary | ICD-10-CM

## 2013-10-24 DIAGNOSIS — C7951 Secondary malignant neoplasm of bone: Secondary | ICD-10-CM

## 2013-10-24 DIAGNOSIS — J984 Other disorders of lung: Secondary | ICD-10-CM | POA: Insufficient documentation

## 2013-10-24 DIAGNOSIS — I517 Cardiomegaly: Secondary | ICD-10-CM | POA: Insufficient documentation

## 2013-10-24 LAB — CBC WITH DIFFERENTIAL/PLATELET
BASO%: 1 % (ref 0.0–2.0)
Basophils Absolute: 0 10*3/uL (ref 0.0–0.1)
EOS ABS: 0 10*3/uL (ref 0.0–0.5)
EOS%: 1.1 % (ref 0.0–7.0)
HCT: 40 % (ref 34.8–46.6)
HGB: 13.6 g/dL (ref 11.6–15.9)
LYMPH%: 31.9 % (ref 14.0–49.7)
MCH: 33 pg (ref 25.1–34.0)
MCHC: 33.9 g/dL (ref 31.5–36.0)
MCV: 97.3 fL (ref 79.5–101.0)
MONO#: 0.4 10*3/uL (ref 0.1–0.9)
MONO%: 10.5 % (ref 0.0–14.0)
NEUT#: 2.3 10*3/uL (ref 1.5–6.5)
NEUT%: 55.5 % (ref 38.4–76.8)
PLATELETS: 268 10*3/uL (ref 145–400)
RBC: 4.11 10*6/uL (ref 3.70–5.45)
RDW: 12.9 % (ref 11.2–14.5)
WBC: 4.1 10*3/uL (ref 3.9–10.3)
lymph#: 1.3 10*3/uL (ref 0.9–3.3)

## 2013-10-24 LAB — COMPREHENSIVE METABOLIC PANEL (CC13)
ALK PHOS: 62 U/L (ref 40–150)
ALT: 22 U/L (ref 0–55)
AST: 24 U/L (ref 5–34)
Albumin: 4 g/dL (ref 3.5–5.0)
Anion Gap: 10 mEq/L (ref 3–11)
BILIRUBIN TOTAL: 0.45 mg/dL (ref 0.20–1.20)
BUN: 14.1 mg/dL (ref 7.0–26.0)
CHLORIDE: 99 meq/L (ref 98–109)
CO2: 31 mEq/L — ABNORMAL HIGH (ref 22–29)
CREATININE: 1 mg/dL (ref 0.6–1.1)
Calcium: 10.3 mg/dL (ref 8.4–10.4)
Glucose: 89 mg/dl (ref 70–140)
Potassium: 4 mEq/L (ref 3.5–5.1)
Sodium: 140 mEq/L (ref 136–145)
Total Protein: 7.1 g/dL (ref 6.4–8.3)

## 2013-10-24 MED ORDER — IOHEXOL 300 MG/ML  SOLN
100.0000 mL | Freq: Once | INTRAMUSCULAR | Status: AC | PRN
  Administered 2013-10-24: 100 mL via INTRAVENOUS

## 2013-10-27 ENCOUNTER — Ambulatory Visit (HOSPITAL_BASED_OUTPATIENT_CLINIC_OR_DEPARTMENT_OTHER): Payer: Medicare Other | Admitting: Internal Medicine

## 2013-10-27 ENCOUNTER — Other Ambulatory Visit: Payer: Medicare Other

## 2013-10-27 ENCOUNTER — Telehealth: Payer: Self-pay | Admitting: *Deleted

## 2013-10-27 ENCOUNTER — Ambulatory Visit (HOSPITAL_BASED_OUTPATIENT_CLINIC_OR_DEPARTMENT_OTHER): Payer: Medicare Other

## 2013-10-27 ENCOUNTER — Telehealth: Payer: Self-pay | Admitting: Internal Medicine

## 2013-10-27 ENCOUNTER — Encounter: Payer: Self-pay | Admitting: Internal Medicine

## 2013-10-27 VITALS — BP 116/69 | HR 57 | Temp 97.6°F | Resp 18 | Ht 65.0 in | Wt 141.7 lb

## 2013-10-27 DIAGNOSIS — C3410 Malignant neoplasm of upper lobe, unspecified bronchus or lung: Secondary | ICD-10-CM

## 2013-10-27 DIAGNOSIS — C3490 Malignant neoplasm of unspecified part of unspecified bronchus or lung: Secondary | ICD-10-CM

## 2013-10-27 DIAGNOSIS — Z5111 Encounter for antineoplastic chemotherapy: Secondary | ICD-10-CM

## 2013-10-27 DIAGNOSIS — C7952 Secondary malignant neoplasm of bone marrow: Secondary | ICD-10-CM

## 2013-10-27 DIAGNOSIS — C7951 Secondary malignant neoplasm of bone: Secondary | ICD-10-CM

## 2013-10-27 MED ORDER — SODIUM CHLORIDE 0.9 % IV SOLN
500.0000 mg/m2 | Freq: Once | INTRAVENOUS | Status: AC
  Administered 2013-10-27: 875 mg via INTRAVENOUS
  Filled 2013-10-27: qty 35

## 2013-10-27 MED ORDER — DEXAMETHASONE SODIUM PHOSPHATE 10 MG/ML IJ SOLN
10.0000 mg | Freq: Once | INTRAMUSCULAR | Status: AC
  Administered 2013-10-27: 10 mg via INTRAVENOUS

## 2013-10-27 MED ORDER — DEXAMETHASONE SODIUM PHOSPHATE 10 MG/ML IJ SOLN
INTRAMUSCULAR | Status: AC
  Filled 2013-10-27: qty 1

## 2013-10-27 MED ORDER — SODIUM CHLORIDE 0.9 % IV SOLN
Freq: Once | INTRAVENOUS | Status: AC
  Administered 2013-10-27: 11:00:00 via INTRAVENOUS

## 2013-10-27 MED ORDER — ONDANSETRON 8 MG/50ML IVPB (CHCC)
8.0000 mg | Freq: Once | INTRAVENOUS | Status: AC
  Administered 2013-10-27: 8 mg via INTRAVENOUS

## 2013-10-27 MED ORDER — ONDANSETRON 8 MG/NS 50 ML IVPB
INTRAVENOUS | Status: AC
  Filled 2013-10-27: qty 8

## 2013-10-27 MED ORDER — CYANOCOBALAMIN 1000 MCG/ML IJ SOLN
1000.0000 ug | Freq: Once | INTRAMUSCULAR | Status: AC
  Administered 2013-10-27: 1000 ug via INTRAMUSCULAR

## 2013-10-27 MED ORDER — CYANOCOBALAMIN 1000 MCG/ML IJ SOLN
INTRAMUSCULAR | Status: AC
  Filled 2013-10-27: qty 1

## 2013-10-27 NOTE — Patient Instructions (Signed)
Continue chemotherapy today as scheduled.  Followup visit in one month's with the next cycle of treatment.

## 2013-10-27 NOTE — Patient Instructions (Signed)
[facility] Discharge Instructions for Patients Receiving Chemotherapy  Today you received the following chemotherapy agents Alimta  To help prevent nausea and vomiting after your treatment, we encourage you to take your nausea medication as per DR. MOhamed. Take ativan as prescribed as needed for nausea.   If you develop nausea and vomiting that is not controlled by your nausea medication, call the clinic.   BELOW ARE SYMPTOMS THAT SHOULD BE REPORTED IMMEDIATELY:  *FEVER GREATER THAN 100.5 F  *CHILLS WITH OR WITHOUT FEVER  NAUSEA AND VOMITING THAT IS NOT CONTROLLED WITH YOUR NAUSEA MEDICATION  *UNUSUAL SHORTNESS OF BREATH  *UNUSUAL BRUISING OR BLEEDING  TENDERNESS IN MOUTH AND THROAT WITH OR WITHOUT PRESENCE OF ULCERS  *URINARY PROBLEMS  *BOWEL PROBLEMS  UNUSUAL RASH Items with * indicate a potential emergency and should be followed up as soon as possible.  Feel free to call the clinic you have any questions or concerns. The clinic phone number is [phone].

## 2013-10-27 NOTE — Telephone Encounter (Signed)
Gave pt appt for lab,md and chemo for [facility] and March 2015

## 2013-10-27 NOTE — Progress Notes (Signed)
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   PRIOR THERAPY:  1. Status post left upper lobectomy with mediastinal lymph node dissection under the care of Dr. Roxan Hockey on December 10, 2007.  2. Status post 4 cycles of adjuvant chemotherapy with cisplatin and Taxotere. Last dose was given 03/22/2008.  3. Status post 6 cycles of systemic chemotherapy with carboplatin and Alimta for metastatic disease in the bone. The last dose was given July 02, 2010, with stable disease.  4. Status post 12 cycles of maintenance Alimta at 500 mg/sq m given every 3 weeks. The last dose was given 05/08/2011.  CURRENT THERAPY:  1. Maintenance Alimta at 500 mg/sq m given every 4 weeks. The patient is status post 28 cycles.  2. Zometa 4 mg IV given every 2 months for bone metastasis.   CHEMOTHERAPY INTENT: Palliative  CURRENT # OF CHEMOTHERAPY CYCLES: 29 CURRENT ANTIEMETICS: none  CURRENT SMOKING STATUS: Never smoker   ORAL CHEMOTHERAPY AND CONSENT: n/a  CURRENT BISPHOSPHONATES USE: Yes, Zometa given every 2 months  PAIN MANAGEMENT: Norco  NARCOTICS INDUCED CONSTIPATION: Occasional, uses MiraLax  LIVING WILL AND CODE STATUS: Has a living will   INTERVAL HISTORY: Tamara Ortiz 73 y.o. female returns to the clinic today for followup visit. The patient is feeling fine today with no specific complaints, except for mild low back pain and arthralgias. She tolerated the last cycle of her systemic chemotherapy fairly well with no significant adverse effects. She denied having any significant chest pain, shortness of breath, cough or hemoptysis. She has no nausea or vomiting. She has  no fever or chills. She has no significant weight loss or night sweats. The patient had repeat CT scan of the chest, abdomen and pelvis performed recently and she is here for evaluation and discussion of her scan results.  MEDICAL HISTORY: Past Medical History  Diagnosis Date  . Hypertension   . HYPERLIPIDEMIA 11/05/2007  . ANXIETY 05/13/2009  . DEPRESSIVE DISORDER 05/06/2008  . HYPERTENSION 11/05/2007  . ALLERGIC RHINITIS 11/05/2007  . CONSTIPATION, CHRONIC 05/13/2009  . UTI 05/06/2008  . BACK PAIN 05/11/2008  . OSTEOPOROSIS 11/05/2007  . INSOMNIA-SLEEP DISORDER-UNSPEC 05/13/2009  . FATIGUE 05/13/2009  . Swelling, mass, or lump in chest 11/05/2007  . LUNG CANCER, HX OF 05/06/2008  . lung ca w/ bone mets dx'd 11/2007    lt hip and spine    ALLERGIES:  is allergic to amoxicillin.  MEDICATIONS:  Current Outpatient Prescriptions  Medication Sig Dispense Refill  . Alum & Mag Hydroxide-Simeth (MAGIC MOUTHWASH) SOLN Take 5 mLs by mouth 4 (four) times daily. Swish and spit  240 mL  0  . aspirin 81 MG EC tablet Take 81 mg by mouth daily.        . Calcium Carbonate-Vitamin D (CALCIUM-VITAMIN D) 500-200 MG-UNIT per tablet Take 1 tablet by mouth 2 (two) times daily.        . Flaxseed, Linseed, (FLAXSEED OIL) 1000 MG CAPS Take 1,000 mg by mouth daily.        . folic acid (FOLVITE) 1 MG tablet TAKE 1 TABLET BY MOUTH EVERY DAY  90 tablet  1  . HYDROcodone-acetaminophen (NORCO) 5-325 MG  per tablet Take 1 tablet by mouth every 6 (six) hours as needed.  30 tablet  0  . LORazepam (ATIVAN) 0.5 MG tablet TAKE 1 TABLET BY MOUTH EVERY 8 HOURS AS NEEDED FOR ANXIETY  40 tablet  1  . losartan-hydrochlorothiazide (HYZAAR) 50-12.5 MG per tablet TAKE 1 TABLET EVERY DAY  90 tablet  2  . Multiple Vitamin (ANTIOXIDANT A/C/E PO) Take by mouth daily.        . Omega-3 350 MG CAPS Take by mouth daily.        . polyethylene glycol powder (MIRALAX) powder Take 17 g by mouth daily.        . Probiotic Product (PROBIOTIC FORMULA) CAPS  Take 1 capsule by mouth daily.        . simvastatin (ZOCOR) 40 MG tablet Take 1 tablet (40 mg total) by mouth daily.  90 tablet  3  . vitamin B-12 (CYANOCOBALAMIN) 100 MCG tablet Take 50 mcg by mouth daily.        . vitamin E ([employer]S VITAMIN E) 400 UNIT capsule Take 400 Units by mouth daily.        [name] zolendronic acid (ZOMETA) 4 MG/5ML injection Inject 4 mg into the vein once.        [name] zolpidem (AMBIEN CR) 12.5 MG CR tablet Take 1 tablet (12.5 mg total) by mouth at bedtime as needed for sleep.  90 tablet  1   No current facility-administered medications for this visit.    SURGICAL HISTORY:  Past Surgical History  Procedure Laterality Date  . Tubal ligation    . S/p lul lung surgury  12/2007  . Lobectomy      REVIEW OF SYSTEMS:  Constitutional: negative Eyes: negative Ears, nose, mouth, throat, and face: negative Respiratory: negative Cardiovascular: negative Gastrointestinal: negative Genitourinary:negative Integument/breast: negative Hematologic/lymphatic: negative Musculoskeletal:positive for arthralgias and back pain Neurological: negative Behavioral/Psych: negative Endocrine: negative Allergic/Immunologic: negative   PHYSICAL EXAMINATION: General appearance: alert, cooperative and no distress Head: Normocephalic, without obvious abnormality, atraumatic Neck: no adenopathy Lymph nodes: Cervical, supraclavicular, and axillary nodes normal. Resp: clear to auscultation bilaterally Back: symmetric, no curvature. ROM normal. No CVA tenderness. Cardio: regular rate and rhythm, S1, S2 normal, no murmur, click, rub or gallop GI: soft, non-tender; bowel sounds normal; no masses,  no organomegaly Extremities: extremities normal, atraumatic, no cyanosis or edema Neurologic: Alert and oriented X 3, normal strength and tone. Normal symmetric reflexes. Normal coordination and gait  ECOG PERFORMANCE STATUS: 0 - Asymptomatic  Blood pressure 116/69, pulse 57, temperature 97.6 F (36.4 C),  temperature source Oral, resp. rate 18, height 5' 5" (1.651 m), weight 141 lb 11.2 oz (64.275 kg), SpO2 100.00%.  LABORATORY DATA: Lab Results  Component Value Date   WBC 4.1 10/24/2013   HGB 13.6 10/24/2013   HCT 40.0 10/24/2013   MCV 97.3 10/24/2013   PLT 268 10/24/2013      Chemistry      Component Value Date/Time   NA 140 10/24/2013 1023   NA 135 10/08/2012 1459   NA 140 04/17/2012 0855   K 4.0 10/24/2013 1023   K 3.7 10/08/2012 1459   K 4.1 04/17/2012 0855   CL 100 03/04/2013 0901   CL 96 10/08/2012 1459   CL 99 04/17/2012 0855   CO2 31* 10/24/2013 1023   CO2 29 10/08/2012 1459   CO2 29 04/17/2012 0855   BUN 14.1 10/24/2013 1023   BUN 14 10/08/2012 1459   BUN 15 04/17/2012 0855   CREATININE  1.0 10/24/2013 1023   CREATININE 0.96 10/08/2012 1459   CREATININE 1.4* 04/17/2012 0855      Component Value Date/Time   CALCIUM 10.3 10/24/2013 1023   CALCIUM 9.9 10/08/2012 1459   CALCIUM 9.5 04/17/2012 0855   ALKPHOS 62 10/24/2013 1023   ALKPHOS 74 10/08/2012 1459   ALKPHOS 68 04/17/2012 0855   AST 24 10/24/2013 1023   AST 22 10/08/2012 1459   AST 29 04/17/2012 0855   ALT 22 10/24/2013 1023   ALT 17 10/08/2012 1459   ALT 31 04/17/2012 0855   BILITOT 0.45 10/24/2013 1023   BILITOT 0.2* 10/08/2012 1459   BILITOT 0.60 04/17/2012 0855       RADIOGRAPHIC STUDIES: Ct Chest W Contrast  10/24/2013   CLINICAL DATA:  Lung cancer with bone metastases  EXAM: CT CHEST, ABDOMEN, AND PELVIS WITH CONTRAST  TECHNIQUE: Multidetector CT imaging of the chest, abdomen and pelvis was performed following the standard protocol during bolus administration of intravenous contrast.  CONTRAST:  110m OMNIPAQUE IOHEXOL 300 MG/ML  SOLN  COMPARISON:  07/28/2013.  FINDINGS:   CT CHEST FINDINGS  There is no evidence for axillary lymphadenopathy. No mediastinal or hilar lymphadenopathy. The heart is enlarged with evidence of coronary artery calcification. No pericardial or pleural effusion.  The patient is status post left upper lobectomy. The  right upper lobe 3 mm lung nodule seen on the previous study is almost an apparent on today's study but is visualized on image 14 of series 5. No other pulmonary parenchymal nodule or mass is evident. Some linear scarring in the left lung base is unchanged in the interval.    CT ABDOMEN AND PELVIS FINDINGS  No focal abnormality is seen in the liver or spleen. A small hiatal hernia is noted. Stomach otherwise unremarkable. The duodenum, pancreas, gallbladder, and adrenal glands have normal imaging features. No focal mass lesion is seen in either kidney.  No abdominal aortic aneurysm. There is no free fluid or lymphadenopathy in the abdomen  Imaging through the pelvis shows no free intraperitoneal fluid. There is no pelvic sidewall lymphadenopathy. Laminar flow on opacified blood is identified in the left common iliac vein.  Uterus is unremarkable. There is no adnexal mass. No colonic diverticulitis. The terminal ileum is normal. The appendix is not visualized, but there is no edema or inflammation in the region of the cecum. Mixed lytic and sclerotic lesion in the left iliac bone is stable. The sclerotic lesion in the right sacrum is stable. Degenerative facet disease is noted in the lower lumbar spine. No change in the sclerotic T7 lesion.    IMPRESSION: Stable chest CT. Status post left upper lobectomy without evidence for disease recurrence.  No evidence for metastatic disease in the soft tissues of the abdomen or pelvis.  Stable scattered bony lesions consistent with the reported metastatic disease.   Electronically Signed   By: EMisty StanleyM.D.   On: 10/24/2013 14:06    ASSESSMENT AND PLAN: This is a very pleasant 73years old white female with recurrent non-small cell lung cancer, adenocarcinoma. The patient is currently on maintenance Alimta monthly status post total of 28 cycles.  Her recent CT scan of the chest, abdomen and pelvis showed no evidence for disease progression. I discussed the scan  results with the patient today. I recommended for the patient to proceed with cycle #29 today as scheduled.  She would come back for followup visit in one month for evaluation before starting the next cycle of her  chemotherapy. The patient was advised to call immediately if she has any concerning symptoms in the interval.  The patient voices understanding of current disease status and treatment options and is in agreement with the current care plan.  All questions were answered. The patient knows to call the clinic with any problems, questions or concerns. We can certainly see the patient much sooner if necessary. I spent 15 minutes counseling the patient face to face. The total time spent in the appointment was 25 minutes.  Disclaimer: This note was dictated with voice recognition software. Similar sounding words can inadvertently be transcribed and may not be corrected upon review.

## 2013-10-27 NOTE — Telephone Encounter (Signed)
Per staff phone call and POF I have schedueld appts.  [name]  

## 2013-10-29 ENCOUNTER — Telehealth: Payer: Self-pay | Admitting: Internal Medicine

## 2013-10-29 NOTE — Telephone Encounter (Signed)
Mailed appt calendar to pt for February and [facility] 2015

## 2013-11-12 ENCOUNTER — Ambulatory Visit (HOSPITAL_COMMUNITY)
Admission: RE | Admit: 2013-11-12 | Discharge: 2013-11-12 | Disposition: A | Discharge status: Home / Self Care | Payer: Medicare Other | Source: Ambulatory Visit | Attending: Internal Medicine | Admitting: Internal Medicine

## 2013-11-12 DIAGNOSIS — Z1231 Encounter for screening mammogram for malignant neoplasm of breast: Secondary | ICD-10-CM | POA: Insufficient documentation

## 2013-11-12 LAB — HM MAMMOGRAPHY

## 2013-11-24 ENCOUNTER — Other Ambulatory Visit (HOSPITAL_BASED_OUTPATIENT_CLINIC_OR_DEPARTMENT_OTHER): Payer: Medicare Other

## 2013-11-24 ENCOUNTER — Other Ambulatory Visit: Payer: Medicare Other

## 2013-11-24 ENCOUNTER — Ambulatory Visit: Payer: Medicare Other | Admitting: Nutrition

## 2013-11-24 ENCOUNTER — Telehealth: Payer: Self-pay | Admitting: Internal Medicine

## 2013-11-24 ENCOUNTER — Ambulatory Visit (HOSPITAL_BASED_OUTPATIENT_CLINIC_OR_DEPARTMENT_OTHER): Payer: Medicare Other | Admitting: Physician Assistant

## 2013-11-24 ENCOUNTER — Telehealth: Payer: Self-pay | Admitting: *Deleted

## 2013-11-24 ENCOUNTER — Ambulatory Visit (HOSPITAL_BASED_OUTPATIENT_CLINIC_OR_DEPARTMENT_OTHER): Payer: Medicare Other

## 2013-11-24 ENCOUNTER — Encounter: Payer: Self-pay | Admitting: Physician Assistant

## 2013-11-24 VITALS — BP 107/64 | HR 62 | Temp 98.3°F | Resp 18 | Ht 65.0 in | Wt 142.3 lb

## 2013-11-24 DIAGNOSIS — C7952 Secondary malignant neoplasm of bone marrow: Secondary | ICD-10-CM

## 2013-11-24 DIAGNOSIS — Z5111 Encounter for antineoplastic chemotherapy: Secondary | ICD-10-CM

## 2013-11-24 DIAGNOSIS — C7951 Secondary malignant neoplasm of bone: Secondary | ICD-10-CM

## 2013-11-24 DIAGNOSIS — C3410 Malignant neoplasm of upper lobe, unspecified bronchus or lung: Secondary | ICD-10-CM

## 2013-11-24 DIAGNOSIS — C3490 Malignant neoplasm of unspecified part of unspecified bronchus or lung: Secondary | ICD-10-CM

## 2013-11-24 LAB — CBC WITH DIFFERENTIAL/PLATELET
BASO%: 1.1 % (ref 0.0–2.0)
Basophils Absolute: 0 10*3/uL (ref 0.0–0.1)
EOS%: 1.4 % (ref 0.0–7.0)
Eosinophils Absolute: 0.1 10*3/uL (ref 0.0–0.5)
HCT: 37.2 % (ref 34.8–46.6)
HGB: 12.5 g/dL (ref 11.6–15.9)
LYMPH%: 33.8 % (ref 14.0–49.7)
MCH: 32.1 pg (ref 25.1–34.0)
MCHC: 33.6 g/dL (ref 31.5–36.0)
MCV: 95.6 fL (ref 79.5–101.0)
MONO#: 0.5 10*3/uL (ref 0.1–0.9)
MONO%: 13.5 % (ref 0.0–14.0)
NEUT#: 1.9 10*3/uL (ref 1.5–6.5)
NEUT%: 50.2 % (ref 38.4–76.8)
PLATELETS: 230 10*3/uL (ref 145–400)
RBC: 3.89 10*6/uL (ref 3.70–5.45)
RDW: 13.2 % (ref 11.2–14.5)
WBC: 3.7 10*3/uL — ABNORMAL LOW (ref 3.9–10.3)
lymph#: 1.3 10*3/uL (ref 0.9–3.3)

## 2013-11-24 LAB — COMPREHENSIVE METABOLIC PANEL (CC13)
ALT: 18 U/L (ref 0–55)
ANION GAP: 10 meq/L (ref 3–11)
AST: 24 U/L (ref 5–34)
Albumin: 3.9 g/dL (ref 3.5–5.0)
Alkaline Phosphatase: 59 U/L (ref 40–150)
BUN: 15.4 mg/dL (ref 7.0–26.0)
CHLORIDE: 101 meq/L (ref 98–109)
CO2: 28 mEq/L (ref 22–29)
CREATININE: 1 mg/dL (ref 0.6–1.1)
Calcium: 10.4 mg/dL (ref 8.4–10.4)
GLUCOSE: 84 mg/dL (ref 70–140)
Potassium: 3.8 mEq/L (ref 3.5–5.1)
Sodium: 139 mEq/L (ref 136–145)
Total Bilirubin: 0.53 mg/dL (ref 0.20–1.20)
Total Protein: 7.2 g/dL (ref 6.4–8.3)

## 2013-11-24 MED ORDER — DEXAMETHASONE SODIUM PHOSPHATE 10 MG/ML IJ SOLN
10.0000 mg | Freq: Once | INTRAMUSCULAR | Status: AC
  Administered 2013-11-24: 10 mg via INTRAVENOUS

## 2013-11-24 MED ORDER — ZOLEDRONIC ACID 4 MG/100ML IV SOLN
4.0000 mg | Freq: Once | INTRAVENOUS | Status: AC
  Administered 2013-11-24: 4 mg via INTRAVENOUS
  Filled 2013-11-24: qty 100

## 2013-11-24 MED ORDER — LORAZEPAM 0.5 MG PO TABS: 0.5000 mg | ORAL_TABLET | Freq: Three times a day (TID) | ORAL | Status: DC | PRN

## 2013-11-24 MED ORDER — HEPARIN SOD (PORK) LOCK FLUSH 100 UNIT/ML IV SOLN
250.0000 [IU] | Freq: Once | INTRAVENOUS | Status: DC | PRN
  Filled 2013-11-24: qty 5

## 2013-11-24 MED ORDER — ONDANSETRON 8 MG/50ML IVPB (CHCC)
8.0000 mg | Freq: Once | INTRAVENOUS | Status: AC
  Administered 2013-11-24: 8 mg via INTRAVENOUS

## 2013-11-24 MED ORDER — SODIUM CHLORIDE 0.9 % IV SOLN
Freq: Once | INTRAVENOUS | Status: AC
  Administered 2013-11-24: 10:00:00 via INTRAVENOUS

## 2013-11-24 MED ORDER — SODIUM CHLORIDE 0.9 % IJ SOLN
10.0000 mL | INTRAMUSCULAR | Status: DC | PRN
  Filled 2013-11-24: qty 10

## 2013-11-24 MED ORDER — DEXAMETHASONE SODIUM PHOSPHATE 10 MG/ML IJ SOLN
INTRAMUSCULAR | Status: AC
  Filled 2013-11-24: qty 1

## 2013-11-24 MED ORDER — ONDANSETRON HCL 8 MG PO TABS: 8.0000 mg | ORAL_TABLET | Freq: Three times a day (TID) | ORAL | Status: DC | PRN

## 2013-11-24 MED ORDER — ONDANSETRON 8 MG/NS 50 ML IVPB
INTRAVENOUS | Status: AC
  Filled 2013-11-24: qty 8

## 2013-11-24 MED ORDER — SODIUM CHLORIDE 0.9 % IV SOLN
500.0000 mg/m2 | Freq: Once | INTRAVENOUS | Status: AC
  Administered 2013-11-24: 875 mg via INTRAVENOUS
  Filled 2013-11-24: qty 35

## 2013-11-24 MED ORDER — HEPARIN SOD (PORK) LOCK FLUSH 100 UNIT/ML IV SOLN
500.0000 [IU] | Freq: Once | INTRAVENOUS | Status: DC | PRN
  Filled 2013-11-24: qty 5

## 2013-11-24 MED ORDER — SODIUM CHLORIDE 0.9 % IJ SOLN
3.0000 mL | Freq: Once | INTRAMUSCULAR | Status: DC | PRN
  Filled 2013-11-24: qty 10

## 2013-11-24 MED ORDER — ALTEPLASE 2 MG IJ SOLR
2.0000 mg | Freq: Once | INTRAMUSCULAR | Status: DC | PRN
  Filled 2013-11-24: qty 2

## 2013-11-24 NOTE — Patient Instructions (Signed)
[facility] Discharge Instructions for Patients Receiving Chemotherapy  Today you received the following chemotherapy agents Alimta, Zometa  To help prevent nausea and vomiting after your treatment, we encourage you to take your nausea medication as directed/prescribed. If you develop nausea and vomiting that is not controlled by your nausea medication, call the clinic.   BELOW ARE SYMPTOMS THAT SHOULD BE REPORTED IMMEDIATELY:  *FEVER GREATER THAN 100.5 F  *CHILLS WITH OR WITHOUT FEVER  NAUSEA AND VOMITING THAT IS NOT CONTROLLED WITH YOUR NAUSEA MEDICATION  *UNUSUAL SHORTNESS OF BREATH  *UNUSUAL BRUISING OR BLEEDING  TENDERNESS IN MOUTH AND THROAT WITH OR WITHOUT PRESENCE OF ULCERS  *URINARY PROBLEMS  *BOWEL PROBLEMS  UNUSUAL RASH Items with * indicate a potential emergency and should be followed up as soon as possible.  Feel free to call the clinic you have any questions or concerns. The clinic phone number is [phone].

## 2013-11-24 NOTE — Progress Notes (Signed)
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   PRIOR THERAPY:  1. Status post left upper lobectomy with mediastinal lymph node dissection under the care of Dr. Roxan Hockey on December 10, 2007.  2. Status post 4 cycles of adjuvant chemotherapy with cisplatin and Taxotere. Last dose was given 03/22/2008.  3. Status post 6 cycles of systemic chemotherapy with carboplatin and Alimta for metastatic disease in the bone. The last dose was given July 02, 2010, with stable disease.  4. Status post 12 cycles of maintenance Alimta at 500 mg/sq m given every 3 weeks. The last dose was given 05/08/2011.  CURRENT THERAPY:  1. Maintenance Alimta at 500 mg/sq m given every 4 weeks. The patient is status post 29 cycles.  2. Zometa 4 mg IV given every 2 months for bone metastasis. Last given 09/29/2013  CHEMOTHERAPY INTENT: Palliative  CURRENT # OF CHEMOTHERAPY CYCLES: 30 CURRENT ANTIEMETICS: none  CURRENT SMOKING STATUS: Never smoker   ORAL CHEMOTHERAPY AND CONSENT: n/a  CURRENT BISPHOSPHONATES USE: Yes, Zometa given every 2 months  PAIN MANAGEMENT: [employer]  NARCOTICS INDUCED CONSTIPATION: Occasional, uses MiraLax  LIVING WILL AND CODE STATUS: Has a living will   INTERVAL HISTORY: Tamara Ortiz 73 y.o. female returns to the clinic today for followup visit. The patient is feeling fine today with no specific complaints, except for increased achiness and arthralgias with the cold weather. She tolerated the last cycle of her systemic chemotherapy fairly well with no significant adverse effects. She denied having any significant chest pain, shortness of breath, cough or  hemoptysis. She has no nausea or vomiting. She has no fever or chills. She has no significant weight loss or night sweats. She does report some mild numbness and tingling in her fingers when she wakes up. 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 She's not sleeping as well, waking up aching. If she gets up and moves around she feels better. She complains of the Ativan is not completely controlling her nausea and requests a prescription for Zofran tablets.   MEDICAL HISTORY: Past Medical History  Diagnosis Date  . Hypertension   . HYPERLIPIDEMIA 11/05/2007  . ANXIETY 05/13/2009  . DEPRESSIVE DISORDER 05/06/2008  . HYPERTENSION 11/05/2007  . ALLERGIC RHINITIS 11/05/2007  . CONSTIPATION, CHRONIC 05/13/2009  . UTI 05/06/2008  . BACK PAIN 05/11/2008  . OSTEOPOROSIS 11/05/2007  . INSOMNIA-SLEEP DISORDER-UNSPEC 05/13/2009  . FATIGUE 05/13/2009  . Swelling, mass, or lump in chest 11/05/2007  . LUNG CANCER, HX OF 05/06/2008  . lung ca w/ bone mets dx'd 11/2007    lt hip and spine    ALLERGIES:  is allergic to amoxicillin.  MEDICATIONS:  Current Outpatient Prescriptions  Medication Sig Dispense Refill  . aspirin 81 MG EC tablet Take 81 mg by mouth daily.        . Calcium Carbonate-Vitamin D (CALCIUM-VITAMIN D) 500-200 MG-UNIT per tablet Take 1 tablet by mouth 2 (two) times daily.        . Flaxseed, Linseed, (FLAXSEED OIL) 1000 MG CAPS Take 1,000 mg by mouth daily.        [name]  folic acid (FOLVITE) 1 MG tablet TAKE 1 TABLET BY MOUTH EVERY DAY  90 tablet  1  . HYDROcodone-acetaminophen (NORCO) 5-325 MG per tablet Take 1 tablet by mouth every 6 (six) hours as needed.  30 tablet  0  . LORazepam (ATIVAN) 0.5 MG tablet TAKE 1 TABLET BY MOUTH EVERY 8 HOURS AS NEEDED FOR ANXIETY  40 tablet  1  . losartan-hydrochlorothiazide (HYZAAR) 50-12.5 MG per tablet TAKE 1 TABLET EVERY DAY  90 tablet  2  . Multiple Vitamin (ANTIOXIDANT A/C/E PO) Take by mouth daily.        .  Omega-3 350 MG CAPS Take by mouth daily.        . polyethylene glycol powder (MIRALAX) powder Take 17 g by mouth daily.        . Probiotic Product (PROBIOTIC FORMULA) CAPS Take 1 capsule by mouth daily.        . simvastatin (ZOCOR) 40 MG tablet Take 1 tablet (40 mg total) by mouth daily.  90 tablet  3  . vitamin B-12 (CYANOCOBALAMIN) 100 MCG tablet Take 50 mcg by mouth daily.        . vitamin E ([employer] VITAMIN E) 400 UNIT capsule Take 400 Units by mouth daily.        [name] zolendronic acid (ZOMETA) 4 MG/5ML injection Inject 4 mg into the vein once.        [name] zolpidem (AMBIEN CR) 12.5 MG CR tablet Take 1 tablet (12.5 mg total) by mouth at bedtime as needed for sleep.  90 tablet  1  . Alum & Mag Hydroxide-Simeth (MAGIC MOUTHWASH) SOLN Take 5 mLs by mouth 4 (four) times daily. Swish and spit  240 mL  0  . ondansetron (ZOFRAN) 8 MG tablet Take 1 tablet (8 mg total) by mouth every 8 (eight) hours as needed for nausea or vomiting.  20 tablet  1   No current facility-administered medications for this visit.    SURGICAL HISTORY:  Past Surgical History  Procedure Laterality Date  . Tubal ligation    . S/p lul lung surgury  12/2007  . Lobectomy      REVIEW OF SYSTEMS:  Constitutional: negative Eyes: negative Ears, nose, mouth, throat, and face: negative Respiratory: negative Cardiovascular: negative Gastrointestinal: positive for nausea Genitourinary:negative Integument/breast: negative Hematologic/lymphatic: negative Musculoskeletal:positive for arthralgias and back pain Neurological: positive for paresthesia Behavioral/Psych: negative Endocrine: negative Allergic/Immunologic: negative   PHYSICAL EXAMINATION: General appearance: alert, cooperative and no distress Head: Normocephalic, without obvious abnormality, atraumatic Neck: no adenopathy Lymph nodes: Cervical, supraclavicular, and axillary nodes normal. Resp: clear to auscultation bilaterally Back: symmetric, no curvature. ROM normal. No  CVA tenderness. Cardio: regular rate and rhythm, S1, S2 normal, no murmur, click, rub or gallop GI: soft, non-tender; bowel sounds normal; no masses,  no organomegaly Extremities: extremities normal, atraumatic, no cyanosis or edema Neurologic: Alert and oriented X 3, normal strength and tone. Normal symmetric reflexes. Normal coordination and gait  ECOG PERFORMANCE STATUS: 0 - Asymptomatic  Blood pressure 107/64, pulse 62, temperature 98.3 F (36.8 C), temperature source Oral, resp. rate 18, height _0  (1.651 m), weight 142 lb 4.8 oz (64.547 kg), SpO2 99.00%.  LABORATORY DATA: Lab Results  Component Value Date   WBC 3.7* 11/24/2013   HGB 12.5 11/24/2013   HCT 37.2 11/24/2013   MCV 95.6 11/24/2013   PLT 230 11/24/2013      Chemistry      Component Value Date/Time   NA 140 10/24/2013 1023   NA  135 10/08/2012 1459   NA 140 04/17/2012 0855   K 4.0 10/24/2013 1023   K 3.7 10/08/2012 1459   K 4.1 04/17/2012 0855   CL 100 03/04/2013 0901   CL 96 10/08/2012 1459   CL 99 04/17/2012 0855   CO2 31* 10/24/2013 1023   CO2 29 10/08/2012 1459   CO2 29 04/17/2012 0855   BUN 14.1 10/24/2013 1023   BUN 14 10/08/2012 1459   BUN 15 04/17/2012 0855   CREATININE 1.0 10/24/2013 1023   CREATININE 0.96 10/08/2012 1459   CREATININE 1.4* 04/17/2012 0855      Component Value Date/Time   CALCIUM 10.3 10/24/2013 1023   CALCIUM 9.9 10/08/2012 1459   CALCIUM 9.5 04/17/2012 0855   ALKPHOS 62 10/24/2013 1023   ALKPHOS 74 10/08/2012 1459   ALKPHOS 68 04/17/2012 0855   AST 24 10/24/2013 1023   AST 22 10/08/2012 1459   AST 29 04/17/2012 0855   ALT 22 10/24/2013 1023   ALT 17 10/08/2012 1459   ALT 31 04/17/2012 0855   BILITOT 0.45 10/24/2013 1023   BILITOT 0.2* 10/08/2012 1459   BILITOT 0.60 04/17/2012 0855       RADIOGRAPHIC STUDIES: Ct Chest W Contrast  10/24/2013   CLINICAL DATA:  Lung cancer with bone metastases  EXAM: CT CHEST, ABDOMEN, AND PELVIS WITH CONTRAST  TECHNIQUE: Multidetector CT imaging of the chest, abdomen and pelvis  was performed following the standard protocol during bolus administration of intravenous contrast.  CONTRAST:  176m OMNIPAQUE IOHEXOL 300 MG/ML  SOLN  COMPARISON:  07/28/2013.  FINDINGS:   CT CHEST FINDINGS  There is no evidence for axillary lymphadenopathy. No mediastinal or hilar lymphadenopathy. The heart is enlarged with evidence of coronary artery calcification. No pericardial or pleural effusion.  The patient is status post left upper lobectomy. The right upper lobe 3 mm lung nodule seen on the previous study is almost an apparent on today's study but is visualized on image 14 of series 5. No other pulmonary parenchymal nodule or mass is evident. Some linear scarring in the left lung base is unchanged in the interval.    CT ABDOMEN AND PELVIS FINDINGS  No focal abnormality is seen in the liver or spleen. A small hiatal hernia is noted. Stomach otherwise unremarkable. The duodenum, pancreas, gallbladder, and adrenal glands have normal imaging features. No focal mass lesion is seen in either kidney.  No abdominal aortic aneurysm. There is no free fluid or lymphadenopathy in the abdomen  Imaging through the pelvis shows no free intraperitoneal fluid. There is no pelvic sidewall lymphadenopathy. Laminar flow on opacified blood is identified in the left common iliac vein.  Uterus is unremarkable. There is no adnexal mass. No colonic diverticulitis. The terminal ileum is normal. The appendix is not visualized, but there is no edema or inflammation in the region of the cecum. Mixed lytic and sclerotic lesion in the left iliac bone is stable. The sclerotic lesion in the right sacrum is stable. Degenerative facet disease is noted in the lower lumbar spine. No change in the sclerotic T7 lesion.    IMPRESSION: Stable chest CT. Status post left upper lobectomy without evidence for disease recurrence.  No evidence for metastatic disease in the soft tissues of the abdomen or pelvis.  Stable scattered bony lesions  consistent with the reported metastatic disease.   Electronically Signed   By: EMisty StanleyM.D.   On: 10/24/2013 14:06    ASSESSMENT AND PLAN: This is a very pleasant 72  years old white female with recurrent non-small cell lung cancer, adenocarcinoma. The patient is currently on maintenance Alimta monthly status post total of 29 cycles.  Her recent CT scan of the chest, abdomen and pelvis showed no evidence for disease progression. I discussed the scan results with the patient today. Patient was discussed with and also seen by Dr. Julien Nordmann. For her nausea prescription for Zofran tablets 8 mg by mouth every 8-12 hours as needed for nausea was sent her pharmacy of record via E. scribed. We will monitor her complaints of paresthesias affecting her fingers although this might be multifactorial including questionable carpal tunnel impingement as well as some arthritic component in her spine. She will proceed with cycle #30 today as scheduled. She is also due for her Zometa infusion will receive that today as well. She will continue her Zometa infusions every 2 months for her bone disease.  She will followup in one month for evaluation before starting the next cycle of her chemotherapy. The patient was advised to call immediately if she has any concerning symptoms in the interval.  The patient voices understanding of current disease status and treatment options and is in agreement with the current care plan.  All questions were answered. The patient knows to call the clinic with any problems, questions or concerns. We can certainly see the patient much sooner if necessary.  Carlton Adam PA-C  ADDENDUM:  Hematology/Oncology Attending:  I had a face to face encounter with the patient today. I recommended her care plan. This is a very pleasant 73 years old white female with metastatic non-small cell lung cancer, adenocarcinoma currently undergoing maintenance chemotherapy with single agent Alimta on  monthly basis status post 29 cycles in addition to previous 12 cycles with the same regimen but on 3 weekly basis. She is tolerating her treatment fairly well with no significant adverse effects but she noticed some mild numbness and tingling is in the fingers recently. I explained to the patient that there is a possibility that the peripheral neuropathy could be related to her treatment but this is a very rare side effect of Alimta and could be related to any other condition like carpal tunnels which the patient had in the past. I recommended for her to continue with her current treatment with maintenance Alimta as scheduled on monthly basis. We will continue to monitor the peripheral neuropathy closely. The patient would come back for follow up visit in one month's for reevaluation. She was advised to call immediately if she has any concerning symptoms in the interval.  Disclaimer: This note was dictated with voice recognition software. Similar sounding words can inadvertently be transcribed and may not be corrected upon review. Eilleen Kempf., MD 11/24/2013

## 2013-11-24 NOTE — Telephone Encounter (Signed)
Gave pt appt for lab,md and chemo for March

## 2013-11-24 NOTE — Telephone Encounter (Signed)
Per staff message and POF I have scheduled appts.  [name]  

## 2013-11-24 NOTE — Progress Notes (Signed)
Patient is familiar to me from previous consultations.  Patient is requesting a brief consultation today.  She is a 73 year old female diagnosed with metastatic lung cancer.  Weight is stable at approximately 142 pounds.  Patient reports she is experiencing some nausea.  She has tried some different foods and has found some she tolerates better.  Patient has questions about increasing variety.  Patient was educated on strategies for eating with nausea.  I provided a fact sheet for patient.  Teach back method used.  Encouraged patient to contact me if she has further questions.

## 2013-11-24 NOTE — Patient Instructions (Signed)
Follow up in one month.

## 2013-12-16 ENCOUNTER — Encounter: Payer: Self-pay | Admitting: Internal Medicine

## 2013-12-16 ENCOUNTER — Ambulatory Visit (INDEPENDENT_AMBULATORY_CARE_PROVIDER_SITE_OTHER): Payer: Medicare Other | Admitting: Internal Medicine

## 2013-12-16 VITALS — BP 132/78 | HR 79 | Temp 98.6°F | Ht 65.0 in | Wt 141.0 lb

## 2013-12-16 DIAGNOSIS — F329 Major depressive disorder, single episode, unspecified: Secondary | ICD-10-CM

## 2013-12-16 DIAGNOSIS — G4700 Insomnia, unspecified: Secondary | ICD-10-CM

## 2013-12-16 DIAGNOSIS — F3289 Other specified depressive episodes: Secondary | ICD-10-CM

## 2013-12-16 DIAGNOSIS — F411 Generalized anxiety disorder: Secondary | ICD-10-CM

## 2013-12-16 DIAGNOSIS — I10 Essential (primary) hypertension: Secondary | ICD-10-CM

## 2013-12-16 MED ORDER — LOSARTAN POTASSIUM-HCTZ 50-12.5 MG PO TABS: 1.0000 | ORAL_TABLET | Freq: Every day | ORAL | Status: DC

## 2013-12-16 MED ORDER — ZOLPIDEM TARTRATE ER 12.5 MG PO TBCR: 12.5000 mg | EXTENDED_RELEASE_TABLET | Freq: Every evening | ORAL | Status: DC | PRN

## 2013-12-16 MED ORDER — SIMVASTATIN 40 MG PO TABS: 40.0000 mg | ORAL_TABLET | Freq: Every day | ORAL | Status: DC

## 2013-12-16 NOTE — Assessment & Plan Note (Signed)
stable overall by history and exam, recent data reviewed with pt, and pt to continue medical treatment as before,  to f/u any worsening symptoms or concerns BP Readings from Last 3 Encounters:  12/16/13 132/78  11/24/13 107/64  10/27/13 116/69

## 2013-12-16 NOTE — Assessment & Plan Note (Signed)
stable overall by history and exam, , and pt to continue medical treatment as before,  to f/u any worsening symptoms or concerns  

## 2013-12-16 NOTE — Progress Notes (Signed)
Pre visit review using our clinic review tool, if applicable. No additional management support is needed unless otherwise documented below in the visit note. 

## 2013-12-16 NOTE — Progress Notes (Signed)
Subjective:    Patient ID: Tamara Ortiz, female    DOB: 01-10-1941, 73 y.o.   MRN: [id number]  HPI  Here to f/u; overall doing ok,  Pt denies chest pain, increased sob or doe, wheezing, orthopnea, PND, increased LE swelling, palpitations, dizziness or syncope.  Pt denies polydipsia, polyuria, or low sugar symptoms such as weakness or confusion improved with po intake.  Pt denies new neurological symptoms such as new headache, or facial or extremity weakness or numbness.   Pt states overall good compliance with meds, has been trying to follow lower cholesterol, diabetic diet, with wt overall stable,  but little exercise however. Pain well controlled.  Needs ambien refill. Declines tetanus/prevnar today. Getx CMT ever 4 wks, on zometa q o month. Denies worsening depressive symptoms, suicidal ideation, or panic Past Medical History  Diagnosis Date  . Hypertension   . HYPERLIPIDEMIA 11/05/2007  . ANXIETY 05/13/2009  . DEPRESSIVE DISORDER 05/06/2008  . HYPERTENSION 11/05/2007  . ALLERGIC RHINITIS 11/05/2007  . CONSTIPATION, CHRONIC 05/13/2009  . UTI 05/06/2008  . BACK PAIN 05/11/2008  . OSTEOPOROSIS 11/05/2007  . INSOMNIA-SLEEP DISORDER-UNSPEC 05/13/2009  . FATIGUE 05/13/2009  . Swelling, mass, or lump in chest 11/05/2007  . LUNG CANCER, HX OF 05/06/2008  . lung ca w/ bone mets dx'd 11/2007    lt hip and spine   Past Surgical History  Procedure Laterality Date  . Tubal ligation    . S/p lul lung surgury  12/2007  . Lobectomy      reports that she has never smoked. She has never used smokeless tobacco. She reports that she drinks alcohol. She reports that she does not use illicit drugs. family history includes ALS in her father; Alcohol abuse in her son; Heart disease in her mother. Allergies  Allergen Reactions  . Amoxicillin     REACTION: hives/itch   Current Outpatient Prescriptions on File Prior to Visit  Medication Sig Dispense Refill  . Alum & Mag Hydroxide-Simeth (MAGIC MOUTHWASH) SOLN Take 5  mLs by mouth 4 (four) times daily. Swish and spit  240 mL  0  . aspirin 81 MG EC tablet Take 81 mg by mouth daily.        . Calcium Carbonate-Vitamin D (CALCIUM-VITAMIN D) 500-200 MG-UNIT per tablet Take 1 tablet by mouth 2 (two) times daily.        . Flaxseed, Linseed, (FLAXSEED OIL) 1000 MG CAPS Take 1,000 mg by mouth daily.        . folic acid (FOLVITE) 1 MG tablet TAKE 1 TABLET BY MOUTH EVERY DAY  90 tablet  1  . HYDROcodone-acetaminophen (NORCO) 5-325 MG per tablet Take 1 tablet by mouth every 6 (six) hours as needed.  30 tablet  0  . LORazepam (ATIVAN) 0.5 MG tablet Take 1 tablet (0.5 mg total) by mouth every 8 (eight) hours as needed for anxiety.  40 tablet  1  . Multiple Vitamin (ANTIOXIDANT A/C/E PO) Take by mouth daily.        . Omega-3 350 MG CAPS Take by mouth daily.        . ondansetron (ZOFRAN) 8 MG tablet Take 1 tablet (8 mg total) by mouth every 8 (eight) hours as needed for nausea or vomiting.  20 tablet  1  . polyethylene glycol powder (MIRALAX) powder Take 17 g by mouth daily.        . Probiotic Product (PROBIOTIC FORMULA) CAPS Take 1 capsule by mouth daily.        [name]  vitamin B-12 (CYANOCOBALAMIN) 100 MCG tablet Take 50 mcg by mouth daily.        . vitamin E ([employer] VITAMIN E) 400 UNIT capsule Take 400 Units by mouth daily.        [name] zolendronic acid (ZOMETA) 4 MG/5ML injection Inject 4 mg into the vein once.         No current facility-administered medications on file prior to visit.   Review of Systems  Constitutional: Negative for unexpected weight change, or unusual diaphoresis  HENT: Negative for tinnitus.   Eyes: Negative for photophobia and visual disturbance.  Respiratory: Negative for choking and stridor.   Gastrointestinal: Negative for vomiting and blood in stool.  Genitourinary: Negative for hematuria and decreased urine volume.  Musculoskeletal: Negative for acute joint swelling Skin: Negative for color change and wound.  Neurological: Negative for tremors and  numbness other than noted  Psychiatric/Behavioral: Negative for decreased concentration or  hyperactivity.       Objective:   Physical Exam BP 132/78  Pulse 79  Temp(Src) 98.6 F (37 C) (Oral)  Ht 5\' 5"  (1.651 m)  Wt 141 lb (63.957 kg)  BMI 23.46 kg/m2  SpO2 97% VS noted, not ill apperaing Constitutional: Pt appears well-developed and well-nourished.  HENT: Head: NCAT.  Right Ear: External ear normal.  Left Ear: External ear normal.  Eyes: Conjunctivae and EOM are normal. Pupils are equal, round, and reactive to light.  Neck: Normal range of motion. Neck supple.  Cardiovascular: Normal rate and regular rhythm.   Pulmonary/Chest: Effort normal and breath sounds normal.  Abd:  Soft, NT, non-distended, + BS Neurological: Pt is alert. Not confused , no spine tender Skin: Skin is warm. No erythema.  Psychiatric: Pt behavior is normal. Thought content normal. not depressed affect, mild nervous only    Assessment & Plan:

## 2013-12-16 NOTE — Assessment & Plan Note (Signed)
For ambien refill,  to f/u any worsening symptoms or concerns

## 2013-12-16 NOTE — Patient Instructions (Signed)
Please continue all other medications as before, and refills have been done if requested. Please have the pharmacy call with any other refills you may need.  Please continue your efforts at being more active, low cholesterol diet, and weight control.  Please keep your appointments with your specialists as you have planned  You are asked to do the Urine Drug Screen today  We can hold off on further lab work today  Please consider making a Nurse Visit at some time if you change your mind about the tetanus and new prevnar pneumonia shots  Please return in 6 months, or sooner if needed

## 2013-12-16 NOTE — Assessment & Plan Note (Signed)
stable overall by history and exam, recent data reviewed with pt, and pt to continue medical treatment as before,  to f/u any worsening symptoms or concerns Lab Results  Component Value Date   WBC 3.7* 11/24/2013   HGB 12.5 11/24/2013   HCT 37.2 11/24/2013   PLT 230 11/24/2013   GLUCOSE 84 11/24/2013   CHOL 246* 06/25/2013   TRIG 68.0 06/25/2013   HDL 91.70 06/25/2013   LDLDIRECT 137.9 06/25/2013   LDLCALC 108* 05/13/2009   ALT 18 11/24/2013   AST 24 11/24/2013   NA 139 11/24/2013   K 3.8 11/24/2013   CL 100 03/04/2013   CREATININE 1.0 11/24/2013   BUN 15.4 11/24/2013   CO2 28 11/24/2013   TSH 0.61 06/25/2013   INR 1.00 01/24/2010   HGBA1C  Value: 5.4 (NOTE)   The ADA recommends the following therapeutic goals for glycemic   control related to Hgb A1C measurement:   Goal of Therapy:   < 7.0% Hgb A1C   Action Suggested:  > 8.0% Hgb A1C   Ref:  Diabetes Care, 22, Suppl. 1, 1999 10/30/2007

## 2013-12-22 ENCOUNTER — Encounter: Payer: Self-pay | Admitting: Internal Medicine

## 2013-12-22 ENCOUNTER — Telehealth: Payer: Self-pay | Admitting: Internal Medicine

## 2013-12-22 ENCOUNTER — Other Ambulatory Visit (HOSPITAL_BASED_OUTPATIENT_CLINIC_OR_DEPARTMENT_OTHER): Payer: Medicare Other

## 2013-12-22 ENCOUNTER — Ambulatory Visit (HOSPITAL_BASED_OUTPATIENT_CLINIC_OR_DEPARTMENT_OTHER): Payer: Medicare Other | Admitting: Internal Medicine

## 2013-12-22 ENCOUNTER — Ambulatory Visit (HOSPITAL_BASED_OUTPATIENT_CLINIC_OR_DEPARTMENT_OTHER): Payer: Medicare Other

## 2013-12-22 VITALS — BP 120/63 | HR 59 | Temp 98.1°F | Resp 18 | Ht 65.0 in | Wt 142.3 lb

## 2013-12-22 DIAGNOSIS — C3410 Malignant neoplasm of upper lobe, unspecified bronchus or lung: Secondary | ICD-10-CM

## 2013-12-22 DIAGNOSIS — C7952 Secondary malignant neoplasm of bone marrow: Secondary | ICD-10-CM

## 2013-12-22 DIAGNOSIS — C7951 Secondary malignant neoplasm of bone: Secondary | ICD-10-CM

## 2013-12-22 DIAGNOSIS — C3490 Malignant neoplasm of unspecified part of unspecified bronchus or lung: Secondary | ICD-10-CM

## 2013-12-22 DIAGNOSIS — Z5111 Encounter for antineoplastic chemotherapy: Secondary | ICD-10-CM

## 2013-12-22 LAB — CBC WITH DIFFERENTIAL/PLATELET
BASO%: 1.2 % (ref 0.0–2.0)
BASOS ABS: 0.1 10*3/uL (ref 0.0–0.1)
EOS%: 1.5 % (ref 0.0–7.0)
Eosinophils Absolute: 0.1 10*3/uL (ref 0.0–0.5)
HEMATOCRIT: 39.3 % (ref 34.8–46.6)
HEMOGLOBIN: 13.1 g/dL (ref 11.6–15.9)
LYMPH%: 32.4 % (ref 14.0–49.7)
MCH: 32.4 pg (ref 25.1–34.0)
MCHC: 33.3 g/dL (ref 31.5–36.0)
MCV: 97.3 fL (ref 79.5–101.0)
MONO#: 0.4 10*3/uL (ref 0.1–0.9)
MONO%: 10.7 % (ref 0.0–14.0)
NEUT%: 54.2 % (ref 38.4–76.8)
NEUTROS ABS: 2.2 10*3/uL (ref 1.5–6.5)
Platelets: 254 10*3/uL (ref 145–400)
RBC: 4.04 10*6/uL (ref 3.70–5.45)
RDW: 12.9 % (ref 11.2–14.5)
WBC: 4.1 10*3/uL (ref 3.9–10.3)
lymph#: 1.3 10*3/uL (ref 0.9–3.3)
nRBC: 0 % (ref 0–0)

## 2013-12-22 LAB — COMPREHENSIVE METABOLIC PANEL (CC13)
ALBUMIN: 3.7 g/dL (ref 3.5–5.0)
ALT: 20 U/L (ref 0–55)
ANION GAP: 11 meq/L (ref 3–11)
AST: 22 U/L (ref 5–34)
Alkaline Phosphatase: 63 U/L (ref 40–150)
BUN: 15.1 mg/dL (ref 7.0–26.0)
CALCIUM: 10.5 mg/dL — AB (ref 8.4–10.4)
CHLORIDE: 104 meq/L (ref 98–109)
CO2: 25 mEq/L (ref 22–29)
Creatinine: 0.9 mg/dL (ref 0.6–1.1)
Glucose: 104 mg/dl (ref 70–140)
Potassium: 3.9 mEq/L (ref 3.5–5.1)
SODIUM: 140 meq/L (ref 136–145)
TOTAL PROTEIN: 6.8 g/dL (ref 6.4–8.3)
Total Bilirubin: 0.42 mg/dL (ref 0.20–1.20)

## 2013-12-22 MED ORDER — CYANOCOBALAMIN 1000 MCG/ML IJ SOLN
1000.0000 ug | Freq: Once | INTRAMUSCULAR | Status: AC
  Administered 2013-12-22: 1000 ug via INTRAMUSCULAR

## 2013-12-22 MED ORDER — DEXAMETHASONE SODIUM PHOSPHATE 10 MG/ML IJ SOLN
INTRAMUSCULAR | Status: AC
  Filled 2013-12-22: qty 1

## 2013-12-22 MED ORDER — ONDANSETRON 8 MG/NS 50 ML IVPB
INTRAVENOUS | Status: AC
  Filled 2013-12-22: qty 8

## 2013-12-22 MED ORDER — ONDANSETRON 8 MG/50ML IVPB (CHCC)
8.0000 mg | Freq: Once | INTRAVENOUS | Status: AC
  Administered 2013-12-22: 8 mg via INTRAVENOUS

## 2013-12-22 MED ORDER — DEXAMETHASONE SODIUM PHOSPHATE 10 MG/ML IJ SOLN
10.0000 mg | Freq: Once | INTRAMUSCULAR | Status: AC
  Administered 2013-12-22: 10 mg via INTRAVENOUS

## 2013-12-22 MED ORDER — CYANOCOBALAMIN 1000 MCG/ML IJ SOLN
INTRAMUSCULAR | Status: AC
  Filled 2013-12-22: qty 1

## 2013-12-22 MED ORDER — SODIUM CHLORIDE 0.9 % IV SOLN
500.0000 mg/m2 | Freq: Once | INTRAVENOUS | Status: AC
  Administered 2013-12-22: 875 mg via INTRAVENOUS
  Filled 2013-12-22: qty 35

## 2013-12-22 MED ORDER — SODIUM CHLORIDE 0.9 % IV SOLN
Freq: Once | INTRAVENOUS | Status: AC
  Administered 2013-12-22: 10:00:00 via INTRAVENOUS

## 2013-12-22 NOTE — Telephone Encounter (Signed)
[facility] APPT SCHEDULE FOR APRIL. [facility] WILL CALL RE CT - PT AWARE.

## 2013-12-22 NOTE — Progress Notes (Signed)
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   PRIOR THERAPY:  1. Status post left upper lobectomy with mediastinal lymph node dissection under the care of Dr. Roxan Hockey on December 10, 2007.  2. Status post 4 cycles of adjuvant chemotherapy with cisplatin and Taxotere. Last dose was given 03/22/2008.  3. Status post 6 cycles of systemic chemotherapy with carboplatin and Alimta for metastatic disease in the bone. The last dose was given July 02, 2010, with stable disease.  4. Status post 12 cycles of maintenance Alimta at 500 mg/sq m given every 3 weeks. The last dose was given 05/08/2011.  CURRENT THERAPY:  1. Maintenance Alimta at 500 mg/sq m given every 4 weeks. The patient is status post 30 cycles.  2. Zometa 4 mg IV given every 2 months for bone metastasis.   CHEMOTHERAPY INTENT: Palliative  CURRENT # OF CHEMOTHERAPY CYCLES: 31 CURRENT ANTIEMETICS: none  CURRENT SMOKING STATUS: Never smoker   ORAL CHEMOTHERAPY AND CONSENT: n/a  CURRENT BISPHOSPHONATES USE: Yes, Zometa given every 2 months  PAIN MANAGEMENT: [employer]  NARCOTICS INDUCED CONSTIPATION: Occasional, uses MiraLax  LIVING WILL AND CODE STATUS: Has a living will   INTERVAL HISTORY: Tamara Ortiz 73 y.o. female returns to the clinic today for followup visit. The patient is feeling fine today with no specific complaints, except for mild neck pain and arthralgias. She tolerated the last cycle of her systemic chemotherapy fairly well with no significant adverse effects. She denied having any significant chest pain, shortness of breath, cough or hemoptysis. She has no nausea or vomiting. She has no  fever or chills. She has no significant weight loss or night sweats. [name]  MEDICAL HISTORY: Past Medical History  Diagnosis Date  . Hypertension   . HYPERLIPIDEMIA 11/05/2007  . ANXIETY 05/13/2009  . DEPRESSIVE DISORDER 05/06/2008  . HYPERTENSION 11/05/2007  . ALLERGIC RHINITIS 11/05/2007  . CONSTIPATION, CHRONIC 05/13/2009  . UTI 05/06/2008  . BACK PAIN 05/11/2008  . OSTEOPOROSIS 11/05/2007  . INSOMNIA-SLEEP DISORDER-UNSPEC 05/13/2009  . FATIGUE 05/13/2009  . Swelling, mass, or lump in chest 11/05/2007  . LUNG CANCER, HX OF 05/06/2008  . lung ca w/ bone mets dx'd 11/2007    lt hip and spine    ALLERGIES:  is allergic to amoxicillin.  MEDICATIONS:  Current Outpatient Prescriptions  Medication Sig Dispense Refill  . Alum & Mag Hydroxide-Simeth (MAGIC MOUTHWASH) SOLN Take 5 mLs by mouth 4 (four) times daily. Swish and spit  240 mL  0  . aspirin 81 MG EC tablet Take 81 mg by mouth daily.        . Calcium Carbonate-Vitamin D (CALCIUM-VITAMIN D) 500-200 MG-UNIT per tablet Take 1 tablet by mouth 2 (two) times daily.        . Flaxseed, Linseed, (FLAXSEED OIL) 1000 MG CAPS Take 1,000 mg by mouth daily.        . folic acid (FOLVITE) 1 MG tablet TAKE 1 TABLET BY MOUTH EVERY DAY  90 tablet  1  . HYDROcodone-acetaminophen (NORCO) 5-325 MG per tablet Take 1 tablet by mouth every 6 (six) hours as needed.  30 tablet  0  . LORazepam (ATIVAN) 0.5 MG tablet Take  1 tablet (0.5 mg total) by mouth every 8 (eight) hours as needed for anxiety.  40 tablet  1  . losartan-hydrochlorothiazide (HYZAAR) 50-12.5 MG per tablet Take 1 tablet by mouth daily.  90 tablet  3  . Multiple Vitamin (ANTIOXIDANT A/C/E PO) Take by mouth daily.        . Omega-3 350 MG CAPS Take by mouth daily.        . ondansetron (ZOFRAN) 8 MG tablet Take 1 tablet (8 mg total) by mouth every 8 (eight) hours as needed for nausea or vomiting.  20 tablet  1  . polyethylene glycol powder (MIRALAX) powder Take 17 g by mouth daily.        . Probiotic Product  (PROBIOTIC FORMULA) CAPS Take 1 capsule by mouth daily.        . simvastatin (ZOCOR) 40 MG tablet Take 1 tablet (40 mg total) by mouth daily.  90 tablet  3  . vitamin B-12 (CYANOCOBALAMIN) 100 MCG tablet Take 50 mcg by mouth daily.        . vitamin E ([employer] VITAMIN E) 400 UNIT capsule Take 400 Units by mouth daily.        [name] zolendronic acid (ZOMETA) 4 MG/5ML injection Inject 4 mg into the vein once.        [name] zolpidem (AMBIEN CR) 12.5 MG CR tablet Take 1 tablet (12.5 mg total) by mouth at bedtime as needed for sleep.  90 tablet  1   No current facility-administered medications for this visit.    SURGICAL HISTORY:  Past Surgical History  Procedure Laterality Date  . Tubal ligation    . S/p lul lung surgury  12/2007  . Lobectomy      REVIEW OF SYSTEMS:  Constitutional: negative Eyes: negative Ears, nose, mouth, throat, and face: negative Respiratory: negative Cardiovascular: negative Gastrointestinal: negative Genitourinary:negative Integument/breast: negative Hematologic/lymphatic: negative Musculoskeletal:positive for arthralgias and back pain Neurological: negative Behavioral/Psych: negative Endocrine: negative Allergic/Immunologic: negative   PHYSICAL EXAMINATION: General appearance: alert, cooperative and no distress Head: Normocephalic, without obvious abnormality, atraumatic Neck: no adenopathy Lymph nodes: Cervical, supraclavicular, and axillary nodes normal. Resp: clear to auscultation bilaterally Back: symmetric, no curvature. ROM normal. No CVA tenderness. Cardio: regular rate and rhythm, S1, S2 normal, no murmur, click, rub or gallop GI: soft, non-tender; bowel sounds normal; no masses,  no organomegaly Extremities: extremities normal, atraumatic, no cyanosis or edema Neurologic: Alert and oriented X 3, normal strength and tone. Normal symmetric reflexes. Normal coordination and gait  ECOG PERFORMANCE STATUS: 0 - Asymptomatic  Blood pressure 120/63, pulse 59,  temperature 98.1 F (36.7 C), temperature source Oral, resp. rate 18, height '5\' 5"'  (1.651 m), weight 142 lb 4.8 oz (64.547 kg), SpO2 98.00%.  LABORATORY DATA: Lab Results  Component Value Date   WBC 4.1 12/22/2013   HGB 13.1 12/22/2013   HCT 39.3 12/22/2013   MCV 97.3 12/22/2013   PLT 254 12/22/2013      Chemistry      Component Value Date/Time   NA 139 11/24/2013 0843   NA 135 10/08/2012 1459   NA 140 04/17/2012 0855   K 3.8 11/24/2013 0843   K 3.7 10/08/2012 1459   K 4.1 04/17/2012 0855   CL 100 03/04/2013 0901   CL 96 10/08/2012 1459   CL 99 04/17/2012 0855   CO2 28 11/24/2013 0843   CO2 29 10/08/2012 1459   CO2 29 04/17/2012 0855   BUN 15.4 11/24/2013 0843   BUN 14 10/08/2012 1459  BUN 15 04/17/2012 0855   CREATININE 1.0 11/24/2013 0843   CREATININE 0.96 10/08/2012 1459   CREATININE 1.4* 04/17/2012 0855      Component Value Date/Time   CALCIUM 10.4 11/24/2013 0843   CALCIUM 9.9 10/08/2012 1459   CALCIUM 9.5 04/17/2012 0855   ALKPHOS 59 11/24/2013 0843   ALKPHOS 74 10/08/2012 1459   ALKPHOS 68 04/17/2012 0855   AST 24 11/24/2013 0843   AST 22 10/08/2012 1459   AST 29 04/17/2012 0855   ALT 18 11/24/2013 0843   ALT 17 10/08/2012 1459   ALT 31 04/17/2012 0855   BILITOT 0.53 11/24/2013 0843   BILITOT 0.2* 10/08/2012 1459   BILITOT 0.60 04/17/2012 0855       RADIOGRAPHIC STUDIES:   ASSESSMENT AND PLAN: This is a very pleasant 72 years old white female with recurrent non-small cell lung cancer, adenocarcinoma. The patient is currently on maintenance Alimta monthly status post total of 28 cycles.  Her recent CT scan of the chest, abdomen and pelvis showed no evidence for disease progression. I discussed the scan results with the patient today. I recommended for the patient to proceed with cycle #30 today as scheduled.  She would come back for followup visit in one month for evaluation before starting the next cycle of her chemotherapy after repeating CT scan of the chest, abdomen and pelvis for restaging of  her disease.   The patient was advised to call immediately if she has any concerning symptoms in the interval.  The patient voices understanding of current disease status and treatment options and is in agreement with the current care plan.  All questions were answered. The patient knows to call the clinic with any problems, questions or concerns. We can certainly see the patient much sooner if necessary.  Disclaimer: This note was dictated with voice recognition software. Similar sounding words can inadvertently be transcribed and may not be corrected upon review.

## 2013-12-22 NOTE — Patient Instructions (Signed)
[facility] Discharge Instructions for Patients Receiving Chemotherapy  Today you received the following chemotherapy agents :  Alimta.  To help prevent nausea and vomiting after your treatment, we encourage you to take your nausea medication as instructed by your physician.   If you develop nausea and vomiting that is not controlled by your nausea medication, call the clinic.   BELOW ARE SYMPTOMS THAT SHOULD BE REPORTED IMMEDIATELY:  *FEVER GREATER THAN 100.5 F  *CHILLS WITH OR WITHOUT FEVER  NAUSEA AND VOMITING THAT IS NOT CONTROLLED WITH YOUR NAUSEA MEDICATION  *UNUSUAL SHORTNESS OF BREATH  *UNUSUAL BRUISING OR BLEEDING  TENDERNESS IN MOUTH AND THROAT WITH OR WITHOUT PRESENCE OF ULCERS  *URINARY PROBLEMS  *BOWEL PROBLEMS  UNUSUAL RASH Items with * indicate a potential emergency and should be followed up as soon as possible.  Feel free to call the clinic you have any questions or concerns. The clinic phone number is [phone].

## 2013-12-24 ENCOUNTER — Ambulatory Visit: Payer: Medicare Other | Admitting: Internal Medicine

## 2013-12-31 ENCOUNTER — Encounter: Payer: Self-pay | Admitting: Internal Medicine

## 2013-12-31 ENCOUNTER — Other Ambulatory Visit: Payer: Self-pay | Admitting: *Deleted

## 2013-12-31 MED ORDER — HYDROCODONE-ACETAMINOPHEN 5-325 MG PO TABS: 1.0000 | ORAL_TABLET | Freq: Four times a day (QID) | ORAL | Status: DC | PRN

## 2013-12-31 MED ORDER — FOLIC ACID 1 MG PO TABS: 1.0000 mg | ORAL_TABLET | Freq: Every day | ORAL | Status: DC

## 2013-12-31 NOTE — Telephone Encounter (Signed)
Notified patient that folic acid has been electronically sent and she needs to pick up the hydrocodone script.

## 2014-01-19 ENCOUNTER — Ambulatory Visit (HOSPITAL_COMMUNITY)
Admission: RE | Admit: 2014-01-19 | Discharge: 2014-01-19 | Disposition: A | Discharge status: Home / Self Care | Payer: Medicare Other | Source: Ambulatory Visit | Attending: Internal Medicine | Admitting: Internal Medicine

## 2014-01-19 ENCOUNTER — Ambulatory Visit: Payer: Medicare Other

## 2014-01-19 DIAGNOSIS — I7090 Unspecified atherosclerosis: Secondary | ICD-10-CM | POA: Insufficient documentation

## 2014-01-19 DIAGNOSIS — M899 Disorder of bone, unspecified: Secondary | ICD-10-CM | POA: Insufficient documentation

## 2014-01-19 DIAGNOSIS — M949 Disorder of cartilage, unspecified: Secondary | ICD-10-CM

## 2014-01-19 DIAGNOSIS — Z9221 Personal history of antineoplastic chemotherapy: Secondary | ICD-10-CM | POA: Insufficient documentation

## 2014-01-19 DIAGNOSIS — C3490 Malignant neoplasm of unspecified part of unspecified bronchus or lung: Secondary | ICD-10-CM | POA: Insufficient documentation

## 2014-01-19 DIAGNOSIS — K449 Diaphragmatic hernia without obstruction or gangrene: Secondary | ICD-10-CM | POA: Insufficient documentation

## 2014-01-19 DIAGNOSIS — Z902 Acquired absence of lung [part of]: Secondary | ICD-10-CM | POA: Insufficient documentation

## 2014-01-19 MED ORDER — IOHEXOL 300 MG/ML  SOLN
100.0000 mL | Freq: Once | INTRAMUSCULAR | Status: AC | PRN
  Administered 2014-01-19: 100 mL via INTRAVENOUS

## 2014-01-20 ENCOUNTER — Ambulatory Visit (HOSPITAL_BASED_OUTPATIENT_CLINIC_OR_DEPARTMENT_OTHER): Payer: Medicare Other

## 2014-01-20 ENCOUNTER — Encounter: Payer: Self-pay | Admitting: Internal Medicine

## 2014-01-20 ENCOUNTER — Other Ambulatory Visit (HOSPITAL_BASED_OUTPATIENT_CLINIC_OR_DEPARTMENT_OTHER): Payer: Medicare Other

## 2014-01-20 ENCOUNTER — Ambulatory Visit (HOSPITAL_BASED_OUTPATIENT_CLINIC_OR_DEPARTMENT_OTHER): Payer: Medicare Other | Admitting: Internal Medicine

## 2014-01-20 VITALS — BP 122/68 | HR 58 | Temp 98.1°F | Resp 20 | Ht 65.0 in | Wt 140.6 lb

## 2014-01-20 DIAGNOSIS — C3410 Malignant neoplasm of upper lobe, unspecified bronchus or lung: Secondary | ICD-10-CM

## 2014-01-20 DIAGNOSIS — C3490 Malignant neoplasm of unspecified part of unspecified bronchus or lung: Secondary | ICD-10-CM

## 2014-01-20 DIAGNOSIS — Z5111 Encounter for antineoplastic chemotherapy: Secondary | ICD-10-CM

## 2014-01-20 DIAGNOSIS — C7951 Secondary malignant neoplasm of bone: Secondary | ICD-10-CM

## 2014-01-20 DIAGNOSIS — C7952 Secondary malignant neoplasm of bone marrow: Secondary | ICD-10-CM

## 2014-01-20 LAB — COMPREHENSIVE METABOLIC PANEL (CC13)
ALBUMIN: 4.1 g/dL (ref 3.5–5.0)
ALT: 17 U/L (ref 0–55)
ANION GAP: 12 meq/L — AB (ref 3–11)
AST: 25 U/L (ref 5–34)
Alkaline Phosphatase: 67 U/L (ref 40–150)
BUN: 9.7 mg/dL (ref 7.0–26.0)
CALCIUM: 10.9 mg/dL — AB (ref 8.4–10.4)
CHLORIDE: 103 meq/L (ref 98–109)
CO2: 27 meq/L (ref 22–29)
CREATININE: 0.9 mg/dL (ref 0.6–1.1)
Glucose: 79 mg/dl (ref 70–140)
POTASSIUM: 4 meq/L (ref 3.5–5.1)
Sodium: 142 mEq/L (ref 136–145)
Total Bilirubin: 0.51 mg/dL (ref 0.20–1.20)
Total Protein: 7.3 g/dL (ref 6.4–8.3)

## 2014-01-20 LAB — CBC WITH DIFFERENTIAL/PLATELET
BASO%: 1.1 % (ref 0.0–2.0)
BASOS ABS: 0 10*3/uL (ref 0.0–0.1)
EOS%: 1.1 % (ref 0.0–7.0)
Eosinophils Absolute: 0 10*3/uL (ref 0.0–0.5)
HEMATOCRIT: 38.6 % (ref 34.8–46.6)
HGB: 13.2 g/dL (ref 11.6–15.9)
LYMPH#: 1.2 10*3/uL (ref 0.9–3.3)
LYMPH%: 33.7 % (ref 14.0–49.7)
MCH: 32.5 pg (ref 25.1–34.0)
MCHC: 34.2 g/dL (ref 31.5–36.0)
MCV: 95.1 fL (ref 79.5–101.0)
MONO#: 0.4 10*3/uL (ref 0.1–0.9)
MONO%: 10.3 % (ref 0.0–14.0)
NEUT#: 2 10*3/uL (ref 1.5–6.5)
NEUT%: 53.8 % (ref 38.4–76.8)
PLATELETS: 244 10*3/uL (ref 145–400)
RBC: 4.06 10*6/uL (ref 3.70–5.45)
RDW: 12.9 % (ref 11.2–14.5)
WBC: 3.7 10*3/uL — AB (ref 3.9–10.3)
nRBC: 0 % (ref 0–0)

## 2014-01-20 MED ORDER — DEXAMETHASONE SODIUM PHOSPHATE 10 MG/ML IJ SOLN
10.0000 mg | Freq: Once | INTRAMUSCULAR | Status: AC
  Administered 2014-01-20: 10 mg via INTRAVENOUS

## 2014-01-20 MED ORDER — DEXAMETHASONE SODIUM PHOSPHATE 10 MG/ML IJ SOLN
INTRAMUSCULAR | Status: AC
  Filled 2014-01-20: qty 1

## 2014-01-20 MED ORDER — PEMETREXED DISODIUM CHEMO INJECTION 500 MG
500.0000 mg/m2 | Freq: Once | INTRAVENOUS | Status: AC
  Administered 2014-01-20: 875 mg via INTRAVENOUS
  Filled 2014-01-20: qty 35

## 2014-01-20 MED ORDER — SODIUM CHLORIDE 0.9 % IV SOLN
Freq: Once | INTRAVENOUS | Status: AC
  Administered 2014-01-20: 11:00:00 via INTRAVENOUS

## 2014-01-20 MED ORDER — ONDANSETRON 8 MG/50ML IVPB (CHCC)
8.0000 mg | Freq: Once | INTRAVENOUS | Status: AC
  Administered 2014-01-20: 8 mg via INTRAVENOUS

## 2014-01-20 MED ORDER — ONDANSETRON 8 MG/NS 50 ML IVPB
INTRAVENOUS | Status: AC
  Filled 2014-01-20: qty 8

## 2014-01-20 MED ORDER — ZOLEDRONIC ACID 4 MG/100ML IV SOLN
4.0000 mg | Freq: Once | INTRAVENOUS | Status: DC
  Filled 2014-01-20: qty 100

## 2014-01-20 NOTE — Patient Instructions (Signed)
[facility] Discharge Instructions for Patients Receiving Chemotherapy  Today you received the following chemotherapy agents ALIMTA To help prevent nausea and vomiting after your treatment, we encourage you to take your nausea medication as prescribed.   If you develop nausea and vomiting that is not controlled by your nausea medication, call the clinic.   BELOW ARE SYMPTOMS THAT SHOULD BE REPORTED IMMEDIATELY:  *FEVER GREATER THAN 100.5 F  *CHILLS WITH OR WITHOUT FEVER  NAUSEA AND VOMITING THAT IS NOT CONTROLLED WITH YOUR NAUSEA MEDICATION  *UNUSUAL SHORTNESS OF BREATH  *UNUSUAL BRUISING OR BLEEDING  TENDERNESS IN MOUTH AND THROAT WITH OR WITHOUT PRESENCE OF ULCERS  *URINARY PROBLEMS  *BOWEL PROBLEMS  UNUSUAL RASH Items with * indicate a potential emergency and should be followed up as soon as possible.  Feel free to call the clinic you have any questions or concerns. The clinic phone number is [phone].

## 2014-01-20 NOTE — Progress Notes (Signed)
Tamara [phone]   [phone]  OFFICE PROGRESS NOTE  [name], MD 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   PRIOR THERAPY:  1. Status post left upper lobectomy with mediastinal lymph node dissection under the care of Dr. Roxan Hockey on December 10, 2007.  2. Status post 4 cycles of adjuvant chemotherapy with cisplatin and Taxotere. Last dose was given 03/22/2008.  3. Status post 6 cycles of systemic chemotherapy with carboplatin and Alimta for metastatic disease in the bone. The last dose was given July 02, 2010, with stable disease.  4. Status post 12 cycles of maintenance Alimta at 500 mg/sq m given every 3 weeks. The last dose was given 05/08/2011.  CURRENT THERAPY:  1. Maintenance Alimta at 500 mg/sq m given every 4 weeks. The patient is status post 31 cycles.  2. Zometa 4 mg IV given every 2 months for bone metastasis.   CHEMOTHERAPY INTENT: Palliative  CURRENT # OF CHEMOTHERAPY CYCLES: 32 CURRENT ANTIEMETICS: none  CURRENT SMOKING STATUS: Never smoker   ORAL CHEMOTHERAPY AND CONSENT: n/a  CURRENT BISPHOSPHONATES USE: Yes, Zometa given every 2 months  PAIN MANAGEMENT: [employer]  NARCOTICS INDUCED CONSTIPATION: Occasional, uses MiraLax  LIVING WILL AND CODE STATUS: Has a living will   INTERVAL HISTORY: Tamara Ortiz 73 y.o. female returns to the clinic today for followup visit. The patient is feeling fine today with no specific complaints, except for increasing fatigue that lasted for a few more days after her chemotherapy. She has no chest pain, shortness of breath, cough or hemoptysis. She denied having any significant nausea or vomiting. She has no fever or chills. She has no significant weight loss or night  sweats. She had repeat CT scan of the chest, abdomen and pelvis performed recently and she is here for evaluation and discussion of her scan results.  MEDICAL HISTORY: Past Medical History  Diagnosis Date  . Hypertension   . HYPERLIPIDEMIA 11/05/2007  . ANXIETY 05/13/2009  . DEPRESSIVE DISORDER 05/06/2008  . HYPERTENSION 11/05/2007  . ALLERGIC RHINITIS 11/05/2007  . CONSTIPATION, CHRONIC 05/13/2009  . UTI 05/06/2008  . BACK PAIN 05/11/2008  . OSTEOPOROSIS 11/05/2007  . INSOMNIA-SLEEP DISORDER-UNSPEC 05/13/2009  . FATIGUE 05/13/2009  . Swelling, mass, or lump in chest 11/05/2007  . LUNG CANCER, HX OF 05/06/2008  . lung ca w/ bone mets dx'd 11/2007    lt hip and spine    ALLERGIES:  is allergic to amoxicillin.  MEDICATIONS:  Current Outpatient Prescriptions  Medication Sig Dispense Refill  . Alum & Mag Hydroxide-Simeth (MAGIC MOUTHWASH) SOLN Take 5 mLs by mouth 4 (four) times daily. Swish and spit  240 mL  0  . aspirin 81 MG EC tablet Take 81 mg by mouth daily.        . Calcium Carbonate-Vitamin D (CALCIUM-VITAMIN D) 500-200 MG-UNIT per tablet Take 1 tablet by mouth 2 (two) times daily.        . Flaxseed, Linseed, (FLAXSEED OIL) 1000 MG CAPS Take 1,000 mg by mouth daily.        . folic acid (FOLVITE) 1 MG tablet Take 1 tablet (1 mg total) by mouth daily.  90 tablet  1  . HYDROcodone-acetaminophen (NORCO) 5-325 MG per tablet Take 1 tablet by mouth every 6 (  six) hours as needed for moderate pain.  30 tablet  0  . LORazepam (ATIVAN) 0.5 MG tablet Take 1 tablet (0.5 mg total) by mouth every 8 (eight) hours as needed for anxiety.  40 tablet  1  . losartan-hydrochlorothiazide (HYZAAR) 50-12.5 MG per tablet Take 1 tablet by mouth daily.  90 tablet  3  . Multiple Vitamin (ANTIOXIDANT A/C/E PO) Take by mouth daily.        . Omega-3 350 MG CAPS Take by mouth daily.        . ondansetron (ZOFRAN) 8 MG tablet Take 1 tablet (8 mg total) by mouth every 8 (eight) hours as needed for nausea or vomiting.  20 tablet  1    . polyethylene glycol powder (MIRALAX) powder Take 17 g by mouth daily.        . Probiotic Product (PROBIOTIC FORMULA) CAPS Take 1 capsule by mouth daily.        . simvastatin (ZOCOR) 40 MG tablet Take 1 tablet (40 mg total) by mouth daily.  90 tablet  3  . vitamin B-12 (CYANOCOBALAMIN) 100 MCG tablet Take 50 mcg by mouth daily.        . vitamin E ([employer]S VITAMIN E) 400 UNIT capsule Take 400 Units by mouth daily.        [name] zolendronic acid (ZOMETA) 4 MG/5ML injection Inject 4 mg into the vein once.        [name] zolpidem (AMBIEN CR) 12.5 MG CR tablet Take 1 tablet (12.5 mg total) by mouth at bedtime as needed for sleep.  90 tablet  1   No current facility-administered medications for this visit.    SURGICAL HISTORY:  Past Surgical History  Procedure Laterality Date  . Tubal ligation    . S/p lul lung surgury  12/2007  . Lobectomy      REVIEW OF SYSTEMS:  Constitutional: negative Eyes: negative Ears, nose, mouth, throat, and face: negative Respiratory: negative Cardiovascular: negative Gastrointestinal: negative Genitourinary:negative Integument/breast: negative Hematologic/lymphatic: negative Musculoskeletal:positive for arthralgias and back pain Neurological: negative Behavioral/Psych: negative Endocrine: negative Allergic/Immunologic: negative   PHYSICAL EXAMINATION: General appearance: alert, cooperative and no distress Head: Normocephalic, without obvious abnormality, atraumatic Neck: no adenopathy Lymph nodes: Cervical, supraclavicular, and axillary nodes normal. Resp: clear to auscultation bilaterally Back: symmetric, no curvature. ROM normal. No CVA tenderness. Cardio: regular rate and rhythm, S1, S2 normal, no murmur, click, rub or gallop GI: soft, non-tender; bowel sounds normal; no masses,  no organomegaly Extremities: extremities normal, atraumatic, no cyanosis or edema Neurologic: Alert and oriented X 3, normal strength and tone. Normal symmetric reflexes. Normal  coordination and gait  ECOG PERFORMANCE STATUS: 0 - Asymptomatic  Blood pressure 122/68, pulse 58, temperature 98.1 F (36.7 C), temperature source Oral, resp. rate 20, height _0  (1.651 m), weight 140 lb 9.6 oz (63.776 kg).  LABORATORY DATA: Lab Results  Component Value Date   WBC 3.7* 01/20/2014   HGB 13.2 01/20/2014   HCT 38.6 01/20/2014   MCV 95.1 01/20/2014   PLT 244 01/20/2014      Chemistry      Component Value Date/Time   NA 140 12/22/2013 0834   NA 135 10/08/2012 1459   NA 140 04/17/2012 0855   K 3.9 12/22/2013 0834   K 3.7 10/08/2012 1459   K 4.1 04/17/2012 0855   CL 100 03/04/2013 0901   CL 96 10/08/2012 1459   CL 99 04/17/2012 0855   CO2 25 12/22/2013 0834   CO2 29 10/08/2012  1459   CO2 29 04/17/2012 0855   BUN 15.1 12/22/2013 0834   BUN 14 10/08/2012 1459   BUN 15 04/17/2012 0855   CREATININE 0.9 12/22/2013 0834   CREATININE 0.96 10/08/2012 1459   CREATININE 1.4* 04/17/2012 0855      Component Value Date/Time   CALCIUM 10.5* 12/22/2013 0834   CALCIUM 9.9 10/08/2012 1459   CALCIUM 9.5 04/17/2012 0855   ALKPHOS 63 12/22/2013 0834   ALKPHOS 74 10/08/2012 1459   ALKPHOS 68 04/17/2012 0855   AST 22 12/22/2013 0834   AST 22 10/08/2012 1459   AST 29 04/17/2012 0855   ALT 20 12/22/2013 0834   ALT 17 10/08/2012 1459   ALT 31 04/17/2012 0855   BILITOT 0.42 12/22/2013 0834   BILITOT 0.2* 10/08/2012 1459   BILITOT 0.60 04/17/2012 0855       RADIOGRAPHIC STUDIES:  Ct Chest W Contrast  01/19/2014   CLINICAL DATA:  Restaging of lung cancer. Left-sided resection. Chemotherapy undergoing. Diagnosed initially in 2009.  EXAM: CT CHEST, ABDOMEN, AND PELVIS WITH CONTRAST  TECHNIQUE: Multidetector CT imaging of the chest, abdomen and pelvis was performed following the standard protocol during bolus administration of intravenous contrast.  CONTRAST:  131m OMNIPAQUE IOHEXOL 300 MG/ML  SOLN  COMPARISON:  None.  FINDINGS:   CT CHEST FINDINGS  Lungs/Pleura: Status post left upper lobectomy. Minimal motion  degradation in the midchest. Mild centrilobular emphysema. Mild left base scarring or atelectasis. No pleural fluid.  Heart/Mediastinum: No supraclavicular adenopathy. Small right axillary nodes which are not significantly changed. Mild cardiomegaly. Atherosclerosis, including within the coronary arteries. No pericardial effusion. No central pulmonary embolism, on this non-dedicated study. No mediastinal or hilar adenopathy.  Small hiatal hernia.    CT ABDOMEN AND PELVIS FINDINGS  Abdomen/Pelvis: Normal liver, spleen, distal stomach, pancreas, gallbladder, biliary tract, adrenal glands, kidneys. No retroperitoneal or retrocrural adenopathy. Scattered colonic diverticula. Normal terminal ileum. Normal small bowel without abdominal ascites. No pelvic adenopathy. Normal urinary bladder and uterus, without adnexal mass or significant free pelvic fluid.  Bones/Musculoskeletal: Multi focal sclerotic lesions. The right sacral index lesion measures 1.3 cm on image 85 and is unchanged in size. Slightly more sclerotic today. T7 vertebral body lesion is not significantly changed.    IMPRESSION: 1. Status post left upper lobectomy, without evidence of recurrent or extraosseous metastasis. 2. Similar distribution of sclerotic lesions, most consistent with metastatic disease. A right sacral lesion is minimally increased in sclerosis, possibly representing partial interval healing. 3. Small hiatal hernia. 4.  Atherosclerosis, including within the coronary arteries.   Electronically Signed   By: KAbigail MiyamotoM.D.   On: 01/19/2014 16:19    ASSESSMENT AND PLAN: This is a very pleasant 73years old white female with recurrent non-small cell lung cancer, adenocarcinoma. The patient is currently on maintenance Alimta monthly status post total of 31cycles.  Her recent CT scan of the chest, abdomen and pelvis showed no evidence for disease progression.  I discussed the scan results with the patient today. I recommended for the  patient to proceed with cycle #32 today as scheduled.  She would come back for followup visit in one month for evaluation before starting the next cycle of her chemotherapy after repeating CT scan of the chest, abdomen and pelvis for restaging of her disease.   The patient was advised to call immediately if she has any concerning symptoms in the interval.  The patient voices understanding of current disease status and treatment options and is in agreement  with the current care plan.  All questions were answered. The patient knows to call the clinic with any problems, questions or concerns. We can certainly see the patient much sooner if necessary.  Disclaimer: This note was dictated with voice recognition software. Similar sounding words can inadvertently be transcribed and may not be corrected upon review.       

## 2014-01-23 ENCOUNTER — Encounter: Payer: Self-pay | Admitting: Family Medicine

## 2014-01-23 ENCOUNTER — Ambulatory Visit (INDEPENDENT_AMBULATORY_CARE_PROVIDER_SITE_OTHER): Payer: Medicare Other | Admitting: Family Medicine

## 2014-01-23 VITALS — BP 100/62 | HR 68 | Temp 98.4°F | Wt 140.0 lb

## 2014-01-23 DIAGNOSIS — J329 Chronic sinusitis, unspecified: Secondary | ICD-10-CM

## 2014-01-23 MED ORDER — CLARITHROMYCIN ER 500 MG PO TB24: 1000.0000 mg | ORAL_TABLET | Freq: Every day | ORAL | Status: AC

## 2014-01-23 MED ORDER — FLUTICASONE PROPIONATE 50 MCG/ACT NA SUSP: 2.0000 | Freq: Every day | NASAL | Status: DC

## 2014-01-23 NOTE — Progress Notes (Signed)
Pre visit review using our clinic review tool, if applicable. No additional management support is needed unless otherwise documented below in the visit note. 

## 2014-01-23 NOTE — Progress Notes (Signed)
  Subjective:     Tamara Ortiz is a 73 y.o. female who presents for evaluation of sinus pain. Symptoms include: congestion, facial pain, headaches, nasal congestion, post nasal drip, sinus pressure and sore throat. Onset of symptoms was 3 days ago. Symptoms have been gradually worsening since that time. Past history is significant for no history of pneumonia or bronchitis. Patient is a non-smoker.  Pt had chemo this week as well.   The following portions of the patient's history were reviewed and updated as appropriate: allergies, current medications, past family history, past medical history, past social history, past surgical history and problem list.  Review of Systems Pertinent items are noted in HPI.   Objective:    BP 100/62  Pulse 68  Temp(Src) 98.4 F (36.9 C) (Oral)  Wt 140 lb (63.504 kg)  SpO2 98% General appearance: alert, cooperative, appears stated age and no distress Head: Normocephalic, without obvious abnormality, atraumatic Ears: normal TM's and external ear canals both ears Nose: green discharge, moderate congestion, turbinates red, swollen, sinus tenderness bilateral Throat: abnormal findings: marked oropharyngeal erythema Neck: mild anterior cervical adenopathy, supple, symmetrical, trachea midline and thyroid not enlarged, symmetric, no tenderness/mass/nodules Lungs: clear to auscultation bilaterally Heart: S1, S2 normal Extremities: extremities normal, atraumatic, no cyanosis or edema    Assessment:    Acute bacterial sinusitis.    Plan:    Nasal steroids per medication orders. Antihistamines per medication orders. Biaxin per medication orders.

## 2014-01-23 NOTE — Patient Instructions (Signed)

## 2014-02-13 ENCOUNTER — Other Ambulatory Visit: Payer: Self-pay

## 2014-02-15 ENCOUNTER — Other Ambulatory Visit: Payer: Self-pay | Admitting: Internal Medicine

## 2014-02-16 ENCOUNTER — Telehealth: Payer: Self-pay | Admitting: Internal Medicine

## 2014-02-16 ENCOUNTER — Other Ambulatory Visit (HOSPITAL_BASED_OUTPATIENT_CLINIC_OR_DEPARTMENT_OTHER): Payer: Medicare Other

## 2014-02-16 ENCOUNTER — Encounter: Payer: Self-pay | Admitting: Physician Assistant

## 2014-02-16 ENCOUNTER — Ambulatory Visit (HOSPITAL_BASED_OUTPATIENT_CLINIC_OR_DEPARTMENT_OTHER): Payer: Medicare Other

## 2014-02-16 ENCOUNTER — Ambulatory Visit (HOSPITAL_BASED_OUTPATIENT_CLINIC_OR_DEPARTMENT_OTHER): Payer: Medicare Other | Admitting: Physician Assistant

## 2014-02-16 VITALS — BP 108/76 | HR 59 | Temp 98.2°F | Resp 18 | Ht 65.0 in | Wt 140.5 lb

## 2014-02-16 DIAGNOSIS — C3410 Malignant neoplasm of upper lobe, unspecified bronchus or lung: Secondary | ICD-10-CM

## 2014-02-16 DIAGNOSIS — C7952 Secondary malignant neoplasm of bone marrow: Secondary | ICD-10-CM

## 2014-02-16 DIAGNOSIS — C3490 Malignant neoplasm of unspecified part of unspecified bronchus or lung: Secondary | ICD-10-CM

## 2014-02-16 DIAGNOSIS — C7951 Secondary malignant neoplasm of bone: Secondary | ICD-10-CM

## 2014-02-16 DIAGNOSIS — Z5111 Encounter for antineoplastic chemotherapy: Secondary | ICD-10-CM

## 2014-02-16 LAB — COMPREHENSIVE METABOLIC PANEL (CC13)
ALBUMIN: 3.6 g/dL (ref 3.5–5.0)
ALT: 15 U/L (ref 0–55)
AST: 19 U/L (ref 5–34)
Alkaline Phosphatase: 69 U/L (ref 40–150)
Anion Gap: 11 mEq/L (ref 3–11)
BUN: 12.4 mg/dL (ref 7.0–26.0)
CHLORIDE: 103 meq/L (ref 98–109)
CO2: 25 mEq/L (ref 22–29)
Calcium: 10.1 mg/dL (ref 8.4–10.4)
Creatinine: 1 mg/dL (ref 0.6–1.1)
GLUCOSE: 98 mg/dL (ref 70–140)
POTASSIUM: 4 meq/L (ref 3.5–5.1)
SODIUM: 138 meq/L (ref 136–145)
TOTAL PROTEIN: 6.6 g/dL (ref 6.4–8.3)
Total Bilirubin: 0.51 mg/dL (ref 0.20–1.20)

## 2014-02-16 LAB — CBC WITH DIFFERENTIAL/PLATELET
BASO%: 1.4 % (ref 0.0–2.0)
Basophils Absolute: 0.1 10*3/uL (ref 0.0–0.1)
EOS ABS: 0 10*3/uL (ref 0.0–0.5)
EOS%: 1 % (ref 0.0–7.0)
HCT: 38.2 % (ref 34.8–46.6)
HGB: 13 g/dL (ref 11.6–15.9)
LYMPH#: 1.4 10*3/uL (ref 0.9–3.3)
LYMPH%: 34.7 % (ref 14.0–49.7)
MCH: 32.7 pg (ref 25.1–34.0)
MCHC: 34.1 g/dL (ref 31.5–36.0)
MCV: 95.9 fL (ref 79.5–101.0)
MONO#: 0.5 10*3/uL (ref 0.1–0.9)
MONO%: 12 % (ref 0.0–14.0)
NEUT#: 2.1 10*3/uL (ref 1.5–6.5)
NEUT%: 50.9 % (ref 38.4–76.8)
Platelets: 260 10*3/uL (ref 145–400)
RBC: 3.98 10*6/uL (ref 3.70–5.45)
RDW: 13 % (ref 11.2–14.5)
WBC: 4.1 10*3/uL (ref 3.9–10.3)

## 2014-02-16 MED ORDER — DEXAMETHASONE SODIUM PHOSPHATE 10 MG/ML IJ SOLN
INTRAMUSCULAR | Status: AC
  Filled 2014-02-16: qty 1

## 2014-02-16 MED ORDER — CYANOCOBALAMIN 1000 MCG/ML IJ SOLN
INTRAMUSCULAR | Status: AC
  Filled 2014-02-16: qty 1

## 2014-02-16 MED ORDER — ZOLEDRONIC ACID 4 MG/100ML IV SOLN
4.0000 mg | Freq: Once | INTRAVENOUS | Status: AC
  Administered 2014-02-16: 4 mg via INTRAVENOUS
  Filled 2014-02-16: qty 100

## 2014-02-16 MED ORDER — ONDANSETRON 8 MG/NS 50 ML IVPB
INTRAVENOUS | Status: AC
  Filled 2014-02-16: qty 8

## 2014-02-16 MED ORDER — SODIUM CHLORIDE 0.9 % IV SOLN
500.0000 mg/m2 | Freq: Once | INTRAVENOUS | Status: AC
  Administered 2014-02-16: 875 mg via INTRAVENOUS
  Filled 2014-02-16: qty 35

## 2014-02-16 MED ORDER — ONDANSETRON 8 MG/50ML IVPB (CHCC)
8.0000 mg | Freq: Once | INTRAVENOUS | Status: AC
  Administered 2014-02-16: 8 mg via INTRAVENOUS

## 2014-02-16 MED ORDER — CYANOCOBALAMIN 1000 MCG/ML IJ SOLN
1000.0000 ug | Freq: Once | INTRAMUSCULAR | Status: DC
  Administered 2014-02-16: 1000 ug via INTRAMUSCULAR

## 2014-02-16 MED ORDER — SODIUM CHLORIDE 0.9 % IV SOLN
Freq: Once | INTRAVENOUS | Status: AC
  Administered 2014-02-16: 12:00:00 via INTRAVENOUS

## 2014-02-16 MED ORDER — DEXAMETHASONE SODIUM PHOSPHATE 10 MG/ML IJ SOLN
10.0000 mg | Freq: Once | INTRAMUSCULAR | Status: AC
  Administered 2014-02-16: 10 mg via INTRAVENOUS

## 2014-02-16 NOTE — Telephone Encounter (Signed)
gv adn printed appt sched and avs fo r pt for June and July....sed aded tx.

## 2014-02-16 NOTE — Patient Instructions (Addendum)
[facility] Discharge Instructions for Patients Receiving Chemotherapy  Today you received the following chemotherapy agents: Alimta  To help prevent nausea and vomiting after your treatment, we encourage you to take your nausea medication as prescribed.    If you develop nausea and vomiting that is not controlled by your nausea medication, call the clinic.   BELOW ARE SYMPTOMS THAT SHOULD BE REPORTED IMMEDIATELY:  *FEVER GREATER THAN 100.5 F  *CHILLS WITH OR WITHOUT FEVER  NAUSEA AND VOMITING THAT IS NOT CONTROLLED WITH YOUR NAUSEA MEDICATION  *UNUSUAL SHORTNESS OF BREATH  *UNUSUAL BRUISING OR BLEEDING  TENDERNESS IN MOUTH AND THROAT WITH OR WITHOUT PRESENCE OF ULCERS  *URINARY PROBLEMS  *BOWEL PROBLEMS  UNUSUAL RASH Items with * indicate a potential emergency and should be followed up as soon as possible.  Feel free to call the clinic you have any questions or concerns. The clinic phone number is [phone].   Zoledronic Acid injection (Hypercalcemia, Oncology) What is this medicine? ZOLEDRONIC ACID (ZOE le dron ik AS id) lowers the amount of calcium loss from bone. It is used to treat too much calcium in your blood from cancer. It is also used to prevent complications of cancer that has spread to the bone. This medicine may be used for other purposes; ask your health care provider or pharmacist if you have questions. COMMON BRAND NAME(S): Zometa What should I tell my health care provider before I take this medicine? They need to know if you have any of these conditions: -aspirin-sensitive asthma -cancer, especially if you are receiving medicines used to treat cancer -dental disease or wear dentures -infection -kidney disease -receiving corticosteroids like dexamethasone or prednisone -an unusual or allergic reaction to zoledronic acid, other medicines, foods, dyes, or preservatives -pregnant or trying to get pregnant -breast-feeding How should I  use this medicine? This medicine is for infusion into a vein. It is given by a health care professional in a hospital or clinic setting. Talk to your pediatrician regarding the use of this medicine in children. Special care may be needed. Overdosage: If you think you have taken too much of this medicine contact a poison control center or emergency room at once. NOTE: This medicine is only for you. Do not share this medicine with others. What if I miss a dose? It is important not to miss your dose. Call your doctor or health care professional if you are unable to keep an appointment. What may interact with this medicine? -certain antibiotics given by injection -NSAIDs, medicines for pain and inflammation, like ibuprofen or naproxen -some diuretics like bumetanide, furosemide -teriparatide -thalidomide This list may not describe all possible interactions. Give your health care provider a list of all the medicines, herbs, non-prescription drugs, or dietary supplements you use. Also tell them if you smoke, drink alcohol, or use illegal drugs. Some items may interact with your medicine. What should I watch for while using this medicine? Visit your doctor or health care professional for regular checkups. It may be some time before you see the benefit from this medicine. Do not stop taking your medicine unless your doctor tells you to. Your doctor may order blood tests or other tests to see how you are doing. Women should inform their doctor if they wish to become pregnant or think they might be pregnant. There is a potential for serious side effects to an unborn child. Talk to your health care professional or pharmacist for more information. You should make sure that you  get enough calcium and vitamin D while you are taking this medicine. Discuss the foods you eat and the vitamins you take with your health care professional. Some people who take this medicine have severe bone, joint, and/or muscle pain.  This medicine may also increase your risk for jaw problems or a broken thigh bone. Tell your doctor right away if you have severe pain in your jaw, bones, joints, or muscles. Tell your doctor if you have any pain that does not go away or that gets worse. Tell your dentist and dental surgeon that you are taking this medicine. You should not have major dental surgery while on this medicine. See your dentist to have a dental exam and fix any dental problems before starting this medicine. Take good care of your teeth while on this medicine. Make sure you see your dentist for regular follow-up appointments. What side effects may I notice from receiving this medicine? Side effects that you should report to your doctor or health care professional as soon as possible: -allergic reactions like skin rash, itching or hives, swelling of the face, lips, or tongue -anxiety, confusion, or depression -breathing problems -changes in vision -eye pain -feeling faint or lightheaded, falls -jaw pain, especially after dental work -mouth sores -muscle cramps, stiffness, or weakness -trouble passing urine or change in the amount of urine Side effects that usually do not require medical attention (report to your doctor or health care professional if they continue or are bothersome): -bone, joint, or muscle pain -constipation -diarrhea -fever -hair loss -irritation at site where injected -loss of appetite -nausea, vomiting -stomach upset -trouble sleeping -trouble swallowing -weak or tired This list may not describe all possible side effects. Call your doctor for medical advice about side effects. You may report side effects to FDA at 1-800-FDA-1088. Where should I keep my medicine? This drug is given in a hospital or clinic and will not be stored at home. NOTE: This sheet is a summary. It may not cover all possible information. If you have questions about this medicine, talk to your doctor, pharmacist, or  health care provider.  2014, [employer]vier/Gold Standard. (2013-02-27 13:03:13)

## 2014-02-16 NOTE — Progress Notes (Signed)
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   PRIOR THERAPY:  1. Status post left upper lobectomy with mediastinal lymph node dissection under the care of Dr. Roxan Hockey on December 10, 2007.  2. Status post 4 cycles of adjuvant chemotherapy with cisplatin and Taxotere. Last dose was given 03/22/2008.  3. Status post 6 cycles of systemic chemotherapy with carboplatin and Alimta for metastatic disease in the bone. The last dose was given July 02, 2010, with stable disease.  4. Status post 12 cycles of maintenance Alimta at 500 mg/sq m given every 3 weeks. The last dose was given 05/08/2011.  CURRENT THERAPY:  1. Maintenance Alimta at 500 mg/sq m given every 4 weeks. The patient is status post 32 cycles.  2. Zometa 4 mg IV given every 2 months for bone metastasis.   CHEMOTHERAPY INTENT: Palliative  CURRENT # OF CHEMOTHERAPY CYCLES: 33 CURRENT ANTIEMETICS: none  CURRENT SMOKING STATUS: Never smoker   ORAL CHEMOTHERAPY AND CONSENT: n/a  CURRENT BISPHOSPHONATES USE: Yes, Zometa given every 2 months  PAIN MANAGEMENT: [employer]co  NARCOTICS INDUCED CONSTIPATION: Occasional, uses MiraLax  LIVING WILL AND CODE STATUS: Has a living will   INTERVAL HISTORY: Tamara Ortiz 73 y.o. female returns to the clinic today for followup visit. The patient is feeling fine today with no specific complaints, except for increasing fatigue that lasted for a few more days after her chemotherapy. She has no chest pain, shortness of breath, cough or hemoptysis. She denied having any significant nausea or vomiting. She does report decrease in her appetite. This may be related to some  queasiness she feels intermittently. She has not tried taking her antiemetic for this queasiness. She has no fever or chills. She has no significant weight loss or night sweats.   MEDICAL HISTORY: Past Medical History  Diagnosis Date  . Hypertension   . HYPERLIPIDEMIA 11/05/2007  . ANXIETY 05/13/2009  . DEPRESSIVE DISORDER 05/06/2008  . HYPERTENSION 11/05/2007  . ALLERGIC RHINITIS 11/05/2007  . CONSTIPATION, CHRONIC 05/13/2009  . UTI 05/06/2008  . BACK PAIN 05/11/2008  . OSTEOPOROSIS 11/05/2007  . INSOMNIA-SLEEP DISORDER-UNSPEC 05/13/2009  . FATIGUE 05/13/2009  . Swelling, mass, or lump in chest 11/05/2007  . LUNG CANCER, HX OF 05/06/2008  . lung ca w/ bone mets dx'd 11/2007    lt hip and spine    ALLERGIES:  is allergic to amoxicillin.  MEDICATIONS:  Current Outpatient Prescriptions  Medication Sig Dispense Refill  . Alum & Mag Hydroxide-Simeth (MAGIC MOUTHWASH) SOLN Take 5 mLs by mouth 4 (four) times daily. Swish and spit  240 mL  0  . aspirin 81 MG EC tablet Take 81 mg by mouth daily.        . Calcium Carbonate-Vitamin D (CALCIUM-VITAMIN D) 500-200 MG-UNIT per tablet Take 1 tablet by mouth 2 (two) times daily.        . Flaxseed, Linseed, (FLAXSEED OIL) 1000 MG CAPS Take 1,000 mg by mouth daily.        . folic acid (FOLVITE) 1 MG tablet Take 1 tablet (1 mg total) by mouth daily.  90 tablet  1  . HYDROcodone-acetaminophen (NORCO) 5-325 MG per tablet Take 1 tablet by  mouth every 6 (six) hours as needed for moderate pain.  30 tablet  0  . LORazepam (ATIVAN) 0.5 MG tablet Take 1 tablet (0.5 mg total) by mouth every 8 (eight) hours as needed for anxiety.  40 tablet  1  . losartan-hydrochlorothiazide (HYZAAR) 50-12.5 MG per tablet Take 1 tablet by mouth daily.  90 tablet  3  . Multiple Vitamin (ANTIOXIDANT A/C/E PO) Take by mouth daily.        . Omega-3 350 MG CAPS Take by mouth daily.        . polyethylene glycol powder (MIRALAX) powder Take 17 g by mouth daily.        . Probiotic Product (PROBIOTIC  FORMULA) CAPS Take 1 capsule by mouth daily.        . simvastatin (ZOCOR) 40 MG tablet Take 1 tablet (40 mg total) by mouth daily.  90 tablet  3  . vitamin B-12 (CYANOCOBALAMIN) 100 MCG tablet Take 50 mcg by mouth daily.        . vitamin E ([employer] VITAMIN E) 400 UNIT capsule Take 400 Units by mouth daily.        [name] zolendronic acid (ZOMETA) 4 MG/5ML injection Inject 4 mg into the vein once.        [name] zolpidem (AMBIEN CR) 12.5 MG CR tablet Take 1 tablet (12.5 mg total) by mouth at bedtime as needed for sleep.  90 tablet  1  . fluticasone (FLONASE) 50 MCG/ACT nasal spray Place 2 sprays into both nostrils daily.  16 g  6  . ondansetron (ZOFRAN) 8 MG tablet Take 1 tablet (8 mg total) by mouth every 8 (eight) hours as needed for nausea or vomiting.  20 tablet  1   No current facility-administered medications for this visit.    SURGICAL HISTORY:  Past Surgical History  Procedure Laterality Date  . Tubal ligation    . S/p lul lung surgury  12/2007  . Lobectomy      REVIEW OF SYSTEMS:  Constitutional: positive for fatigue Eyes: negative Ears, nose, mouth, throat, and face: negative Respiratory: negative Cardiovascular: negative Gastrointestinal: positive for nausea Genitourinary:negative Integument/breast: negative Hematologic/lymphatic: negative Musculoskeletal:positive for arthralgias and back pain Neurological: negative Behavioral/Psych: negative Endocrine: negative Allergic/Immunologic: negative   PHYSICAL EXAMINATION: General appearance: alert, cooperative and no distress Head: Normocephalic, without obvious abnormality, atraumatic Neck: no adenopathy Lymph nodes: Cervical, supraclavicular, and axillary nodes normal. Resp: clear to auscultation bilaterally Back: symmetric, no curvature. ROM normal. No CVA tenderness. Cardio: regular rate and rhythm, S1, S2 normal, no murmur, click, rub or gallop GI: soft, non-tender; bowel sounds normal; no masses,  no organomegaly Extremities:  extremities normal, atraumatic, no cyanosis or edema Neurologic: Alert and oriented X 3, normal strength and tone. Normal symmetric reflexes. Normal coordination and gait  ECOG PERFORMANCE STATUS: 0 - Asymptomatic  Blood pressure 108/76, pulse 59, temperature 98.2 F (36.8 C), temperature source Oral, resp. rate 18, height '5\' 5"'  (1.651 m), weight 140 lb 8 oz (63.73 kg).  LABORATORY DATA: Lab Results  Component Value Date   WBC 4.1 02/16/2014   HGB 13.0 02/16/2014   HCT 38.2 02/16/2014   MCV 95.9 02/16/2014   PLT 260 02/16/2014      Chemistry      Component Value Date/Time   NA 138 02/16/2014 0938   NA 135 10/08/2012 1459   NA 140 04/17/2012 0855   K 4.0 02/16/2014 0938   K 3.7 10/08/2012 1459   K 4.1 04/17/2012 0855   CL  100 03/04/2013 0901   CL 96 10/08/2012 1459   CL 99 04/17/2012 0855   CO2 25 02/16/2014 0938   CO2 29 10/08/2012 1459   CO2 29 04/17/2012 0855   BUN 12.4 02/16/2014 0938   BUN 14 10/08/2012 1459   BUN 15 04/17/2012 0855   CREATININE 1.0 02/16/2014 0938   CREATININE 0.96 10/08/2012 1459   CREATININE 1.4* 04/17/2012 0855      Component Value Date/Time   CALCIUM 10.1 02/16/2014 0938   CALCIUM 9.9 10/08/2012 1459   CALCIUM 9.5 04/17/2012 0855   ALKPHOS 69 02/16/2014 0938   ALKPHOS 74 10/08/2012 1459   ALKPHOS 68 04/17/2012 0855   AST 19 02/16/2014 0938   AST 22 10/08/2012 1459   AST 29 04/17/2012 0855   ALT 15 02/16/2014 0938   ALT 17 10/08/2012 1459   ALT 31 04/17/2012 0855   BILITOT 0.51 02/16/2014 0938   BILITOT 0.2* 10/08/2012 1459   BILITOT 0.60 04/17/2012 0855       RADIOGRAPHIC STUDIES:  Ct Chest W Contrast  01/19/2014   CLINICAL DATA:  Restaging of lung cancer. Left-sided resection. Chemotherapy undergoing. Diagnosed initially in 2009.  EXAM: CT CHEST, ABDOMEN, AND PELVIS WITH CONTRAST  TECHNIQUE: Multidetector CT imaging of the chest, abdomen and pelvis was performed following the standard protocol during bolus administration of intravenous contrast.  CONTRAST:  173m OMNIPAQUE  IOHEXOL 300 MG/ML  SOLN  COMPARISON:  None.  FINDINGS:   CT CHEST FINDINGS  Lungs/Pleura: Status post left upper lobectomy. Minimal motion degradation in the midchest. Mild centrilobular emphysema. Mild left base scarring or atelectasis. No pleural fluid.  Heart/Mediastinum: No supraclavicular adenopathy. Small right axillary nodes which are not significantly changed. Mild cardiomegaly. Atherosclerosis, including within the coronary arteries. No pericardial effusion. No central pulmonary embolism, on this non-dedicated study. No mediastinal or hilar adenopathy.  Small hiatal hernia.    CT ABDOMEN AND PELVIS FINDINGS  Abdomen/Pelvis: Normal liver, spleen, distal stomach, pancreas, gallbladder, biliary tract, adrenal glands, kidneys. No retroperitoneal or retrocrural adenopathy. Scattered colonic diverticula. Normal terminal ileum. Normal small bowel without abdominal ascites. No pelvic adenopathy. Normal urinary bladder and uterus, without adnexal mass or significant free pelvic fluid.  Bones/Musculoskeletal: Multi focal sclerotic lesions. The right sacral index lesion measures 1.3 cm on image 85 and is unchanged in size. Slightly more sclerotic today. T7 vertebral body lesion is not significantly changed.    IMPRESSION: 1. Status post left upper lobectomy, without evidence of recurrent or extraosseous metastasis. 2. Similar distribution of sclerotic lesions, most consistent with metastatic disease. A right sacral lesion is minimally increased in sclerosis, possibly representing partial interval healing. 3. Small hiatal hernia. 4.  Atherosclerosis, including within the coronary arteries.   Electronically Signed   By: KAbigail MiyamotoM.D.   On: 01/19/2014 16:19    ASSESSMENT AND PLAN: This is a very pleasant 73years old white female with recurrent non-small cell lung cancer, adenocarcinoma. The patient is currently on maintenance Alimta monthly status post total of 32cycles.  Her recent CT scan of the chest,  abdomen and pelvis showed no evidence for disease progression.  Her labs been reviewed and she'll proceed with cycle #33 today as scheduled  She'll followup in 3 weeks prior to her next scheduled cycle of chemotherapy.   The patient was advised to call immediately if she has any concerning symptoms in the interval.  The patient voices understanding of current disease status and treatment options and is in agreement with the current care plan.  All questions were answered. The patient knows to call the clinic with any problems, questions or concerns. We can certainly see the patient much sooner if necessary.  Carlton Adam, PA-C   Disclaimer: This note was dictated with voice recognition software. Similar sounding words can inadvertently be transcribed and may not be corrected upon review.

## 2014-02-20 NOTE — Patient Instructions (Signed)
Try taking your nausea medication for the queasiness you're experiencing Followup in 3 weeks prior to next scheduled cycle of chemotherapy

## 2014-02-24 ENCOUNTER — Encounter: Payer: Self-pay | Admitting: Internal Medicine

## 2014-02-24 NOTE — Telephone Encounter (Signed)
To lou

## 2014-03-02 ENCOUNTER — Encounter: Payer: Self-pay | Admitting: *Deleted

## 2014-03-02 ENCOUNTER — Encounter: Payer: Medicare Other | Admitting: *Deleted

## 2014-03-03 ENCOUNTER — Encounter: Payer: Medicare Other | Admitting: *Deleted

## 2014-03-12 ENCOUNTER — Encounter: Payer: Self-pay | Admitting: *Deleted

## 2014-03-12 NOTE — Progress Notes (Signed)
03/12/14 @ 1:00 pm, [id number] Interview:  Tamara Ortiz into the [facility] for the interview.  At completion she was given a [employer] gift card [phone].  Thanked her for her participation.

## 2014-03-16 ENCOUNTER — Other Ambulatory Visit: Payer: Self-pay | Admitting: *Deleted

## 2014-03-16 ENCOUNTER — Telehealth: Payer: Self-pay | Admitting: Internal Medicine

## 2014-03-16 ENCOUNTER — Other Ambulatory Visit (HOSPITAL_BASED_OUTPATIENT_CLINIC_OR_DEPARTMENT_OTHER): Payer: Medicare Other

## 2014-03-16 ENCOUNTER — Encounter: Payer: Self-pay | Admitting: Internal Medicine

## 2014-03-16 ENCOUNTER — Ambulatory Visit (HOSPITAL_BASED_OUTPATIENT_CLINIC_OR_DEPARTMENT_OTHER): Payer: Medicare Other

## 2014-03-16 ENCOUNTER — Ambulatory Visit (HOSPITAL_BASED_OUTPATIENT_CLINIC_OR_DEPARTMENT_OTHER): Payer: Medicare Other | Admitting: Internal Medicine

## 2014-03-16 VITALS — BP 106/66 | HR 64 | Temp 98.1°F | Resp 18 | Ht 65.0 in | Wt 137.6 lb

## 2014-03-16 DIAGNOSIS — C3410 Malignant neoplasm of upper lobe, unspecified bronchus or lung: Secondary | ICD-10-CM

## 2014-03-16 DIAGNOSIS — C3490 Malignant neoplasm of unspecified part of unspecified bronchus or lung: Secondary | ICD-10-CM

## 2014-03-16 DIAGNOSIS — R5383 Other fatigue: Secondary | ICD-10-CM

## 2014-03-16 DIAGNOSIS — C7951 Secondary malignant neoplasm of bone: Secondary | ICD-10-CM

## 2014-03-16 DIAGNOSIS — Z5111 Encounter for antineoplastic chemotherapy: Secondary | ICD-10-CM

## 2014-03-16 DIAGNOSIS — R5381 Other malaise: Secondary | ICD-10-CM

## 2014-03-16 DIAGNOSIS — C7952 Secondary malignant neoplasm of bone marrow: Secondary | ICD-10-CM

## 2014-03-16 DIAGNOSIS — R630 Anorexia: Secondary | ICD-10-CM

## 2014-03-16 LAB — CBC WITH DIFFERENTIAL/PLATELET
BASO%: 1.4 % (ref 0.0–2.0)
Basophils Absolute: 0.1 10*3/uL (ref 0.0–0.1)
EOS%: 1.4 % (ref 0.0–7.0)
Eosinophils Absolute: 0.1 10*3/uL (ref 0.0–0.5)
HCT: 37.9 % (ref 34.8–46.6)
HGB: 12.8 g/dL (ref 11.6–15.9)
LYMPH%: 28.3 % (ref 14.0–49.7)
MCH: 32.6 pg (ref 25.1–34.0)
MCHC: 33.7 g/dL (ref 31.5–36.0)
MCV: 96.8 fL (ref 79.5–101.0)
MONO#: 0.5 10*3/uL (ref 0.1–0.9)
MONO%: 9.4 % (ref 0.0–14.0)
NEUT#: 3.1 10*3/uL (ref 1.5–6.5)
NEUT%: 59.5 % (ref 38.4–76.8)
Platelets: 265 10*3/uL (ref 145–400)
RBC: 3.92 10*6/uL (ref 3.70–5.45)
RDW: 12.8 % (ref 11.2–14.5)
WBC: 5.1 10*3/uL (ref 3.9–10.3)
lymph#: 1.5 10*3/uL (ref 0.9–3.3)

## 2014-03-16 LAB — COMPREHENSIVE METABOLIC PANEL (CC13)
ALK PHOS: 62 U/L (ref 40–150)
ALT: 15 U/L (ref 0–55)
AST: 18 U/L (ref 5–34)
Albumin: 3.6 g/dL (ref 3.5–5.0)
Anion Gap: 10 mEq/L (ref 3–11)
BUN: 12.8 mg/dL (ref 7.0–26.0)
CO2: 26 mEq/L (ref 22–29)
Calcium: 9.4 mg/dL (ref 8.4–10.4)
Chloride: 105 mEq/L (ref 98–109)
Creatinine: 1.1 mg/dL (ref 0.6–1.1)
Glucose: 96 mg/dl (ref 70–140)
Potassium: 3.8 mEq/L (ref 3.5–5.1)
SODIUM: 141 meq/L (ref 136–145)
Total Bilirubin: 0.4 mg/dL (ref 0.20–1.20)
Total Protein: 6.4 g/dL (ref 6.4–8.3)

## 2014-03-16 MED ORDER — CYANOCOBALAMIN 1000 MCG/ML IJ SOLN
1000.0000 ug | Freq: Once | INTRAMUSCULAR | Status: AC
  Administered 2014-03-16: 1000 ug via INTRAMUSCULAR

## 2014-03-16 MED ORDER — SODIUM CHLORIDE 0.9 % IV SOLN: Freq: Once | INTRAVENOUS | Status: DC

## 2014-03-16 MED ORDER — LORAZEPAM 0.5 MG PO TABS: 0.5000 mg | ORAL_TABLET | Freq: Three times a day (TID) | ORAL | Status: DC | PRN

## 2014-03-16 MED ORDER — HYDROCODONE-ACETAMINOPHEN 5-325 MG PO TABS: 1.0000 | ORAL_TABLET | Freq: Four times a day (QID) | ORAL | Status: DC | PRN

## 2014-03-16 MED ORDER — ONDANSETRON 8 MG/NS 50 ML IVPB
INTRAVENOUS | Status: AC
  Filled 2014-03-16: qty 8

## 2014-03-16 MED ORDER — CYANOCOBALAMIN 1000 MCG/ML IJ SOLN
INTRAMUSCULAR | Status: AC
  Filled 2014-03-16: qty 1

## 2014-03-16 MED ORDER — SODIUM CHLORIDE 0.9 % IV SOLN
Freq: Once | INTRAVENOUS | Status: AC
  Administered 2014-03-16: 09:00:00 via INTRAVENOUS

## 2014-03-16 MED ORDER — ONDANSETRON 8 MG/50ML IVPB (CHCC)
8.0000 mg | Freq: Once | INTRAVENOUS | Status: AC
  Administered 2014-03-16: 8 mg via INTRAVENOUS

## 2014-03-16 MED ORDER — DEXAMETHASONE SODIUM PHOSPHATE 10 MG/ML IJ SOLN
INTRAMUSCULAR | Status: AC
  Filled 2014-03-16: qty 1

## 2014-03-16 MED ORDER — PEMETREXED DISODIUM CHEMO INJECTION 500 MG
500.0000 mg/m2 | Freq: Once | INTRAVENOUS | Status: AC
  Administered 2014-03-16: 875 mg via INTRAVENOUS
  Filled 2014-03-16: qty 35

## 2014-03-16 MED ORDER — DEXAMETHASONE SODIUM PHOSPHATE 10 MG/ML IJ SOLN
10.0000 mg | Freq: Once | INTRAMUSCULAR | Status: AC
  Administered 2014-03-16: 10 mg via INTRAVENOUS

## 2014-03-16 NOTE — Progress Notes (Signed)
Discharged alone in no distress at 10:35.

## 2014-03-16 NOTE — Telephone Encounter (Signed)
gv and priinted appt sched and avs for pt for July and Aug...sed added tx.

## 2014-03-16 NOTE — Patient Instructions (Addendum)
[facility] Discharge Instructions for Patients Receiving Chemotherapy  Today you received the following chemotherapy agents Alimta.   To help prevent nausea and vomiting after your treatment, we encourage you to take your nausea medication Zofran 8 mg every 8 hours as needed.   If you develop nausea and vomiting that is not controlled by your nausea medication, call the clinic.   BELOW ARE SYMPTOMS THAT SHOULD BE REPORTED IMMEDIATELY:  *FEVER GREATER THAN 100.5 F  *CHILLS WITH OR WITHOUT FEVER  NAUSEA AND VOMITING THAT IS NOT CONTROLLED WITH YOUR NAUSEA MEDICATION  *UNUSUAL SHORTNESS OF BREATH  *UNUSUAL BRUISING OR BLEEDING  TENDERNESS IN MOUTH AND THROAT WITH OR WITHOUT PRESENCE OF ULCERS  *URINARY PROBLEMS  *BOWEL PROBLEMS  UNUSUAL RASH Items with * indicate a potential emergency and should be followed up as soon as possible.  Feel free to call the clinic you have any questions or concerns. The clinic phone number is [phone].

## 2014-03-16 NOTE — Progress Notes (Signed)
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   PRIOR THERAPY:  1. Status post left upper lobectomy with mediastinal lymph node dissection under the care of Dr. Roxan Hockey on December 10, 2007.  2. Status post 4 cycles of adjuvant chemotherapy with cisplatin and Taxotere. Last dose was given 03/22/2008.  3. Status post 6 cycles of systemic chemotherapy with carboplatin and Alimta for metastatic disease in the bone. The last dose was given July 02, 2010, with stable disease.  4. Status post 12 cycles of maintenance Alimta at 500 mg/sq m given every 3 weeks. The last dose was given 05/08/2011.  CURRENT THERAPY:  1. Maintenance Alimta at 500 mg/sq m given every 4 weeks. The patient is status post 33 cycles.  2. Zometa 4 mg IV given every 2 months for bone metastasis.   CHEMOTHERAPY INTENT: Palliative  CURRENT # OF CHEMOTHERAPY CYCLES: 34 CURRENT ANTIEMETICS: none  CURRENT SMOKING STATUS: Never smoker   ORAL CHEMOTHERAPY AND CONSENT: n/a  CURRENT BISPHOSPHONATES USE: Yes, Zometa given every 2 months  PAIN MANAGEMENT: [employer]  NARCOTICS INDUCED CONSTIPATION: Occasional, uses MiraLax  LIVING WILL AND CODE STATUS: Has a living will   INTERVAL HISTORY: Tamara Ortiz 73 y.o. female returns to the clinic today for followup visit. The patient is feeling fine today with no specific complaints, except for increasing fatigue and lack of appetite. She is a little bit stressed these days taken care of her son who had has hand injury. She has no chest pain, shortness of breath, cough or hemoptysis. She denied having any significant nausea or vomiting. She has no fever or  chills. She has no significant weight loss or night sweats. She is tolerating her treatment with Alimta fairly well with no significant adverse effects.  MEDICAL HISTORY: Past Medical History  Diagnosis Date  . Hypertension   . HYPERLIPIDEMIA 11/05/2007  . ANXIETY 05/13/2009  . DEPRESSIVE DISORDER 05/06/2008  . HYPERTENSION 11/05/2007  . ALLERGIC RHINITIS 11/05/2007  . CONSTIPATION, CHRONIC 05/13/2009  . UTI 05/06/2008  . BACK PAIN 05/11/2008  . OSTEOPOROSIS 11/05/2007  . INSOMNIA-SLEEP DISORDER-UNSPEC 05/13/2009  . FATIGUE 05/13/2009  . Swelling, mass, or lump in chest 11/05/2007  . LUNG CANCER, HX OF 05/06/2008  . lung ca w/ bone mets dx'd 11/2007    lt hip and spine    ALLERGIES:  is allergic to amoxicillin.  MEDICATIONS:  Current Outpatient Prescriptions  Medication Sig Dispense Refill  . Alum & Mag Hydroxide-Simeth (MAGIC MOUTHWASH) SOLN Take 5 mLs by mouth 4 (four) times daily. Swish and spit  240 mL  0  . aspirin 81 MG EC tablet Take 81 mg by mouth daily.        . Calcium Carbonate-Vitamin D (CALCIUM-VITAMIN D) 500-200 MG-UNIT per tablet Take 1 tablet by mouth 2 (two) times daily.        . Flaxseed, Linseed, (FLAXSEED OIL) 1000 MG CAPS Take 1,000 mg by mouth daily.        . fluticasone (FLONASE) 50 MCG/ACT nasal spray Place 2 sprays into both nostrils daily.  16 g  6  . folic acid (FOLVITE) 1 MG tablet Take 1 tablet (1 mg total) by mouth  daily.  90 tablet  1  . HYDROcodone-acetaminophen (NORCO) 5-325 MG per tablet Take 1 tablet by mouth every 6 (six) hours as needed for moderate pain.  30 tablet  0  . LORazepam (ATIVAN) 0.5 MG tablet Take 1 tablet (0.5 mg total) by mouth every 8 (eight) hours as needed for anxiety.  40 tablet  1  . losartan-hydrochlorothiazide (HYZAAR) 50-12.5 MG per tablet Take 1 tablet by mouth daily.  90 tablet  3  . Multiple Vitamin (ANTIOXIDANT A/C/E PO) Take by mouth daily.        . Omega-3 350 MG CAPS Take by mouth daily.        . ondansetron (ZOFRAN) 8 MG tablet Take  1 tablet (8 mg total) by mouth every 8 (eight) hours as needed for nausea or vomiting.  20 tablet  1  . polyethylene glycol powder (MIRALAX) powder Take 17 g by mouth daily.        . Probiotic Product (PROBIOTIC FORMULA) CAPS Take 1 capsule by mouth daily.        . simvastatin (ZOCOR) 40 MG tablet Take 1 tablet (40 mg total) by mouth daily.  90 tablet  3  . vitamin B-12 (CYANOCOBALAMIN) 100 MCG tablet Take 50 mcg by mouth daily.        . vitamin E ([employer]S VITAMIN E) 400 UNIT capsule Take 400 Units by mouth daily.        [name] zolendronic acid (ZOMETA) 4 MG/5ML injection Inject 4 mg into the vein once.        [name] zolpidem (AMBIEN CR) 12.5 MG CR tablet Take 1 tablet (12.5 mg total) by mouth at bedtime as needed for sleep.  90 tablet  1   No current facility-administered medications for this visit.    SURGICAL HISTORY:  Past Surgical History  Procedure Laterality Date  . Tubal ligation    . S/p lul lung surgury  12/2007  . Lobectomy      REVIEW OF SYSTEMS:  Constitutional: negative Eyes: negative Ears, nose, mouth, throat, and face: negative Respiratory: negative Cardiovascular: negative Gastrointestinal: negative Genitourinary:negative Integument/breast: negative Hematologic/lymphatic: negative Musculoskeletal:positive for arthralgias and back pain Neurological: negative Behavioral/Psych: negative Endocrine: negative Allergic/Immunologic: negative   PHYSICAL EXAMINATION: General appearance: alert, cooperative and no distress Head: Normocephalic, without obvious abnormality, atraumatic Neck: no adenopathy Lymph nodes: Cervical, supraclavicular, and axillary nodes normal. Resp: clear to auscultation bilaterally Back: symmetric, no curvature. ROM normal. No CVA tenderness. Cardio: regular rate and rhythm, S1, S2 normal, no murmur, click, rub or gallop GI: soft, non-tender; bowel sounds normal; no masses,  no organomegaly Extremities: extremities normal, atraumatic, no cyanosis or  edema Neurologic: Alert and oriented X 3, normal strength and tone. Normal symmetric reflexes. Normal coordination and gait  ECOG PERFORMANCE STATUS: 0 - Asymptomatic  Blood pressure 106/66, pulse 64, temperature 98.1 F (36.7 C), temperature source Oral, resp. rate 18, height _0  (1.651 m), weight 137 lb 9.6 oz (62.415 kg), SpO2 100.00%.  LABORATORY DATA: Lab Results  Component Value Date   WBC 5.1 03/16/2014   HGB 12.8 03/16/2014   HCT 37.9 03/16/2014   MCV 96.8 03/16/2014   PLT 265 03/16/2014      Chemistry      Component Value Date/Time   NA 138 02/16/2014 0938   NA 135 10/08/2012 1459   NA 140 04/17/2012 0855   K 4.0 02/16/2014 0938   K 3.7 10/08/2012 1459   K 4.1 04/17/2012 0855   CL 100 03/04/2013 0901  CL 96 10/08/2012 1459   CL 99 04/17/2012 0855   CO2 25 02/16/2014 0938   CO2 29 10/08/2012 1459   CO2 29 04/17/2012 0855   BUN 12.4 02/16/2014 0938   BUN 14 10/08/2012 1459   BUN 15 04/17/2012 0855   CREATININE 1.0 02/16/2014 0938   CREATININE 0.96 10/08/2012 1459   CREATININE 1.4* 04/17/2012 0855      Component Value Date/Time   CALCIUM 10.1 02/16/2014 0938   CALCIUM 9.9 10/08/2012 1459   CALCIUM 9.5 04/17/2012 0855   ALKPHOS 69 02/16/2014 0938   ALKPHOS 74 10/08/2012 1459   ALKPHOS 68 04/17/2012 0855   AST 19 02/16/2014 0938   AST 22 10/08/2012 1459   AST 29 04/17/2012 0855   ALT 15 02/16/2014 0938   ALT 17 10/08/2012 1459   ALT 31 04/17/2012 0855   BILITOT 0.51 02/16/2014 0938   BILITOT 0.2* 10/08/2012 1459   BILITOT 0.60 04/17/2012 0855       RADIOGRAPHIC STUDIES: No results found.  ASSESSMENT AND PLAN: This is a very pleasant 73 years old white female with recurrent non-small cell lung cancer, adenocarcinoma. The patient is currently on maintenance Alimta monthly status post total of 33 cycles.  The patient is feeling fine and tolerating the treatment well. I recommended for the patient to proceed with cycle #34 today as scheduled. She would come back for followup visit in one month for  evaluation before starting the next cycle of her chemotherapy after repeating CT scan of the chest, abdomen and pelvis for restaging of her disease.   The patient was advised to call immediately if she has any concerning symptoms in the interval.  The patient voices understanding of current disease status and treatment options and is in agreement with the current care plan.  All questions were answered. The patient knows to call the clinic with any problems, questions or concerns. We can certainly see the patient much sooner if necessary.  Disclaimer: This note was dictated with voice recognition software. Similar sounding words can inadvertently be transcribed and may not be corrected upon review.

## 2014-03-17 ENCOUNTER — Encounter: Payer: Self-pay | Admitting: Internal Medicine

## 2014-03-17 ENCOUNTER — Other Ambulatory Visit: Payer: Self-pay | Admitting: *Deleted

## 2014-03-17 DIAGNOSIS — C3490 Malignant neoplasm of unspecified part of unspecified bronchus or lung: Secondary | ICD-10-CM

## 2014-03-18 ENCOUNTER — Telehealth: Payer: Self-pay

## 2014-03-18 NOTE — Telephone Encounter (Signed)
Completed PA for Zolpidem SR 12.5 tabs from 03/17/14 through 03/18/15.  Patient and pharmacy has been informed.  Customer ID #[id number] LMBEM [id number].

## 2014-03-19 ENCOUNTER — Telehealth: Payer: Self-pay | Admitting: Internal Medicine

## 2014-03-19 NOTE — Telephone Encounter (Signed)
lvm for pt regarding to June appt....pt already aware of July and Aug appt.Marland KitchenMarland Kitchen

## 2014-03-23 ENCOUNTER — Encounter: Payer: Self-pay | Admitting: Internal Medicine

## 2014-03-23 NOTE — Progress Notes (Signed)
Called and spoke to the patient regarding nausea, vomiting, no appetite for the past few days.  Per Dr Vista Mink, based on how she has tolerated alimta in the past this is probably a GI bug.  Advised to push fluids and take her nausea medications.  Offered fluids to pt, she declined at this time but will let us know if she feels she may need fluids another day.  SLJ

## 2014-03-24 ENCOUNTER — Ambulatory Visit: Payer: Medicare Other | Admitting: Nutrition

## 2014-03-24 NOTE — Progress Notes (Signed)
Patient is a 73 year old female diagnosed with lung cancer in 2009.  She has bone metastases.  She is a patient of Dr. Earlie Server.  Past medical history includes hypertension, hyperlipidemia, anxiety, depression, constipation, and fatigue.  Medications include, multivitamin, Zofran, MiraLax, Zocor, vitamin E, Zometa, Magic mouthwash, and calcium with vitamin D.  Labs were reviewed.  Height: 65 inches. Weight: 137.6 pounds. Usual body weight: 140 pounds. BMI: 22.9.  Patient reports she has recently experienced decreased appetite and taste alterations.  She denies nausea, vomiting, constipation or diarrhea.  She tries to follow a low sugar diet.  She typically enjoys vegetables, and fruits.  States she thinks she has a" GI bug".  Nutrition diagnosis: Inadequate oral intake related to poor appetite and taste alterations as evidenced by 3 pound weight loss from usual body weight.  Intervention: Patient educated on strategies for improving taste of food.  Recommended patient choose smaller, more frequent meals and snacks concentrating on cold protein foods.  Recommended patient add oral nutrition supplement such as Unjury protein powder or Carnation breakfast essentials to be mixed with her Almond milk.  Provided samples and fact sheets.  Questions were answered.  Teach back method used.  Monitoring, evaluation, goals: Patient will tolerate adequate calories and protein to minimize weight loss.  Next visit: Patient will call me with questions or concerns.  Will followup in chemotherapy on Monday, July 13.

## 2014-04-10 ENCOUNTER — Telehealth: Payer: Self-pay | Admitting: *Deleted

## 2014-04-10 ENCOUNTER — Ambulatory Visit (HOSPITAL_COMMUNITY): Payer: Medicare Other

## 2014-04-10 NOTE — Telephone Encounter (Signed)
Patient called stating that she could not make her CT scan today because her son was in the ED with a kidney stone.  Gave her the # to central scheduling to reschedule appointment.  Will inform Dr Vista Mink.  F/u with [facility]  04/14/14.

## 2014-04-11 ENCOUNTER — Encounter: Payer: Self-pay | Admitting: Family Medicine

## 2014-04-11 ENCOUNTER — Ambulatory Visit (INDEPENDENT_AMBULATORY_CARE_PROVIDER_SITE_OTHER): Payer: Medicare Other | Admitting: Family Medicine

## 2014-04-11 VITALS — BP 134/70 | HR 53 | Temp 97.7°F | Wt 136.8 lb

## 2014-04-11 DIAGNOSIS — R112 Nausea with vomiting, unspecified: Secondary | ICD-10-CM

## 2014-04-11 MED ORDER — AZITHROMYCIN 250 MG PO TABS: ORAL_TABLET | ORAL | Status: DC

## 2014-04-11 NOTE — Progress Notes (Signed)
Pre visit review using our clinic review tool, if applicable. No additional management support is needed unless otherwise documented below in the visit note.  She has been caring for her son, recently he had a renal stone and surgery.  She had lung cancer and is on maintenance chemo, due for chemo this upcoming week.  In the last day, she has had mult episodes of vomiting.  No blood in vomit, it was yellowish mucous, not food.  No fevers. No diarrhea.  Hasn't taking zofran recently, usually only takes with chemo.  She has some zofran at home.  No ear pain.  No ST.  No rhinorrhea.  Some facial pain, at the sinuses B.  Not much known post nasal gtt.  No cough.  No changes in diet.  Sig stress caring for her son.  Appetite is decreased over the last month.    Meds, vitals, and allergies reviewed.   ROS: See HPI.  Otherwise, noncontributory.  GEN: nad, alert and oriented HEENT: mucous membranes moist, tm w/o erythema, nasal exam w/o erythema, scant clear discharge noted,  OP with mild cobblestoning, max > frontal sinuses ttp B NECK: supple w/o LA CV: rrr.   PULM: ctab, no inc wob ABD: soft, minimally ttp at the umbilicus but no rebound, normal BS.  EXT: no edema

## 2014-04-11 NOTE — Patient Instructions (Signed)
Start the zithromax today and use the zofran for nausea and vomiting.  Drink sips of fluids in the meantime and update your primary doctor on Monday.  Take care.

## 2014-04-11 NOTE — Assessment & Plan Note (Signed)
Nontoxic, no RUQ pain, no jaundice.  D/w pt.  She could have a developing sinusitis and resulting GI upset, on top of chronic conditions.  Would use zofran prn now and start zmax.  She agrees.  Will have her f/u with PCP as needed.  Okay for outpatient f/u.

## 2014-04-13 ENCOUNTER — Other Ambulatory Visit (HOSPITAL_BASED_OUTPATIENT_CLINIC_OR_DEPARTMENT_OTHER): Payer: Medicare Other

## 2014-04-13 ENCOUNTER — Ambulatory Visit: Payer: Medicare Other

## 2014-04-13 ENCOUNTER — Other Ambulatory Visit: Payer: Medicare Other

## 2014-04-13 ENCOUNTER — Telehealth: Payer: Self-pay | Admitting: Internal Medicine

## 2014-04-13 ENCOUNTER — Ambulatory Visit (HOSPITAL_BASED_OUTPATIENT_CLINIC_OR_DEPARTMENT_OTHER): Payer: Medicare Other

## 2014-04-13 ENCOUNTER — Encounter: Payer: Self-pay | Admitting: Internal Medicine

## 2014-04-13 ENCOUNTER — Ambulatory Visit (HOSPITAL_BASED_OUTPATIENT_CLINIC_OR_DEPARTMENT_OTHER): Payer: Medicare Other | Admitting: Internal Medicine

## 2014-04-13 ENCOUNTER — Ambulatory Visit: Payer: Medicare Other | Admitting: Nutrition

## 2014-04-13 VITALS — BP 132/71 | HR 57 | Temp 98.3°F | Resp 19 | Ht 65.0 in | Wt 135.8 lb

## 2014-04-13 DIAGNOSIS — C3490 Malignant neoplasm of unspecified part of unspecified bronchus or lung: Secondary | ICD-10-CM

## 2014-04-13 DIAGNOSIS — C7952 Secondary malignant neoplasm of bone marrow: Secondary | ICD-10-CM

## 2014-04-13 DIAGNOSIS — C7951 Secondary malignant neoplasm of bone: Secondary | ICD-10-CM

## 2014-04-13 DIAGNOSIS — C3410 Malignant neoplasm of upper lobe, unspecified bronchus or lung: Secondary | ICD-10-CM

## 2014-04-13 DIAGNOSIS — Z5111 Encounter for antineoplastic chemotherapy: Secondary | ICD-10-CM

## 2014-04-13 LAB — CBC WITH DIFFERENTIAL/PLATELET
BASO%: 1.1 % (ref 0.0–2.0)
Basophils Absolute: 0 10*3/uL (ref 0.0–0.1)
EOS%: 0.6 % (ref 0.0–7.0)
Eosinophils Absolute: 0 10*3/uL (ref 0.0–0.5)
HEMATOCRIT: 37.4 % (ref 34.8–46.6)
HGB: 12.5 g/dL (ref 11.6–15.9)
LYMPH%: 29 % (ref 14.0–49.7)
MCH: 32.6 pg (ref 25.1–34.0)
MCHC: 33.6 g/dL (ref 31.5–36.0)
MCV: 97.1 fL (ref 79.5–101.0)
MONO#: 0.5 10*3/uL (ref 0.1–0.9)
MONO%: 10.7 % (ref 0.0–14.0)
NEUT#: 2.6 10*3/uL (ref 1.5–6.5)
NEUT%: 58.6 % (ref 38.4–76.8)
PLATELETS: 292 10*3/uL (ref 145–400)
RBC: 3.85 10*6/uL (ref 3.70–5.45)
RDW: 13.6 % (ref 11.2–14.5)
WBC: 4.5 10*3/uL (ref 3.9–10.3)
lymph#: 1.3 10*3/uL (ref 0.9–3.3)

## 2014-04-13 LAB — COMPREHENSIVE METABOLIC PANEL (CC13)
ALK PHOS: 58 U/L (ref 40–150)
ALT: 21 U/L (ref 0–55)
AST: 23 U/L (ref 5–34)
Albumin: 3.6 g/dL (ref 3.5–5.0)
Anion Gap: 7 mEq/L (ref 3–11)
BUN: 10.1 mg/dL (ref 7.0–26.0)
CO2: 28 mEq/L (ref 22–29)
Calcium: 10 mg/dL (ref 8.4–10.4)
Chloride: 103 mEq/L (ref 98–109)
Creatinine: 1.1 mg/dL (ref 0.6–1.1)
Glucose: 96 mg/dl (ref 70–140)
POTASSIUM: 3.7 meq/L (ref 3.5–5.1)
SODIUM: 138 meq/L (ref 136–145)
TOTAL PROTEIN: 6.6 g/dL (ref 6.4–8.3)
Total Bilirubin: 0.44 mg/dL (ref 0.20–1.20)

## 2014-04-13 MED ORDER — SODIUM CHLORIDE 0.9 % IV SOLN
Freq: Once | INTRAVENOUS | Status: AC
  Administered 2014-04-13: 13:00:00 via INTRAVENOUS

## 2014-04-13 MED ORDER — ZOLEDRONIC ACID 4 MG/100ML IV SOLN
4.0000 mg | Freq: Once | INTRAVENOUS | Status: AC
  Administered 2014-04-13: 4 mg via INTRAVENOUS
  Filled 2014-04-13: qty 100

## 2014-04-13 MED ORDER — DEXAMETHASONE SODIUM PHOSPHATE 10 MG/ML IJ SOLN
INTRAMUSCULAR | Status: AC
  Filled 2014-04-13: qty 1

## 2014-04-13 MED ORDER — SODIUM CHLORIDE 0.9 % IV SOLN
500.0000 mg/m2 | Freq: Once | INTRAVENOUS | Status: AC
  Administered 2014-04-13: 875 mg via INTRAVENOUS
  Filled 2014-04-13: qty 35

## 2014-04-13 MED ORDER — ONDANSETRON 8 MG/NS 50 ML IVPB
INTRAVENOUS | Status: AC
  Filled 2014-04-13: qty 8

## 2014-04-13 MED ORDER — ONDANSETRON 8 MG/50ML IVPB (CHCC)
8.0000 mg | Freq: Once | INTRAVENOUS | Status: AC
  Administered 2014-04-13: 8 mg via INTRAVENOUS

## 2014-04-13 MED ORDER — DEXAMETHASONE SODIUM PHOSPHATE 10 MG/ML IJ SOLN
10.0000 mg | Freq: Once | INTRAMUSCULAR | Status: AC
  Administered 2014-04-13: 10 mg via INTRAVENOUS

## 2014-04-13 NOTE — Telephone Encounter (Signed)
gv and printed appt sched and avs for  pt for July Aug adn Sept....sed added tx.

## 2014-04-13 NOTE — Progress Notes (Signed)
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   PRIOR THERAPY:  1. Status post left upper lobectomy with mediastinal lymph node dissection under the care of Dr. Roxan Hockey on December 10, 2007.  2. Status post 4 cycles of adjuvant chemotherapy with cisplatin and Taxotere. Last dose was given 03/22/2008.  3. Status post 6 cycles of systemic chemotherapy with carboplatin and Alimta for metastatic disease in the bone. The last dose was given July 02, 2010, with stable disease.  4. Status post 12 cycles of maintenance Alimta at 500 mg/sq m given every 3 weeks. The last dose was given 05/08/2011.  CURRENT THERAPY:  1. Maintenance Alimta at 500 mg/sq m given every 4 weeks. The patient is status post 34 cycles.  2. Zometa 4 mg IV given every 2 months for bone metastasis.   CHEMOTHERAPY INTENT: Palliative  CURRENT # OF CHEMOTHERAPY CYCLES: 35 CURRENT ANTIEMETICS: none  CURRENT SMOKING STATUS: Never smoker   ORAL CHEMOTHERAPY AND CONSENT: n/a  CURRENT BISPHOSPHONATES USE: Yes, Zometa given every 2 months  PAIN MANAGEMENT: [employer]  NARCOTICS INDUCED CONSTIPATION: Occasional, uses MiraLax  LIVING WILL AND CODE STATUS: Has a living will   INTERVAL HISTORY: Tamara Ortiz 73 y.o. female returns to the clinic today for followup visit. The patient is feeling fine today with no specific complaints.  She has no chest pain, shortness of breath, cough or hemoptysis. She denied having any significant nausea or vomiting. She has no fever or chills. She has no significant weight loss or night sweats. She is tolerating her treatment with Alimta fairly well with no significant  adverse effects. She missed her recent appointment for the restaging CT scans because she was admitted emergency Department with her son and his scan is rescheduled for tomorrow.  MEDICAL HISTORY: Past Medical History  Diagnosis Date  . Hypertension   . HYPERLIPIDEMIA 11/05/2007  . ANXIETY 05/13/2009  . DEPRESSIVE DISORDER 05/06/2008  . HYPERTENSION 11/05/2007  . ALLERGIC RHINITIS 11/05/2007  . CONSTIPATION, CHRONIC 05/13/2009  . UTI 05/06/2008  . BACK PAIN 05/11/2008  . OSTEOPOROSIS 11/05/2007  . INSOMNIA-SLEEP DISORDER-UNSPEC 05/13/2009  . FATIGUE 05/13/2009  . Swelling, mass, or lump in chest 11/05/2007  . LUNG CANCER, HX OF 05/06/2008  . lung ca w/ bone mets dx'd 11/2007    lt hip and spine    ALLERGIES:  is allergic to amoxicillin.  MEDICATIONS:  Current Outpatient Prescriptions  Medication Sig Dispense Refill  . Alum & Mag Hydroxide-Simeth (MAGIC MOUTHWASH) SOLN Take 5 mLs by mouth 4 (four) times daily. Swish and spit  240 mL  0  . aspirin 81 MG EC tablet Take 81 mg by mouth daily.        [name] azithromycin (ZITHROMAX) 250 MG tablet 2 tabs a day for 1 day and then 1 a day for 4 days.  6 each  0  . Calcium Carbonate-Vitamin D (CALCIUM-VITAMIN D) 500-200 MG-UNIT per tablet Take 1 tablet by mouth 2 (two) times daily.        . Flaxseed, Linseed, (FLAXSEED OIL) 1000 MG CAPS Take 1,000 mg by mouth daily.        . folic acid (FOLVITE) 1 MG tablet  Take 1 tablet (1 mg total) by mouth daily.  90 tablet  1  . HYDROcodone-acetaminophen (NORCO) 5-325 MG per tablet Take 1 tablet by mouth every 6 (six) hours as needed for moderate pain.  30 tablet  0  . LORazepam (ATIVAN) 0.5 MG tablet Take 1 tablet (0.5 mg total) by mouth every 8 (eight) hours as needed for anxiety.  40 tablet  1  . losartan-hydrochlorothiazide (HYZAAR) 50-12.5 MG per tablet Take 1 tablet by mouth daily.  90 tablet  3  . Multiple Vitamin (ANTIOXIDANT A/C/E PO) Take by mouth daily.        . Omega-3 350 MG CAPS Take by mouth daily.        .  ondansetron (ZOFRAN) 8 MG tablet Take 1 tablet (8 mg total) by mouth every 8 (eight) hours as needed for nausea or vomiting.  20 tablet  1  . polyethylene glycol powder (MIRALAX) powder Take 17 g by mouth daily.        . Probiotic Product (PROBIOTIC FORMULA) CAPS Take 1 capsule by mouth daily.        . simvastatin (ZOCOR) 40 MG tablet Take 1 tablet (40 mg total) by mouth daily.  90 tablet  3  . vitamin B-12 (CYANOCOBALAMIN) 100 MCG tablet Take 50 mcg by mouth daily.        . vitamin E (CVS VITAMIN E) 400 UNIT capsule Take 400 Units by mouth daily.        [name] zolendronic acid (ZOMETA) 4 MG/5ML injection Inject 4 mg into the vein once.        [name] zolpidem (AMBIEN CR) 12.5 MG CR tablet Take 1 tablet (12.5 mg total) by mouth at bedtime as needed for sleep.  90 tablet  1   No current facility-administered medications for this visit.    SURGICAL HISTORY:  Past Surgical History  Procedure Laterality Date  . Tubal ligation    . S/p lul lung surgury  12/2007  . Lobectomy      REVIEW OF SYSTEMS:  Constitutional: negative Eyes: negative Ears, nose, mouth, throat, and face: negative Respiratory: negative Cardiovascular: negative Gastrointestinal: negative Genitourinary:negative Integument/breast: negative Hematologic/lymphatic: negative Musculoskeletal:positive for arthralgias and back pain Neurological: negative Behavioral/Psych: negative Endocrine: negative Allergic/Immunologic: negative   PHYSICAL EXAMINATION: General appearance: alert, cooperative and no distress Head: Normocephalic, without obvious abnormality, atraumatic Neck: no adenopathy Lymph nodes: Cervical, supraclavicular, and axillary nodes normal. Resp: clear to auscultation bilaterally Back: symmetric, no curvature. ROM normal. No CVA tenderness. Cardio: regular rate and rhythm, S1, S2 normal, no murmur, click, rub or gallop GI: soft, non-tender; bowel sounds normal; no masses,  no organomegaly Extremities: extremities  normal, atraumatic, no cyanosis or edema Neurologic: Alert and oriented X 3, normal strength and tone. Normal symmetric reflexes. Normal coordination and gait  ECOG PERFORMANCE STATUS: 0 - Asymptomatic  Blood pressure 132/71, pulse 57, temperature 98.3 F (36.8 C), temperature source Oral, resp. rate 19, height _0  (1.651 m), weight 135 lb 12.8 oz (61.598 kg).  LABORATORY DATA: Lab Results  Component Value Date   WBC 4.5 04/13/2014   HGB 12.5 04/13/2014   HCT 37.4 04/13/2014   MCV 97.1 04/13/2014   PLT 292 04/13/2014      Chemistry      Component Value Date/Time   NA 138 04/13/2014 1019   NA 135 10/08/2012 1459   NA 140 04/17/2012 0855   K 3.7 04/13/2014 1019   K 3.7 10/08/2012 1459   K 4.1 04/17/2012 0855  CL 100 03/04/2013 0901   CL 96 10/08/2012 1459   CL 99 04/17/2012 0855   CO2 28 04/13/2014 1019   CO2 29 10/08/2012 1459   CO2 29 04/17/2012 0855   BUN 10.1 04/13/2014 1019   BUN 14 10/08/2012 1459   BUN 15 04/17/2012 0855   CREATININE 1.1 04/13/2014 1019   CREATININE 0.96 10/08/2012 1459   CREATININE 1.4* 04/17/2012 0855      Component Value Date/Time   CALCIUM 10.0 04/13/2014 1019   CALCIUM 9.9 10/08/2012 1459   CALCIUM 9.5 04/17/2012 0855   ALKPHOS 58 04/13/2014 1019   ALKPHOS 74 10/08/2012 1459   ALKPHOS 68 04/17/2012 0855   AST 23 04/13/2014 1019   AST 22 10/08/2012 1459   AST 29 04/17/2012 0855   ALT 21 04/13/2014 1019   ALT 17 10/08/2012 1459   ALT 31 04/17/2012 0855   BILITOT 0.44 04/13/2014 1019   BILITOT 0.2* 10/08/2012 1459   BILITOT 0.60 04/17/2012 0855       RADIOGRAPHIC STUDIES:  ASSESSMENT AND PLAN: This is a very pleasant 72 years old white female with recurrent non-small cell lung cancer, adenocarcinoma. The patient is currently on maintenance Alimta monthly status post total of 33 cycles.  The patient is feeling fine and tolerating the treatment well. I recommended for the patient to proceed with cycle #35 today as scheduled. Her CT scan of the chest, abdomen and pelvis is  scheduled for tomorrow and I will call her with the results. She would come back for followup visit in one month for evaluation before starting the next cycle of her chemotherapy as it is no evidence for disease. The patient was advised to call immediately if she has any concerning symptoms in the interval.  The patient voices understanding of current disease status and treatment options and is in agreement with the current care plan.  All questions were answered. The patient knows to call the clinic with any problems, questions or concerns. We can certainly see the patient much sooner if necessary.  Disclaimer: This note was dictated with voice recognition software. Similar sounding words can inadvertently be transcribed and may not be corrected upon review.

## 2014-04-13 NOTE — Progress Notes (Signed)
Patient's first IV site infiltrated after premeds (zofran and decadron) were given and patient was on saline flush. RN noticed IV site was red and swollen. Patient states "site feels tight." IV immediatly removed and heat applied to area. Upon discharge, swelling almost completely gone and patient had no complaints. Patient told to call [facility] if site were to become red, painful or sore. Patient agreeable to this. Cindi Carbon, RN

## 2014-04-13 NOTE — Progress Notes (Signed)
Nutrition followup completed with patient receiving treatment for lung cancer and bone metastases.  Patient reports she is feeling" fair".  Patient states she is under a lot of emotional stress with son's illness.  Weight down several pounds and documented as 135.8 pounds July 13 from 137.6 pounds June 15.  Patient has a usual body weight 140 pounds.  She complains of taste alterations.  She has a poor appetite.  Nutrition diagnosis: Inadequate oral intake continues.  Intervention: Recommended patient increase Carnation breakfast essentials to a minimum of one daily to provide additional calories and protein.  Recommended patient try strategies for improving taste of food.  Encouraged patient to add calories to foods. Reviewed strategies for taste alterations.  Questions were answered.  Teach back method used.  Monitoring, evaluation, goals: Patient will increase Carnation breakfast essentials to once daily, to support weight maintenance.  Next visit: Monday, August 10, during chemotherapy.    **Disclaimer: This note was dictated with voice recognition software. Similar sounding words can inadvertently be transcribed and this note may contain transcription errors which may not have been corrected upon publication of note.**

## 2014-04-14 ENCOUNTER — Ambulatory Visit (HOSPITAL_COMMUNITY)
Admission: RE | Admit: 2014-04-14 | Discharge: 2014-04-14 | Disposition: A | Discharge status: Home / Self Care | Payer: Medicare Other | Source: Ambulatory Visit | Attending: Internal Medicine | Admitting: Internal Medicine

## 2014-04-14 ENCOUNTER — Encounter (HOSPITAL_COMMUNITY): Payer: Self-pay

## 2014-04-14 DIAGNOSIS — Z9221 Personal history of antineoplastic chemotherapy: Secondary | ICD-10-CM | POA: Insufficient documentation

## 2014-04-14 DIAGNOSIS — K449 Diaphragmatic hernia without obstruction or gangrene: Secondary | ICD-10-CM | POA: Insufficient documentation

## 2014-04-14 DIAGNOSIS — C3490 Malignant neoplasm of unspecified part of unspecified bronchus or lung: Secondary | ICD-10-CM | POA: Insufficient documentation

## 2014-04-14 DIAGNOSIS — C7952 Secondary malignant neoplasm of bone marrow: Secondary | ICD-10-CM

## 2014-04-14 DIAGNOSIS — R630 Anorexia: Secondary | ICD-10-CM | POA: Insufficient documentation

## 2014-04-14 DIAGNOSIS — J984 Other disorders of lung: Secondary | ICD-10-CM | POA: Insufficient documentation

## 2014-04-14 DIAGNOSIS — C7951 Secondary malignant neoplasm of bone: Secondary | ICD-10-CM | POA: Insufficient documentation

## 2014-04-14 MED ORDER — IOHEXOL 300 MG/ML  SOLN
100.0000 mL | Freq: Once | INTRAMUSCULAR | Status: AC | PRN
  Administered 2014-04-14: 100 mL via INTRAVENOUS

## 2014-04-16 ENCOUNTER — Ambulatory Visit (HOSPITAL_COMMUNITY): Payer: Medicare Other

## 2014-04-16 ENCOUNTER — Encounter: Payer: Self-pay | Admitting: Internal Medicine

## 2014-04-16 NOTE — Telephone Encounter (Signed)
Called patient as authorized by Dr. Julien Nordmann.  Notified her the CT Chest/Abdomen and pelvis showed no new metastatic disease and thoracic spine is stable.

## 2014-04-20 ENCOUNTER — Telehealth: Payer: Self-pay | Admitting: Medical Oncology

## 2014-04-20 NOTE — Telephone Encounter (Signed)
Pt reports she had chemo last week and has not felt as good as in past after treatment. She has decrease in appetite and has lost a few pounds. For the first time ever she could not complete her aerobics class last week due to weakness. She complains of burping a lot. I told her she may have contracted a virus and that I will notify [facility].

## 2014-04-23 ENCOUNTER — Telehealth: Payer: Self-pay | Admitting: Medical Oncology

## 2014-04-23 NOTE — Telephone Encounter (Signed)
I encouraged increased hydration . She said she is taking 60 oz of fluid/day . She  went out  to eat with friends to a steakhouse and she ate spinach dip. She said she wants to take a break from chemo in sept. I told her to discuss with [facility] in [place].

## 2014-04-23 NOTE — Telephone Encounter (Signed)
Message copied by Ardeen Garland on Thu Apr 23, 2014  2:49 PM ------      Message from: Curt Bears      Created: Mon Apr 20, 2014  7:19 PM       No Just encourage Hydration      ----- Message -----         From: Ardeen Garland, RN         Sent: 04/20/2014   4:12 PM           To: Curt Bears, MD            Pt reports she had chemo last week and has not felt as good as in past after treatment. She has decrease in appetite and has lost a few pounds. For the first time ever she could not complete her aerobics class last week due to weakness. She complains of burping a lot. I told her she may have contracted a virus and that I will notify [facility].            Is there anything else I need to do?             ------

## 2014-05-01 ENCOUNTER — Telehealth: Payer: Self-pay | Admitting: Medical Oncology

## 2014-05-01 NOTE — Telephone Encounter (Signed)
appts changed per pt request

## 2014-05-11 ENCOUNTER — Ambulatory Visit: Payer: Medicare Other | Admitting: Physician Assistant

## 2014-05-11 ENCOUNTER — Ambulatory Visit: Payer: Medicare Other

## 2014-05-11 ENCOUNTER — Ambulatory Visit (HOSPITAL_BASED_OUTPATIENT_CLINIC_OR_DEPARTMENT_OTHER): Payer: Medicare Other

## 2014-05-11 ENCOUNTER — Other Ambulatory Visit: Payer: Medicare Other

## 2014-05-11 ENCOUNTER — Encounter: Payer: Self-pay | Admitting: Physician Assistant

## 2014-05-11 ENCOUNTER — Ambulatory Visit (HOSPITAL_BASED_OUTPATIENT_CLINIC_OR_DEPARTMENT_OTHER): Payer: Medicare Other | Admitting: Physician Assistant

## 2014-05-11 ENCOUNTER — Encounter: Payer: Medicare Other | Admitting: Nutrition

## 2014-05-11 VITALS — BP 114/63 | HR 66 | Temp 98.8°F | Resp 18 | Ht 65.0 in | Wt 136.1 lb

## 2014-05-11 DIAGNOSIS — C3490 Malignant neoplasm of unspecified part of unspecified bronchus or lung: Secondary | ICD-10-CM

## 2014-05-11 DIAGNOSIS — C7951 Secondary malignant neoplasm of bone: Secondary | ICD-10-CM

## 2014-05-11 DIAGNOSIS — C7952 Secondary malignant neoplasm of bone marrow: Secondary | ICD-10-CM

## 2014-05-11 DIAGNOSIS — C3410 Malignant neoplasm of upper lobe, unspecified bronchus or lung: Secondary | ICD-10-CM

## 2014-05-11 DIAGNOSIS — Z5111 Encounter for antineoplastic chemotherapy: Secondary | ICD-10-CM

## 2014-05-11 LAB — CBC WITH DIFFERENTIAL/PLATELET
BASO%: 0.7 % (ref 0.0–2.0)
BASOS ABS: 0 10*3/uL (ref 0.0–0.1)
EOS%: 0.2 % (ref 0.0–7.0)
Eosinophils Absolute: 0 10*3/uL (ref 0.0–0.5)
HEMATOCRIT: 36.1 % (ref 34.8–46.6)
HEMOGLOBIN: 12.4 g/dL (ref 11.6–15.9)
LYMPH%: 33.2 % (ref 14.0–49.7)
MCH: 33.2 pg (ref 25.1–34.0)
MCHC: 34.3 g/dL (ref 31.5–36.0)
MCV: 96.5 fL (ref 79.5–101.0)
MONO#: 0.5 10*3/uL (ref 0.1–0.9)
MONO%: 10.2 % (ref 0.0–14.0)
NEUT#: 2.6 10*3/uL (ref 1.5–6.5)
NEUT%: 55.7 % (ref 38.4–76.8)
PLATELETS: 253 10*3/uL (ref 145–400)
RBC: 3.74 10*6/uL (ref 3.70–5.45)
RDW: 13.1 % (ref 11.2–14.5)
WBC: 4.6 10*3/uL (ref 3.9–10.3)
lymph#: 1.5 10*3/uL (ref 0.9–3.3)

## 2014-05-11 LAB — COMPREHENSIVE METABOLIC PANEL
ALK PHOS: 68 U/L (ref 39–117)
ALT: 15 U/L (ref 0–35)
AST: 21 U/L (ref 0–37)
Albumin: 3.7 g/dL (ref 3.5–5.2)
BILIRUBIN TOTAL: 0.3 mg/dL (ref 0.2–1.2)
BUN: 13 mg/dL (ref 6–23)
CO2: 25 mEq/L (ref 19–32)
CREATININE: 1.08 mg/dL (ref 0.50–1.10)
Calcium: 9.5 mg/dL (ref 8.4–10.5)
Chloride: 100 mEq/L (ref 96–112)
Glucose, Bld: 118 mg/dL — ABNORMAL HIGH (ref 70–99)
Potassium: 3.7 mEq/L (ref 3.5–5.3)
Sodium: 138 mEq/L (ref 135–145)
Total Protein: 6.9 g/dL (ref 6.0–8.3)

## 2014-05-11 MED ORDER — DEXAMETHASONE SODIUM PHOSPHATE 10 MG/ML IJ SOLN
10.0000 mg | Freq: Once | INTRAMUSCULAR | Status: AC
  Administered 2014-05-11: 10 mg via INTRAVENOUS

## 2014-05-11 MED ORDER — PEMETREXED DISODIUM CHEMO INJECTION 500 MG
500.0000 mg/m2 | Freq: Once | INTRAVENOUS | Status: AC
  Administered 2014-05-11: 875 mg via INTRAVENOUS
  Filled 2014-05-11: qty 35

## 2014-05-11 MED ORDER — DEXAMETHASONE SODIUM PHOSPHATE 10 MG/ML IJ SOLN
INTRAMUSCULAR | Status: AC
  Filled 2014-05-11: qty 1

## 2014-05-11 MED ORDER — ONDANSETRON 8 MG/50ML IVPB (CHCC)
8.0000 mg | Freq: Once | INTRAVENOUS | Status: AC
  Administered 2014-05-11: 8 mg via INTRAVENOUS

## 2014-05-11 MED ORDER — ONDANSETRON 8 MG/NS 50 ML IVPB
INTRAVENOUS | Status: AC
  Filled 2014-05-11: qty 8

## 2014-05-11 NOTE — Progress Notes (Signed)
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   PRIOR THERAPY:  1. Status post left upper lobectomy with mediastinal lymph node dissection under the care of Dr. Roxan Hockey on December 10, 2007.  2. Status post 4 cycles of adjuvant chemotherapy with cisplatin and Taxotere. Last dose was given 03/22/2008.  3. Status post 6 cycles of systemic chemotherapy with carboplatin and Alimta for metastatic disease in the bone. The last dose was given July 02, 2010, with stable disease.  4. Status post 12 cycles of maintenance Alimta at 500 mg/sq m given every 3 weeks. The last dose was given 05/08/2011.  CURRENT THERAPY:  1. Maintenance Alimta at 500 mg/sq m given every 4 weeks. The patient is status post 35 cycles.  2. Zometa 4 mg IV given every 2 months for bone metastasis.   CHEMOTHERAPY INTENT: Palliative  CURRENT # OF CHEMOTHERAPY CYCLES: 36 CURRENT ANTIEMETICS: none  CURRENT SMOKING STATUS: Never smoker   ORAL CHEMOTHERAPY AND CONSENT: n/a  CURRENT BISPHOSPHONATES USE: Yes, Zometa given every 2 months  PAIN MANAGEMENT: Norco  NARCOTICS INDUCED CONSTIPATION: Occasional, uses MiraLax  LIVING WILL AND CODE STATUS: Has a living will   INTERVAL HISTORY: Tamara Ortiz 73 y.o. female returns to the clinic today for followup visit. She reports increased emotional stressors that are taking their toll on her physically. Her son was injured and has been unable to work. She has been supporting him physically and financially. She has no chest pain, shortness of breath, cough or hemoptysis. She denied having any significant nausea or vomiting. She has no fever or chills. She  has no significant weight loss or night sweats. She is tolerating her treatment with Alimta fairly well with no significant adverse effects. She is requesting to take the month of September off from chemotherapy as she feels she needs a physical and emotional break.  MEDICAL HISTORY: Past Medical History  Diagnosis Date  . Hypertension   . HYPERLIPIDEMIA 11/05/2007  . ANXIETY 05/13/2009  . DEPRESSIVE DISORDER 05/06/2008  . HYPERTENSION 11/05/2007  . ALLERGIC RHINITIS 11/05/2007  . CONSTIPATION, CHRONIC 05/13/2009  . UTI 05/06/2008  . BACK PAIN 05/11/2008  . OSTEOPOROSIS 11/05/2007  . INSOMNIA-SLEEP DISORDER-UNSPEC 05/13/2009  . FATIGUE 05/13/2009  . Swelling, mass, or lump in chest 11/05/2007  . LUNG CANCER, HX OF 05/06/2008  . lung ca w/ bone mets dx'd 11/2007    lt hip and spine    ALLERGIES:  is allergic to amoxicillin.  MEDICATIONS:  Current Outpatient Prescriptions  Medication Sig Dispense Refill  . aspirin 81 MG EC tablet Take 81 mg by mouth daily.        . Calcium Carbonate-Vitamin D (CALCIUM-VITAMIN D) 500-200 MG-UNIT per tablet Take 1 tablet by mouth 2 (two) times daily.        . Flaxseed, Linseed, (FLAXSEED OIL) 1000 MG CAPS Take 1,000 mg by mouth daily.        . folic acid (FOLVITE) 1 MG tablet Take 1 tablet (1 mg total) by mouth daily.  90 tablet  1  . HYDROcodone-acetaminophen (NORCO) 5-325 MG per tablet Take 1 tablet by mouth every 6 (six) hours as needed for moderate pain.  30 tablet  0  . LORazepam (ATIVAN) 0.5 MG tablet Take 1 tablet (0.5 mg total) by mouth every 8 (eight) hours as needed for anxiety.  40 tablet  1  . losartan-hydrochlorothiazide (HYZAAR) 50-12.5 MG per tablet Take 1 tablet by mouth daily.  90 tablet  3  . Multiple Vitamin (ANTIOXIDANT A/C/E PO) Take by mouth daily.        . Omega-3 350 MG CAPS Take by mouth daily.        . ondansetron (ZOFRAN) 8 MG tablet Take 1 tablet (8 mg total) by mouth every 8 (eight) hours as needed for nausea or vomiting.  20 tablet  1  .  polyethylene glycol powder (MIRALAX) powder Take 17 g by mouth daily.        . Probiotic Product (PROBIOTIC FORMULA) CAPS Take 1 capsule by mouth daily.        . simvastatin (ZOCOR) 40 MG tablet Take 1 tablet (40 mg total) by mouth daily.  90 tablet  3  . vitamin B-12 (CYANOCOBALAMIN) 100 MCG tablet Take 50 mcg by mouth daily.        . vitamin E ([employer] VITAMIN E) 400 UNIT capsule Take 400 Units by mouth daily.        [name] zolendronic acid (ZOMETA) 4 MG/5ML injection Inject 4 mg into the vein once.        [name] zolpidem (AMBIEN CR) 12.5 MG CR tablet Take 1 tablet (12.5 mg total) by mouth at bedtime as needed for sleep.  90 tablet  1  . Alum & Mag Hydroxide-Simeth (MAGIC MOUTHWASH) SOLN Take 5 mLs by mouth 4 (four) times daily. Swish and spit  240 mL  0  . azithromycin (ZITHROMAX) 250 MG tablet 2 tabs a day for 1 day and then 1 a day for 4 days.  6 each  0   No current facility-administered medications for this visit.   Facility-Administered Medications Ordered in Other Visits  Medication Dose Route Frequency Provider Last Rate Last Dose  . PEMEtrexed (ALIMTA) 875 mg in sodium chloride 0.9 % 100 mL chemo infusion  500 mg/m2 (Treatment Plan Actual) Intravenous Once Curt Bears, MD   875 mg at 05/11/14 1619    SURGICAL HISTORY:  Past Surgical History  Procedure Laterality Date  . Tubal ligation    . S/p lul lung surgury  12/2007  . Lobectomy      REVIEW OF SYSTEMS:  Constitutional: negative Eyes: negative Ears, nose, mouth, throat, and face: negative Respiratory: negative Cardiovascular: negative Gastrointestinal: negative Genitourinary:negative Integument/breast: negative Hematologic/lymphatic: negative Musculoskeletal:positive for arthralgias and back pain Neurological: negative Behavioral/Psych: negative Endocrine: negative Allergic/Immunologic: negative   PHYSICAL EXAMINATION: General appearance: alert, cooperative and no distress Head: Normocephalic, without obvious abnormality,  atraumatic Neck: no adenopathy Lymph nodes: Cervical, supraclavicular, and axillary nodes normal. Resp: clear to auscultation bilaterally Back: symmetric, no curvature. ROM normal. No CVA tenderness. Cardio: regular rate and rhythm, S1, S2 normal, no murmur, click, rub or gallop GI: soft, non-tender; bowel sounds normal; no masses,  no organomegaly Extremities: extremities normal, atraumatic, no cyanosis or edema Neurologic: Alert and oriented X 3, normal strength and tone. Normal symmetric reflexes. Normal coordination and gait  ECOG PERFORMANCE STATUS: 0 - Asymptomatic  Blood pressure 114/63, pulse 66, temperature 98.8 F (37.1 C), temperature source Oral, resp. rate 18, height '5\' 5"'  (1.651 m), weight 136 lb 1.6 oz (61.735 kg), SpO2 100.00%.  LABORATORY DATA: Lab Results  Component Value Date   WBC 4.6 05/11/2014  HGB 12.4 05/11/2014   HCT 36.1 05/11/2014   MCV 96.5 05/11/2014   PLT 253 05/11/2014      Chemistry      Component Value Date/Time   NA 138 05/11/2014 1404   NA 138 04/13/2014 1019   NA 140 04/17/2012 0855   K 3.7 05/11/2014 1404   K 3.7 04/13/2014 1019   K 4.1 04/17/2012 0855   CL 100 05/11/2014 1404   CL 100 03/04/2013 0901   CL 99 04/17/2012 0855   CO2 25 05/11/2014 1404   CO2 28 04/13/2014 1019   CO2 29 04/17/2012 0855   BUN 13 05/11/2014 1404   BUN 10.1 04/13/2014 1019   BUN 15 04/17/2012 0855   CREATININE 1.08 05/11/2014 1404   CREATININE 1.1 04/13/2014 1019   CREATININE 1.4* 04/17/2012 0855      Component Value Date/Time   CALCIUM 9.5 05/11/2014 1404   CALCIUM 10.0 04/13/2014 1019   CALCIUM 9.5 04/17/2012 0855   ALKPHOS 68 05/11/2014 1404   ALKPHOS 58 04/13/2014 1019   ALKPHOS 68 04/17/2012 0855   AST 21 05/11/2014 1404   AST 23 04/13/2014 1019   AST 29 04/17/2012 0855   ALT 15 05/11/2014 1404   ALT 21 04/13/2014 1019   ALT 31 04/17/2012 0855   BILITOT 0.3 05/11/2014 1404   BILITOT 0.44 04/13/2014 1019   BILITOT 0.60 04/17/2012 0855       RADIOGRAPHIC  STUDIES:  ASSESSMENT AND PLAN: This is a very pleasant 73 years old white female with recurrent non-small cell lung cancer, adenocarcinoma. The patient is currently on maintenance Alimta monthly status post total of 35cycles.  The patient is feeling fine and tolerating the treatment well. The patient will proceed with cycle #36 today as scheduled. Her most recent CT scan revealed stable disease with no evidence of new or progressive disease within the chest, abdomen or pelvis. We will honor her request for a break from chemotherapy for the month of September. She will resume chemotherapy with single agent Alimata on 07/06/2014.  The patient was advised to call immediately if she has any concerning symptoms in the interval.  The patient voices understanding of current disease status and treatment options and is in agreement with the current care plan.  All questions were answered. The patient knows to call the clinic with any problems, questions or concerns. We can certainly see the patient much sooner if necessary.  Awilda Metro E   Disclaimer: This note was dictated with voice recognition software. Similar sounding words can inadvertently be transcribed and may not be corrected upon review.

## 2014-05-17 NOTE — Patient Instructions (Signed)
You will have a "chemo break" for September Follow up 07/06/2014 with you next scheduled cycle of chemotherapy

## 2014-05-19 ENCOUNTER — Encounter: Payer: Self-pay | Admitting: Nurse Practitioner

## 2014-05-19 ENCOUNTER — Encounter: Payer: Self-pay | Admitting: Internal Medicine

## 2014-05-19 ENCOUNTER — Ambulatory Visit (INDEPENDENT_AMBULATORY_CARE_PROVIDER_SITE_OTHER): Payer: Medicare Other | Admitting: Nurse Practitioner

## 2014-05-19 VITALS — BP 104/70 | HR 70 | Temp 98.3°F | Ht 65.0 in | Wt 132.8 lb

## 2014-05-19 DIAGNOSIS — R5381 Other malaise: Secondary | ICD-10-CM

## 2014-05-19 DIAGNOSIS — R5383 Other fatigue: Secondary | ICD-10-CM

## 2014-05-19 DIAGNOSIS — J329 Chronic sinusitis, unspecified: Secondary | ICD-10-CM

## 2014-05-19 DIAGNOSIS — R51 Headache: Secondary | ICD-10-CM

## 2014-05-19 MED ORDER — AZITHROMYCIN 250 MG PO TABS: ORAL_TABLET | ORAL | Status: DC

## 2014-05-19 NOTE — Telephone Encounter (Signed)
Can julie morgan see pt today?

## 2014-05-19 NOTE — Progress Notes (Signed)
Pre visit review using our clinic review tool, if applicable. No additional management support is needed unless otherwise documented below in the visit note. 

## 2014-05-19 NOTE — Patient Instructions (Signed)
Call clinic if symptoms not resolved or worsen.  Call clinic with questions or concerns.  Adequate fluid intake, Rest.  Keep regular scheduled appointment with Dr. Jenny Reichmann      Sinusitis Sinusitis is redness, soreness, and inflammation of the paranasal sinuses. Paranasal sinuses are air pockets within the bones of your face (beneath the eyes, the middle of the forehead, or above the eyes). In healthy paranasal sinuses, mucus is able to drain out, and air is able to circulate through them by way of your nose. However, when your paranasal sinuses are inflamed, mucus and air can become trapped. This can allow bacteria and other germs to grow and cause infection. Sinusitis can develop quickly and last only a short time (acute) or continue over a long period (chronic). Sinusitis that lasts for more than 12 weeks is considered chronic.  CAUSES  Causes of sinusitis include:  Allergies.  Structural abnormalities, such as displacement of the cartilage that separates your nostrils (deviated septum), which can decrease the air flow through your nose and sinuses and affect sinus drainage.  Functional abnormalities, such as when the small hairs (cilia) that line your sinuses and help remove mucus do not work properly or are not present. SIGNS AND SYMPTOMS  Symptoms of acute and chronic sinusitis are the same. The primary symptoms are pain and pressure around the affected sinuses. Other symptoms include:  Upper toothache.  Earache.  Headache.  Bad breath.  Decreased sense of smell and taste.  A cough, which worsens when you are lying flat.  Fatigue.  Fever.  Thick drainage from your nose, which often is green and may contain pus (purulent).  Swelling and warmth over the affected sinuses. DIAGNOSIS  Your health care provider will perform a physical exam. During the exam, your health care provider may:  Look in your nose for signs of abnormal growths in your nostrils (nasal polyps).  Tap  over the affected sinus to check for signs of infection.  View the inside of your sinuses (endoscopy) using an imaging device that has a light attached (endoscope). If your health care provider suspects that you have chronic sinusitis, one or more of the following tests may be recommended:  Allergy tests.  Nasal culture. A sample of mucus is taken from your nose, sent to a lab, and screened for bacteria.  Nasal cytology. A sample of mucus is taken from your nose and examined by your health care provider to determine if your sinusitis is related to an allergy. TREATMENT  Most cases of acute sinusitis are related to a viral infection and will resolve on their own within 10 days. Sometimes medicines are prescribed to help relieve symptoms (pain medicine, decongestants, nasal steroid sprays, or saline sprays).  However, for sinusitis related to a bacterial infection, your health care provider will prescribe antibiotic medicines. These are medicines that will help kill the bacteria causing the infection.  Rarely, sinusitis is caused by a fungal infection. In theses cases, your health care provider will prescribe antifungal medicine. For some cases of chronic sinusitis, surgery is needed. Generally, these are cases in which sinusitis recurs more than 3 times per year, despite other treatments. HOME CARE INSTRUCTIONS   Drink plenty of water. Water helps thin the mucus so your sinuses can drain more easily.  Use a humidifier.  Inhale steam 3 to 4 times a day (for example, sit in the bathroom with the shower running).  Apply a warm, moist washcloth to your face 3 to 4 times a  day, or as directed by your health care provider.  Use saline nasal sprays to help moisten and clean your sinuses.  Take medicines only as directed by your health care provider.  If you were prescribed either an antibiotic or antifungal medicine, finish it all even if you start to feel better. SEEK IMMEDIATE MEDICAL CARE  IF:  You have increasing pain or severe headaches.  You have nausea, vomiting, or drowsiness.  You have swelling around your face.  You have vision problems.  You have a stiff neck.  You have difficulty breathing. MAKE SURE YOU:   Understand these instructions.  Will watch your condition.  Will get help right away if you are not doing well or get worse. Document Released: 09/18/2005 Document Revised: 02/02/2014 Document Reviewed: 10/03/2011 [facility] Patient Information 2015 [place], [place]. This information is not intended to replace advice given to you by your health care provider. Make sure you discuss any questions you have with your health care provider.

## 2014-05-20 ENCOUNTER — Telehealth: Payer: Self-pay | Admitting: Internal Medicine

## 2014-05-20 NOTE — Telephone Encounter (Signed)
S/w pt, advised sept appt cx'd and next appt is 10/5 @ 10.15. Pt verbalized understanding.

## 2014-05-20 NOTE — Progress Notes (Signed)
Subjective:    Patient ID: Tamara Ortiz, female    DOB: 08/04/1941, 73 y.o.   MRN: [id number]  HPI  Patient is seen in office today for complaints of headache 5/10 pain scale gradually escalating over past 5 days.  Headache worse with bending forward or lying down.  Reports expectorating and coughing out large amounts of phlegm thick yellow/white in color with associated fatigue, poor appetiie and nausea. Denies chest tightness, shortness of breath,no fever, chills, or sore throat. Patient currently receiving chemotherapy regimen    Review of Systems  Constitutional: Positive for appetite change. Negative for fever and chills.  HENT: Positive for congestion, postnasal drip and sinus pressure. Negative for ear discharge, ear pain, facial swelling, mouth sores, sneezing and sore throat.   Respiratory: Positive for cough. Negative for chest tightness and shortness of breath.   Cardiovascular: Negative.  Negative for palpitations.  Gastrointestinal: Negative for nausea and vomiting.  Musculoskeletal: Negative for arthralgias.  Neurological: Positive for headaches.     Past Medical History  Diagnosis Date  . Hypertension   . HYPERLIPIDEMIA 11/05/2007  . ANXIETY 05/13/2009  . DEPRESSIVE DISORDER 05/06/2008  . HYPERTENSION 11/05/2007  . ALLERGIC RHINITIS 11/05/2007  . CONSTIPATION, CHRONIC 05/13/2009  . UTI 05/06/2008  . BACK PAIN 05/11/2008  . OSTEOPOROSIS 11/05/2007  . INSOMNIA-SLEEP DISORDER-UNSPEC 05/13/2009  . FATIGUE 05/13/2009  . Swelling, mass, or lump in chest 11/05/2007  . LUNG CANCER, HX OF 05/06/2008  . lung ca w/ bone mets dx'd 11/2007    lt hip and spine    History   Social History  . Marital Status: Single    Spouse Name: N/A    Number of Children: N/A  . Years of Education: N/A   Occupational History  . 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 Smoking status: Never Smoker   . Smokeless  tobacco: Never Used  . Alcohol Use: Yes  . Drug Use: No  . Sexual Activity: No   Other Topics Concern  . Not on file   Social History Narrative  . No narrative on file    Past Surgical History  Procedure Laterality Date  . Tubal ligation    . S/p lul lung surgury  12/2007  . Lobectomy      Family History  Problem Relation Age of Onset  . Heart disease Mother   . ALS Father   . Alcohol abuse Son     ETOH    Allergies  Allergen Reactions  . Amoxicillin     REACTION: hives/itch    Current Outpatient Prescriptions on File Prior to Visit  Medication Sig Dispense Refill  . Alum & Mag Hydroxide-Simeth (MAGIC MOUTHWASH) SOLN Take 5 mLs by mouth 4 (four) times daily. Swish and spit  240 mL  0  . aspirin 81 MG EC tablet Take 81 mg by mouth daily.        . Calcium Carbonate-Vitamin D (CALCIUM-VITAMIN D) 500-200 MG-UNIT per tablet Take 1 tablet by mouth 2 (two) times daily.        . Flaxseed, Linseed, (FLAXSEED OIL) 1000 MG CAPS Take 1,000 mg by mouth daily.        . folic acid (FOLVITE) 1 MG tablet Take 1 tablet (1 mg total) by mouth daily.  90 tablet  1  . HYDROcodone-acetaminophen (NORCO) 5-325 MG per tablet Take 1 tablet by mouth every 6 (six) hours as  needed for moderate pain.  30 tablet  0  . LORazepam (ATIVAN) 0.5 MG tablet Take 1 tablet (0.5 mg total) by mouth every 8 (eight) hours as needed for anxiety.  40 tablet  1  . losartan-hydrochlorothiazide (HYZAAR) 50-12.5 MG per tablet Take 1 tablet by mouth daily.  90 tablet  3  . Multiple Vitamin (ANTIOXIDANT A/C/E PO) Take by mouth daily.        . Omega-3 350 MG CAPS Take by mouth daily.        . ondansetron (ZOFRAN) 8 MG tablet Take 1 tablet (8 mg total) by mouth every 8 (eight) hours as needed for nausea or vomiting.  20 tablet  1  . polyethylene glycol powder (MIRALAX) powder Take 17 g by mouth daily.        . Probiotic Product (PROBIOTIC FORMULA) CAPS Take 1 capsule by mouth daily.        . simvastatin (ZOCOR) 40 MG tablet  Take 1 tablet (40 mg total) by mouth daily.  90 tablet  3  . vitamin B-12 (CYANOCOBALAMIN) 100 MCG tablet Take 50 mcg by mouth daily.        [name] zolendronic acid (ZOMETA) 4 MG/5ML injection Inject 4 mg into the vein once.        [name] zolpidem (AMBIEN CR) 12.5 MG CR tablet Take 1 tablet (12.5 mg total) by mouth at bedtime as needed for sleep.  90 tablet  1   No current facility-administered medications on file prior to visit.    BP 104/70  Pulse 70  Temp(Src) 98.3 F (36.8 C) (Oral)  Ht 5\' 5"  (1.651 m)  Wt 132 lb 12.8 oz (60.238 kg)  BMI 22.10 kg/m2  SpO2 97%         Objective:   Physical Exam  Vitals reviewed. Constitutional: She appears well-developed. No distress.  HENT:  Head: Normocephalic.  Right Ear: External ear normal.  Left Ear: External ear normal.  Oropharygnx with large amount of thick whitish secretions  with mild erythema posteriorly. No lymphadenopathy   Eyes: Pupils are equal, round, and reactive to light. Right eye exhibits no discharge. Left eye exhibits no discharge.  Cardiovascular: Normal rate and regular rhythm.   Pulmonary/Chest: Effort normal and breath sounds normal. No respiratory distress. She has no wheezes. She has no rales.  Skin: Skin is warm and dry.  Psychiatric: She has a normal mood and affect.          Assessment & Plan:  1. Unspecified sinusitis (chronic) Complete antibiotic.   - azithromycin (ZITHROMAX) 250 MG tablet; 2 tablets po on day 1 , then 1 tablet daily until complete  Dispense: 6 tablet; Refill: 0  2. Headache(784.0) Advil/or acetaminophen as  tolerated  3. Other malaise and fatigue Rest, Adequate fluid and calorie intake.  Patient to call clinic if symptoms worsen, not resolved,or with questions or concerns.  Patient to keep appointment with Primary Care Physicican

## 2014-06-09 ENCOUNTER — Ambulatory Visit: Payer: Medicare Other

## 2014-06-09 ENCOUNTER — Other Ambulatory Visit: Payer: Medicare Other

## 2014-06-09 ENCOUNTER — Encounter: Payer: Medicare Other | Admitting: Nutrition

## 2014-06-15 ENCOUNTER — Encounter: Payer: Self-pay | Admitting: Internal Medicine

## 2014-06-16 ENCOUNTER — Telehealth: Payer: Self-pay | Admitting: *Deleted

## 2014-06-16 DIAGNOSIS — C3490 Malignant neoplasm of unspecified part of unspecified bronchus or lung: Secondary | ICD-10-CM

## 2014-06-16 NOTE — Progress Notes (Signed)
Per dr Vista Mink, pt needs to have MRI of the brain, CT CAP to reevaluate disease.  We will schedule a f/u 2-3 days after CT to review results.  Pt is aware that schedulers will be in touch with new appts.  Will leave 10/5 appts in place for now.  Pt verbalized understanding.

## 2014-06-16 NOTE — Telephone Encounter (Signed)
Order for CT scan and MRI placed in EPIC.  Onc tx schedule filled out.  SLJ

## 2014-06-26 ENCOUNTER — Other Ambulatory Visit: Payer: Self-pay | Admitting: *Deleted

## 2014-06-26 DIAGNOSIS — C3490 Malignant neoplasm of unspecified part of unspecified bronchus or lung: Secondary | ICD-10-CM

## 2014-06-26 MED ORDER — FOLIC ACID 1 MG PO TABS: 1.0000 mg | ORAL_TABLET | Freq: Every day | ORAL | Status: DC

## 2014-06-29 ENCOUNTER — Other Ambulatory Visit (HOSPITAL_COMMUNITY): Payer: Medicare Other

## 2014-06-29 ENCOUNTER — Ambulatory Visit (HOSPITAL_COMMUNITY)
Admission: RE | Admit: 2014-06-29 | Discharge: 2014-06-29 | Disposition: A | Discharge status: Home / Self Care | Payer: Medicare Other | Source: Ambulatory Visit | Attending: Internal Medicine | Admitting: Internal Medicine

## 2014-06-29 ENCOUNTER — Other Ambulatory Visit (HOSPITAL_BASED_OUTPATIENT_CLINIC_OR_DEPARTMENT_OTHER): Payer: Medicare Other

## 2014-06-29 ENCOUNTER — Encounter (HOSPITAL_COMMUNITY): Payer: Self-pay

## 2014-06-29 DIAGNOSIS — M899 Disorder of bone, unspecified: Secondary | ICD-10-CM | POA: Insufficient documentation

## 2014-06-29 DIAGNOSIS — C3410 Malignant neoplasm of upper lobe, unspecified bronchus or lung: Secondary | ICD-10-CM

## 2014-06-29 DIAGNOSIS — K449 Diaphragmatic hernia without obstruction or gangrene: Secondary | ICD-10-CM | POA: Insufficient documentation

## 2014-06-29 DIAGNOSIS — M949 Disorder of cartilage, unspecified: Secondary | ICD-10-CM | POA: Diagnosis not present

## 2014-06-29 DIAGNOSIS — I700 Atherosclerosis of aorta: Secondary | ICD-10-CM | POA: Insufficient documentation

## 2014-06-29 DIAGNOSIS — C3490 Malignant neoplasm of unspecified part of unspecified bronchus or lung: Secondary | ICD-10-CM

## 2014-06-29 DIAGNOSIS — I2510 Atherosclerotic heart disease of native coronary artery without angina pectoris: Secondary | ICD-10-CM | POA: Diagnosis not present

## 2014-06-29 LAB — COMPREHENSIVE METABOLIC PANEL (CC13)
ALT: 13 U/L (ref 0–55)
AST: 20 U/L (ref 5–34)
Albumin: 3.6 g/dL (ref 3.5–5.0)
Alkaline Phosphatase: 62 U/L (ref 40–150)
Anion Gap: 8 mEq/L (ref 3–11)
BUN: 12.4 mg/dL (ref 7.0–26.0)
CALCIUM: 10 mg/dL (ref 8.4–10.4)
CHLORIDE: 103 meq/L (ref 98–109)
CO2: 29 meq/L (ref 22–29)
Creatinine: 1 mg/dL (ref 0.6–1.1)
Glucose: 95 mg/dl (ref 70–140)
Potassium: 3.8 mEq/L (ref 3.5–5.1)
SODIUM: 140 meq/L (ref 136–145)
Total Bilirubin: 0.44 mg/dL (ref 0.20–1.20)
Total Protein: 6.7 g/dL (ref 6.4–8.3)

## 2014-06-29 LAB — CBC WITH DIFFERENTIAL/PLATELET
BASO%: 1 % (ref 0.0–2.0)
Basophils Absolute: 0.1 10*3/uL (ref 0.0–0.1)
EOS%: 1 % (ref 0.0–7.0)
Eosinophils Absolute: 0.1 10*3/uL (ref 0.0–0.5)
HCT: 37 % (ref 34.8–46.6)
HGB: 12.1 g/dL (ref 11.6–15.9)
LYMPH%: 31.4 % (ref 14.0–49.7)
MCH: 32.1 pg (ref 25.1–34.0)
MCHC: 32.7 g/dL (ref 31.5–36.0)
MCV: 98 fL (ref 79.5–101.0)
MONO#: 0.5 10*3/uL (ref 0.1–0.9)
MONO%: 9.2 % (ref 0.0–14.0)
NEUT#: 3.2 10*3/uL (ref 1.5–6.5)
NEUT%: 57.4 % (ref 38.4–76.8)
PLATELETS: 217 10*3/uL (ref 145–400)
RBC: 3.77 10*6/uL (ref 3.70–5.45)
RDW: 12.8 % (ref 11.2–14.5)
WBC: 5.6 10*3/uL (ref 3.9–10.3)
lymph#: 1.8 10*3/uL (ref 0.9–3.3)

## 2014-06-29 MED ORDER — IOHEXOL 300 MG/ML  SOLN
100.0000 mL | Freq: Once | INTRAMUSCULAR | Status: AC | PRN
  Administered 2014-06-29: 100 mL via INTRAVENOUS

## 2014-06-30 ENCOUNTER — Telehealth: Payer: Self-pay | Admitting: Medical Oncology

## 2014-06-30 ENCOUNTER — Ambulatory Visit (HOSPITAL_COMMUNITY): Admission: RE | Admit: 2014-06-30 | Payer: Medicare Other | Source: Ambulatory Visit

## 2014-06-30 ENCOUNTER — Encounter: Payer: Self-pay | Admitting: Internal Medicine

## 2014-06-30 NOTE — Telephone Encounter (Signed)
I returned Tamara Ortiz's call . She had CT scan yesterday and after procedure she ate BBQ sandwich and 3 hours later she vomited x 5 , had diarrhea. She also reports an episode of chills.She took Zofran and is taking fluids. I offered appt today with NP and she declined at this time. i told her to call back for worsening symptoms or fever.

## 2014-06-30 NOTE — Progress Notes (Signed)
Read message at 2:20 pm.  Called patient to discuss symptoms.  "I have not thrown up today but last night was rough.  I fel my stomach is settling down but still not great."  Asked that she try crackers, toast, chicken rice soup but not prepared too hot.  She is drinking Gatorade and trying to rehydrate.  Informed her the n/v and diarrhea are symptoms from the scan dye.  Appointment has been rescheduled for tomorrow am at 1100 am.

## 2014-07-01 ENCOUNTER — Ambulatory Visit (HOSPITAL_COMMUNITY)
Admission: RE | Admit: 2014-07-01 | Discharge: 2014-07-01 | Disposition: A | Discharge status: Home / Self Care | Payer: Medicare Other | Source: Ambulatory Visit | Attending: Internal Medicine | Admitting: Internal Medicine

## 2014-07-01 DIAGNOSIS — J3489 Other specified disorders of nose and nasal sinuses: Secondary | ICD-10-CM | POA: Diagnosis not present

## 2014-07-01 DIAGNOSIS — C7951 Secondary malignant neoplasm of bone: Secondary | ICD-10-CM | POA: Insufficient documentation

## 2014-07-01 DIAGNOSIS — G9389 Other specified disorders of brain: Secondary | ICD-10-CM | POA: Diagnosis not present

## 2014-07-01 DIAGNOSIS — C3490 Malignant neoplasm of unspecified part of unspecified bronchus or lung: Secondary | ICD-10-CM | POA: Insufficient documentation

## 2014-07-01 DIAGNOSIS — C7952 Secondary malignant neoplasm of bone marrow: Secondary | ICD-10-CM

## 2014-07-01 MED ORDER — GADOBENATE DIMEGLUMINE 529 MG/ML IV SOLN
15.0000 mL | Freq: Once | INTRAVENOUS | Status: AC | PRN
Start: 2014-07-01 — End: 2014-07-01
  Administered 2014-07-01: 13 mL via INTRAVENOUS

## 2014-07-02 ENCOUNTER — Other Ambulatory Visit: Payer: Self-pay | Admitting: Internal Medicine

## 2014-07-02 NOTE — Telephone Encounter (Signed)
Done hardcopy to robin  

## 2014-07-03 ENCOUNTER — Encounter: Payer: Self-pay | Admitting: Pharmacist

## 2014-07-03 DIAGNOSIS — C801 Malignant (primary) neoplasm, unspecified: Secondary | ICD-10-CM | POA: Insufficient documentation

## 2014-07-03 NOTE — Telephone Encounter (Signed)
Faxed hardcopy for Zolpidem 12.5 to [facility] [place] [place]

## 2014-07-06 ENCOUNTER — Other Ambulatory Visit: Payer: Medicare Other

## 2014-07-06 ENCOUNTER — Other Ambulatory Visit: Payer: Self-pay | Admitting: Radiation Therapy

## 2014-07-06 ENCOUNTER — Telehealth: Payer: Self-pay | Admitting: Internal Medicine

## 2014-07-06 ENCOUNTER — Ambulatory Visit (HOSPITAL_BASED_OUTPATIENT_CLINIC_OR_DEPARTMENT_OTHER): Payer: Medicare Other | Admitting: Internal Medicine

## 2014-07-06 ENCOUNTER — Ambulatory Visit: Payer: Medicare Other | Admitting: Nutrition

## 2014-07-06 ENCOUNTER — Ambulatory Visit: Payer: Medicare Other

## 2014-07-06 ENCOUNTER — Ambulatory Visit (HOSPITAL_BASED_OUTPATIENT_CLINIC_OR_DEPARTMENT_OTHER): Payer: Medicare Other

## 2014-07-06 ENCOUNTER — Encounter: Payer: Self-pay | Admitting: Internal Medicine

## 2014-07-06 ENCOUNTER — Other Ambulatory Visit: Payer: Self-pay | Admitting: Medical Oncology

## 2014-07-06 VITALS — BP 106/63 | HR 62 | Temp 97.9°F | Resp 19 | Ht 65.0 in | Wt 131.1 lb

## 2014-07-06 DIAGNOSIS — C7931 Secondary malignant neoplasm of brain: Secondary | ICD-10-CM

## 2014-07-06 DIAGNOSIS — C3492 Malignant neoplasm of unspecified part of left bronchus or lung: Secondary | ICD-10-CM

## 2014-07-06 DIAGNOSIS — C3412 Malignant neoplasm of upper lobe, left bronchus or lung: Secondary | ICD-10-CM

## 2014-07-06 DIAGNOSIS — Z23 Encounter for immunization: Secondary | ICD-10-CM

## 2014-07-06 DIAGNOSIS — C7949 Secondary malignant neoplasm of other parts of nervous system: Principal | ICD-10-CM

## 2014-07-06 DIAGNOSIS — M545 Low back pain: Secondary | ICD-10-CM

## 2014-07-06 DIAGNOSIS — R630 Anorexia: Secondary | ICD-10-CM

## 2014-07-06 DIAGNOSIS — Z5111 Encounter for antineoplastic chemotherapy: Secondary | ICD-10-CM

## 2014-07-06 DIAGNOSIS — C7951 Secondary malignant neoplasm of bone: Secondary | ICD-10-CM

## 2014-07-06 DIAGNOSIS — C801 Malignant (primary) neoplasm, unspecified: Secondary | ICD-10-CM

## 2014-07-06 MED ORDER — ZOLEDRONIC ACID 4 MG/100ML IV SOLN
4.0000 mg | Freq: Once | INTRAVENOUS | Status: AC
  Administered 2014-07-06: 4 mg via INTRAVENOUS
  Filled 2014-07-06: qty 100

## 2014-07-06 MED ORDER — METHYLPREDNISOLONE (PAK) 4 MG PO TABS: ORAL_TABLET | ORAL | Status: DC

## 2014-07-06 MED ORDER — INFLUENZA VAC SPLIT QUAD 0.5 ML IM SUSY
0.5000 mL | PREFILLED_SYRINGE | Freq: Once | INTRAMUSCULAR | Status: DC
  Administered 2014-07-06: 0.5 mL via INTRAMUSCULAR
  Filled 2014-07-06: qty 0.5

## 2014-07-06 MED ORDER — SODIUM CHLORIDE 0.9 % IV SOLN
Freq: Once | INTRAVENOUS | Status: AC
  Administered 2014-07-06: 12:00:00 via INTRAVENOUS

## 2014-07-06 MED ORDER — CYANOCOBALAMIN 1000 MCG/ML IJ SOLN
1000.0000 ug | Freq: Once | INTRAMUSCULAR | Status: AC
  Administered 2014-07-06: 1000 ug via INTRAMUSCULAR

## 2014-07-06 MED ORDER — SODIUM CHLORIDE 0.9 % IV SOLN
500.0000 mg/m2 | Freq: Once | INTRAVENOUS | Status: AC
  Administered 2014-07-06: 875 mg via INTRAVENOUS
  Filled 2014-07-06: qty 35

## 2014-07-06 MED ORDER — ONDANSETRON 8 MG/NS 50 ML IVPB
INTRAVENOUS | Status: AC
  Filled 2014-07-06: qty 8

## 2014-07-06 MED ORDER — DEXAMETHASONE SODIUM PHOSPHATE 10 MG/ML IJ SOLN
10.0000 mg | Freq: Once | INTRAMUSCULAR | Status: AC
  Administered 2014-07-06: 10 mg via INTRAVENOUS

## 2014-07-06 MED ORDER — ONDANSETRON 8 MG/50ML IVPB (CHCC)
8.0000 mg | Freq: Once | INTRAVENOUS | Status: AC
  Administered 2014-07-06: 8 mg via INTRAVENOUS

## 2014-07-06 MED ORDER — DEXAMETHASONE SODIUM PHOSPHATE 10 MG/ML IJ SOLN
INTRAMUSCULAR | Status: AC
  Filled 2014-07-06: qty 1

## 2014-07-06 MED ORDER — CYANOCOBALAMIN 1000 MCG/ML IJ SOLN
INTRAMUSCULAR | Status: AC
  Filled 2014-07-06: qty 1

## 2014-07-06 NOTE — Progress Notes (Signed)
Nutrition followup completed with patient receiving chemotherapy for lung cancer and metastases.    Patient states she's not feeling well.  She has a poor appetite.  She reports nausea and vomiting resolved.  She has had issues with constipation. Weight down to 131.1 pounds October 5, down from 135.8 pounds July 13.  Patient reports she tries to drink Carnation breakfast essentials.  She generally eats a healthy diet but has not had much of an appetite to do so.  Nutrition diagnosis: Inadequate oral intake continues.  Intervention: Educated patient to increase Carnation breakfast essentials 3 times a day between meals. Educated patient on strategies for poor appetite and increasing calories and protein.  Provided fact sheets. Educated patient on ways to add calories to foods she currently consumes. Provided coupons. Questions were answered.  Teach back method used.  Monitoring, evaluation, goals: Patient will increase Carnation breakfast essentials 3 times a day.  She will work to increase oral intake at meals.  Goal is weight maintenance.  Next visit: Monday, November 2, during chemotherapy.

## 2014-07-06 NOTE — Telephone Encounter (Signed)
gv pt appt schedule for nov. lmonvm for Tamara Ortiz Navigator requested appt w/Dr. Tammi Klippel. Pt aware radonc will be in touch w/her re appt.

## 2014-07-06 NOTE — Telephone Encounter (Signed)
I told her rx for appetite sent.

## 2014-07-06 NOTE — Patient Instructions (Signed)
[facility] Discharge Instructions for Patients Receiving Chemotherapy  Today you received the following chemotherapy agents Alimta, Zometa  To help prevent nausea and vomiting after your treatment, we encourage you to take your nausea medication    If you develop nausea and vomiting that is not controlled by your nausea medication, call the clinic.   BELOW ARE SYMPTOMS THAT SHOULD BE REPORTED IMMEDIATELY:  *FEVER GREATER THAN 100.5 F  *CHILLS WITH OR WITHOUT FEVER  NAUSEA AND VOMITING THAT IS NOT CONTROLLED WITH YOUR NAUSEA MEDICATION  *UNUSUAL SHORTNESS OF BREATH  *UNUSUAL BRUISING OR BLEEDING  TENDERNESS IN MOUTH AND THROAT WITH OR WITHOUT PRESENCE OF ULCERS  *URINARY PROBLEMS  *BOWEL PROBLEMS  UNUSUAL RASH Items with * indicate a potential emergency and should be followed up as soon as possible.  Feel free to call the clinic you have any questions or concerns. The clinic phone number is [phone].

## 2014-07-06 NOTE — Progress Notes (Signed)
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   PRIOR THERAPY:  1. Status post left upper lobectomy with mediastinal lymph node dissection under the care of Dr. Roxan Hockey on December 10, 2007.  2. Status post 4 cycles of adjuvant chemotherapy with cisplatin and Taxotere. Last dose was given 03/22/2008.  3. Status post 6 cycles of systemic chemotherapy with carboplatin and Alimta for metastatic disease in the bone. The last dose was given July 02, 2010, with stable disease.  4. Status post 12 cycles of maintenance Alimta at 500 mg/sq m given every 3 weeks. The last dose was given 05/08/2011.  CURRENT THERAPY:  1. Maintenance Alimta at 500 mg/sq m given every 4 weeks. The patient is status post 37 cycles.  2. Zometa 4 mg IV given every 2 months for bone metastasis.   CHEMOTHERAPY INTENT: Palliative  CURRENT # OF CHEMOTHERAPY CYCLES: 38 CURRENT ANTIEMETICS: none  CURRENT SMOKING STATUS: Never smoker   ORAL CHEMOTHERAPY AND CONSENT: n/a  CURRENT BISPHOSPHONATES USE: Yes, Zometa given every 2 months  PAIN MANAGEMENT: [employer]  NARCOTICS INDUCED CONSTIPATION: Occasional, uses MiraLax  LIVING WILL AND CODE STATUS: Has a living will   INTERVAL HISTORY: Tamara Ortiz 73 y.o. female returns to the clinic today for followup visit. The patient is feeling fine today with no specific complaints except for a few days of fatigue and low back pain. She is also complaining of lack of appetite and she lost around 8 pounds since her last visit.  She has no chest pain, shortness of breath, cough or hemoptysis. She denied having any significant nausea or vomiting. She has no fever  or chills. She has no significant night sweats. She is tolerating her treatment with Alimta fairly well with no significant adverse effects. She had repeat CT scan of the chest, abdomen and pelvis as well as MRI of the brain performed recently and she is here for evaluation and discussion of her scan results.   MEDICAL HISTORY: Past Medical History  Diagnosis Date  . Hypertension   . HYPERLIPIDEMIA 11/05/2007  . ANXIETY 05/13/2009  . DEPRESSIVE DISORDER 05/06/2008  . HYPERTENSION 11/05/2007  . ALLERGIC RHINITIS 11/05/2007  . CONSTIPATION, CHRONIC 05/13/2009  . UTI 05/06/2008  . BACK PAIN 05/11/2008  . OSTEOPOROSIS 11/05/2007  . INSOMNIA-SLEEP DISORDER-UNSPEC 05/13/2009  . FATIGUE 05/13/2009  . Swelling, mass, or lump in chest 11/05/2007  . LUNG CANCER, HX OF 05/06/2008  . lung ca w/ bone mets dx'd 11/2007    lt hip and spine    ALLERGIES:  is allergic to amoxicillin.  MEDICATIONS:  Current Outpatient Prescriptions  Medication Sig Dispense Refill  . Alum & Mag Hydroxide-Simeth (MAGIC MOUTHWASH) SOLN Take 5 mLs by mouth 4 (four) times daily. Swish and spit  240 mL  0  . aspirin 81 MG EC tablet Take 81 mg by mouth daily.        . Calcium Carbonate-Vitamin D (CALCIUM-VITAMIN D) 500-200 MG-UNIT per tablet Take 1 tablet by mouth 2 (two) times daily.        . Flaxseed, Linseed, (FLAXSEED OIL) 1000 MG CAPS Take 1,000 mg by mouth daily.        [name]  folic acid (FOLVITE) 1 MG tablet Take 1 tablet (1 mg total) by mouth daily.  90 tablet  0  . HYDROcodone-acetaminophen (NORCO) 5-325 MG per tablet Take 1 tablet by mouth every 6 (six) hours as needed for moderate pain.  30 tablet  0  . LORazepam (ATIVAN) 0.5 MG tablet Take 1 tablet (0.5 mg total) by mouth every 8 (eight) hours as needed for anxiety.  40 tablet  1  . losartan-hydrochlorothiazide (HYZAAR) 50-12.5 MG per tablet Take 1 tablet by mouth daily.  90 tablet  3  . Multiple Vitamin (ANTIOXIDANT A/C/E PO) Take by mouth daily.        . Omega-3 350 MG CAPS Take by  mouth daily.        . polyethylene glycol powder (MIRALAX) powder Take 17 g by mouth daily.        . Probiotic Product (PROBIOTIC FORMULA) CAPS Take 1 capsule by mouth daily.        . simvastatin (ZOCOR) 40 MG tablet Take 1 tablet (40 mg total) by mouth daily.  90 tablet  3  . vitamin B-12 (CYANOCOBALAMIN) 100 MCG tablet Take 50 mcg by mouth daily.        [name] zolendronic acid (ZOMETA) 4 MG/5ML injection Inject 4 mg into the vein once.        [name] zolpidem (AMBIEN CR) 12.5 MG CR tablet TAKE 1 TABLET BY MOUTH EVERY NIGHT AT BEDTIME AS NEEDED FOR SLEEP  90 tablet  1  . ondansetron (ZOFRAN) 8 MG tablet Take 1 tablet (8 mg total) by mouth every 8 (eight) hours as needed for nausea or vomiting.  20 tablet  1   Current Facility-Administered Medications  Medication Dose Route Frequency Provider Last Rate Last Dose  . Influenza vac split quadrivalent PF (FLUARIX) injection 0.5 mL  0.5 mL Intramuscular Once Curt Bears, MD        SURGICAL HISTORY:  Past Surgical History  Procedure Laterality Date  . Tubal ligation    . S/p lul lung surgury  12/2007  . Lobectomy      REVIEW OF SYSTEMS:  Constitutional: negative Eyes: negative Ears, nose, mouth, throat, and face: negative Respiratory: negative Cardiovascular: negative Gastrointestinal: negative Genitourinary:negative Integument/breast: negative Hematologic/lymphatic: negative Musculoskeletal:positive for arthralgias and back pain Neurological: negative Behavioral/Psych: negative Endocrine: negative Allergic/Immunologic: negative   PHYSICAL EXAMINATION: General appearance: alert, cooperative and no distress Head: Normocephalic, without obvious abnormality, atraumatic Neck: no adenopathy Lymph nodes: Cervical, supraclavicular, and axillary nodes normal. Resp: clear to auscultation bilaterally Back: symmetric, no curvature. ROM normal. No CVA tenderness. Cardio: regular rate and rhythm, S1, S2 normal, no murmur, click, rub or gallop GI:  soft, non-tender; bowel sounds normal; no masses,  no organomegaly Extremities: extremities normal, atraumatic, no cyanosis or edema Neurologic: Alert and oriented X 3, normal strength and tone. Normal symmetric reflexes. Normal coordination and gait  ECOG PERFORMANCE STATUS: 1 - Symptomatic but completely ambulatory  Blood pressure 106/63, pulse 62, temperature 97.9 F (36.6 C), temperature source Oral, resp. rate 19, height '5\' 5"'  (1.651 m), weight 131 lb 1.6 oz (59.467 kg).  LABORATORY DATA: Lab Results  Component Value Date   WBC 5.6 06/29/2014   HGB 12.1 06/29/2014   HCT 37.0 06/29/2014   MCV 98.0 06/29/2014   PLT 217 06/29/2014      Chemistry      Component Value Date/Time   NA 140 06/29/2014 1450   NA 138 05/11/2014 1404   NA 140 04/17/2012 0855   K 3.8  06/29/2014 1450   K 3.7 05/11/2014 1404   K 4.1 04/17/2012 0855   CL 100 05/11/2014 1404   CL 100 03/04/2013 0901   CL 99 04/17/2012 0855   CO2 29 06/29/2014 1450   CO2 25 05/11/2014 1404   CO2 29 04/17/2012 0855   BUN 12.4 06/29/2014 1450   BUN 13 05/11/2014 1404   BUN 15 04/17/2012 0855   CREATININE 1.0 06/29/2014 1450   CREATININE 1.08 05/11/2014 1404   CREATININE 1.4* 04/17/2012 0855      Component Value Date/Time   CALCIUM 10.0 06/29/2014 1450   CALCIUM 9.5 05/11/2014 1404   CALCIUM 9.5 04/17/2012 0855   ALKPHOS 62 06/29/2014 1450   ALKPHOS 68 05/11/2014 1404   ALKPHOS 68 04/17/2012 0855   AST 20 06/29/2014 1450   AST 21 05/11/2014 1404   AST 29 04/17/2012 0855   ALT 13 06/29/2014 1450   ALT 15 05/11/2014 1404   ALT 31 04/17/2012 0855   BILITOT 0.44 06/29/2014 1450   BILITOT 0.3 05/11/2014 1404   BILITOT 0.60 04/17/2012 0855       RADIOGRAPHIC STUDIES: Ct Chest W Contrast  06/29/2014   CLINICAL DATA:  Restaging lung cancer.  EXAM: CT CHEST, ABDOMEN, AND PELVIS WITH CONTRAST  TECHNIQUE: Multidetector CT imaging of the chest, abdomen and pelvis was performed following the standard protocol during bolus administration of intravenous  contrast.  CONTRAST:  159m OMNIPAQUE IOHEXOL 300 MG/ML  SOLN  COMPARISON:  Numerous prior CT scans.  The most recent is 04/14/2014  FINDINGS: CT CHEST FINDINGS  The chest wall is unremarkable and stable. No supraclavicular or axillary mass or adenopathy. No obvious breast masses.  Stable sclerotic bone lesions consistent with treated metastatic disease. No lytic or destructive bone lesions and no spinal canal compromise.  The heart is normal in size. No pericardial effusion. No mediastinal or hilar mass or lymphadenopathy. Small scattered lymph nodes are stable. Stable atherosclerotic changes involving the aorta but no focal aneurysm or dissection. Stable coronary artery calcifications. The esophagus is grossly normal. Stable small hiatal hernia.  The lungs are clear of acute process. Stable surgical changes involving the left hemi thorax but no findings to suggest pulmonary metastatic disease. No pleural effusion.  CT ABDOMEN AND PELVIS FINDINGS  The solid abdominal organs are unremarkable and stable. No findings for metastatic disease involving the liver or adrenal glands.  The stomach, duodenum, small bowel and colon are unremarkable. No inflammatory changes, mass lesions or obstructive findings. No mesenteric or retroperitoneal mass or adenopathy. Small scattered lymph nodes are stable. The aorta and branch vessels are patent.  The uterus and ovaries are unremarkable. The bladder appears normal. No pelvic mass, adenopathy or free pelvic fluid collections. No inguinal mass or adenopathy.  Stable sclerotic bone lesions involving the pelvis. No lytic or destructive bone lesions.  IMPRESSION: Stable CT appearance of the chest, abdomen and pelvis. No findings to suggest recurrent lung cancer or new metastatic disease.  Stable sclerotic bone lesions, likely treated metastasis.   Electronically Signed   By: MKalman JewelsM.D.   On: 06/29/2014 16:41   Mr BJeri CosWELContrast  07/01/2014   CLINICAL DATA:  Current  history of malignant neoplasm of bronchus and lung, unspecified site. Subsequent encounter for restaging.  EXAM: MRI HEAD WITHOUT AND WITH CONTRAST  TECHNIQUE: Multiplanar, multiecho pulse sequences of the brain and surrounding structures were obtained without and with intravenous contrast.  CONTRAST:  138mMULTIHANCE GADOBENATE DIMEGLUMINE 529 MG/ML IV SOLN  COMPARISON:  CT head without and with contrast 01/25/2012. MRI brain 07/07/2008  FINDINGS: Scattered periventricular and subcortical T2 hyperintensities are slightly more prominent and numerous than on the prior exam. No acute infarct, hemorrhage, or mass lesion is present. The ventricles are proportionate to the degree of atrophy. No significant extraaxial fluid collection is present.  Flow is present in the major intracranial arteries. The patient is status post bilateral lens replacements. The globes and orbits are otherwise intact. Scattered mucosal thickening is present throughout the ethmoid air cells. There is some fluid in the mastoid air cells bilaterally. The remaining paranasal sinuses are clear.  The postcontrast images demonstrate 3 5-6 mm lesions within the high right frontal lobe. These are evident on images 44, 45, and 48 of series 11. These are concerning for metastatic disease and were not present on the prior exam. No significant left-sided lesions are evident.  IMPRESSION: 1. Three 5-6 mm lesions within the high right frontal lobe as described, concern for metastatic disease is not present on the prior study. 2. Interval increase in diffuse scattered periventricular and subcortical white matter T2 hyperintensities bilaterally. This likely reflects the sequela of chronic microvascular ischemia. 3. Ethmoid sinus disease. 4. Small bilateral mastoid effusions. No obstructing nasopharyngeal lesion is present.   Electronically Signed   By: Lawrence Santiago M.D.   On: 07/01/2014 14:16   Ct Abdomen Pelvis W Contrast  06/29/2014   CLINICAL DATA:   Restaging lung cancer.  EXAM: CT CHEST, ABDOMEN, AND PELVIS WITH CONTRAST  TECHNIQUE: Multidetector CT imaging of the chest, abdomen and pelvis was performed following the standard protocol during bolus administration of intravenous contrast.  CONTRAST:  156m OMNIPAQUE IOHEXOL 300 MG/ML  SOLN  COMPARISON:  Numerous prior CT scans.  The most recent is 04/14/2014  FINDINGS: CT CHEST FINDINGS  The chest wall is unremarkable and stable. No supraclavicular or axillary mass or adenopathy. No obvious breast masses.  Stable sclerotic bone lesions consistent with treated metastatic disease. No lytic or destructive bone lesions and no spinal canal compromise.  The heart is normal in size. No pericardial effusion. No mediastinal or hilar mass or lymphadenopathy. Small scattered lymph nodes are stable. Stable atherosclerotic changes involving the aorta but no focal aneurysm or dissection. Stable coronary artery calcifications. The esophagus is grossly normal. Stable small hiatal hernia.  The lungs are clear of acute process. Stable surgical changes involving the left hemi thorax but no findings to suggest pulmonary metastatic disease. No pleural effusion.  CT ABDOMEN AND PELVIS FINDINGS  The solid abdominal organs are unremarkable and stable. No findings for metastatic disease involving the liver or adrenal glands.  The stomach, duodenum, small bowel and colon are unremarkable. No inflammatory changes, mass lesions or obstructive findings. No mesenteric or retroperitoneal mass or adenopathy. Small scattered lymph nodes are stable. The aorta and branch vessels are patent.  The uterus and ovaries are unremarkable. The bladder appears normal. No pelvic mass, adenopathy or free pelvic fluid collections. No inguinal mass or adenopathy.  Stable sclerotic bone lesions involving the pelvis. No lytic or destructive bone lesions.  IMPRESSION: Stable CT appearance of the chest, abdomen and pelvis. No findings to suggest recurrent lung  cancer or new metastatic disease.  Stable sclerotic bone lesions, likely treated metastasis.   Electronically Signed   By: MKalman JewelsM.D.   On: 06/29/2014 16:41   ASSESSMENT AND PLAN: This is a very pleasant 73years old white female with recurrent non-small cell lung cancer, adenocarcinoma. The patient  is currently on maintenance Alimta monthly status post total of 37 cycles.  The patient is feeling fine and tolerating the treatment well. The recent CT scan of the chest, abdomen and pelvis showed no evidence for disease progression but MRI of the brain showed 3 questionable subcentimeter lesions concerning for metastatic disease. I discussed the scan results with the patient today. I gave her the option of continuing treatment with single agent Alimta versus take a break off chemotherapy because of her concern about the fatigue and weakness. The patient back to continue with her current treatment with Alimta. She would proceed with cycle #38 today as scheduled. For the metastatic brain lesion, I referred the patient to Dr. Tammi Klippel for consideration of stereotactic radiotherapy to these lesions. For the lack of appetite, I started the patient on Medrol Dosepak. She would come back for followup visit in one month for reevaluation. The patient was advised to call immediately if she has any concerning symptoms in the interval.  The patient voices understanding of current disease status and treatment options and is in agreement with the current care plan.  All questions were answered. The patient knows to call the clinic with any problems, questions or concerns. We can certainly see the patient much sooner if necessary.  Disclaimer: This note was dictated with voice recognition software. Similar sounding words can inadvertently be transcribed and may not be corrected upon review.

## 2014-07-10 ENCOUNTER — Ambulatory Visit
Admission: RE | Admit: 2014-07-10 | Discharge: 2014-07-10 | Disposition: A | Discharge status: Home / Self Care | Payer: Medicare Other | Source: Ambulatory Visit | Attending: Radiation Oncology | Admitting: Radiation Oncology

## 2014-07-10 ENCOUNTER — Encounter: Payer: Self-pay | Admitting: Radiation Oncology

## 2014-07-10 DIAGNOSIS — C7949 Secondary malignant neoplasm of other parts of nervous system: Principal | ICD-10-CM

## 2014-07-10 DIAGNOSIS — C7931 Secondary malignant neoplasm of brain: Secondary | ICD-10-CM

## 2014-07-10 MED ORDER — GADOBENATE DIMEGLUMINE 529 MG/ML IV SOLN
13.0000 mL | Freq: Once | INTRAVENOUS | Status: AC | PRN
  Administered 2014-07-10: 13 mL via INTRAVENOUS

## 2014-07-10 NOTE — Progress Notes (Signed)
73 year old. Female. 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   PRIOR THERAPY:  1. Status post left upper lobectomy with mediastinal lymph node dissection under the care of Dr. Roxan Hockey on December 10, 2007.  2. Status post 4 cycles of adjuvant chemotherapy with cisplatin and Taxotere. Last dose was given 03/22/2008.  3. Status post 6 cycles of systemic chemotherapy with carboplatin and Alimta for metastatic disease in the bone. The last dose was given July 02, 2010, with stable disease.  4. Status post 12 cycles of maintenance Alimta at 500 mg/sq m given every 3 weeks. The last dose was given 05/08/2011 CURRENT THERAPY:  5. Maintenance Alimta at 500 mg/sq m given every 4 weeks. The patient is status post 37 cycles.  6. Zometa 4 mg IV given every 2 months for bone metastasis.   The recent CT scan of the chest, abdomen and pelvis showed no evidence for disease progression but MRI of the brain showed 3 questionable subcentimeter lesions concerning for metastatic disease.  For the metastatic brain lesion, I referred the patient to Dr. Tammi Klippel for consideration of stereotactic radiotherapy to these lesions.  Patient is feeling fine today with no specific complaints except for a few days of fatigue and low back pain. She is also complaining of lack of appetite and she lost around 8 pounds  IA:[id number] NO hx of radiation therapy No indication of a pacemaker

## 2014-07-12 ENCOUNTER — Encounter: Payer: Self-pay | Admitting: Radiation Oncology

## 2014-07-12 NOTE — Progress Notes (Addendum)
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 female who underwent left upper lobectomy with mediastinal lymph node dissection under the care of Dr. Roxan Hockey on December 10, 2007 for a 3.5 cm adenocarcinoma with a single positive N1 station node out of 12 sampled.  She was given a stage of pT2 N1.  She consequently received 4 cycles of adjuvant chemotherapy with cisplatin and Taxotere. Last dose was given 03/22/2008. She subsequently developed t-spine sclerotic bone metastases.  I met her then and discussed palliative radiation, but it was not needed.  Subsequently, she received 6 cycles of systemic chemotherapy with carboplatin and Alimta. The last dose was given July 02, 2010, with stable disease.  Subsequently, she is status post 12 cycles of maintenance Alimta at 500 mg/sq m given every 3 weeks status post 37 cycles with Zometa 4 mg IV given every 2 months for bone metastasis.   Re-staging brain MRI on 07/01/14 revealed three 5-6 mm lesions within the high right frontal lobe concerning for metastatic disease not present on the prior study.  In anticipation for possible stereotactic radiosurgery, she underwent 3T MRI with 1 mm axial slices. This study on 07/10/14 delineated an additional left frontal metastasis.  PREVIOUS RADIATION THERAPY: No  PAST MEDICAL HISTORY:  has a past medical history of Hypertension; HYPERLIPIDEMIA (11/05/2007); ANXIETY (05/13/2009); DEPRESSIVE DISORDER (05/06/2008); HYPERTENSION (11/05/2007); ALLERGIC RHINITIS (11/05/2007); CONSTIPATION, CHRONIC (05/13/2009); UTI (05/06/2008); BACK  PAIN (05/11/2008); OSTEOPOROSIS (11/05/2007); INSOMNIA-SLEEP DISORDER-UNSPEC (05/13/2009); FATIGUE (05/13/2009); Swelling, mass, or lump in chest (11/05/2007); LUNG CANCER, HX OF (05/06/2008); lung ca w/ bone mets (dx'd 11/2007); Lung cancer; and Brain cancer.    PAST SURGICAL HISTORY: Past Surgical History  Procedure Laterality Date  . Tubal ligation    . S/p lul lung surgury  12/2007  . Lobectomy      FAMILY HISTORY: family history includes ALS in her father; Alcohol abuse in her son; Heart disease in her mother. There is no history of Cancer.  SOCIAL HISTORY:  reports that she has never smoked. She has never used smokeless tobacco. She reports that she drinks alcohol. She reports that she does not use illicit drugs.  ALLERGIES: Amoxicillin  MEDICATIONS:  Current Outpatient Prescriptions  Medication Sig Dispense Refill  . Alum & Mag Hydroxide-Simeth (MAGIC MOUTHWASH) SOLN Take 5 mLs by mouth 4 (four) times daily. Swish and spit  240 mL  0  . Calcium Carbonate-Vitamin D (CALCIUM-VITAMIN D) 500-200 MG-UNIT per tablet Take 1 tablet by mouth 2 (two) times daily.        . Flaxseed, Linseed, (FLAXSEED OIL) 1000 MG CAPS Take 1,000 mg by mouth daily.        . folic acid (FOLVITE) 1 MG tablet Take 1 tablet (1 mg total) by mouth daily.  90 tablet  0  . HYDROcodone-acetaminophen (NORCO) 5-325 MG per tablet Take 1 tablet by mouth every 6 (six) hours as needed for moderate pain.  30 tablet  0  . LORazepam (ATIVAN) 0.5 MG tablet Take 1 tablet (0.5 mg total) by mouth every 8 (eight) hours as needed for anxiety.  Millen  tablet  1  . losartan-hydrochlorothiazide (HYZAAR) 50-12.5 MG per tablet Take 1 tablet by mouth daily.  90 tablet  3  . methylPREDNIsolone (MEDROL DOSPACK) 4 MG tablet follow package directions  21 tablet  0  . Multiple Vitamin (ANTIOXIDANT A/C/E PO) Take by mouth daily.        . Omega-3 350 MG CAPS Take by mouth daily.        . ondansetron (ZOFRAN) 8 MG tablet Take 1 tablet (8 mg total) by mouth  every 8 (eight) hours as needed for nausea or vomiting.  20 tablet  1  . polyethylene glycol powder (MIRALAX) powder Take 17 g by mouth daily.        . Probiotic Product (PROBIOTIC FORMULA) CAPS Take 1 capsule by mouth daily.        . simvastatin (ZOCOR) 40 MG tablet Take 1 tablet (40 mg total) by mouth daily.  90 tablet  3  . vitamin B-12 (CYANOCOBALAMIN) 100 MCG tablet Take 50 mcg by mouth daily.        [name] zolendronic acid (ZOMETA) 4 MG/5ML injection Inject 4 mg into the vein once.        [name] zolpidem (AMBIEN CR) 12.5 MG CR tablet TAKE 1 TABLET BY MOUTH EVERY NIGHT AT BEDTIME AS NEEDED FOR SLEEP  90 tablet  1  . aspirin 81 MG EC tablet Take 81 mg by mouth daily.        [name] azithromycin (ZITHROMAX) 250 MG tablet        No current facility-administered medications for this encounter.    REVIEW OF SYSTEMS:  A 15 point review of systems is documented in the electronic medical record. This was obtained by the nursing staff. However, I reviewed this with the patient to discuss relevant findings and make appropriate changes.  A comprehensive review of systems was negative.   PHYSICAL EXAM:  height is '5\' 5"'  (1.651 m) and weight is 132 lb (59.875 kg). Her blood pressure is 120/78 and her pulse is 70. Her respiration is 16.   Per medical oncology: General appearance: alert, cooperative and no distress  Head: Normocephalic, without obvious abnormality, atraumatic  Neck: no adenopathy  Lymph nodes: Cervical, supraclavicular, and axillary nodes normal.  Resp: clear to auscultation bilaterally  Back: symmetric, no curvature. ROM normal. No CVA tenderness.  Cardio: regular rate and rhythm, S1, S2 normal, no murmur, click, rub or gallop  GI: soft, non-tender; bowel sounds normal; no masses, no organomegaly  Extremities: extremities normal, atraumatic, no cyanosis or edema  Neurologic: Alert and oriented X 3, normal strength and tone. Normal symmetric reflexes. Normal coordination and gait I would add that the  patient was in very good spirits today with appropriate affect.  Speech, cognition and gait were grossly intact   KPS = 90  100 - Normal; no complaints; no evidence of disease. 90   - Able to carry on normal activity; minor signs or symptoms of disease. 80   - Normal activity with effort; some signs or symptoms of disease. 33   - Cares for self; unable to carry on normal activity or to do active work. 60   - Requires occasional assistance, but is able to care for most of his personal needs. 50   - Requires considerable assistance and frequent medical care. 61   - Disabled; requires special care and assistance. 47   - Severely disabled; hospital admission is indicated although death not imminent. 9   - Very sick; hospital admission  necessary; active supportive treatment necessary. 10   - Moribund; fatal processes progressing rapidly. 0     - Dead  Karnofsky DA, Abelmann Blackey, Craver LS and Burchenal [facility] [phone]) The use of the nitrogen mustards in the palliative treatment of carcinoma: with particular reference to bronchogenic carcinoma Cancer 1 [id number]  LABORATORY DATA:  Lab Results  Component Value Date   WBC 5.6 06/29/2014   HGB 12.1 06/29/2014   HCT 37.0 06/29/2014   MCV 98.0 06/29/2014   PLT 217 06/29/2014   Lab Results  Component Value Date   NA 140 06/29/2014   K 3.8 06/29/2014   CL 100 05/11/2014   CO2 29 06/29/2014   Lab Results  Component Value Date   ALT 13 06/29/2014   AST 20 06/29/2014   ALKPHOS 62 06/29/2014   BILITOT 0.44 06/29/2014     RADIOGRAPHY: Ct Chest W Contrast  06/29/2014   CLINICAL DATA:  Restaging lung cancer.  EXAM: CT CHEST, ABDOMEN, AND PELVIS WITH CONTRAST  TECHNIQUE: Multidetector CT imaging of the chest, abdomen and pelvis was performed following the standard protocol during bolus administration of intravenous contrast.  CONTRAST:  17m OMNIPAQUE IOHEXOL 300 MG/ML  SOLN  COMPARISON:  Numerous prior CT scans.  The most recent is 04/14/2014  FINDINGS: CT CHEST  FINDINGS  The chest wall is unremarkable and stable. No supraclavicular or axillary mass or adenopathy. No obvious breast masses.  Stable sclerotic bone lesions consistent with treated metastatic disease. No lytic or destructive bone lesions and no spinal canal compromise.  The heart is normal in size. No pericardial effusion. No mediastinal or hilar mass or lymphadenopathy. Small scattered lymph nodes are stable. Stable atherosclerotic changes involving the aorta but no focal aneurysm or dissection. Stable coronary artery calcifications. The esophagus is grossly normal. Stable small hiatal hernia.  The lungs are clear of acute process. Stable surgical changes involving the left hemi thorax but no findings to suggest pulmonary metastatic disease. No pleural effusion.  CT ABDOMEN AND PELVIS FINDINGS  The solid abdominal organs are unremarkable and stable. No findings for metastatic disease involving the liver or adrenal glands.  The stomach, duodenum, small bowel and colon are unremarkable. No inflammatory changes, mass lesions or obstructive findings. No mesenteric or retroperitoneal mass or adenopathy. Small scattered lymph nodes are stable. The aorta and branch vessels are patent.  The uterus and ovaries are unremarkable. The bladder appears normal. No pelvic mass, adenopathy or free pelvic fluid collections. No inguinal mass or adenopathy.  Stable sclerotic bone lesions involving the pelvis. No lytic or destructive bone lesions.  IMPRESSION: Stable CT appearance of the chest, abdomen and pelvis. No findings to suggest recurrent lung cancer or new metastatic disease.  Stable sclerotic bone lesions, likely treated metastasis.   Electronically Signed   By: MKalman JewelsM.D.   On: 06/29/2014 16:41   Mr BJeri CosWGPContrast  07/10/2014   CLINICAL DATA:  S RS restaging.  Metastatic lung cancer.  EXAM: MRI HEAD WITHOUT AND WITH CONTRAST  TECHNIQUE: Multiplanar, multiecho pulse sequences of the brain and surrounding  structures were obtained without and with intravenous contrast.  CONTRAST:  120mMULTIHANCE GADOBENATE DIMEGLUMINE 529 MG/ML IV SOLN  COMPARISON:  07/01/2014.  FINDINGS: There has been slight progression of disease since the previous study. At the right frontal vertex, a slightly patchy previously seen 4 mm lesion now appears as a more confluent 6 mm lesion with the epicenter on axial image 110. Other foci of enhancement along the  surfaces of right frontal gyri appear the same without interval growth. In the left frontal region, there is developing abnormal enhancement along the surface of a left frontal gyrus worrisome for metastatic disease in that location as well.  Elsewhere, there are chronic small-vessel ischemic changes of the cerebral hemispheric white matter. No cortical or large vessel territory infarction. No acute infarction. No hemorrhage, hydrocephalus or extra-axial collection. No pituitary mass. No inflammatory sinus disease. No skull or skullbase lesion.  IMPRESSION: Of the previously seen enhancing lesions affecting right frontal gyri on the previous study, 1 of the lesions has enlarged from a patchy 4 mm area of enhancement to a more confluent 6 mm area of enhancement. This is consistent with metastatic disease. Newly seen small focus of enhancement along a left frontal gyrus, the only new lesion demonstrated since the study of late September.   Electronically Signed   By: Nelson Chimes M.D.   On: 07/10/2014 13:53   Mr Jeri Cos PO Contrast  07/01/2014   CLINICAL DATA:  Current history of malignant neoplasm of bronchus and lung, unspecified site. Subsequent encounter for restaging.  EXAM: MRI HEAD WITHOUT AND WITH CONTRAST  TECHNIQUE: Multiplanar, multiecho pulse sequences of the brain and surrounding structures were obtained without and with intravenous contrast.  CONTRAST:  25m MULTIHANCE GADOBENATE DIMEGLUMINE 529 MG/ML IV SOLN  COMPARISON:  CT head without and with contrast 01/25/2012. MRI  brain 07/07/2008  FINDINGS: Scattered periventricular and subcortical T2 hyperintensities are slightly more prominent and numerous than on the prior exam. No acute infarct, hemorrhage, or mass lesion is present. The ventricles are proportionate to the degree of atrophy. No significant extraaxial fluid collection is present.  Flow is present in the major intracranial arteries. The patient is status post bilateral lens replacements. The globes and orbits are otherwise intact. Scattered mucosal thickening is present throughout the ethmoid air cells. There is some fluid in the mastoid air cells bilaterally. The remaining paranasal sinuses are clear.  The postcontrast images demonstrate 3 5-6 mm lesions within the high right frontal lobe. These are evident on images 44, 45, and 48 of series 11. These are concerning for metastatic disease and were not present on the prior exam. No significant left-sided lesions are evident.  IMPRESSION: 1. Three 5-6 mm lesions within the high right frontal lobe as described, concern for metastatic disease is not present on the prior study. 2. Interval increase in diffuse scattered periventricular and subcortical white matter T2 hyperintensities bilaterally. This likely reflects the sequela of chronic microvascular ischemia. 3. Ethmoid sinus disease. 4. Small bilateral mastoid effusions. No obstructing nasopharyngeal lesion is present.   Electronically Signed   By: CLawrence SantiagoM.D.   On: 07/01/2014 14:16   Ct Abdomen Pelvis W Contrast  06/29/2014   CLINICAL DATA:  Restaging lung cancer.  EXAM: CT CHEST, ABDOMEN, AND PELVIS WITH CONTRAST  TECHNIQUE: Multidetector CT imaging of the chest, abdomen and pelvis was performed following the standard protocol during bolus administration of intravenous contrast.  CONTRAST:  1068mOMNIPAQUE IOHEXOL 300 MG/ML  SOLN  COMPARISON:  Numerous prior CT scans.  The most recent is 04/14/2014  FINDINGS: CT CHEST FINDINGS  The chest wall is unremarkable  and stable. No supraclavicular or axillary mass or adenopathy. No obvious breast masses.  Stable sclerotic bone lesions consistent with treated metastatic disease. No lytic or destructive bone lesions and no spinal canal compromise.  The heart is normal in size. No pericardial effusion. No mediastinal or hilar mass or lymphadenopathy.  Small scattered lymph nodes are stable. Stable atherosclerotic changes involving the aorta but no focal aneurysm or dissection. Stable coronary artery calcifications. The esophagus is grossly normal. Stable small hiatal hernia.  The lungs are clear of acute process. Stable surgical changes involving the left hemi thorax but no findings to suggest pulmonary metastatic disease. No pleural effusion.  CT ABDOMEN AND PELVIS FINDINGS  The solid abdominal organs are unremarkable and stable. No findings for metastatic disease involving the liver or adrenal glands.  The stomach, duodenum, small bowel and colon are unremarkable. No inflammatory changes, mass lesions or obstructive findings. No mesenteric or retroperitoneal mass or adenopathy. Small scattered lymph nodes are stable. The aorta and branch vessels are patent.  The uterus and ovaries are unremarkable. The bladder appears normal. No pelvic mass, adenopathy or free pelvic fluid collections. No inguinal mass or adenopathy.  Stable sclerotic bone lesions involving the pelvis. No lytic or destructive bone lesions.  IMPRESSION: Stable CT appearance of the chest, abdomen and pelvis. No findings to suggest recurrent lung cancer or new metastatic disease.  Stable sclerotic bone lesions, likely treated metastasis.   Electronically Signed   By: Kalman Jewels M.D.   On: 06/29/2014 16:41      IMPRESSION: This patient is a very nice 73 yo woman with 4 subcentimeter brain metastases from adenocarcinoma of the left upper lung.  At this point, the patient would potentially benefit from radiotherapy. The options include whole brain irradiation  versus stereotactic radiosurgery. There are pros and cons associated with each of these potential treatment options. Whole brain radiotherapy would treat the known metastatic deposits and help provide some reduction of risk for future brain metastases. However, whole brain radiotherapy carries potential risks including hair loss, subacute somnolence, and neurocognitive changes including a possible reduction in short-term memory. Whole brain radiotherapy also may carry a lower likelihood of tumor control at the treatment sites because of the low-dose used. Stereotactic radiosurgery carries a higher likelihood for local tumor control at the targeted sites with lower associated risk for neurocognitive changes such as memory loss. However, the use of stereotactic radiosurgery in this setting may leave the patient at increased risk for new brain metastases elsewhere in the brain as high as 50-60%. Accordingly, patients who receive stereotactic radiosurgery in this setting should undergo ongoing surveillance imaging with brain MRI more frequently in order to identify and treat new small brain metastases before they become symptomatic. Stereotactic radiosurgery does carry some different risks, including a risk of radionecrosis.  PLAN: Today, I reviewed the findings and workup thus far with the patient. We discussed the dilemma regarding whole brain radiotherapy versus stereotactic radiosurgery. We discussed the pros and cons of each. We also discussed the logistics and delivery of each. We reviewed the results associated with each of the treatments described above. The patient seems to understand the treatment options and would like to potentially proceed with stereotactic radiosurgery.  However, prior to proceeding, she plans to pursue a second opinion with Dr. Vallarie Mare at [facility].  We will await her decision prior to scheduling any further interventions.  I spent 60 minutes minutes face to face with the patient and  more than 50% of that time was spent in counseling and/or coordination of care.   ------------------------------------------------  Sheral Apley. Tammi Klippel, M.D.

## 2014-07-13 ENCOUNTER — Encounter: Payer: Self-pay | Admitting: Radiation Oncology

## 2014-07-13 ENCOUNTER — Ambulatory Visit
Admission: RE | Admit: 2014-07-13 | Discharge: 2014-07-13 | Disposition: A | Discharge status: Home / Self Care | Payer: Medicare Other | Source: Ambulatory Visit | Attending: Radiation Oncology | Admitting: Radiation Oncology

## 2014-07-13 ENCOUNTER — Other Ambulatory Visit: Payer: Self-pay | Admitting: Radiation Oncology

## 2014-07-13 VITALS — BP 120/78 | HR 70 | Resp 16 | Ht 65.0 in | Wt 132.0 lb

## 2014-07-13 DIAGNOSIS — C3492 Malignant neoplasm of unspecified part of left bronchus or lung: Secondary | ICD-10-CM

## 2014-07-13 DIAGNOSIS — C7931 Secondary malignant neoplasm of brain: Secondary | ICD-10-CM

## 2014-07-13 DIAGNOSIS — Z510 Encounter for antineoplastic radiation therapy: Secondary | ICD-10-CM | POA: Insufficient documentation

## 2014-07-13 DIAGNOSIS — C3412 Malignant neoplasm of upper lobe, left bronchus or lung: Secondary | ICD-10-CM | POA: Diagnosis not present

## 2014-07-13 NOTE — Progress Notes (Signed)
See progress note under physician encounter. 

## 2014-07-13 NOTE — Progress Notes (Signed)
Here today with friend for consultation with Dr. Tammi Klippel to discuss srs to metastatic brain lesion. Reports decreased appetite. Reports 10 lb weight loss over the last three weeks. Reports taking medrol dose pack to stimulate appetite. Reports increased fatigue. Reports dull aching low back pain. Denies headache, dizziness, nausea or vomiting. Reports neuropathy in hands and feet. Denies difficulty controlling bowels or bladder. Reports taking norco at night prn back pain. Reports she takes zometa every two months.

## 2014-07-13 NOTE — Addendum Note (Signed)
Encounter addended by: Lora Paula, MD on: 07/13/2014  6:59 PM<BR>     Documentation filed: Clinical Notes, Notes Section

## 2014-07-14 ENCOUNTER — Ambulatory Visit: Admission: RE | Admit: 2014-07-14 | Payer: Medicare Other | Source: Ambulatory Visit

## 2014-07-14 ENCOUNTER — Ambulatory Visit: Payer: Medicare Other | Admitting: Radiation Oncology

## 2014-07-15 ENCOUNTER — Other Ambulatory Visit: Payer: Self-pay | Admitting: Internal Medicine

## 2014-07-15 ENCOUNTER — Other Ambulatory Visit: Payer: Self-pay | Admitting: Physician Assistant

## 2014-07-15 DIAGNOSIS — C3492 Malignant neoplasm of unspecified part of left bronchus or lung: Secondary | ICD-10-CM

## 2014-07-15 MED ORDER — HYDROCODONE-ACETAMINOPHEN 5-325 MG PO TABS: 1.0000 | ORAL_TABLET | Freq: Four times a day (QID) | ORAL | Status: DC | PRN

## 2014-07-15 MED ORDER — MAGIC MOUTHWASH: 5.0000 mL | Freq: Four times a day (QID) | ORAL | Status: DC | PRN

## 2014-07-15 NOTE — Telephone Encounter (Signed)
Notified Dannae that MMW refill sent electronically and Norco script is ready for pick up.

## 2014-07-17 ENCOUNTER — Ambulatory Visit: Payer: Medicare Other | Admitting: Radiation Oncology

## 2014-07-24 ENCOUNTER — Ambulatory Visit
Admission: RE | Admit: 2014-07-24 | Discharge: 2014-07-24 | Disposition: A | Discharge status: Home / Self Care | Payer: Medicare Other | Source: Ambulatory Visit | Attending: Radiation Oncology | Admitting: Radiation Oncology

## 2014-07-24 VITALS — BP 113/86 | HR 64 | Resp 16 | Wt 129.5 lb

## 2014-07-24 DIAGNOSIS — C7931 Secondary malignant neoplasm of brain: Secondary | ICD-10-CM

## 2014-07-24 DIAGNOSIS — Z510 Encounter for antineoplastic radiation therapy: Secondary | ICD-10-CM | POA: Diagnosis not present

## 2014-07-24 DIAGNOSIS — C3492 Malignant neoplasm of unspecified part of left bronchus or lung: Secondary | ICD-10-CM

## 2014-07-24 MED ORDER — SODIUM CHLORIDE 0.9 % IJ SOLN
10.0000 mL | Freq: Once | INTRAMUSCULAR | Status: AC
  Administered 2014-07-24: 10 mL via INTRAVENOUS

## 2014-07-24 NOTE — Progress Notes (Signed)
  Radiation Oncology         [phone]) [phone] ________________________________   Name: Tamara Ortiz MRN: [id number]  Date: 07/24/2014  DOB: Mar 01, 1941  SIMULATION AND TREATMENT PLANNING NOTE    ICD-9-CM ICD-10-CM  1. 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  Identity was confirmed.  All relevant records and images related to the planned course of therapy were reviewed.  The patient freely provided informed written consent to proceed with treatment after reviewing the details related to the planned course of therapy. The consent form was witnessed and verified by the simulation staff. Intravenous access was established for contrast administration. Then, the patient was set-up in a stable reproducible supine position for radiation therapy.  A relocatable thermoplastic stereotactic head frame was fabricated for precise immobilization.  CT images were obtained.  Surface markings were placed.  The CT images were loaded into the planning software and fused with the patient's targeting MRI scan.  Then the target and avoidance structures were contoured.  Treatment planning then occurred.  The radiation prescription was entered and confirmed.  I have requested 3D planning  I have requested a DVH of the following structures: Brain stem, brain, left eye, right eye, lenses, optic chiasm, target volumes, uninvolved brain, and normal tissue.    PLAN:  The patient will receive 20 Gy in one fraction.  ________________________________  Sheral Apley Tammi Klippel, M.D.

## 2014-07-24 NOTE — Progress Notes (Signed)
Weight and vitals stable. Patient denies pain. Reports she is allergic to amoxicillin. Patient denies being allergic to CT contrast. 06/29/2014 labs WDL. Started left AC 22 gauge IV on the first attempt. Excellent blood return and flushed without difficulty. Patient tolerated well. Patient escorted to [place] for simulation by Manuela Schwartz.

## 2014-07-24 NOTE — Progress Notes (Signed)
Removed left AC 22 gauge IV. Patient tolerated well. Catheter intact upon removal. Applied a bandaid to old IV site.

## 2014-07-27 ENCOUNTER — Encounter: Payer: Self-pay | Admitting: Nurse Practitioner

## 2014-07-27 ENCOUNTER — Other Ambulatory Visit: Payer: Self-pay | Admitting: *Deleted

## 2014-07-27 ENCOUNTER — Encounter: Payer: Self-pay | Admitting: Radiation Oncology

## 2014-07-27 ENCOUNTER — Ambulatory Visit (HOSPITAL_BASED_OUTPATIENT_CLINIC_OR_DEPARTMENT_OTHER): Payer: Medicare Other

## 2014-07-27 ENCOUNTER — Encounter: Payer: Self-pay | Admitting: Internal Medicine

## 2014-07-27 ENCOUNTER — Ambulatory Visit (HOSPITAL_BASED_OUTPATIENT_CLINIC_OR_DEPARTMENT_OTHER): Payer: Medicare Other | Admitting: Nurse Practitioner

## 2014-07-27 ENCOUNTER — Telehealth: Payer: Self-pay | Admitting: Medical Oncology

## 2014-07-27 ENCOUNTER — Ambulatory Visit: Payer: Medicare Other | Admitting: Internal Medicine

## 2014-07-27 VITALS — BP 121/72 | HR 60 | Temp 98.6°F | Resp 18 | Ht 65.0 in | Wt 128.8 lb

## 2014-07-27 DIAGNOSIS — C7951 Secondary malignant neoplasm of bone: Secondary | ICD-10-CM

## 2014-07-27 DIAGNOSIS — R112 Nausea with vomiting, unspecified: Secondary | ICD-10-CM

## 2014-07-27 DIAGNOSIS — E860 Dehydration: Secondary | ICD-10-CM

## 2014-07-27 DIAGNOSIS — C3492 Malignant neoplasm of unspecified part of left bronchus or lung: Secondary | ICD-10-CM

## 2014-07-27 DIAGNOSIS — Z510 Encounter for antineoplastic radiation therapy: Secondary | ICD-10-CM | POA: Diagnosis not present

## 2014-07-27 DIAGNOSIS — R634 Abnormal weight loss: Secondary | ICD-10-CM | POA: Insufficient documentation

## 2014-07-27 DIAGNOSIS — C7931 Secondary malignant neoplasm of brain: Secondary | ICD-10-CM

## 2014-07-27 DIAGNOSIS — J069 Acute upper respiratory infection, unspecified: Secondary | ICD-10-CM

## 2014-07-27 DIAGNOSIS — R630 Anorexia: Secondary | ICD-10-CM | POA: Insufficient documentation

## 2014-07-27 LAB — COMPREHENSIVE METABOLIC PANEL (CC13)
ALK PHOS: 64 U/L (ref 40–150)
ALT: 20 U/L (ref 0–55)
AST: 21 U/L (ref 5–34)
Albumin: 3.6 g/dL (ref 3.5–5.0)
Anion Gap: 9 mEq/L (ref 3–11)
BILIRUBIN TOTAL: 0.42 mg/dL (ref 0.20–1.20)
BUN: 9.8 mg/dL (ref 7.0–26.0)
CO2: 30 mEq/L — ABNORMAL HIGH (ref 22–29)
Calcium: 10 mg/dL (ref 8.4–10.4)
Chloride: 103 mEq/L (ref 98–109)
Creatinine: 1 mg/dL (ref 0.6–1.1)
GLUCOSE: 116 mg/dL (ref 70–140)
Potassium: 3.6 mEq/L (ref 3.5–5.1)
SODIUM: 142 meq/L (ref 136–145)
Total Protein: 6.5 g/dL (ref 6.4–8.3)

## 2014-07-27 LAB — CBC WITH DIFFERENTIAL/PLATELET
BASO%: 0.4 % (ref 0.0–2.0)
Basophils Absolute: 0 10*3/uL (ref 0.0–0.1)
EOS%: 0.6 % (ref 0.0–7.0)
Eosinophils Absolute: 0 10*3/uL (ref 0.0–0.5)
HCT: 37.2 % (ref 34.8–46.6)
HGB: 12.3 g/dL (ref 11.6–15.9)
LYMPH%: 28 % (ref 14.0–49.7)
MCH: 32.4 pg (ref 25.1–34.0)
MCHC: 33.1 g/dL (ref 31.5–36.0)
MCV: 97.9 fL (ref 79.5–101.0)
MONO#: 0.5 10*3/uL (ref 0.1–0.9)
MONO%: 9.6 % (ref 0.0–14.0)
NEUT#: 3.3 10*3/uL (ref 1.5–6.5)
NEUT%: 61.4 % (ref 38.4–76.8)
Platelets: 213 10*3/uL (ref 145–400)
RBC: 3.8 10*6/uL (ref 3.70–5.45)
RDW: 12.6 % (ref 11.2–14.5)
WBC: 5.3 10*3/uL (ref 3.9–10.3)
lymph#: 1.5 10*3/uL (ref 0.9–3.3)

## 2014-07-27 MED ORDER — SODIUM CHLORIDE 0.9 % IV SOLN
Freq: Once | INTRAVENOUS | Status: DC
  Administered 2014-07-27: 16:00:00 via INTRAVENOUS

## 2014-07-27 MED ORDER — AZITHROMYCIN 250 MG PO TABS: ORAL_TABLET | ORAL | Status: DC

## 2014-07-27 NOTE — Progress Notes (Signed)
SYMPTOM MANAGEMENT CLINIC   HPI: Tamara Ortiz 73 y.o. female diagnosed with lung cancer.  Currently undergoing Alimta chemotherapy regimen; as well as Zometa on an every two-month basis for bone metastasis.  Scheduled for stereotactic radiotherapy on 07/30/2014.     Patient called the cancer Center today requesting urgent care visit.  Patient states that she last received Alimta chemotherapy and Zometa infusion on 07/06/2014.  She has developed some URI symptoms which include headache, nasal congestion, sinus drainage, ear fullness, and some facial tenderness over this past weekend.  She denies any cough or shortness of breath.  She denies any fevers or chills.  Patient also experienced 3 episodes of nausea/vomiting I just yesterday afternoon 07/26/2014.  She feels fairly dehydrated; his requested IV fluid rehydration.  She also is complaining of minimal appetite; and continues to lose weight.  HPI  CURRENT THERAPY: Upcoming Treatment Dates - LUNG Pemetrexed (Alimta) q21d  Days with orders from any treatment category:  08/03/2014      SCHEDULING COMMUNICATION      ondansetron (ZOFRAN) IVPB 8 mg      dexamethasone (DECADRON) injection 10 mg      PEMEtrexed (ALIMTA) 875 mg in sodium chloride 0.9 % 100 mL chemo infusion      sodium chloride 0.9 % injection 10 mL      heparin lock flush 100 unit/mL      heparin lock flush 100 unit/mL      alteplase (CATHFLO ACTIVASE) injection 2 mg      sodium chloride 0.9 % injection 3 mL      0.9 %  sodium chloride infusion      TREATMENT CONDITIONS 08/31/2014      SCHEDULING COMMUNICATION      ondansetron (ZOFRAN) IVPB 8 mg      dexamethasone (DECADRON) injection 10 mg      PEMEtrexed (ALIMTA) 875 mg in sodium chloride 0.9 % 100 mL chemo infusion      sodium chloride 0.9 % injection 10 mL      heparin lock flush 100 unit/mL      heparin lock flush 100 unit/mL      alteplase (CATHFLO ACTIVASE) injection 2 mg      sodium chloride 0.9 %  injection 3 mL      0.9 %  sodium chloride infusion      TREATMENT CONDITIONS 09/28/2014      SCHEDULING COMMUNICATION      ondansetron (ZOFRAN) IVPB 8 mg      dexamethasone (DECADRON) injection 10 mg      PEMEtrexed (ALIMTA) 875 mg in sodium chloride 0.9 % 100 mL chemo infusion      sodium chloride 0.9 % injection 10 mL      heparin lock flush 100 unit/mL      heparin lock flush 100 unit/mL      alteplase (CATHFLO ACTIVASE) injection 2 mg      sodium chloride 0.9 % injection 3 mL      0.9 %  sodium chloride infusion      TREATMENT CONDITIONS    ROS  Past Medical History  Diagnosis Date  . Hypertension   . HYPERLIPIDEMIA 11/05/2007  . ANXIETY 05/13/2009  . DEPRESSIVE DISORDER 05/06/2008  . HYPERTENSION 11/05/2007  . ALLERGIC RHINITIS 11/05/2007  . CONSTIPATION, CHRONIC 05/13/2009  . UTI 05/06/2008  . BACK PAIN 05/11/2008  . OSTEOPOROSIS 11/05/2007  . INSOMNIA-SLEEP DISORDER-UNSPEC 05/13/2009  . FATIGUE 05/13/2009  . Swelling, mass, or lump in chest 11/05/2007  .  LUNG CANCER, HX OF 05/06/2008  . 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 Lung cancer   . Brain cancer     Past Surgical History  Procedure Laterality Date  . Tubal ligation    . S/p lul lung surgury  12/2007  . Lobectomy      has Non-small cell cancer of left lung; HYPERLIPIDEMIA; ANXIETY; DEPRESSIVE DISORDER; HYPERTENSION; ALLERGIC RHINITIS; CONSTIPATION, CHRONIC; BACK PAIN; OSTEOPOROSIS; INSOMNIA-SLEEP DISORDER-UNSPEC; Left knee pain; Preventative health care; Leg ulcer; Bone metastases; Diarrhea; Carpal tunnel syndrome, bilateral; Nausea with vomiting; Cancer; Dehydration; URI (upper respiratory infection); Anorexia; Weight loss; and Brain metastasis on her problem list.     is allergic to amoxicillin.    Medication List       This list is accurate as of: 07/27/14  2:22 PM.  Always use your most recent med list.               ANTIOXIDANT A/C/E PO  Take by mouth daily.     aspirin 81 MG EC tablet    Take 81 mg by mouth daily.     azithromycin 250 MG tablet  Commonly known as:  ZITHROMAX Z-PAK  Take 2 tabs (500 mg) PO QD on first day; then take 1 tab PO QD til gone.     calcium-vitamin D 500-200 MG-UNIT per tablet  Take 1 tablet by mouth 2 (two) times daily.     Flaxseed Oil 1000 MG Caps  Take 1,000 mg by mouth daily.     folic acid 1 MG tablet  Commonly known as:  FOLVITE  Take 1 tablet (1 mg total) by mouth daily.     HYDROcodone-acetaminophen 5-325 MG per tablet  Commonly known as:  NORCO  Take 1 tablet by mouth every 6 (six) hours as needed for moderate pain.     LORazepam 0.5 MG tablet  Commonly known as:  ATIVAN  Take 1 tablet (0.5 mg total) by mouth every 8 (eight) hours as needed for anxiety.     losartan-hydrochlorothiazide 50-12.5 MG per tablet  Commonly known as:  HYZAAR  Take 1 tablet by mouth daily.     magic mouthwash Soln  Take 5 mLs by mouth 4 (four) times daily as needed for mouth pain. Swish and spit     MIRALAX powder  Generic drug:  polyethylene glycol powder  Take 17 g by mouth daily.     Omega-3 350 MG Caps  Take by mouth daily.     ondansetron 8 MG tablet  Commonly known as:  ZOFRAN  Take 1 tablet (8 mg total) by mouth every 8 (eight) hours as needed for nausea or vomiting.     PROBIOTIC FORMULA Caps  Take 1 capsule by mouth daily.     simvastatin 40 MG tablet  Commonly known as:  ZOCOR  Take 1 tablet (40 mg total) by mouth daily.     zolpidem 12.5 MG CR tablet  Commonly known as:  AMBIEN CR  TAKE 1 TABLET BY MOUTH EVERY NIGHT AT BEDTIME AS NEEDED FOR SLEEP     ZOMETA 4 MG/5ML injection  Generic drug:  zolendronic acid  Inject 4 mg into the vein once.         PHYSICAL EXAMINATION  Blood pressure 121/72, pulse 60, temperature 98.6 F (37 C), temperature source Oral, resp. rate 18, height 5\' 5"  (1.651 m), weight 128 lb 12.8 oz (58.423 kg), SpO2 100.00%.  Physical Exam  Nursing  note and vitals reviewed. Constitutional: She  is oriented to person, place, and time. Vital signs are normal. She appears dehydrated. She appears unhealthy.  HENT:  Head: Normocephalic and atraumatic.  Mouth/Throat: Oropharynx is clear and moist.  Bilateral ears with dried serum and noted.  Right TM with effusion noted.  Patient does wear bilateral hearing aids as baseline.  No specific nasal congestion noted on exam.  Maxillary facial tenderness with palpation bilaterally.  Eyes: Conjunctivae and EOM are normal. Pupils are equal, round, and reactive to light. No scleral icterus.  Neck: Normal range of motion. Neck supple. No JVD present. No tracheal deviation present. No thyromegaly present.  Cardiovascular: Normal rate, regular rhythm, normal heart sounds and intact distal pulses.   Pulmonary/Chest: Effort normal and breath sounds normal. No respiratory distress. She has no wheezes. She has no rales.  Abdominal: Soft. Bowel sounds are normal. She exhibits no distension. There is no tenderness. There is no rebound.  Musculoskeletal: Normal range of motion. She exhibits no edema and no tenderness.  Lymphadenopathy:    She has no cervical adenopathy.  Neurological: She is alert and oriented to person, place, and time. Gait normal.  Skin: Skin is warm and dry. No rash noted. No erythema.  Psychiatric: Affect normal.    LABORATORY DATA:. Appointment on 07/27/2014  Component Date Value Ref Range Status  . WBC 07/27/2014 5.3  3.9 - 10.3 10e3/uL Final  . NEUT# 07/27/2014 3.3  1.5 - 6.5 10e3/uL Final  . HGB 07/27/2014 12.3  11.6 - 15.9 g/dL Final  . HCT 07/27/2014 37.2  34.8 - 46.6 % Final  . Platelets 07/27/2014 213  145 - 400 10e3/uL Final  . MCV 07/27/2014 97.9  79.5 - 101.0 fL Final  . MCH 07/27/2014 32.4  25.1 - 34.0 pg Final  . MCHC 07/27/2014 33.1  31.5 - 36.0 g/dL Final  . RBC 07/27/2014 3.80  3.70 - 5.45 10e6/uL Final  . RDW 07/27/2014 12.6  11.2 - 14.5 % Final  . lymph# 07/27/2014 1.5  0.9 - 3.3 10e3/uL Final  . MONO#  07/27/2014 0.5  0.1 - 0.9 10e3/uL Final  . Eosinophils Absolute 07/27/2014 0.0  0.0 - 0.5 10e3/uL Final  . Basophils Absolute 07/27/2014 0.0  0.0 - 0.1 10e3/uL Final  . NEUT% 07/27/2014 61.4  38.4 - 76.8 % Final  . LYMPH% 07/27/2014 28.0  14.0 - 49.7 % Final  . MONO% 07/27/2014 9.6  0.0 - 14.0 % Final  . EOS% 07/27/2014 0.6  0.0 - 7.0 % Final  . BASO% 07/27/2014 0.4  0.0 - 2.0 % Final  . Sodium 07/27/2014 142  136 - 145 mEq/L Final  . Potassium 07/27/2014 3.6  3.5 - 5.1 mEq/L Final  . Chloride 07/27/2014 103  98 - 109 mEq/L Final  . CO2 07/27/2014 30* 22 - 29 mEq/L Final  . Glucose 07/27/2014 116  70 - 140 mg/dl Final  . BUN 07/27/2014 9.8  7.0 - 26.0 mg/dL Final  . Creatinine 07/27/2014 1.0  0.6 - 1.1 mg/dL Final  . Total Bilirubin 07/27/2014 0.42  0.20 - 1.20 mg/dL Final  . Alkaline Phosphatase 07/27/2014 64  40 - 150 U/L Final  . AST 07/27/2014 21  5 - 34 U/L Final  . ALT 07/27/2014 20  0 - 55 U/L Final  . Total Protein 07/27/2014 6.5  6.4 - 8.3 g/dL Final  . Albumin 07/27/2014 3.6  3.5 - 5.0 g/dL Final  . Calcium 07/27/2014 10.0  8.4 - 10.4 mg/dL Final  .  Anion Gap 07/27/2014 9  3 - 11 mEq/L Final     RADIOGRAPHIC STUDIES: No results found.  ASSESSMENT/PLAN:    Non-small cell cancer of left lung  Assessment & Plan Patient last received her Alimta chemotherapy on 07/06/2014.  She will be due for her next cycle of the same regimen on 08/03/2014.   Bone metastases  Assessment & Plan Patient received Zometa on an every two-month basis for previously diagnosed bone metastasis.  She last received Zometa on 07/06/2014; and will be due again for Zometa on 09/04/2014.   Nausea with vomiting  Assessment & Plan Patient reports she did vomit 3 times yesterday afternoon.  This could very well be a viral illness; but could also possibly be related to her recently diagnosed brain metastasis.  Patient states that she does have anti-nausea medications already at home; does not need a  refill.   Dehydration  Assessment & Plan Patient states that she only vomited 3 times yesterday; but has been able to keep down some fluids today.  She does feel slightly dehydrated.  Patient will receive 1 L IV fluid rehydration today.   URI (upper respiratory infection)  Assessment & Plan Patient is complaining of a headache and 3 episode just today of nausea/vomiting.  She is also complaining of some ear fullness, nasal congestion, and some facial tenderness in the maxillary region.  She denies any fevers or chills.  All of patient's headache and nausea/vomiting could be associated with recently diagnosed brain metastasis; it could also be symptoms of mild sinusitis/URI.  Patient states that she does have her propensity to develop sinusitis symptoms in the past; and as always typically taken Zithromax.  Patient was prescribed a Z-Pak today.  Also advised she could try over-the-counter Mucinex and saline nasal spray.   Anorexia  Assessment & Plan Patient is complaining of continued minimal appetite.  Patient was encouraged to eat multiple small meals throughout the day; and to push protein.   Weight loss  Assessment & Plan Patient continues with minimal appetite; has lost another 3 pounds since her last weight check.  Patient was encouraged to eat multiple small meals throughout the day as possible.   Brain metastasis  Assessment & Plan Patient has recently been diagnosed with brain metastasis.  She has already had consultation with Dr. Tammi Klippel radiation oncologist; and has plans for stereotactic radiotherapy this Thursday, 07/30/2014.   Patient stated understanding of all instructions; and was in agreement with this plan of care. The patient knows to call the clinic with any problems, questions or concerns.   Review/collaboration with Dr.  Julien Nordmann  regarding all aspects of patient's visit today.   Total time spent with patient was 25  minutes;  with greater than  75  percent  of that time spent in face to face counseling regarding  her symptoms  and coordination of care and follow up.  Disclaimer: This note was dictated with voice recognition software. Similar sounding words can inadvertently be transcribed and may not be corrected upon review.   Drue Second, NP 07/27/2014

## 2014-07-27 NOTE — Assessment & Plan Note (Signed)
Patient received Zometa on an every two-month basis for previously diagnosed bone metastasis.  She last received Zometa on 07/06/2014; and will be due again for Zometa on 09/04/2014.

## 2014-07-27 NOTE — Assessment & Plan Note (Signed)
Patient states that she only vomited 3 times yesterday; but has been able to keep down some fluids today.  She does feel slightly dehydrated.  Patient will receive 1 L IV fluid rehydration today.

## 2014-07-27 NOTE — Assessment & Plan Note (Signed)
Patient last received her Alimta chemotherapy on 07/06/2014.  She will be due for her next cycle of the same regimen on 08/03/2014.

## 2014-07-27 NOTE — Assessment & Plan Note (Signed)
Patient has recently been diagnosed with brain metastasis.  She has already had consultation with Dr. Tammi Klippel radiation oncologist; and has plans for stereotactic radiotherapy this Thursday, 07/30/2014.

## 2014-07-27 NOTE — Patient Instructions (Signed)
Dehydration, Adult Dehydration is when you lose more fluids from the body than you take in. Vital organs like the kidneys, brain, and heart cannot function without a proper amount of fluids and salt. Any loss of fluids from the body can cause dehydration.  CAUSES   Vomiting.  Diarrhea.  Excessive sweating.  Excessive urine output.  Fever. SYMPTOMS  Mild dehydration  Thirst.  Dry lips.  Slightly dry mouth. Moderate dehydration  Very dry mouth.  Sunken eyes.  Skin does not bounce back quickly when lightly pinched and released.  Dark urine and decreased urine production.  Decreased tear production.  Headache. Severe dehydration  Very dry mouth.  Extreme thirst.  Rapid, weak pulse (more than 100 beats per minute at rest).  Cold hands and feet.  Not able to sweat in spite of heat and temperature.  Rapid breathing.  Blue lips.  Confusion and lethargy.  Difficulty being awakened.  Minimal urine production.  No tears. DIAGNOSIS  Your caregiver will diagnose dehydration based on your symptoms and your exam. Blood and urine tests will help confirm the diagnosis. The diagnostic evaluation should also identify the cause of dehydration. TREATMENT  Treatment of mild or moderate dehydration can often be done at home by increasing the amount of fluids that you drink. It is best to drink small amounts of fluid more often. Drinking too much at one time can make vomiting worse. Refer to the home care instructions below. Severe dehydration needs to be treated at the hospital where you will probably be given intravenous (IV) fluids that contain water and electrolytes. HOME CARE INSTRUCTIONS   Ask your caregiver about specific rehydration instructions.  Drink enough fluids to keep your urine clear or pale yellow.  Drink small amounts frequently if you have nausea and vomiting.  Eat as you normally do.  Avoid:  Foods or drinks high in sugar.  Carbonated  drinks.  Juice.  Extremely hot or cold fluids.  Drinks with caffeine.  Fatty, greasy foods.  Alcohol.  Tobacco.  Overeating.  Gelatin desserts.  Wash your hands well to avoid spreading bacteria and viruses.  Only take over-the-counter or prescription medicines for pain, discomfort, or fever as directed by your caregiver.  Ask your caregiver if you should continue all prescribed and over-the-counter medicines.  Keep all follow-up appointments with your caregiver. SEEK MEDICAL CARE IF:  You have abdominal pain and it increases or stays in one area (localizes).  You have a rash, stiff neck, or severe headache.  You are irritable, sleepy, or difficult to awaken.  You are weak, dizzy, or extremely thirsty. SEEK IMMEDIATE MEDICAL CARE IF:   You are unable to keep fluids down or you get worse despite treatment.  You have frequent episodes of vomiting or diarrhea.  You have blood or green matter (bile) in your vomit.  You have blood in your stool or your stool looks black and tarry.  You have not urinated in 6 to 8 hours, or you have only urinated a small amount of very dark urine.  You have a fever.  You faint. MAKE SURE YOU:   Understand these instructions.  Will watch your condition.  Will get help right away if you are not doing well or get worse. Document Released: 09/18/2005 Document Revised: 12/11/2011 Document Reviewed: 05/08/2011 ExitCare Patient Information 2015 [employer]. This information is not intended to replace advice given to you by your health care provider. Make sure you discuss any questions you have with your health care   provider.  

## 2014-07-27 NOTE — Telephone Encounter (Signed)
Labs , appt and IVF

## 2014-07-27 NOTE — Assessment & Plan Note (Signed)
Patient is complaining of continued minimal appetite.  Patient was encouraged to eat multiple small meals throughout the day; and to push protein.

## 2014-07-27 NOTE — Assessment & Plan Note (Signed)
Patient reports she did vomit 3 times yesterday afternoon.  This could very well be a viral illness; but could also possibly be related to her recently diagnosed brain metastasis.  Patient states that she does have anti-nausea medications already at home; does not need a refill.

## 2014-07-27 NOTE — Assessment & Plan Note (Signed)
Patient is complaining of a headache and 3 episode just today of nausea/vomiting.  She is also complaining of some ear fullness, nasal congestion, and some facial tenderness in the maxillary region.  She denies any fevers or chills.  All of patient's headache and nausea/vomiting could be associated with recently diagnosed brain metastasis; it could also be symptoms of mild sinusitis/URI.  Patient states that she does have her propensity to develop sinusitis symptoms in the past; and as always typically taken Zithromax.  Patient was prescribed a Z-Pak today.  Also advised she could try over-the-counter Mucinex and saline nasal spray.

## 2014-07-27 NOTE — Telephone Encounter (Signed)
Returned pts call. She repeorts headache and vomiting this weekend-Sunday after lunch.. She feels bad. Pain over her sinus and back of head. She is not sure if it is a sinus problem or her brain metastasis. She is scheduled for [facility] Thursday. Not eating well.

## 2014-07-27 NOTE — Assessment & Plan Note (Signed)
Patient continues with minimal appetite; has lost another 3 pounds since her last weight check.  Patient was encouraged to eat multiple small meals throughout the day as possible.

## 2014-07-27 NOTE — Addendum Note (Signed)
Addended by: Ardeen Garland on: 07/27/2014 10:05 AM   Modules accepted: Orders

## 2014-07-29 ENCOUNTER — Other Ambulatory Visit: Payer: Self-pay | Admitting: Nurse Practitioner

## 2014-07-30 ENCOUNTER — Encounter: Payer: Self-pay | Admitting: Radiation Oncology

## 2014-07-30 ENCOUNTER — Ambulatory Visit
Admission: RE | Admit: 2014-07-30 | Discharge: 2014-07-30 | Disposition: A | Discharge status: Home / Self Care | Payer: Medicare Other | Source: Ambulatory Visit | Attending: Radiation Oncology | Admitting: Radiation Oncology

## 2014-07-30 VITALS — BP 128/78 | HR 66 | Temp 97.8°F | Resp 12

## 2014-07-30 DIAGNOSIS — Z510 Encounter for antineoplastic radiation therapy: Secondary | ICD-10-CM | POA: Diagnosis not present

## 2014-07-30 DIAGNOSIS — C7931 Secondary malignant neoplasm of brain: Secondary | ICD-10-CM

## 2014-07-30 NOTE — Progress Notes (Signed)
  Radiation Oncology         [phone]) [phone] ________________________________  Stereotactic Treatment Procedure Note  Name: Tamara Ortiz MRN: [id number]  Date: 07/30/2014  DOB: December 25, 1940  SPECIAL TREATMENT PROCEDURE    ICD-9-CM ICD-10-CM  1. Brain metastasis 198.3 C79.31    3D TREATMENT PLANNING AND DOSIMETRY:  The patient's radiation plan was reviewed and approved by neurosurgery and radiation oncology prior to treatment.  It showed 3-dimensional radiation distributions overlaid onto the planning CT/MRI image set.  [facility] for the target structures as well as the organs at risk were reviewed. The documentation of the 3D plan and dosimetry are filed in the radiation oncology EMR.  NARRATIVE:  Tamara Ortiz was brought to the TrueBeam stereotactic radiation treatment machine and placed supine on the CT couch. The head frame was applied, and the patient was set up for stereotactic radiosurgery.  Neurosurgery was present for the set-up and delivery  SIMULATION VERIFICATION:  In the couch zero-angle position, the patient underwent Exactrac imaging using the Brainlab system with orthogonal KV images.  These were carefully aligned and repeated to confirm treatment position for each of the isocenters.  The Exactrac snap film verification was repeated at each couch angle.  SPECIAL TREATMENT PROCEDURE: Tamara Ortiz received stereotactic radiosurgery to the following targets: Right frontal 5.7 mm target was treated using 3 Dynamic Conformal Arcs to a prescription dose of 20 Gy.  ExacTrac registration was performed for each couch angle.  The 79.4% isodose line was prescribed. Right frontal 5.4 mm target was treated using 3 Dynamic Conformal Arcs to a prescription dose of 20 Gy.  ExacTrac registration was performed for each couch angle.  The 78.7% isodose line was prescribed. Right frontal 5 mm target was treated using 3 Dynamic Conformal Arcs to a prescription dose of 20 Gy.  ExacTrac registration was  performed for each couch angle.  The 78.7% isodose line was prescribed. Left frontal 2 mm target was treated using 3 Circular Arcs to a prescription dose of 20 Gy.  ExacTrac registration was performed for each couch angle.  The 69% isodose line was prescribed.  STEREOTACTIC TREATMENT MANAGEMENT:  Following delivery, the patient was transported to nursing in stable condition and monitored for possible acute effects.  Vital signs were recorded BP 128/78  Pulse 66  Temp(Src) 97.8 F (36.6 C) (Oral)  Resp 12  SpO2 98%. The patient tolerated treatment without significant acute effects, and was discharged to home in stable condition.    PLAN: Follow-up in one month.  ________________________________  Sheral Apley. Tammi Klippel, M.D.

## 2014-07-30 NOTE — Progress Notes (Signed)
Pt here post [facility] treatment.  Pt here for a full hour. Initially patient reports having some dull jaw pain (rated a 3 on 0-10 scale) due to wear the treatment mask.  Prior to exiting the nursing unit pain reassessed and rated a 0.  Pt denies headache, nausea, vomiting, visual or auditory abnormalities.  Denies feelings of vertigo or "light headed."  Pt not taking Decadron.

## 2014-07-30 NOTE — Op Note (Signed)
Name: Rylinn Linzy    MRN: [id number]   Date: 07/30/2014    DOB: 07-30-1941   STEREOTACTIC RADIOSURGERY OPERATIVE NOTE  PRE-OPERATIVE DIAGNOSIS:  Metastatic lung CA  POST-OPERATIVE DIAGNOSIS:  Same  PROCEDURE:  Stereotactic Radiosurgery  SURGEON:  Consuella Lose, MD  RADIATION ONCOLOGIST: Dr. Tyler Pita, MD  TECHNIQUE:  The patient underwent a radiation treatment planning session in the radiation oncology simulation suite under the care of the radiation oncology physician and physicist.  I participated closely in the radiation treatment planning afterwards. The patient underwent planning CT which was fused to 3T high resolution MRI with 1 mm axial slices.  These images were fused on the planning system.  We contoured the gross target volumes and subsequently expanded this to yield the Planning Target Volume. I actively participated in the planning process.  I helped to define and review the target contours and also the contours of the optic pathway, eyes, brainstem and selected nearby organs at risk.  All the dose constraints for critical structures were reviewed and compared to AAPM Task Group 101.  The prescription dose conformity was reviewed.  I approved the plan electronically.    Accordingly, Valeta Paz  was brought to the TrueBeam stereotactic radiation treatment linac and placed in the custom immobilization mask.  The patient was aligned according to the IR fiducial markers with BrainLab Exactrac, then orthogonal x-rays were used in ExacTrac with the 6DOF robotic table and the shifts were made to align the patient  Melynda Keller received stereotactic radiosurgery to a prescription dose of 20Gy to all lesions (3 right frontal, one left frontal) uneventfully.    The detailed description of the procedure is recorded in the radiation oncology procedure note.  I was present for the duration of the procedure.  DISPOSITION:   Following delivery, the patient was transported to  nursing in stable condition and monitored for possible acute effects to be discharged to home in stable condition with follow-up in one month.  Consuella Lose, MD [facility]

## 2014-08-02 NOTE — Progress Notes (Signed)
  Radiation Oncology         [phone]) [phone] ________________________________  Name: Tamara Ortiz MRN: [id number]  Date: 07/30/2014  DOB: [birth year]  End of Treatment Note    ICD-9-CM ICD-10-CM  1. 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 ExacTrac registration was performed for each couch angle. The 79.4% isodose line was prescribed.  Right frontal 5.4 mm target was treated using 3 Dynamic Conformal Arcs to a prescription dose of 20 Gy. ExacTrac registration was performed for each couch angle. The 78.7% isodose line was prescribed.  Right frontal 5 mm target was treated using 3 Dynamic Conformal Arcs to a prescription dose of 20 Gy. ExacTrac registration was performed for each couch angle. The 78.7% isodose line was prescribed.  Left frontal 2 mm target was treated using 3 Circular Arcs to a prescription dose of 20 Gy. ExacTrac registration was performed for each couch angle. The 69% isodose line was prescribed.  Beams/energy:   6 MV X-rays were delivered in the flattening filter free mode.  Narrative: The patient tolerated radiation treatment relatively well.   Plan: The patient has completed radiation treatment. The patient will return to radiation oncology clinic for routine followup in one month. I advised them to call or return sooner if they have any questions or concerns related to their recovery or treatment. ________________________________  Sheral Apley. Tammi Klippel, M.D.

## 2014-08-03 ENCOUNTER — Ambulatory Visit: Payer: Medicare Other | Admitting: Nutrition

## 2014-08-03 ENCOUNTER — Telehealth: Payer: Self-pay | Admitting: Internal Medicine

## 2014-08-03 ENCOUNTER — Ambulatory Visit (HOSPITAL_BASED_OUTPATIENT_CLINIC_OR_DEPARTMENT_OTHER): Payer: Medicare Other

## 2014-08-03 ENCOUNTER — Other Ambulatory Visit: Payer: Self-pay | Admitting: *Deleted

## 2014-08-03 ENCOUNTER — Encounter: Payer: Self-pay | Admitting: Internal Medicine

## 2014-08-03 ENCOUNTER — Other Ambulatory Visit (HOSPITAL_BASED_OUTPATIENT_CLINIC_OR_DEPARTMENT_OTHER): Payer: Medicare Other

## 2014-08-03 ENCOUNTER — Ambulatory Visit (HOSPITAL_BASED_OUTPATIENT_CLINIC_OR_DEPARTMENT_OTHER): Payer: Medicare Other | Admitting: Internal Medicine

## 2014-08-03 VITALS — BP 132/73 | HR 55 | Temp 98.1°F | Resp 18 | Ht 65.0 in | Wt 131.2 lb

## 2014-08-03 DIAGNOSIS — C3412 Malignant neoplasm of upper lobe, left bronchus or lung: Secondary | ICD-10-CM

## 2014-08-03 DIAGNOSIS — C7931 Secondary malignant neoplasm of brain: Secondary | ICD-10-CM

## 2014-08-03 DIAGNOSIS — C7951 Secondary malignant neoplasm of bone: Secondary | ICD-10-CM

## 2014-08-03 DIAGNOSIS — C3490 Malignant neoplasm of unspecified part of unspecified bronchus or lung: Secondary | ICD-10-CM

## 2014-08-03 DIAGNOSIS — C3492 Malignant neoplasm of unspecified part of left bronchus or lung: Secondary | ICD-10-CM

## 2014-08-03 DIAGNOSIS — Z5111 Encounter for antineoplastic chemotherapy: Secondary | ICD-10-CM

## 2014-08-03 DIAGNOSIS — C801 Malignant (primary) neoplasm, unspecified: Secondary | ICD-10-CM

## 2014-08-03 LAB — CBC WITH DIFFERENTIAL/PLATELET
BASO%: 0.9 % (ref 0.0–2.0)
BASOS ABS: 0 10*3/uL (ref 0.0–0.1)
EOS%: 0.9 % (ref 0.0–7.0)
Eosinophils Absolute: 0 10*3/uL (ref 0.0–0.5)
HEMATOCRIT: 39.3 % (ref 34.8–46.6)
HGB: 13 g/dL (ref 11.6–15.9)
LYMPH#: 1.4 10*3/uL (ref 0.9–3.3)
LYMPH%: 33.7 % (ref 14.0–49.7)
MCH: 32.3 pg (ref 25.1–34.0)
MCHC: 33.1 g/dL (ref 31.5–36.0)
MCV: 97.7 fL (ref 79.5–101.0)
MONO#: 0.5 10*3/uL (ref 0.1–0.9)
MONO%: 10.8 % (ref 0.0–14.0)
NEUT#: 2.3 10*3/uL (ref 1.5–6.5)
NEUT%: 53.7 % (ref 38.4–76.8)
Platelets: 264 10*3/uL (ref 145–400)
RBC: 4.02 10*6/uL (ref 3.70–5.45)
RDW: 12.7 % (ref 11.2–14.5)
WBC: 4.2 10*3/uL (ref 3.9–10.3)

## 2014-08-03 LAB — COMPREHENSIVE METABOLIC PANEL (CC13)
ALT: 19 U/L (ref 0–55)
ANION GAP: 9 meq/L (ref 3–11)
AST: 19 U/L (ref 5–34)
Albumin: 3.7 g/dL (ref 3.5–5.0)
Alkaline Phosphatase: 70 U/L (ref 40–150)
BILIRUBIN TOTAL: 0.38 mg/dL (ref 0.20–1.20)
BUN: 10.6 mg/dL (ref 7.0–26.0)
CALCIUM: 10.2 mg/dL (ref 8.4–10.4)
CHLORIDE: 106 meq/L (ref 98–109)
CO2: 25 meq/L (ref 22–29)
CREATININE: 1 mg/dL (ref 0.6–1.1)
GLUCOSE: 81 mg/dL (ref 70–140)
Potassium: 4.2 mEq/L (ref 3.5–5.1)
Sodium: 140 mEq/L (ref 136–145)
Total Protein: 6.8 g/dL (ref 6.4–8.3)

## 2014-08-03 MED ORDER — ONDANSETRON 8 MG/50ML IVPB (CHCC)
8.0000 mg | Freq: Once | INTRAVENOUS | Status: AC
  Administered 2014-08-03: 8 mg via INTRAVENOUS

## 2014-08-03 MED ORDER — DEXAMETHASONE SODIUM PHOSPHATE 10 MG/ML IJ SOLN
10.0000 mg | Freq: Once | INTRAMUSCULAR | Status: AC
  Administered 2014-08-03: 10 mg via INTRAVENOUS

## 2014-08-03 MED ORDER — SODIUM CHLORIDE 0.9 % IV SOLN
500.0000 mg/m2 | Freq: Once | INTRAVENOUS | Status: AC
  Administered 2014-08-03: 875 mg via INTRAVENOUS
  Filled 2014-08-03: qty 35

## 2014-08-03 MED ORDER — DEXAMETHASONE SODIUM PHOSPHATE 10 MG/ML IJ SOLN
INTRAMUSCULAR | Status: AC
  Filled 2014-08-03: qty 1

## 2014-08-03 MED ORDER — ONDANSETRON 8 MG/NS 50 ML IVPB
INTRAVENOUS | Status: AC
  Filled 2014-08-03: qty 8

## 2014-08-03 MED ORDER — SODIUM CHLORIDE 0.9 % IV SOLN
Freq: Once | INTRAVENOUS | Status: AC
  Administered 2014-08-03: 11:00:00 via INTRAVENOUS

## 2014-08-03 NOTE — Progress Notes (Signed)
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   PRIOR THERAPY:  1. Status post left upper lobectomy with mediastinal lymph node dissection under the care of Dr. Roxan Hockey on December 10, 2007.  2. Status post 4 cycles of adjuvant chemotherapy with cisplatin and Taxotere. Last dose was given 03/22/2008.  3. Status post 6 cycles of systemic chemotherapy with carboplatin and Alimta for metastatic disease in the bone. The last dose was given July 02, 2010, with stable disease.  4. Status post 12 cycles of maintenance Alimta at 500 mg/sq m given every 3 weeks. The last dose was given 05/08/2011. 5. Stereotactic radiotherapy to metastatic brain lesions under the care of Dr. Tammi Klippel completed on 07/30/2014.  CURRENT THERAPY:  1. Maintenance Alimta at 500 mg/sq m given every 4 weeks. The patient is status post 38 cycles.  2. Zometa 4 mg IV given every 2 months for bone metastasis.   CHEMOTHERAPY INTENT: Palliative  CURRENT # OF CHEMOTHERAPY CYCLES: 39 CURRENT ANTIEMETICS: none  CURRENT SMOKING STATUS: Never smoker   ORAL CHEMOTHERAPY AND CONSENT: n/a  CURRENT BISPHOSPHONATES USE: Yes, Zometa given every 2 months  PAIN MANAGEMENT: [employer]  NARCOTICS INDUCED CONSTIPATION: Occasional, uses MiraLax  LIVING WILL AND CODE STATUS: Has a living will   INTERVAL HISTORY: Tamara Ortiz 73 y.o. female returns to the clinic today for followup visit. The patient is feeling fine today with no specific complaints except intermittent low back pain. She is still complaining of lack of appetite. She has no chest pain, shortness of breath, cough or hemoptysis. She denied having any  significant nausea or vomiting. She has no fever or chills. She has no significant night sweats. She is tolerating her treatment with Alimta fairly well with no significant adverse effects. She had repeat CT scan of the chest, abdomen and pelvis as well as MRI of the brain performed recently and she is here for evaluation and discussion of her scan results.   MEDICAL HISTORY: Past Medical History  Diagnosis Date  . Hypertension   . HYPERLIPIDEMIA 11/05/2007  . ANXIETY 05/13/2009  . DEPRESSIVE DISORDER 05/06/2008  . HYPERTENSION 11/05/2007  . ALLERGIC RHINITIS 11/05/2007  . CONSTIPATION, CHRONIC 05/13/2009  . UTI 05/06/2008  . BACK PAIN 05/11/2008  . OSTEOPOROSIS 11/05/2007  . INSOMNIA-SLEEP DISORDER-UNSPEC 05/13/2009  . FATIGUE 05/13/2009  . Swelling, mass, or lump in chest 11/05/2007  . LUNG CANCER, HX OF 05/06/2008  . 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 Lung cancer   . Brain cancer     ALLERGIES:  is allergic to amoxicillin.  MEDICATIONS:  Current Outpatient Prescriptions  Medication Sig Dispense Refill  . Alum & Mag Hydroxide-Simeth (MAGIC MOUTHWASH) SOLN Take 5 mLs by mouth 4 (four) times daily as needed for mouth pain. Swish and spit 240 mL 0  . aspirin 81 MG EC tablet Take 81 mg by mouth daily.      . Calcium Carbonate-Vitamin D (CALCIUM-VITAMIN D) 500-200 MG-UNIT per tablet Take 1 tablet by mouth 2 (two) times daily.      . Flaxseed, Linseed, (FLAXSEED OIL) 1000 MG CAPS Take 1,000 mg by mouth daily.      [name]  folic acid (FOLVITE) 1 MG tablet Take 1 tablet (1 mg total) by mouth daily. 90 tablet 0  . HYDROcodone-acetaminophen (NORCO) 5-325 MG per tablet Take 1 tablet by mouth every 6 (six) hours as needed for moderate pain. 30 tablet 0  . LORazepam (ATIVAN) 0.5 MG tablet Take 1 tablet (0.5 mg total) by mouth every 8 (eight) hours as needed for anxiety. 40 tablet 1  . losartan-hydrochlorothiazide (HYZAAR) 50-12.5 MG per tablet Take 1 tablet by mouth daily. 90 tablet 3  .  Multiple Vitamin (ANTIOXIDANT A/C/E PO) Take by mouth daily.      . Omega-3 350 MG CAPS Take by mouth daily.      . ondansetron (ZOFRAN) 8 MG tablet Take 1 tablet (8 mg total) by mouth every 8 (eight) hours as needed for nausea or vomiting. 20 tablet 1  . polyethylene glycol powder (MIRALAX) powder Take 17 g by mouth daily.      . Probiotic Product (PROBIOTIC FORMULA) CAPS Take 1 capsule by mouth daily.      . simvastatin (ZOCOR) 40 MG tablet Take 1 tablet (40 mg total) by mouth daily. 90 tablet 3  . zolendronic acid (ZOMETA) 4 MG/5ML injection Inject 4 mg into the vein once.      [name] zolpidem (AMBIEN CR) 12.5 MG CR tablet TAKE 1 TABLET BY MOUTH EVERY NIGHT AT BEDTIME AS NEEDED FOR SLEEP 90 tablet 1   No current facility-administered medications for this visit.    SURGICAL HISTORY:  Past Surgical History  Procedure Laterality Date  . Tubal ligation    . S/p lul lung surgury  12/2007  . Lobectomy      REVIEW OF SYSTEMS:  Constitutional: negative Eyes: negative Ears, nose, mouth, throat, and face: negative Respiratory: negative Cardiovascular: negative Gastrointestinal: negative Genitourinary:negative Integument/breast: negative Hematologic/lymphatic: negative Musculoskeletal:positive for arthralgias and back pain Neurological: negative Behavioral/Psych: negative Endocrine: negative Allergic/Immunologic: negative   PHYSICAL EXAMINATION: General appearance: alert, cooperative and no distress Head: Normocephalic, without obvious abnormality, atraumatic Neck: no adenopathy Lymph nodes: Cervical, supraclavicular, and axillary nodes normal. Resp: clear to auscultation bilaterally Back: symmetric, no curvature. ROM normal. No CVA tenderness. Cardio: regular rate and rhythm, S1, S2 normal, no murmur, click, rub or gallop GI: soft, non-tender; bowel sounds normal; no masses,  no organomegaly Extremities: extremities normal, atraumatic, no cyanosis or edema Neurologic: Alert and  oriented X 3, normal strength and tone. Normal symmetric reflexes. Normal coordination and gait  ECOG PERFORMANCE STATUS: 1 - Symptomatic but completely ambulatory  Blood pressure 132/73, pulse 55, temperature 98.1 F (36.7 C), temperature source Oral, resp. rate 18, height 5' 5" (1.651 m), weight 131 lb 3.2 oz (59.512 kg), SpO2 99 %.  LABORATORY DATA: Lab Results  Component Value Date   WBC 4.2 08/03/2014   HGB 13.0 08/03/2014   HCT 39.3 08/03/2014   MCV 97.7 08/03/2014   PLT 264 08/03/2014      Chemistry      Component Value Date/Time   NA 140 08/03/2014 0935   NA 138 05/11/2014 1404   NA 140 04/17/2012 0855   K 4.2 08/03/2014 0935   K 3.7 05/11/2014 1404   K 4.1 04/17/2012 0855   CL 100 05/11/2014 1404   CL 100 03/04/2013 0901   CL 99 04/17/2012 0855   CO2 25 08/03/2014 0935   CO2 25 05/11/2014 1404   CO2 29 04/17/2012 0855   BUN 10.6 08/03/2014 0935   BUN 13 05/11/2014 1404   BUN 15 04/17/2012 0855  CREATININE 1.0 08/03/2014 0935   CREATININE 1.08 05/11/2014 1404   CREATININE 1.4* 04/17/2012 0855      Component Value Date/Time   CALCIUM 10.2 08/03/2014 0935   CALCIUM 9.5 05/11/2014 1404   CALCIUM 9.5 04/17/2012 0855   ALKPHOS 70 08/03/2014 0935   ALKPHOS 68 05/11/2014 1404   ALKPHOS 68 04/17/2012 0855   AST 19 08/03/2014 0935   AST 21 05/11/2014 1404   AST 29 04/17/2012 0855   ALT 19 08/03/2014 0935   ALT 15 05/11/2014 1404   ALT 31 04/17/2012 0855   BILITOT 0.38 08/03/2014 0935   BILITOT 0.3 05/11/2014 1404   BILITOT 0.60 04/17/2012 0855       RADIOGRAPHIC STUDIES:  ASSESSMENT AND PLAN: This is a very pleasant 73 years old white female with recurrent non-small cell lung cancer, adenocarcinoma. The patient is currently on maintenance Alimta monthly status post total of 38 cycles.  The patient is feeling fine and tolerating the treatment well. She recently underwent stereotactic radiotherapy to metastatic brain lesions. I recommended for the patient  to continue her current treatment with single agent Alimta as scheduled. She would proceed with cycle #39 today as scheduled. For the poor appetite I gave the patient the option of treatment with Medrol Dosepak for consideration of Megace but she would like to hold on these options for now. She would come back for followup visit in one month for reevaluation. The patient was advised to call immediately if she has any concerning symptoms in the interval.  The patient voices understanding of current disease status and treatment options and is in agreement with the current care plan.  All questions were answered. The patient knows to call the clinic with any problems, questions or concerns. We can certainly see the patient much sooner if necessary.  Disclaimer: This note was dictated with voice recognition software. Similar sounding words can inadvertently be transcribed and may not be corrected upon review.

## 2014-08-03 NOTE — Progress Notes (Signed)
Patient states that she feels she is eating better.  However, she still has a poor appetite.  She does have nausea and vomiting after chemotherapy.  Constipation continues to be a problem. Weight documented as 131.2 pounds November 2, down from 135.8 pounds July 13.  Patient will drink Carnation breakfast essentials occasionally, but not daily.  Nutrition diagnosis: Inadequate oral intake continues.  Intervention: Educated patient on suggestions for food items she could have on hand for frequent snacks and meals.  Encouraged patient to keep a list of foods on the refrigerator and keep her refrigerator stocked with these items. Stressed the importance of increasing Carnation breakfast essentials. Questions answered.  Teach back method used.  Monitoring, evaluation, goals: Patient will work to increase oral intake and supplements to minimize weight loss.  Next visit:Patient to contact me with further questions or concerns.  **Disclaimer: This note was dictated with voice recognition software. Similar sounding words can inadvertently be transcribed and this note may contain transcription errors which may not have been corrected upon publication of note.**

## 2014-08-03 NOTE — Patient Instructions (Signed)
[facility] Discharge Instructions for Patients Receiving Chemotherapy  Today you received the following chemotherapy agents Alimta.  To help prevent nausea and vomiting after your treatment, we encourage you to take your nausea medication as directed.    If you develop nausea and vomiting that is not controlled by your nausea medication, call the clinic.   BELOW ARE SYMPTOMS THAT SHOULD BE REPORTED IMMEDIATELY:  *FEVER GREATER THAN 100.5 F  *CHILLS WITH OR WITHOUT FEVER  NAUSEA AND VOMITING THAT IS NOT CONTROLLED WITH YOUR NAUSEA MEDICATION  *UNUSUAL SHORTNESS OF BREATH  *UNUSUAL BRUISING OR BLEEDING  TENDERNESS IN MOUTH AND THROAT WITH OR WITHOUT PRESENCE OF ULCERS  *URINARY PROBLEMS  *BOWEL PROBLEMS  UNUSUAL RASH Items with * indicate a potential emergency and should be followed up as soon as possible.  Feel free to call the clinic you have any questions or concerns. The clinic phone number is [phone].

## 2014-08-03 NOTE — Telephone Encounter (Signed)
gv and printed appt sched and avs for pt for Dec...sed added tx.

## 2014-08-12 ENCOUNTER — Encounter: Payer: Self-pay | Admitting: Internal Medicine

## 2014-08-12 ENCOUNTER — Other Ambulatory Visit (INDEPENDENT_AMBULATORY_CARE_PROVIDER_SITE_OTHER): Payer: Medicare Other

## 2014-08-12 ENCOUNTER — Ambulatory Visit (INDEPENDENT_AMBULATORY_CARE_PROVIDER_SITE_OTHER): Payer: Medicare Other | Admitting: Internal Medicine

## 2014-08-12 VITALS — BP 112/82 | HR 72 | Temp 98.8°F | Ht 65.0 in | Wt 131.4 lb

## 2014-08-12 DIAGNOSIS — F329 Major depressive disorder, single episode, unspecified: Secondary | ICD-10-CM

## 2014-08-12 DIAGNOSIS — E785 Hyperlipidemia, unspecified: Secondary | ICD-10-CM

## 2014-08-12 DIAGNOSIS — Z23 Encounter for immunization: Secondary | ICD-10-CM

## 2014-08-12 DIAGNOSIS — I10 Essential (primary) hypertension: Secondary | ICD-10-CM

## 2014-08-12 DIAGNOSIS — G4700 Insomnia, unspecified: Secondary | ICD-10-CM

## 2014-08-12 DIAGNOSIS — F32A Depression, unspecified: Secondary | ICD-10-CM

## 2014-08-12 LAB — CBC WITH DIFFERENTIAL/PLATELET
Basophils Absolute: 0 10*3/uL (ref 0.0–0.1)
Basophils Relative: 0.7 % (ref 0.0–3.0)
Eosinophils Absolute: 0 10*3/uL (ref 0.0–0.7)
Eosinophils Relative: 0.7 % (ref 0.0–5.0)
HEMATOCRIT: 36.2 % (ref 36.0–46.0)
HEMOGLOBIN: 12.4 g/dL (ref 12.0–15.0)
LYMPHS ABS: 2 10*3/uL (ref 0.7–4.0)
Lymphocytes Relative: 55 % — ABNORMAL HIGH (ref 12.0–46.0)
MCHC: 34.1 g/dL (ref 30.0–36.0)
MCV: 96 fl (ref 78.0–100.0)
MONOS PCT: 15.1 % — AB (ref 3.0–12.0)
Monocytes Absolute: 0.5 10*3/uL (ref 0.1–1.0)
NEUTROS ABS: 1 10*3/uL — AB (ref 1.4–7.7)
Neutrophils Relative %: 28.5 % — ABNORMAL LOW (ref 43.0–77.0)
Platelets: 182 10*3/uL (ref 150.0–400.0)
RBC: 3.77 Mil/uL — ABNORMAL LOW (ref 3.87–5.11)
RDW: 12.4 % (ref 11.5–15.5)
WBC: 3.6 10*3/uL — ABNORMAL LOW (ref 4.0–10.5)

## 2014-08-12 LAB — URINALYSIS, ROUTINE W REFLEX MICROSCOPIC
Bilirubin Urine: NEGATIVE
Hgb urine dipstick: NEGATIVE
Ketones, ur: NEGATIVE
Nitrite: NEGATIVE
SPECIFIC GRAVITY, URINE: 1.01 (ref 1.000–1.030)
TOTAL PROTEIN, URINE-UPE24: NEGATIVE
URINE GLUCOSE: NEGATIVE
Urobilinogen, UA: 0.2 (ref 0.0–1.0)
pH: 7 (ref 5.0–8.0)

## 2014-08-12 MED ORDER — ZOLPIDEM TARTRATE ER 12.5 MG PO TBCR: EXTENDED_RELEASE_TABLET | ORAL | Status: DC

## 2014-08-12 NOTE — Progress Notes (Signed)
Subjective:    Patient ID: Tamara Ortiz, female    DOB: 1941/07/29, 73 y.o.   MRN: [id number]  HPI  Here for yearly f/u;  Overall doing ok;  Pt denies CP, worsening SOB, DOE, wheezing, orthopnea, PND, worsening LE edema, palpitations, dizziness or syncope.  Pt denies neurological change such as new headache, facial or extremity weakness.  Pt denies polydipsia, polyuria, or low sugar symptoms. Pt states overall good compliance with treatment and medications, good tolerability, and has been trying to follow lower cholesterol diet.  Pt denies worsening depressive symptoms, suicidal ideation or panic. No fever, night sweats, or other constitutional symptoms, but may have lost a few lbs and up and down appetite. Intersted in lower BP med if possible. Has not taken any for 2 days- SBP still 112 today, as well as reduce statin if possible.     Did see ER approx 2 wks ago for IVF's/dehydration.   Pt states good ability with ADL's, has low fall risk, home safety reviewed and adequate, no other significant changes in hearing or vision Still with significant sleep issue of getting to sleep, wants to try get off the [place], possibly try something else, ativan .5 does not cause drowsniess.  Already has some constipation.   LOst some wt recently, had some cancer issues.  Wt Readings from Last 3 Encounters:  08/12/14 131 lb 6 oz (59.591 kg)  08/03/14 131 lb 3.2 oz (59.512 kg)  07/27/14 128 lb 12.8 oz (58.423 kg)   Past Medical History  Diagnosis Date  . Hypertension   . HYPERLIPIDEMIA 11/05/2007  . ANXIETY 05/13/2009  . DEPRESSIVE DISORDER 05/06/2008  . HYPERTENSION 11/05/2007  . ALLERGIC RHINITIS 11/05/2007  . CONSTIPATION, CHRONIC 05/13/2009  . UTI 05/06/2008  . BACK PAIN 05/11/2008  . OSTEOPOROSIS 11/05/2007  . INSOMNIA-SLEEP DISORDER-UNSPEC 05/13/2009  . FATIGUE 05/13/2009  . Swelling, mass, or lump in chest 11/05/2007  . LUNG CANCER, HX OF 05/06/2008  . 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 Lung  cancer   . Brain cancer    Past Surgical History  Procedure Laterality Date  . Tubal ligation    . S/p lul lung surgury  12/2007  . Lobectomy      reports that she has never smoked. She has never used smokeless tobacco. She reports that she drinks alcohol. She reports that she does not use illicit drugs. family history includes ALS in her father; Alcohol abuse in her son; Heart disease in her mother. There is no history of Cancer. Allergies  Allergen Reactions  . Amoxicillin     REACTION: hives/itch   Current Outpatient Prescriptions on File Prior to Visit  Medication Sig Dispense Refill  . Alum & Mag Hydroxide-Simeth (MAGIC MOUTHWASH) SOLN Take 5 mLs by mouth 4 (four) times daily as needed for mouth pain. Swish and spit 240 mL 0  . aspirin 81 MG EC tablet Take 81 mg by mouth daily.      . Calcium Carbonate-Vitamin D (CALCIUM-VITAMIN D) 500-200 MG-UNIT per tablet Take 1 tablet by mouth 2 (two) times daily.      . Flaxseed, Linseed, (FLAXSEED OIL) 1000 MG CAPS Take 1,000 mg by mouth daily.      . folic acid (FOLVITE) 1 MG tablet Take 1 tablet (1 mg total) by mouth daily. 90 tablet 0  . HYDROcodone-acetaminophen (NORCO) 5-325 MG per tablet Take 1 tablet by mouth every 6 (six) hours as needed for moderate pain.  30 tablet 0  . LORazepam (ATIVAN) 0.5 MG tablet Take 1 tablet (0.5 mg total) by mouth every 8 (eight) hours as needed for anxiety. 40 tablet 1  . Multiple Vitamin (ANTIOXIDANT A/C/E PO) Take by mouth daily.      . Omega-3 350 MG CAPS Take by mouth daily.      . ondansetron (ZOFRAN) 8 MG tablet Take 1 tablet (8 mg total) by mouth every 8 (eight) hours as needed for nausea or vomiting. 20 tablet 1  . polyethylene glycol powder (MIRALAX) powder Take 17 g by mouth daily.      . Probiotic Product (PROBIOTIC FORMULA) CAPS Take 1 capsule by mouth daily.      . simvastatin (ZOCOR) 40 MG tablet Take 1 tablet (40 mg total) by mouth daily. 90 tablet 3  . zolendronic acid (ZOMETA) 4 MG/5ML  injection Inject 4 mg into the vein once.      [name] zolpidem (AMBIEN CR) 12.5 MG CR tablet TAKE 1 TABLET BY MOUTH EVERY NIGHT AT BEDTIME AS NEEDED FOR SLEEP 90 tablet 1   No current facility-administered medications on file prior to visit.   Review of Systems Constitutional: Negative for increased diaphoresis, other activity, appetite or other siginficant weight change  HENT: Negative for worsening hearing loss, ear pain, facial swelling, mouth sores and neck stiffness.   Eyes: Negative for other worsening pain, redness or visual disturbance.  Respiratory: Negative for shortness of breath and wheezing.   Cardiovascular: Negative for chest pain and palpitations.  Gastrointestinal: Negative for diarrhea, blood in stool, abdominal distention or other pain Genitourinary: Negative for hematuria, flank pain or change in urine volume.  Musculoskeletal: Negative for myalgias or other joint complaints.  Skin: Negative for color change and wound.  Neurological: Negative for syncope and numbness. other than noted Hematological: Negative for adenopathy. or other swelling Psychiatric/Behavioral: Negative for hallucinations, self-injury, decreased concentration or other worsening agitation.      Objective:   Physical Exam BP 112/82 mmHg  Pulse 72  Temp(Src) 98.8 F (37.1 C) (Oral)  Ht 5\' 5"  (1.651 m)  Wt 131 lb 6 oz (59.591 kg)  BMI 21.86 kg/m2  SpO2 95% VS noted,  Constitutional: Pt is oriented to person, place, and time. Appears well-developed and well-nourished.  Head: Normocephalic and atraumatic.  Right Ear: External ear normal.  Left Ear: External ear normal.  Nose: Nose normal.  Mouth/Throat: Oropharynx is clear and moist.  Eyes: Conjunctivae and EOM are normal. Pupils are equal, round, and reactive to light.  Neck: Normal range of motion. Neck supple. No JVD present. No tracheal deviation present.  Cardiovascular: Normal rate, regular rhythm, normal heart sounds and intact distal  pulses.   Pulmonary/Chest: Effort normal and breath sounds without rales or wheezing  Abdominal: Soft. Bowel sounds are normal. NT. No HSM  Musculoskeletal: Normal range of motion. Exhibits no edema.  Lymphadenopathy:  Has no cervical adenopathy.  Neurological: Pt is alert and oriented to person, place, and time. Pt has normal reflexes. No cranial nerve deficit. Motor grossly intact Skin: Skin is warm and dry. No rash noted.  Psychiatric:  Has mild nervousl mood and affect. Behavior is normal. , not depressed affect    Assessment & Plan:

## 2014-08-12 NOTE — Patient Instructions (Addendum)
You had the new Prevnar pneumonia shot today  We will need the Td (tetanus) for next time per your request  OK to stop the losartan-HCT  Please continue all other medications as before, and refills have been done if requested.  Please have the pharmacy call with any other refills you may need.  Please continue your efforts at being more active, low cholesterol diet, and weight control.  You are otherwise up to date with prevention measures today.  Please keep your appointments with your specialists as you may have planned  Please go to the LAB in the Basement (turn left off the elevator) for the tests to be done today  You will be contacted by phone if any changes need to be made immediately.  Otherwise, you will receive a letter about your results with an explanation, but please check with MyChart first.  Please remember to sign up for MyChart if you have not done so, as this will be important to you in the future with finding out test results, communicating by private email, and scheduling acute appointments online when needed.  Please return in 6 months, or sooner if needed

## 2014-08-12 NOTE — Progress Notes (Signed)
Pre visit review using our clinic review tool, if applicable. No additional management support is needed unless otherwise documented below in the visit note. 

## 2014-08-13 LAB — BASIC METABOLIC PANEL
BUN: 16 mg/dL (ref 6–23)
CALCIUM: 10.6 mg/dL — AB (ref 8.4–10.5)
CO2: 35 meq/L — AB (ref 19–32)
CREATININE: 1.1 mg/dL (ref 0.4–1.2)
Chloride: 99 mEq/L (ref 96–112)
GFR: 54.33 mL/min — AB (ref >60.00)
Glucose, Bld: 105 mg/dL — ABNORMAL HIGH (ref 70–99)
Potassium: 3.7 mEq/L (ref 3.5–5.1)
SODIUM: 137 meq/L (ref 135–145)

## 2014-08-13 LAB — LIPID PANEL
CHOL/HDL RATIO: 3
Cholesterol: 218 mg/dL — ABNORMAL HIGH (ref 0–200)
HDL: 69.8 mg/dL (ref >39.00)
LDL CALC: 127 mg/dL — AB (ref 0–99)
NONHDL: 148.2
Triglycerides: 104 mg/dL (ref 0.0–149.0)
VLDL: 20.8 mg/dL (ref 0.0–40.0)

## 2014-08-13 LAB — HEPATIC FUNCTION PANEL
ALBUMIN: 3.4 g/dL — AB (ref 3.5–5.2)
ALK PHOS: 79 U/L (ref 39–117)
ALT: 24 U/L (ref 0–35)
AST: 28 U/L (ref 0–37)
BILIRUBIN DIRECT: 0.1 mg/dL (ref 0.0–0.3)
Total Bilirubin: 0.4 mg/dL (ref 0.2–1.2)
Total Protein: 6.8 g/dL (ref 6.0–8.3)

## 2014-08-13 LAB — TSH: TSH: 0.7 u[IU]/mL (ref 0.35–4.50)

## 2014-08-15 NOTE — Assessment & Plan Note (Addendum)
stable overall by history and exam, recent data reviewed with pt, and pt to d/c losartan-HCT as low today, likely to be lower with any further wt loss,  to f/u any worsening symptoms or concerns BP Readings from Last 3 Encounters:  08/12/14 112/82  08/03/14 132/73  07/30/14 128/78

## 2014-08-15 NOTE — Assessment & Plan Note (Signed)
stable overall by history and exam, recent data reviewed with pt, and pt to continue medical treatment as before,  to f/u any worsening symptoms or concerns Lab Results  Component Value Date   LDLCALC 127* 08/12/2014

## 2014-08-15 NOTE — Assessment & Plan Note (Signed)
Stable, ok for refill ambien,  to f/u any worsening symptoms or concerns

## 2014-08-15 NOTE — Assessment & Plan Note (Signed)
stable overall by history and exam, recent data reviewed with pt, and pt to continue medical treatment as before,  to f/u any worsening symptoms or concerns Lab Results  Component Value Date   WBC 3.6* 08/12/2014   HGB 12.4 08/12/2014   HCT 36.2 08/12/2014   PLT 182.0 08/12/2014   GLUCOSE 105* 08/12/2014   CHOL 218* 08/12/2014   TRIG 104.0 08/12/2014   HDL 69.80 08/12/2014   LDLDIRECT 137.9 06/25/2013   LDLCALC 127* 08/12/2014   ALT 24 08/12/2014   AST 28 08/12/2014   NA 137 08/12/2014   K 3.7 08/12/2014   CL 99 08/12/2014   CREATININE 1.1 08/12/2014   BUN 16 08/12/2014   CO2 35* 08/12/2014   TSH 0.70 08/12/2014   INR 1.00 01/24/2010   HGBA1C  10/30/2007    5.4 (NOTE)   The ADA recommends the following therapeutic goals for glycemic   control related to Hgb A1C measurement:   Goal of Therapy:   < 7.0% Hgb A1C   Action Suggested:  > 8.0% Hgb A1C   Ref:  Diabetes Care, 22, Suppl. 1, 1999

## 2014-08-21 ENCOUNTER — Encounter: Payer: Self-pay | Admitting: Internal Medicine

## 2014-08-21 MED ORDER — LEVOFLOXACIN 250 MG PO TABS: 250.0000 mg | ORAL_TABLET | Freq: Every day | ORAL | Status: DC

## 2014-08-21 NOTE — Telephone Encounter (Signed)
[place] or antibix - done erx

## 2014-08-25 ENCOUNTER — Encounter: Payer: Self-pay | Admitting: Internal Medicine

## 2014-08-25 NOTE — Telephone Encounter (Signed)
Unfortunately this can be a side effect of the levaquin and is actually somewhat common  Since you have taken for 5 days now, you may be ok for now to stop all antibiotic completely, and follow for any worsening fever, pain, or colored d/c.  Robin to inform pt

## 2014-08-26 NOTE — Telephone Encounter (Signed)
Received response from PCP.  Levaquin added to allergy list due to intolerance.

## 2014-09-01 ENCOUNTER — Ambulatory Visit (HOSPITAL_BASED_OUTPATIENT_CLINIC_OR_DEPARTMENT_OTHER): Payer: Medicare Other

## 2014-09-01 ENCOUNTER — Other Ambulatory Visit (HOSPITAL_BASED_OUTPATIENT_CLINIC_OR_DEPARTMENT_OTHER): Payer: Medicare Other

## 2014-09-01 ENCOUNTER — Ambulatory Visit (HOSPITAL_BASED_OUTPATIENT_CLINIC_OR_DEPARTMENT_OTHER): Payer: Medicare Other | Admitting: Physician Assistant

## 2014-09-01 ENCOUNTER — Telehealth: Payer: Self-pay | Admitting: Internal Medicine

## 2014-09-01 ENCOUNTER — Encounter: Payer: Self-pay | Admitting: Physician Assistant

## 2014-09-01 VITALS — BP 137/80 | HR 61 | Temp 98.3°F | Resp 18 | Ht 65.0 in | Wt 127.0 lb

## 2014-09-01 DIAGNOSIS — C7951 Secondary malignant neoplasm of bone: Secondary | ICD-10-CM

## 2014-09-01 DIAGNOSIS — Z5111 Encounter for antineoplastic chemotherapy: Secondary | ICD-10-CM

## 2014-09-01 DIAGNOSIS — C3412 Malignant neoplasm of upper lobe, left bronchus or lung: Secondary | ICD-10-CM

## 2014-09-01 DIAGNOSIS — C7931 Secondary malignant neoplasm of brain: Secondary | ICD-10-CM

## 2014-09-01 DIAGNOSIS — C3490 Malignant neoplasm of unspecified part of unspecified bronchus or lung: Secondary | ICD-10-CM

## 2014-09-01 DIAGNOSIS — C3492 Malignant neoplasm of unspecified part of left bronchus or lung: Secondary | ICD-10-CM

## 2014-09-01 DIAGNOSIS — C801 Malignant (primary) neoplasm, unspecified: Secondary | ICD-10-CM

## 2014-09-01 LAB — COMPREHENSIVE METABOLIC PANEL (CC13)
ALT: 15 U/L (ref 0–55)
AST: 24 U/L (ref 5–34)
Albumin: 4 g/dL (ref 3.5–5.0)
Alkaline Phosphatase: 88 U/L (ref 40–150)
Anion Gap: 11 mEq/L (ref 3–11)
BUN: 11.8 mg/dL (ref 7.0–26.0)
CO2: 27 meq/L (ref 22–29)
Calcium: 10.5 mg/dL — ABNORMAL HIGH (ref 8.4–10.4)
Chloride: 100 mEq/L (ref 98–109)
Creatinine: 0.9 mg/dL (ref 0.6–1.1)
Glucose: 92 mg/dl (ref 70–140)
Potassium: 3.6 mEq/L (ref 3.5–5.1)
Sodium: 138 mEq/L (ref 136–145)
Total Bilirubin: 0.49 mg/dL (ref 0.20–1.20)
Total Protein: 7.2 g/dL (ref 6.4–8.3)

## 2014-09-01 LAB — CBC WITH DIFFERENTIAL/PLATELET
BASO%: 0.6 % (ref 0.0–2.0)
Basophils Absolute: 0 10*3/uL (ref 0.0–0.1)
EOS%: 0.4 % (ref 0.0–7.0)
Eosinophils Absolute: 0 10*3/uL (ref 0.0–0.5)
HCT: 39.3 % (ref 34.8–46.6)
HGB: 13.1 g/dL (ref 11.6–15.9)
LYMPH#: 1.4 10*3/uL (ref 0.9–3.3)
LYMPH%: 27.6 % (ref 14.0–49.7)
MCH: 32.3 pg (ref 25.1–34.0)
MCHC: 33.3 g/dL (ref 31.5–36.0)
MCV: 97 fL (ref 79.5–101.0)
MONO#: 0.4 10*3/uL (ref 0.1–0.9)
MONO%: 7.8 % (ref 0.0–14.0)
NEUT#: 3.1 10*3/uL (ref 1.5–6.5)
NEUT%: 63.6 % (ref 38.4–76.8)
Platelets: 243 10*3/uL (ref 145–400)
RBC: 4.05 10*6/uL (ref 3.70–5.45)
RDW: 12.9 % (ref 11.2–14.5)
WBC: 4.9 10*3/uL (ref 3.9–10.3)

## 2014-09-01 MED ORDER — SODIUM CHLORIDE 0.9 % IV SOLN: Freq: Once | INTRAVENOUS | Status: DC

## 2014-09-01 MED ORDER — ZOLEDRONIC ACID 4 MG/100ML IV SOLN
4.0000 mg | Freq: Once | INTRAVENOUS | Status: AC
  Administered 2014-09-01: 4 mg via INTRAVENOUS
  Filled 2014-09-01: qty 100

## 2014-09-01 MED ORDER — HEPARIN SOD (PORK) LOCK FLUSH 100 UNIT/ML IV SOLN
500.0000 [IU] | Freq: Once | INTRAVENOUS | Status: DC | PRN
  Filled 2014-09-01: qty 5

## 2014-09-01 MED ORDER — DEXAMETHASONE SODIUM PHOSPHATE 10 MG/ML IJ SOLN
INTRAMUSCULAR | Status: AC
  Filled 2014-09-01: qty 1

## 2014-09-01 MED ORDER — ONDANSETRON 8 MG/NS 50 ML IVPB
INTRAVENOUS | Status: AC
  Filled 2014-09-01: qty 8

## 2014-09-01 MED ORDER — SODIUM CHLORIDE 0.9 % IV SOLN
Freq: Once | INTRAVENOUS | Status: AC
  Administered 2014-09-01: 13:00:00 via INTRAVENOUS

## 2014-09-01 MED ORDER — SODIUM CHLORIDE 0.9 % IV SOLN
500.0000 mg/m2 | Freq: Once | INTRAVENOUS | Status: AC
  Administered 2014-09-01: 875 mg via INTRAVENOUS
  Filled 2014-09-01: qty 35

## 2014-09-01 MED ORDER — ONDANSETRON 8 MG/50ML IVPB (CHCC)
8.0000 mg | Freq: Once | INTRAVENOUS | Status: AC
  Administered 2014-09-01: 8 mg via INTRAVENOUS

## 2014-09-01 MED ORDER — DEXAMETHASONE SODIUM PHOSPHATE 10 MG/ML IJ SOLN
10.0000 mg | Freq: Once | INTRAMUSCULAR | Status: AC
  Administered 2014-09-01: 10 mg via INTRAVENOUS

## 2014-09-01 NOTE — Telephone Encounter (Signed)
gv adn printed appt sched and avs for pt for Jan 2016....gv pt barium

## 2014-09-01 NOTE — Progress Notes (Addendum)
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   PRIOR THERAPY:  1. Status post left upper lobectomy with mediastinal lymph node dissection under the care of Dr. Roxan Hockey on December 10, 2007.  2. Status post 4 cycles of adjuvant chemotherapy with cisplatin and Taxotere. Last dose was given 03/22/2008.  3. Status post 6 cycles of systemic chemotherapy with carboplatin and Alimta for metastatic disease in the bone. The last dose was given July 02, 2010, with stable disease.  4. Status post 12 cycles of maintenance Alimta at 500 mg/sq m given every 3 weeks. The last dose was given 05/08/2011. 5. Stereotactic radiotherapy to metastatic brain lesions under the care of Dr. Tammi Klippel completed on 07/30/2014.  CURRENT THERAPY:  1. Maintenance Alimta at 500 mg/sq m given every 4 weeks. The patient is status post 39 cycles.  2. Zometa 4 mg IV given every 2 months for bone metastasis.   CHEMOTHERAPY INTENT: Palliative  CURRENT # OF CHEMOTHERAPY CYCLES: 40 CURRENT ANTIEMETICS: none  CURRENT SMOKING STATUS: Never smoker   ORAL CHEMOTHERAPY AND CONSENT: n/a  CURRENT BISPHOSPHONATES USE: Yes, Zometa given every 2 months  PAIN MANAGEMENT: Norco  NARCOTICS INDUCED CONSTIPATION: Occasional, uses MiraLax  LIVING WILL AND CODE STATUS: Has a living will   INTERVAL HISTORY: Tamara Ortiz 73 y.o. female returns to the clinic today for followup visit. The patient is feeling fine today with no specific complaints except for decreased appetite. She reports she is to see Dr. Tammi Klippel for a follow up appointment after her [place] treatments in October 2015. She reports an episode of  severe nausea, vomiting and diarrhea after her last CT scan with oral contrast in late September 2015. These symptoms could have been related to the brain metastasis she had at the time. She has no chest pain, shortness of breath, cough or hemoptysis. She denied having any significant nausea or vomiting. She has no fever or chills. She has no significant night sweats. She is having some constipation but it is well managed with Miralax.She is tolerating her treatment with Alimta fairly well with no significant adverse effects.   MEDICAL HISTORY: Past Medical History  Diagnosis Date  . Hypertension   . HYPERLIPIDEMIA 11/05/2007  . ANXIETY 05/13/2009  . DEPRESSIVE DISORDER 05/06/2008  . HYPERTENSION 11/05/2007  . ALLERGIC RHINITIS 11/05/2007  . CONSTIPATION, CHRONIC 05/13/2009  . UTI 05/06/2008  . BACK PAIN 05/11/2008  . OSTEOPOROSIS 11/05/2007  . INSOMNIA-SLEEP DISORDER-UNSPEC 05/13/2009  . FATIGUE 05/13/2009  . Swelling, mass, or lump in chest 11/05/2007  . LUNG CANCER, HX OF 05/06/2008  . 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 Lung cancer   . Brain cancer     ALLERGIES:  is allergic to amoxicillin and levaquin.  MEDICATIONS:  Current Outpatient Prescriptions  Medication Sig Dispense Refill  . Alum & Mag Hydroxide-Simeth (MAGIC MOUTHWASH) SOLN Take 5 mLs by mouth 4 (four) times daily as needed for mouth pain. Swish and spit 240 mL 0  . aspirin 81 MG EC tablet Take 81 mg by mouth daily.      . Calcium Carbonate-Vitamin D (CALCIUM-VITAMIN D) 500-200 MG-UNIT per  tablet Take 1 tablet by mouth 2 (two) times daily.      . Flaxseed, Linseed, (FLAXSEED OIL) 1000 MG CAPS Take 1,000 mg by mouth daily.      . folic acid (FOLVITE) 1 MG tablet Take 1 tablet (1 mg total) by mouth daily. 90 tablet 0  . HYDROcodone-acetaminophen (NORCO) 5-325 MG per tablet Take 1 tablet by mouth every 6 (six) hours as needed for moderate pain. 30 tablet 0  . LORazepam (ATIVAN) 0.5 MG tablet Take 1 tablet (0.5 mg  total) by mouth every 8 (eight) hours as needed for anxiety. 40 tablet 1  . losartan-hydrochlorothiazide (HYZAAR) 50-12.5 MG per tablet   3  . Multiple Vitamin (ANTIOXIDANT A/C/E PO) Take by mouth daily.      . Omega-3 350 MG CAPS Take by mouth daily.      . ondansetron (ZOFRAN) 8 MG tablet Take 1 tablet (8 mg total) by mouth every 8 (eight) hours as needed for nausea or vomiting. 20 tablet 1  . polyethylene glycol powder (MIRALAX) powder Take 17 g by mouth daily.      . Probiotic Product (PROBIOTIC FORMULA) CAPS Take 1 capsule by mouth daily.      . simvastatin (ZOCOR) 40 MG tablet Take 1 tablet (40 mg total) by mouth daily. 90 tablet 3  . zolendronic acid (ZOMETA) 4 MG/5ML injection Inject 4 mg into the vein once.      [name] zolpidem (AMBIEN CR) 12.5 MG CR tablet TAKE 1 TABLET BY MOUTH EVERY NIGHT AT BEDTIME AS NEEDED FOR SLEEP 90 tablet 1   No current facility-administered medications for this visit.   Facility-Administered Medications Ordered in Other Visits  Medication Dose Route Frequency Provider Last Rate Last Dose  . 0.9 %  sodium chloride infusion   Intravenous Once Curt Bears, MD      . heparin lock flush 100 unit/mL  500 Units Intracatheter Once PRN Curt Bears, MD        SURGICAL HISTORY:  Past Surgical History  Procedure Laterality Date  . Tubal ligation    . S/p lul lung surgury  12/2007  . Lobectomy      REVIEW OF SYSTEMS:  Constitutional: positive for anorexia and weight loss Eyes: negative Ears, nose, mouth, throat, and face: negative Respiratory: negative Cardiovascular: negative Gastrointestinal: negative Genitourinary:negative Integument/breast: negative Hematologic/lymphatic: negative Musculoskeletal:positive for arthralgias and back pain Neurological: negative Behavioral/Psych: negative Endocrine: negative Allergic/Immunologic: negative   PHYSICAL EXAMINATION: General appearance: alert, cooperative and no distress Head: Normocephalic, without  obvious abnormality, atraumatic Neck: no adenopathy Lymph nodes: Cervical, supraclavicular, and axillary nodes normal. Resp: clear to auscultation bilaterally Back: symmetric, no curvature. ROM normal. No CVA tenderness. Cardio: regular rate and rhythm, S1, S2 normal, no murmur, click, rub or gallop GI: soft, non-tender; bowel sounds normal; no masses,  no organomegaly Extremities: extremities normal, atraumatic, no cyanosis or edema Neurologic: Alert and oriented X 3, normal strength and tone. Normal symmetric reflexes. Normal coordination and gait  ECOG PERFORMANCE STATUS: 1 - Symptomatic but completely ambulatory  Blood pressure 137/80, pulse 61, temperature 98.3 F (36.8 C), resp. rate 18, height '5\' 5"'  (1.651 m), weight 127 lb (57.607 kg).  LABORATORY DATA: Lab Results  Component Value Date   WBC 4.9 09/01/2014   HGB 13.1 09/01/2014   HCT 39.3 09/01/2014   MCV 97.0 09/01/2014   PLT 243 09/01/2014      Chemistry      Component Value Date/Time   NA 138 09/01/2014 1109   NA  137 08/12/2014 1716   NA 140 04/17/2012 0855   K 3.6 09/01/2014 1109   K 3.7 08/12/2014 1716   K 4.1 04/17/2012 0855   CL 99 08/12/2014 1716   CL 100 03/04/2013 0901   CL 99 04/17/2012 0855   CO2 27 09/01/2014 1109   CO2 35* 08/12/2014 1716   CO2 29 04/17/2012 0855   BUN 11.8 09/01/2014 1109   BUN 16 08/12/2014 1716   BUN 15 04/17/2012 0855   CREATININE 0.9 09/01/2014 1109   CREATININE 1.1 08/12/2014 1716   CREATININE 1.4* 04/17/2012 0855      Component Value Date/Time   CALCIUM 10.5* 09/01/2014 1109   CALCIUM 10.6* 08/12/2014 1716   CALCIUM 9.5 04/17/2012 0855   ALKPHOS 88 09/01/2014 1109   ALKPHOS 79 08/12/2014 1716   ALKPHOS 68 04/17/2012 0855   AST 24 09/01/2014 1109   AST 28 08/12/2014 1716   AST 29 04/17/2012 0855   ALT 15 09/01/2014 1109   ALT 24 08/12/2014 1716   ALT 31 04/17/2012 0855   BILITOT 0.49 09/01/2014 1109   BILITOT 0.4 08/12/2014 1716   BILITOT 0.60 04/17/2012 0855         RADIOGRAPHIC STUDIES:  ASSESSMENT AND PLAN: This is a very pleasant 73 years old white female with recurrent non-small cell lung cancer, adenocarcinoma. The patient is currently on maintenance Alimta monthly status post total of 39 cycles.  The patient is feeling fine and tolerating the treatment well. She recently underwent stereotactic radiotherapy to metastatic brain lesions. The patient was discussed with and also seen by Dr. Julien Nordmann. She would like to get her restaging CT scan after Christmas, she is planning to travel. She would like to postpone her next cycle of chemotherapy until after the first Monday in January 2016. She will proceed with cycle #40 today as scheduled. She will be scheduled for a restaging CT scan of th chest, abdomen and pelvis with contrast to re-evaluate her disease on 09/30/2014. She will follow up on 10/05/2014 for cycle #41 and another symptom management visit. The patient was advised to call immediately if she has any concerning symptoms in the interval.  The patient voices understanding of current disease status and treatment options and is in agreement with the current care plan.  All questions were answered. The patient knows to call the clinic with any problems, questions or concerns. We can certainly see the patient much sooner if necessary.  Carlton Adam, PA-C 09/01/2014  ADDENDUM: Hematology/Oncology Attending: I had a face to face encounter with the patient today. I recommended her care plan. This is a very pleasant 73 years old white female with metastatic non-small cell lung cancer, adenocarcinoma. She is currently on maintenance chemotherapy with single agent Alimta status post 39 cycles. She is tolerating her treatment fairly well with no significant adverse effects. I recommended for her to proceed with cycle #40 today as scheduled. She was supposed to start cycle #41 and of December 2015 but the patient would like an extra week off to  travel to the beach for vacation with the family. She would have repeat CT scan of the chest, abdomen pelvis the end of December and the patient will come back for follow-up visit with the start of the next cycle in early January 2016. She was advised to call immediately if she has any concerning symptoms in the interval.  Disclaimer: This note was dictated with voice recognition software. Similar sounding words can inadvertently be transcribed and may be  missed upon review. Eilleen Kempf., MD 09/01/2014

## 2014-09-01 NOTE — Patient Instructions (Signed)
[facility] Discharge Instructions for Patients Receiving Chemotherapy  Today you received the following chemotherapy agents Alimta, Zometa  To help prevent nausea and vomiting after your treatment, we encourage you to take your nausea medication    If you develop nausea and vomiting that is not controlled by your nausea medication, call the clinic.   BELOW ARE SYMPTOMS THAT SHOULD BE REPORTED IMMEDIATELY:  *FEVER GREATER THAN 100.5 F  *CHILLS WITH OR WITHOUT FEVER  NAUSEA AND VOMITING THAT IS NOT CONTROLLED WITH YOUR NAUSEA MEDICATION  *UNUSUAL SHORTNESS OF BREATH  *UNUSUAL BRUISING OR BLEEDING  TENDERNESS IN MOUTH AND THROAT WITH OR WITHOUT PRESENCE OF ULCERS  *URINARY PROBLEMS  *BOWEL PROBLEMS  UNUSUAL RASH Items with * indicate a potential emergency and should be followed up as soon as possible.  Feel free to call the clinic you have any questions or concerns. The clinic phone number is [phone].

## 2014-09-01 NOTE — Patient Instructions (Signed)
Keep your appointments for your restaging Ct scans and next follow up appointment and next cycle of chemotherapy as scheduled

## 2014-09-03 ENCOUNTER — Telehealth: Payer: Self-pay | Admitting: *Deleted

## 2014-09-03 NOTE — Telephone Encounter (Signed)
-----   Message from Carlton Adam, PA-C sent at 09/03/2014  9:26 AM EST ----- Abnormal results, please call and notify patient and ask her to push po fluids- her calcium level is slightly high

## 2014-09-03 NOTE — Telephone Encounter (Signed)
Called and informed patient to push po fluids- calcium level is slightly high.  Per Awilda Metro, PA.  Patient verbalized understanding.

## 2014-09-06 NOTE — Progress Notes (Signed)
Radiation Oncology         [phone] ________________________________  Name: Tamara Ortiz MRN: [id number]  Date: 09/07/2014  DOB: 1941-02-08  Follow-Up Visit Note  CC: [name], MD  Biagio Borg, MD  Diagnosis:   73 yo woman with 4 subcentimeter brain metastases from non-small cell carcinoma of the left upper lung - Stage IV s/p SRS   1.  On 07/30/2014 she was treated to 4 targets to 20 Gy:   Right frontal 5.7 mm   Right frontal 5.4 mm    Right frontal 5 mm    Left frontal 2 mm     ICD-9-CM ICD-10-CM   1. Brain metastasis 198.3 C79.31     Interval Since Last Radiation:  4  weeks  Narrative:  The patient returns today for routine follow-up.  Two pound weight loss noted since 09/01/2014. Vitals stable. Denies pain at this time. Reports occasionally she has middle to low back pain that resolves with norco. Reports nausea associated with the effects of chemotherapy and decreased appetite. Reports occasional floaters. Denies diplopia, ringing in the ears, headache or dizziness. Denies cough or shortness of breath                              ALLERGIES:  is allergic to amoxicillin and levaquin.  Meds: Current Outpatient Prescriptions  Medication Sig Dispense Refill  . Alum & Mag Hydroxide-Simeth (MAGIC MOUTHWASH) SOLN Take 5 mLs by mouth 4 (four) times daily as needed for mouth pain. Swish and spit 240 mL 0  . aspirin 81 MG EC tablet Take 81 mg by mouth daily.      . Calcium Carbonate-Vitamin D (CALCIUM-VITAMIN D) 500-200 MG-UNIT per tablet Take 1 tablet by mouth 2 (two) times daily.      . Flaxseed, Linseed, (FLAXSEED OIL) 1000 MG CAPS Take 1,000 mg by mouth daily.      . folic acid (FOLVITE) 1 MG tablet Take 1 tablet (1 mg total) by mouth daily. 90 tablet 0  . HYDROcodone-acetaminophen (NORCO) 5-325 MG per tablet Take 1 tablet by mouth every 6 (six) hours as needed for moderate pain. 30 tablet 0  . LORazepam (ATIVAN) 0.5 MG tablet Take 1 tablet (0.5 mg total) by mouth every 8  (eight) hours as needed for anxiety. 40 tablet 1  . losartan-hydrochlorothiazide (HYZAAR) 50-12.5 MG per tablet   3  . Multiple Vitamin (ANTIOXIDANT A/C/E PO) Take by mouth daily.      . Omega-3 350 MG CAPS Take by mouth daily.      . ondansetron (ZOFRAN) 8 MG tablet Take 1 tablet (8 mg total) by mouth every 8 (eight) hours as needed for nausea or vomiting. 20 tablet 1  . polyethylene glycol powder (MIRALAX) powder Take 17 g by mouth daily.      . Probiotic Product (PROBIOTIC FORMULA) CAPS Take 1 capsule by mouth daily.      . simvastatin (ZOCOR) 40 MG tablet Take 1 tablet (40 mg total) by mouth daily. 90 tablet 3  . zolendronic acid (ZOMETA) 4 MG/5ML injection Inject 4 mg into the vein once.      [name] zolpidem (AMBIEN CR) 12.5 MG CR tablet TAKE 1 TABLET BY MOUTH EVERY NIGHT AT BEDTIME AS NEEDED FOR SLEEP 90 tablet 1   No current facility-administered medications for this encounter.    Physical Findings: The patient is in no acute distress. Patient is alert and oriented.  weight is  125 lb 11.2 oz (57.017 kg). Her blood pressure is 108/64 and her pulse is 71. Her respiration is 16. .  No significant changes.  Lab Findings: Lab Results  Component Value Date   WBC 4.9 09/01/2014   HGB 13.1 09/01/2014   HCT 39.3 09/01/2014   MCV 97.0 09/01/2014   PLT 243 09/01/2014    @LASTCHEM @  Radiographic Findings: No results found.  Impression:  The patient is recovering from the effects of radiation.    Plan:  Brain MRI 3 months after SRS then follow-up in brain clinic.  _____________________________________  Sheral Apley. Tammi Klippel, M.D.

## 2014-09-07 ENCOUNTER — Encounter: Payer: Self-pay | Admitting: Radiation Oncology

## 2014-09-07 ENCOUNTER — Ambulatory Visit
Admission: RE | Admit: 2014-09-07 | Discharge: 2014-09-07 | Disposition: A | Discharge status: Home / Self Care | Payer: Medicare Other | Source: Ambulatory Visit | Attending: Radiation Oncology | Admitting: Radiation Oncology

## 2014-09-07 VITALS — BP 108/64 | HR 71 | Resp 16 | Wt 125.7 lb

## 2014-09-07 DIAGNOSIS — C7931 Secondary malignant neoplasm of brain: Secondary | ICD-10-CM

## 2014-09-07 NOTE — Progress Notes (Signed)
Two pound weight loss noted since 09/01/2014. Vitals stable. Denies pain at this time. Reports occasionally she has middle to low back pain that resolves with norco. Reports nausea associated with the effects of chemotherapy and decreased appetite. Reports occasional floaters. Denies diplopia, ringing in the ears, headache or dizziness. Denies cough or shortness of breath.

## 2014-09-09 ENCOUNTER — Ambulatory Visit: Payer: Medicare Other

## 2014-09-09 ENCOUNTER — Telehealth: Payer: Self-pay | Admitting: Internal Medicine

## 2014-09-09 ENCOUNTER — Telehealth: Payer: Self-pay | Admitting: Medical Oncology

## 2014-09-09 ENCOUNTER — Ambulatory Visit (HOSPITAL_BASED_OUTPATIENT_CLINIC_OR_DEPARTMENT_OTHER): Payer: Medicare Other | Admitting: Nurse Practitioner

## 2014-09-09 ENCOUNTER — Other Ambulatory Visit: Payer: Self-pay | Admitting: Medical Oncology

## 2014-09-09 VITALS — BP 110/70 | HR 69 | Temp 98.4°F | Resp 18 | Ht 65.0 in | Wt 125.5 lb

## 2014-09-09 DIAGNOSIS — R630 Anorexia: Secondary | ICD-10-CM

## 2014-09-09 DIAGNOSIS — R112 Nausea with vomiting, unspecified: Secondary | ICD-10-CM

## 2014-09-09 DIAGNOSIS — C7951 Secondary malignant neoplasm of bone: Secondary | ICD-10-CM

## 2014-09-09 DIAGNOSIS — C3412 Malignant neoplasm of upper lobe, left bronchus or lung: Secondary | ICD-10-CM

## 2014-09-09 DIAGNOSIS — C7931 Secondary malignant neoplasm of brain: Secondary | ICD-10-CM

## 2014-09-09 DIAGNOSIS — E860 Dehydration: Secondary | ICD-10-CM

## 2014-09-09 MED ORDER — SODIUM CHLORIDE 0.9 % IV SOLN
1000.0000 mL | Freq: Once | INTRAVENOUS | Status: AC
  Administered 2014-09-09: 1000 mL via INTRAVENOUS

## 2014-09-09 MED ORDER — ONDANSETRON 8 MG PO TBDP: 8.0000 mg | ORAL_TABLET | Freq: Three times a day (TID) | ORAL | Status: DC | PRN

## 2014-09-09 MED ORDER — ONDANSETRON 8 MG/NS 50 ML IVPB
INTRAVENOUS | Status: AC
  Filled 2014-09-09: qty 8

## 2014-09-09 MED ORDER — ONDANSETRON 8 MG/50ML IVPB (CHCC)
8.0000 mg | Freq: Once | INTRAVENOUS | Status: AC
  Administered 2014-09-09: 8 mg via INTRAVENOUS

## 2014-09-09 NOTE — Telephone Encounter (Signed)
reprots nausea sdn vomiting since recetn chemo. She is vomiting gatorade. transferred call to [facility].

## 2014-09-09 NOTE — Patient Instructions (Signed)
Dehydration, Adult Dehydration is when you lose more fluids from the body than you take in. Vital organs like the kidneys, brain, and heart cannot function without a proper amount of fluids and salt. Any loss of fluids from the body can cause dehydration.  CAUSES   Vomiting.  Diarrhea.  Excessive sweating.  Excessive urine output.  Fever. SYMPTOMS  Mild dehydration  Thirst.  Dry lips.  Slightly dry mouth. Moderate dehydration  Very dry mouth.  Sunken eyes.  Skin does not bounce back quickly when lightly pinched and released.  Dark urine and decreased urine production.  Decreased tear production.  Headache. Severe dehydration  Very dry mouth.  Extreme thirst.  Rapid, weak pulse (more than 100 beats per minute at rest).  Cold hands and feet.  Not able to sweat in spite of heat and temperature.  Rapid breathing.  Blue lips.  Confusion and lethargy.  Difficulty being awakened.  Minimal urine production.  No tears. DIAGNOSIS  Your caregiver will diagnose dehydration based on your symptoms and your exam. Blood and urine tests will help confirm the diagnosis. The diagnostic evaluation should also identify the cause of dehydration. TREATMENT  Treatment of mild or moderate dehydration can often be done at home by increasing the amount of fluids that you drink. It is best to drink small amounts of fluid more often. Drinking too much at one time can make vomiting worse. Refer to the home care instructions below. Severe dehydration needs to be treated at the hospital where you will probably be given intravenous (IV) fluids that contain water and electrolytes. HOME CARE INSTRUCTIONS   Ask your caregiver about specific rehydration instructions.  Drink enough fluids to keep your urine clear or pale yellow.  Drink small amounts frequently if you have nausea and vomiting.  Eat as you normally do.  Avoid:  Foods or drinks high in sugar.  Carbonated  drinks.  Juice.  Extremely hot or cold fluids.  Drinks with caffeine.  Fatty, greasy foods.  Alcohol.  Tobacco.  Overeating.  Gelatin desserts.  Wash your hands well to avoid spreading bacteria and viruses.  Only take over-the-counter or prescription medicines for pain, discomfort, or fever as directed by your caregiver.  Ask your caregiver if you should continue all prescribed and over-the-counter medicines.  Keep all follow-up appointments with your caregiver. SEEK MEDICAL CARE IF:  You have abdominal pain and it increases or stays in one area (localizes).  You have a rash, stiff neck, or severe headache.  You are irritable, sleepy, or difficult to awaken.  You are weak, dizzy, or extremely thirsty. SEEK IMMEDIATE MEDICAL CARE IF:   You are unable to keep fluids down or you get worse despite treatment.  You have frequent episodes of vomiting or diarrhea.  You have blood or green matter (bile) in your vomit.  You have blood in your stool or your stool looks black and tarry.  You have not urinated in 6 to 8 hours, or you have only urinated a small amount of very dark urine.  You have a fever.  You faint. MAKE SURE YOU:   Understand these instructions.  Will watch your condition.  Will get help right away if you are not doing well or get worse. Document Released: 09/18/2005 Document Revised: 12/11/2011 Document Reviewed: 05/08/2011 ExitCare Patient Information 2015 [employer]. This information is not intended to replace advice given to you by your health care provider. Make sure you discuss any questions you have with your health care   provider.  

## 2014-09-09 NOTE — Telephone Encounter (Signed)
added appt per Diane...pt aware of d.t

## 2014-09-10 ENCOUNTER — Encounter: Payer: Self-pay | Admitting: Nurse Practitioner

## 2014-09-10 NOTE — Assessment & Plan Note (Signed)
Patient has been diagnosed with both bone metastasis and brain metastasis.  Patient last received Zometa on 09/01/2014.  She has plans to receive her next infusion of Zometa on 10/05/2013.

## 2014-09-10 NOTE — Assessment & Plan Note (Signed)
Patient has had little appetite; and is taking in little oral intake as well.  She is complaining of chronic nausea and vomiting.  Her antinausea medications at home have been ineffective.  Patient feels dehydrated today.  Patient will receive 1 L normal saline IV fluid rehydration today.  Also advised patient that she may very well need to return to the [facility] later this week for additional IV fluid rehydration.

## 2014-09-10 NOTE — Assessment & Plan Note (Signed)
Patient states that she has been experiencing nausea and vomiting since her last cycle of chemotherapy.  She has tried her antiemetics at home with little effectiveness.  Patient will receive 1 L normal saline IV fluid rehydration today; along with Zofran IV.  Patient was encouraged to try a clear liquid diet until she is able to tolerate a more advanced diet.

## 2014-09-10 NOTE — Assessment & Plan Note (Signed)
Patient is complaining of minimal appetite; and states she has had little oral intake.  Most likely this is secondary to chemotherapy.  Patient was encouraged to eat multiple small meals throughout the day if at all possible.

## 2014-09-10 NOTE — Progress Notes (Signed)
will   SYMPTOM MANAGEMENT CLINIC   HPI: Tamara Ortiz 73 y.o. female diagnosed with lung cancer.  Currently undergoing Alimta and Zometa therapy.  Patient called the cancer Center today requesting urgent care visit.  Patient received her last cycle of Alimta and Zometa on 09/01/2014.  Since receiving this last cycle of chemotherapy patient has been experiencing increasing nausea and vomiting.  She feels fairly dehydrated today.  She denies any diarrhea or constipation.  She denies any recent fevers or chills.   HPI  CURRENT THERAPY: Upcoming Treatment Dates - LUNG Pemetrexed (Alimta) q21d  Days with orders from any treatment category:  09/29/2014      SCHEDULING COMMUNICATION      ondansetron (ZOFRAN) IVPB 8 mg      dexamethasone (DECADRON) injection 10 mg      PEMEtrexed (ALIMTA) 875 mg in sodium chloride 0.9 % 100 mL chemo infusion      sodium chloride 0.9 % injection 10 mL      heparin lock flush 100 unit/mL      heparin lock flush 100 unit/mL      alteplase (CATHFLO ACTIVASE) injection 2 mg      sodium chloride 0.9 % injection 3 mL      0.9 %  sodium chloride infusion      TREATMENT CONDITIONS 10/27/2014      SCHEDULING COMMUNICATION      ondansetron (ZOFRAN) IVPB 8 mg      dexamethasone (DECADRON) injection 10 mg      PEMEtrexed (ALIMTA) 875 mg in sodium chloride 0.9 % 100 mL chemo infusion      sodium chloride 0.9 % injection 10 mL      heparin lock flush 100 unit/mL      heparin lock flush 100 unit/mL      alteplase (CATHFLO ACTIVASE) injection 2 mg      sodium chloride 0.9 % injection 3 mL      0.9 %  sodium chloride infusion      TREATMENT CONDITIONS 11/24/2014      SCHEDULING COMMUNICATION      ondansetron (ZOFRAN) IVPB 8 mg      dexamethasone (DECADRON) injection 10 mg      PEMEtrexed (ALIMTA) 875 mg in sodium chloride 0.9 % 100 mL chemo infusion      sodium chloride 0.9 % injection 10 mL      heparin lock flush 100 unit/mL      heparin lock flush 100 unit/mL      alteplase (CATHFLO ACTIVASE) injection 2 mg      sodium chloride 0.9 % injection 3 mL      0.9 %  sodium chloride infusion      TREATMENT CONDITIONS    ROS  Past Medical History  Diagnosis Date  . Hypertension   . HYPERLIPIDEMIA 11/05/2007  . ANXIETY 05/13/2009  . DEPRESSIVE DISORDER 05/06/2008  . HYPERTENSION 11/05/2007  . ALLERGIC RHINITIS 11/05/2007  . CONSTIPATION, CHRONIC 05/13/2009  . UTI 05/06/2008  . BACK PAIN 05/11/2008  . OSTEOPOROSIS 11/05/2007  . INSOMNIA-SLEEP DISORDER-UNSPEC 05/13/2009  . FATIGUE 05/13/2009  . Swelling, mass, or lump in chest 11/05/2007  . LUNG CANCER, HX OF 05/06/2008  . 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 Lung cancer   . Brain cancer     Past Surgical History  Procedure Laterality Date  . Tubal ligation    . S/p lul lung surgury  12/2007  . Lobectomy  has Non-small cell cancer of left lung; Hyperlipidemia; ANXIETY; Depression; Essential hypertension; ALLERGIC RHINITIS; CONSTIPATION, CHRONIC; BACK PAIN; OSTEOPOROSIS; Insomnia; Left knee pain; Preventative health care; Leg ulcer; Bone metastases; Diarrhea; Carpal tunnel syndrome, bilateral; Nausea with vomiting; Cancer; Dehydration; Anorexia; Weight loss; and Brain metastasis on her problem list.     is allergic to amoxicillin and levaquin.    Medication List       This list is accurate as of: 09/09/14 11:59 PM.  Always use your most recent med list.               ANTIOXIDANT A/C/E PO  Take by mouth daily.     aspirin 81 MG EC tablet  Take 81 mg by mouth daily.     calcium-vitamin D 500-200 MG-UNIT per tablet  Take 1 tablet by mouth 2 (two) times daily.     Flaxseed Oil 1000 MG Caps  Take 1,000 mg by mouth daily.     folic acid 1 MG tablet  Commonly known as:  FOLVITE  Take 1 tablet (1 mg total) by mouth daily.     HYDROcodone-acetaminophen 5-325 MG per tablet  Commonly known as:  NORCO  Take 1 tablet by mouth every 6 (six) hours as needed for moderate pain.       LORazepam 0.5 MG tablet  Commonly known as:  ATIVAN  Take 1 tablet (0.5 mg total) by mouth every 8 (eight) hours as needed for anxiety.     losartan-hydrochlorothiazide 50-12.5 MG per tablet  Commonly known as:  HYZAAR     magic mouthwash Soln  Take 5 mLs by mouth 4 (four) times daily as needed for mouth pain. Swish and spit     MIRALAX powder  Generic drug:  polyethylene glycol powder  Take 17 g by mouth daily.     Omega-3 350 MG Caps  Take by mouth daily.     ondansetron 8 MG disintegrating tablet  Commonly known as:  ZOFRAN ODT  Take 1 tablet (8 mg total) by mouth every 8 (eight) hours as needed for nausea or vomiting.     ondansetron 8 MG tablet  Commonly known as:  ZOFRAN  Take 1 tablet (8 mg total) by mouth every 8 (eight) hours as needed for nausea or vomiting.     PROBIOTIC FORMULA Caps  Take 1 capsule by mouth daily.     simvastatin 40 MG tablet  Commonly known as:  ZOCOR  Take 1 tablet (40 mg total) by mouth daily.     zolpidem 12.5 MG CR tablet  Commonly known as:  AMBIEN CR  TAKE 1 TABLET BY MOUTH EVERY NIGHT AT BEDTIME AS NEEDED FOR SLEEP     ZOMETA 4 MG/5ML injection  Generic drug:  zolendronic acid  Inject 4 mg into the vein once.         PHYSICAL EXAMINATION  Blood pressure 110/70, pulse 69, temperature 98.4 F (36.9 C), temperature source Oral, resp. rate 18, height 5\' 5"  (1.651 m), weight 125 lb 8 oz (56.926 kg).  Physical Exam  Constitutional: She is oriented to person, place, and time. Vital signs are normal. She appears malnourished and dehydrated. She appears cachectic.  HENT:  Head: Normocephalic and atraumatic.  Mouth/Throat: Oropharynx is clear and moist.  Eyes: Conjunctivae and EOM are normal. Pupils are equal, round, and reactive to light. Right eye exhibits no discharge. Left eye exhibits no discharge. No scleral icterus.  Neck: Normal range of motion. Neck supple. No JVD present. No tracheal  deviation present. No thyromegaly  present.  Cardiovascular: Normal rate, regular rhythm, normal heart sounds and intact distal pulses.   Pulmonary/Chest: Effort normal and breath sounds normal. No respiratory distress. She has no wheezes. She has no rales.  Abdominal: Soft. Bowel sounds are normal. She exhibits no distension and no mass. There is no tenderness. There is no rebound and no guarding.  Musculoskeletal: Normal range of motion. She exhibits no edema or tenderness.  Lymphadenopathy:    She has no cervical adenopathy.  Neurological: She is alert and oriented to person, place, and time. Gait normal.  Skin: Skin is warm and dry. No rash noted. No erythema.  Psychiatric: Affect normal.  Nursing note and vitals reviewed.   LABORATORY DATA:. No visits with results within 3 Day(s) from this visit. Latest known visit with results is:  Appointment on 09/01/2014  Component Date Value Ref Range Status  . WBC 09/01/2014 4.9  3.9 - 10.3 10e3/uL Final  . NEUT# 09/01/2014 3.1  1.5 - 6.5 10e3/uL Final  . HGB 09/01/2014 13.1  11.6 - 15.9 g/dL Final  . HCT 09/01/2014 39.3  34.8 - 46.6 % Final  . Platelets 09/01/2014 243  145 - 400 10e3/uL Final  . MCV 09/01/2014 97.0  79.5 - 101.0 fL Final  . MCH 09/01/2014 32.3  25.1 - 34.0 pg Final  . MCHC 09/01/2014 33.3  31.5 - 36.0 g/dL Final  . RBC 09/01/2014 4.05  3.70 - 5.45 10e6/uL Final  . RDW 09/01/2014 12.9  11.2 - 14.5 % Final  . lymph# 09/01/2014 1.4  0.9 - 3.3 10e3/uL Final  . MONO# 09/01/2014 0.4  0.1 - 0.9 10e3/uL Final  . Eosinophils Absolute 09/01/2014 0.0  0.0 - 0.5 10e3/uL Final  . Basophils Absolute 09/01/2014 0.0  0.0 - 0.1 10e3/uL Final  . NEUT% 09/01/2014 63.6  38.4 - 76.8 % Final  . LYMPH% 09/01/2014 27.6  14.0 - 49.7 % Final  . MONO% 09/01/2014 7.8  0.0 - 14.0 % Final  . EOS% 09/01/2014 0.4  0.0 - 7.0 % Final  . BASO% 09/01/2014 0.6  0.0 - 2.0 % Final  . Sodium 09/01/2014 138  136 - 145 mEq/L Final  . Potassium 09/01/2014 3.6  3.5 - 5.1 mEq/L Final  .  Chloride 09/01/2014 100  98 - 109 mEq/L Final  . CO2 09/01/2014 27  22 - 29 mEq/L Final  . Glucose 09/01/2014 92  70 - 140 mg/dl Final  . BUN 09/01/2014 11.8  7.0 - 26.0 mg/dL Final  . Creatinine 09/01/2014 0.9  0.6 - 1.1 mg/dL Final  . Total Bilirubin 09/01/2014 0.49  0.20 - 1.20 mg/dL Final  . Alkaline Phosphatase 09/01/2014 88  40 - 150 U/L Final  . AST 09/01/2014 24  5 - 34 U/L Final  . ALT 09/01/2014 15  0 - 55 U/L Final  . Total Protein 09/01/2014 7.2  6.4 - 8.3 g/dL Final  . Albumin 09/01/2014 4.0  3.5 - 5.0 g/dL Final  . Calcium 09/01/2014 10.5* 8.4 - 10.4 mg/dL Final  . Anion Gap 09/01/2014 11  3 - 11 mEq/L Final     RADIOGRAPHIC STUDIES: No results found.  ASSESSMENT/PLAN:    Anorexia Patient is complaining of minimal appetite; and states she has had little oral intake.  Most likely this is secondary to chemotherapy.  Patient was encouraged to eat multiple small meals throughout the day if at all possible.  Bone metastases Patient has been diagnosed with both bone metastasis and brain metastasis.  Patient last received Zometa on 09/01/2014.  She has plans to receive her next infusion of Zometa on 10/05/2013.  Dehydration Patient has had little appetite; and is taking in little oral intake as well.  She is complaining of chronic nausea and vomiting.  Her antinausea medications at home have been ineffective.  Patient feels dehydrated today.  Patient will receive 1 L normal saline IV fluid rehydration today.  Also advised patient that she may very well need to return to the [facility] later this week for additional IV fluid rehydration.  Nausea with vomiting Patient states that she has been experiencing nausea and vomiting since her last cycle of chemotherapy.  She has tried her antiemetics at home with little effectiveness.  Patient will receive 1 L normal saline IV fluid rehydration today; along with Zofran IV.  Patient was encouraged to try a clear liquid diet until she is  able to tolerate a more advanced diet.   Patient stated understanding of all instructions; and was in agreement with this plan of care. The patient knows to call the clinic with any problems, questions or concerns.   Review/collaboration with Dr. Julien Nordmann regarding all aspects of patient's visit today.   Total time spent with patient was 40 minutes;  with greater than 80 percent of that time spent in face to face counseling regarding her symptoms, frequent monitoring of patient during infusion, and coordination of care and follow up.  Disclaimer: This note was dictated with voice recognition software. Similar sounding words can inadvertently be transcribed and may not be corrected upon review.   Drue Second, NP 09/10/2014

## 2014-09-16 ENCOUNTER — Other Ambulatory Visit: Payer: Self-pay | Admitting: Physician Assistant

## 2014-09-21 ENCOUNTER — Other Ambulatory Visit: Payer: Self-pay | Admitting: *Deleted

## 2014-09-21 DIAGNOSIS — C3492 Malignant neoplasm of unspecified part of left bronchus or lung: Secondary | ICD-10-CM

## 2014-09-21 MED ORDER — HYDROCODONE-ACETAMINOPHEN 5-325 MG PO TABS: 1.0000 | ORAL_TABLET | Freq: Four times a day (QID) | ORAL | Status: DC | PRN

## 2014-09-28 ENCOUNTER — Ambulatory Visit: Payer: Medicare Other

## 2014-09-30 ENCOUNTER — Ambulatory Visit (HOSPITAL_COMMUNITY)
Admission: RE | Admit: 2014-09-30 | Discharge: 2014-09-30 | Disposition: A | Discharge status: Home / Self Care | Payer: Medicare Other | Source: Ambulatory Visit | Attending: Physician Assistant | Admitting: Physician Assistant

## 2014-09-30 DIAGNOSIS — C799 Secondary malignant neoplasm of unspecified site: Secondary | ICD-10-CM | POA: Diagnosis present

## 2014-09-30 DIAGNOSIS — C7951 Secondary malignant neoplasm of bone: Secondary | ICD-10-CM | POA: Insufficient documentation

## 2014-09-30 DIAGNOSIS — C3492 Malignant neoplasm of unspecified part of left bronchus or lung: Secondary | ICD-10-CM

## 2014-09-30 DIAGNOSIS — C3490 Malignant neoplasm of unspecified part of unspecified bronchus or lung: Secondary | ICD-10-CM | POA: Insufficient documentation

## 2014-09-30 MED ORDER — IOHEXOL 300 MG/ML  SOLN
100.0000 mL | Freq: Once | INTRAMUSCULAR | Status: AC | PRN
  Administered 2014-09-30: 100 mL via INTRAVENOUS

## 2014-10-05 ENCOUNTER — Telehealth: Payer: Self-pay | Admitting: Internal Medicine

## 2014-10-05 ENCOUNTER — Ambulatory Visit (HOSPITAL_BASED_OUTPATIENT_CLINIC_OR_DEPARTMENT_OTHER): Payer: Medicare Other

## 2014-10-05 ENCOUNTER — Other Ambulatory Visit (HOSPITAL_BASED_OUTPATIENT_CLINIC_OR_DEPARTMENT_OTHER): Payer: Medicare Other

## 2014-10-05 ENCOUNTER — Ambulatory Visit (HOSPITAL_BASED_OUTPATIENT_CLINIC_OR_DEPARTMENT_OTHER): Payer: Medicare Other | Admitting: Internal Medicine

## 2014-10-05 ENCOUNTER — Encounter: Payer: Self-pay | Admitting: Internal Medicine

## 2014-10-05 VITALS — BP 101/59 | HR 62 | Temp 98.3°F | Resp 18 | Ht 65.0 in | Wt 128.2 lb

## 2014-10-05 DIAGNOSIS — Z5111 Encounter for antineoplastic chemotherapy: Secondary | ICD-10-CM

## 2014-10-05 DIAGNOSIS — C7931 Secondary malignant neoplasm of brain: Secondary | ICD-10-CM | POA: Diagnosis not present

## 2014-10-05 DIAGNOSIS — C3412 Malignant neoplasm of upper lobe, left bronchus or lung: Secondary | ICD-10-CM | POA: Diagnosis present

## 2014-10-05 DIAGNOSIS — C3492 Malignant neoplasm of unspecified part of left bronchus or lung: Secondary | ICD-10-CM

## 2014-10-05 DIAGNOSIS — C801 Malignant (primary) neoplasm, unspecified: Secondary | ICD-10-CM

## 2014-10-05 DIAGNOSIS — C7951 Secondary malignant neoplasm of bone: Secondary | ICD-10-CM | POA: Diagnosis not present

## 2014-10-05 DIAGNOSIS — C3490 Malignant neoplasm of unspecified part of unspecified bronchus or lung: Secondary | ICD-10-CM

## 2014-10-05 LAB — CBC WITH DIFFERENTIAL/PLATELET
BASO%: 1.1 % (ref 0.0–2.0)
Basophils Absolute: 0.1 10*3/uL (ref 0.0–0.1)
EOS ABS: 0 10*3/uL (ref 0.0–0.5)
EOS%: 0.4 % (ref 0.0–7.0)
HCT: 36.4 % (ref 34.8–46.6)
HGB: 12.2 g/dL (ref 11.6–15.9)
LYMPH#: 1.3 10*3/uL (ref 0.9–3.3)
LYMPH%: 26.6 % (ref 14.0–49.7)
MCH: 32.3 pg (ref 25.1–34.0)
MCHC: 33.5 g/dL (ref 31.5–36.0)
MCV: 96.2 fL (ref 79.5–101.0)
MONO#: 0.5 10*3/uL (ref 0.1–0.9)
MONO%: 9.5 % (ref 0.0–14.0)
NEUT%: 62.4 % (ref 38.4–76.8)
NEUTROS ABS: 3.1 10*3/uL (ref 1.5–6.5)
Platelets: 222 10*3/uL (ref 145–400)
RBC: 3.78 10*6/uL (ref 3.70–5.45)
RDW: 13.1 % (ref 11.2–14.5)
WBC: 4.9 10*3/uL (ref 3.9–10.3)

## 2014-10-05 LAB — COMPREHENSIVE METABOLIC PANEL (CC13)
ALBUMIN: 3.6 g/dL (ref 3.5–5.0)
ALK PHOS: 67 U/L (ref 40–150)
ALT: 14 U/L (ref 0–55)
ANION GAP: 9 meq/L (ref 3–11)
AST: 22 U/L (ref 5–34)
BILIRUBIN TOTAL: 0.39 mg/dL (ref 0.20–1.20)
BUN: 16.8 mg/dL (ref 7.0–26.0)
CO2: 29 mEq/L (ref 22–29)
Calcium: 9.6 mg/dL (ref 8.4–10.4)
Chloride: 100 mEq/L (ref 98–109)
Creatinine: 1.2 mg/dL — ABNORMAL HIGH (ref 0.6–1.1)
EGFR: 46 mL/min/{1.73_m2} — AB (ref >90)
Glucose: 97 mg/dl (ref 70–140)
POTASSIUM: 4 meq/L (ref 3.5–5.1)
SODIUM: 139 meq/L (ref 136–145)
TOTAL PROTEIN: 6.5 g/dL (ref 6.4–8.3)

## 2014-10-05 MED ORDER — CYANOCOBALAMIN 1000 MCG/ML IJ SOLN
INTRAMUSCULAR | Status: AC
  Filled 2014-10-05: qty 1

## 2014-10-05 MED ORDER — ONDANSETRON 8 MG/NS 50 ML IVPB
INTRAVENOUS | Status: AC
  Filled 2014-10-05: qty 8

## 2014-10-05 MED ORDER — DEXAMETHASONE SODIUM PHOSPHATE 10 MG/ML IJ SOLN
INTRAMUSCULAR | Status: AC
  Filled 2014-10-05: qty 1

## 2014-10-05 MED ORDER — CYANOCOBALAMIN 1000 MCG/ML IJ SOLN
1000.0000 ug | Freq: Once | INTRAMUSCULAR | Status: AC
  Administered 2014-10-05: 1000 ug via INTRAMUSCULAR

## 2014-10-05 MED ORDER — HEPARIN SOD (PORK) LOCK FLUSH 100 UNIT/ML IV SOLN
500.0000 [IU] | Freq: Once | INTRAVENOUS | Status: DC | PRN
Start: 2014-10-05 — End: 2014-10-05
  Filled 2014-10-05: qty 5

## 2014-10-05 MED ORDER — DEXAMETHASONE SODIUM PHOSPHATE 10 MG/ML IJ SOLN
10.0000 mg | Freq: Once | INTRAMUSCULAR | Status: AC
  Administered 2014-10-05: 10 mg via INTRAVENOUS

## 2014-10-05 MED ORDER — SODIUM CHLORIDE 0.9 % IV SOLN
500.0000 mg/m2 | Freq: Once | INTRAVENOUS | Status: AC
  Administered 2014-10-05: 875 mg via INTRAVENOUS
  Filled 2014-10-05: qty 35

## 2014-10-05 MED ORDER — SODIUM CHLORIDE 0.9 % IV SOLN
Freq: Once | INTRAVENOUS | Status: AC
  Administered 2014-10-05: 15:00:00 via INTRAVENOUS

## 2014-10-05 MED ORDER — ONDANSETRON 8 MG/50ML IVPB (CHCC)
8.0000 mg | Freq: Once | INTRAVENOUS | Status: AC
  Administered 2014-10-05: 8 mg via INTRAVENOUS

## 2014-10-05 MED ORDER — SODIUM CHLORIDE 0.9 % IJ SOLN
10.0000 mL | INTRAMUSCULAR | Status: DC | PRN
  Filled 2014-10-05: qty 10

## 2014-10-05 NOTE — Patient Instructions (Signed)
[facility] Discharge Instructions for Patients Receiving Chemotherapy  Today you received the following chemotherapy agents Alimta.   To help prevent nausea and vomiting after your treatment, we encourage you to take your nausea medication as directed.    If you develop nausea and vomiting that is not controlled by your nausea medication, call the clinic.   BELOW ARE SYMPTOMS THAT SHOULD BE REPORTED IMMEDIATELY:  *FEVER GREATER THAN 100.5 F  *CHILLS WITH OR WITHOUT FEVER  NAUSEA AND VOMITING THAT IS NOT CONTROLLED WITH YOUR NAUSEA MEDICATION  *UNUSUAL SHORTNESS OF BREATH  *UNUSUAL BRUISING OR BLEEDING  TENDERNESS IN MOUTH AND THROAT WITH OR WITHOUT PRESENCE OF ULCERS  *URINARY PROBLEMS  *BOWEL PROBLEMS  UNUSUAL RASH Items with * indicate a potential emergency and should be followed up as soon as possible.  Feel free to call the clinic you have any questions or concerns. The clinic phone number is [phone].

## 2014-10-05 NOTE — Progress Notes (Signed)
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   PRIOR THERAPY:  1. Status post left upper lobectomy with mediastinal lymph node dissection under the care of Dr. Roxan Hockey on December 10, 2007.  2. Status post 4 cycles of adjuvant chemotherapy with cisplatin and Taxotere. Last dose was given 03/22/2008.  3. Status post 6 cycles of systemic chemotherapy with carboplatin and Alimta for metastatic disease in the bone. The last dose was given July 02, 2010, with stable disease.  4. Status post 12 cycles of maintenance Alimta at 500 mg/sq m given every 3 weeks. The last dose was given 05/08/2011. 5. Stereotactic radiotherapy to metastatic brain lesions under the care of Dr. Tammi Klippel completed on 07/30/2014.  CURRENT THERAPY:  1. Maintenance Alimta at 500 mg/sq m given every 4 weeks. The patient is status post 40 cycles.  2. Zometa 4 mg IV given every 2 months for bone metastasis.   CHEMOTHERAPY INTENT: Palliative  CURRENT # OF CHEMOTHERAPY CYCLES: 41 CURRENT ANTIEMETICS: none  CURRENT SMOKING STATUS: Never smoker   ORAL CHEMOTHERAPY AND CONSENT: n/a  CURRENT BISPHOSPHONATES USE: Yes, Zometa given every 2 months  PAIN MANAGEMENT: Norco  NARCOTICS INDUCED CONSTIPATION: Occasional, uses MiraLax  LIVING WILL AND CODE STATUS: Has a living will   INTERVAL HISTORY: Tamara Ortiz 74 y.o. female returns to the clinic today for followup visit. The patient is feeling fine today with no specific complaints except intermittent low back pain and mild fatigue. She is still complaining of lack of appetite but no significant weight loss. She has no chest pain, shortness of  breath, cough or hemoptysis. She denied having any significant nausea or vomiting. She has no fever or chills. She has no significant night sweats. She is tolerating her treatment with Alimta fairly well with no significant adverse effects. She had repeat CT scan of the chest, abdomen and pelvis performed recently and she is here for evaluation and discussion of her scan results.   MEDICAL HISTORY: Past Medical History  Diagnosis Date  . Hypertension   . HYPERLIPIDEMIA 11/05/2007  . ANXIETY 05/13/2009  . DEPRESSIVE DISORDER 05/06/2008  . HYPERTENSION 11/05/2007  . ALLERGIC RHINITIS 11/05/2007  . CONSTIPATION, CHRONIC 05/13/2009  . UTI 05/06/2008  . BACK PAIN 05/11/2008  . OSTEOPOROSIS 11/05/2007  . INSOMNIA-SLEEP DISORDER-UNSPEC 05/13/2009  . FATIGUE 05/13/2009  . Swelling, mass, or lump in chest 11/05/2007  . LUNG CANCER, HX OF 05/06/2008  . 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 Lung cancer   . Brain cancer     ALLERGIES:  is allergic to amoxicillin and levaquin.  MEDICATIONS:  Current Outpatient Prescriptions  Medication Sig Dispense Refill  . Alum & Mag Hydroxide-Simeth (MAGIC MOUTHWASH) SOLN Take 5 mLs by mouth 4 (four) times daily as needed for mouth pain. Swish and spit 240 mL 0  . aspirin 81 MG EC tablet Take 81 mg by mouth daily.      . Calcium Carbonate-Vitamin D (CALCIUM-VITAMIN D) 500-200 MG-UNIT per tablet Take 1 tablet by mouth 2 (two) times daily.      . Flaxseed, Linseed, (FLAXSEED OIL) 1000 MG CAPS Take 1,000 mg  by mouth daily.      . folic acid (FOLVITE) 1 MG tablet Take 1 tablet (1 mg total) by mouth daily. 90 tablet 0  . HYDROcodone-acetaminophen (NORCO) 5-325 MG per tablet Take 1 tablet by mouth every 6 (six) hours as needed for moderate pain. 30 tablet 0  . LORazepam (ATIVAN) 0.5 MG tablet Take 1 tablet (0.5 mg total) by mouth every 8 (eight) hours as needed for anxiety. 40 tablet 1  . losartan-hydrochlorothiazide (HYZAAR) 50-12.5 MG per tablet   3  . Multiple  Vitamin (ANTIOXIDANT A/C/E PO) Take by mouth daily.      . Omega-3 350 MG CAPS Take by mouth daily.      . ondansetron (ZOFRAN ODT) 8 MG disintegrating tablet Take 1 tablet (8 mg total) by mouth every 8 (eight) hours as needed for nausea or vomiting. 30 tablet 1  . ondansetron (ZOFRAN) 8 MG tablet TAKE 1 TABLET BY MOUTH EVERY 8 HOURS AS NEEDED FOR FOR NAUSEA AND VOMITING 20 tablet 0  . polyethylene glycol powder (MIRALAX) powder Take 17 g by mouth daily.      . Probiotic Product (PROBIOTIC FORMULA) CAPS Take 1 capsule by mouth daily.      . simvastatin (ZOCOR) 40 MG tablet Take 1 tablet (40 mg total) by mouth daily. 90 tablet 3  . zolendronic acid (ZOMETA) 4 MG/5ML injection Inject 4 mg into the vein once.      [name] zolpidem (AMBIEN CR) 12.5 MG CR tablet TAKE 1 TABLET BY MOUTH EVERY NIGHT AT BEDTIME AS NEEDED FOR SLEEP 90 tablet 1   No current facility-administered medications for this visit.    SURGICAL HISTORY:  Past Surgical History  Procedure Laterality Date  . Tubal ligation    . S/p lul lung surgury  12/2007  . Lobectomy      REVIEW OF SYSTEMS:  Constitutional: negative Eyes: negative Ears, nose, mouth, throat, and face: negative Respiratory: negative Cardiovascular: negative Gastrointestinal: negative Genitourinary:negative Integument/breast: negative Hematologic/lymphatic: negative Musculoskeletal:positive for arthralgias and back pain Neurological: negative Behavioral/Psych: negative Endocrine: negative Allergic/Immunologic: negative   PHYSICAL EXAMINATION: General appearance: alert, cooperative and no distress Head: Normocephalic, without obvious abnormality, atraumatic Neck: no adenopathy Lymph nodes: Cervical, supraclavicular, and axillary nodes normal. Resp: clear to auscultation bilaterally Back: symmetric, no curvature. ROM normal. No CVA tenderness. Cardio: regular rate and rhythm, S1, S2 normal, no murmur, click, rub or gallop GI: soft, non-tender; bowel sounds  normal; no masses,  no organomegaly Extremities: extremities normal, atraumatic, no cyanosis or edema Neurologic: Alert and oriented X 3, normal strength and tone. Normal symmetric reflexes. Normal coordination and gait  ECOG PERFORMANCE STATUS: 1 - Symptomatic but completely ambulatory  Blood pressure 101/59, pulse 62, temperature 98.3 F (36.8 C), temperature source Oral, resp. rate 18, height _0  (1.651 m), weight 128 lb 3.2 oz (58.151 kg), SpO2 97 %.  LABORATORY DATA: Lab Results  Component Value Date   WBC 4.9 10/05/2014   HGB 12.2 10/05/2014   HCT 36.4 10/05/2014   MCV 96.2 10/05/2014   PLT 222 10/05/2014      Chemistry      Component Value Date/Time   NA 139 10/05/2014 1254   NA 137 08/12/2014 1716   NA 140 04/17/2012 0855   K 4.0 10/05/2014 1254   K 3.7 08/12/2014 1716   K 4.1 04/17/2012 0855   CL 99 08/12/2014 1716   CL 100 03/04/2013 0901   CL 99 04/17/2012 0855   CO2 29 10/05/2014 1254  CO2 35* 08/12/2014 1716   CO2 29 04/17/2012 0855   BUN 16.8 10/05/2014 1254   BUN 16 08/12/2014 1716   BUN 15 04/17/2012 0855   CREATININE 1.2* 10/05/2014 1254   CREATININE 1.1 08/12/2014 1716   CREATININE 1.4* 04/17/2012 0855      Component Value Date/Time   CALCIUM 9.6 10/05/2014 1254   CALCIUM 10.6* 08/12/2014 1716   CALCIUM 9.5 04/17/2012 0855   ALKPHOS 67 10/05/2014 1254   ALKPHOS 79 08/12/2014 1716   ALKPHOS 68 04/17/2012 0855   AST 22 10/05/2014 1254   AST 28 08/12/2014 1716   AST 29 04/17/2012 0855   ALT 14 10/05/2014 1254   ALT 24 08/12/2014 1716   ALT 31 04/17/2012 0855   BILITOT 0.39 10/05/2014 1254   BILITOT 0.4 08/12/2014 1716   BILITOT 0.60 04/17/2012 0855       RADIOGRAPHIC STUDIES: Ct Chest W Contrast  09/30/2014   CLINICAL DATA:  Followup metastatic left lung non-small cell carcinoma. Chemotherapy and progress. Restaging.  EXAM: CT CHEST, ABDOMEN, AND PELVIS WITH CONTRAST  TECHNIQUE: Multidetector CT imaging of the chest, abdomen and pelvis  was performed following the standard protocol during bolus administration of intravenous contrast.  CONTRAST:  160m OMNIPAQUE IOHEXOL 300 MG/ML  SOLN  COMPARISON:  06/29/2014  FINDINGS: CT CHEST FINDINGS  Mediastinum/Hilar Regions: No masses or pathologically enlarged lymph nodes identified.  Other Thoracic Lymphadenopathy:  None.  Lungs:  No pulmonary infiltrate or mass identified.  Pleura:  No evidence of effusion or mass.  Vascular/Cardiac:  No acute findings identified.  Other:  None.  Musculoskeletal: Small sclerotic metastases in the thoracic spine are stable.  CT ABDOMEN AND PELVIS FINDINGS  Hepatobiliary: No masses or other significant abnormality identified.  Pancreas: No mass, inflammatory changes, or other parenchymal abnormality identified.  Spleen:  Within normal limits in size and appearance.  Adrenal Glands:  No mass identified.  Kidneys/Urinary Tract: No masses identified. No evidence of hydronephrosis.  Stomach/Bowel/Peritoneum: No evidence of wall thickening, mass, or obstruction.  Vascular/Lymphatic: No pathologically enlarged lymph nodes identified. No other significant abnormality identified.  Reproductive:  No mass or other significant abnormality identified.  Other:  None.  Musculoskeletal: Sclerotic bone metastases involving the sacrum and pelvis remain stable.  IMPRESSION: Stable appearance of sclerotic bone metastases.  No evidence of soft tissue metastatic disease or other acute findings.   Electronically Signed   By: JEarle GellM.D.   On: 09/30/2014 14:57   Ct Abdomen Pelvis W Contrast  09/30/2014   CLINICAL DATA:  Followup metastatic left lung non-small cell carcinoma. Chemotherapy and progress. Restaging.  EXAM: CT CHEST, ABDOMEN, AND PELVIS WITH CONTRAST  TECHNIQUE: Multidetector CT imaging of the chest, abdomen and pelvis was performed following the standard protocol during bolus administration of intravenous contrast.  CONTRAST:  1046mOMNIPAQUE IOHEXOL 300 MG/ML  SOLN   COMPARISON:  06/29/2014  FINDINGS: CT CHEST FINDINGS  Mediastinum/Hilar Regions: No masses or pathologically enlarged lymph nodes identified.  Other Thoracic Lymphadenopathy:  None.  Lungs:  No pulmonary infiltrate or mass identified.  Pleura:  No evidence of effusion or mass.  Vascular/Cardiac:  No acute findings identified.  Other:  None.  Musculoskeletal: Small sclerotic metastases in the thoracic spine are stable.  CT ABDOMEN AND PELVIS FINDINGS  Hepatobiliary: No masses or other significant abnormality identified.  Pancreas: No mass, inflammatory changes, or other parenchymal abnormality identified.  Spleen:  Within normal limits in size and appearance.  Adrenal Glands:  No mass identified.  Kidneys/Urinary  Tract: No masses identified. No evidence of hydronephrosis.  Stomach/Bowel/Peritoneum: No evidence of wall thickening, mass, or obstruction.  Vascular/Lymphatic: No pathologically enlarged lymph nodes identified. No other significant abnormality identified.  Reproductive:  No mass or other significant abnormality identified.  Other:  None.  Musculoskeletal: Sclerotic bone metastases involving the sacrum and pelvis remain stable.  IMPRESSION: Stable appearance of sclerotic bone metastases.  No evidence of soft tissue metastatic disease or other acute findings.   Electronically Signed   By: Earle Gell M.D.   On: 09/30/2014 14:57    ASSESSMENT AND PLAN: This is a very pleasant 73 years old white female with recurrent non-small cell lung cancer, adenocarcinoma. The patient is currently on maintenance Alimta monthly status post total of 40 cycles.  The patient is feeling fine and tolerating the treatment well. The recent CT scan of the chest, abdomen and pelvis showed no evidence for disease progression. I discussed the scan results with the patient today. I recommended for the patient to continue her current treatment with single agent Alimta as scheduled. She would proceed with cycle #41today as  scheduled. She would come back for followup visit in one month for reevaluation with the next cycle of her chemotherapy. The patient was advised to call immediately if she has any concerning symptoms in the interval.  The patient voices understanding of current disease status and treatment options and is in agreement with the current care plan.  All questions were answered. The patient knows to call the clinic with any problems, questions or concerns. We can certainly see the patient much sooner if necessary.  Disclaimer: This note was dictated with voice recognition software. Similar sounding words can inadvertently be transcribed and may not be corrected upon review.

## 2014-10-05 NOTE — Telephone Encounter (Signed)
gv and printed appt sched and avs for pt for Jan and Feb.....sed added tx.

## 2014-10-06 ENCOUNTER — Other Ambulatory Visit: Payer: Self-pay | Admitting: Radiation Therapy

## 2014-10-06 DIAGNOSIS — C7931 Secondary malignant neoplasm of brain: Secondary | ICD-10-CM

## 2014-10-12 ENCOUNTER — Telehealth: Payer: Self-pay | Admitting: Medical Oncology

## 2014-10-12 DIAGNOSIS — C3492 Malignant neoplasm of unspecified part of left bronchus or lung: Secondary | ICD-10-CM

## 2014-10-12 NOTE — Telephone Encounter (Signed)
"  I am having a lot of new back pain for the past 7 days, It wakes me up at night at 0400 then i toss and turn all night. , so I am not sleeping." She also reports vomiting last week and again on sat.  Onc tx request sent for tomorrow.

## 2014-10-13 ENCOUNTER — Ambulatory Visit (HOSPITAL_BASED_OUTPATIENT_CLINIC_OR_DEPARTMENT_OTHER): Payer: Medicare Other | Admitting: Nurse Practitioner

## 2014-10-13 ENCOUNTER — Other Ambulatory Visit (HOSPITAL_BASED_OUTPATIENT_CLINIC_OR_DEPARTMENT_OTHER): Payer: Medicare Other

## 2014-10-13 VITALS — BP 112/63 | HR 68 | Temp 98.2°F | Resp 18 | Ht 65.0 in | Wt 126.7 lb

## 2014-10-13 DIAGNOSIS — C3412 Malignant neoplasm of upper lobe, left bronchus or lung: Secondary | ICD-10-CM

## 2014-10-13 DIAGNOSIS — D709 Neutropenia, unspecified: Secondary | ICD-10-CM

## 2014-10-13 DIAGNOSIS — C7931 Secondary malignant neoplasm of brain: Secondary | ICD-10-CM

## 2014-10-13 DIAGNOSIS — C3492 Malignant neoplasm of unspecified part of left bronchus or lung: Secondary | ICD-10-CM

## 2014-10-13 DIAGNOSIS — G893 Neoplasm related pain (acute) (chronic): Secondary | ICD-10-CM

## 2014-10-13 DIAGNOSIS — C7951 Secondary malignant neoplasm of bone: Secondary | ICD-10-CM

## 2014-10-13 LAB — CBC WITH DIFFERENTIAL/PLATELET
BASO%: 0.7 % (ref 0.0–2.0)
BASOS ABS: 0 10*3/uL (ref 0.0–0.1)
EOS%: 0.7 % (ref 0.0–7.0)
Eosinophils Absolute: 0 10*3/uL (ref 0.0–0.5)
HCT: 36.4 % (ref 34.8–46.6)
HEMOGLOBIN: 12.1 g/dL (ref 11.6–15.9)
LYMPH%: 47 % (ref 14.0–49.7)
MCH: 31.9 pg (ref 25.1–34.0)
MCHC: 33.2 g/dL (ref 31.5–36.0)
MCV: 96 fL (ref 79.5–101.0)
MONO#: 0.3 10*3/uL (ref 0.1–0.9)
MONO%: 10.4 % (ref 0.0–14.0)
NEUT#: 1.1 10*3/uL — ABNORMAL LOW (ref 1.5–6.5)
NEUT%: 41.2 % (ref 38.4–76.8)
PLATELETS: 166 10*3/uL (ref 145–400)
RBC: 3.79 10*6/uL (ref 3.70–5.45)
RDW: 12.7 % (ref 11.2–14.5)
WBC: 2.7 10*3/uL — ABNORMAL LOW (ref 3.9–10.3)
lymph#: 1.3 10*3/uL (ref 0.9–3.3)

## 2014-10-13 LAB — COMPREHENSIVE METABOLIC PANEL (CC13)
ALK PHOS: 75 U/L (ref 40–150)
ALT: 20 U/L (ref 0–55)
AST: 23 U/L (ref 5–34)
Albumin: 3.9 g/dL (ref 3.5–5.0)
Anion Gap: 8 mEq/L (ref 3–11)
BILIRUBIN TOTAL: 0.5 mg/dL (ref 0.20–1.20)
BUN: 9.7 mg/dL (ref 7.0–26.0)
CO2: 29 mEq/L (ref 22–29)
CREATININE: 1 mg/dL (ref 0.6–1.1)
Calcium: 9.1 mg/dL (ref 8.4–10.4)
Chloride: 100 mEq/L (ref 98–109)
EGFR: 54 mL/min/{1.73_m2} — ABNORMAL LOW (ref >90)
Glucose: 93 mg/dl (ref 70–140)
POTASSIUM: 3.9 meq/L (ref 3.5–5.1)
Sodium: 137 mEq/L (ref 136–145)
Total Protein: 6.7 g/dL (ref 6.4–8.3)

## 2014-10-14 ENCOUNTER — Telehealth: Payer: Self-pay | Admitting: *Deleted

## 2014-10-14 ENCOUNTER — Encounter: Payer: Self-pay | Admitting: Nurse Practitioner

## 2014-10-14 DIAGNOSIS — D709 Neutropenia, unspecified: Secondary | ICD-10-CM | POA: Insufficient documentation

## 2014-10-14 DIAGNOSIS — G893 Neoplasm related pain (acute) (chronic): Secondary | ICD-10-CM | POA: Insufficient documentation

## 2014-10-14 NOTE — Assessment & Plan Note (Signed)
Patient receives her Zometa infusions on an every two-month basis for her previously diagnosed bone metastasis.  Patient last received Zometa infusion on 09/01/2014. She is scheduled to return 11/02/2014 for her next Zometa infusion.

## 2014-10-14 NOTE — Assessment & Plan Note (Signed)
Patient was previously diagnosed with bone metastasis; with lesions to both her thoracic and lumbar spine.  Patient is complaining of a mild increase in her chronic pain.  She states that she only takes hydrocodone 5/325 one tablet every 6 hours.  Patient denies any UTI symptoms.  Patient denies any recent falls or injuries to her back.  Patient's restaging CT obtained on 09/30/2014 showed stable metastatic disease to the spine.  Long discussion with patient today regarding the possibility of developing a tolerance to such a low dose of pain medication.  Advised patient to try taking hydrocodone 1 tablet every 4 hours; and also advised patient that she may increase the hydrocodone up to 2 tablets every 4-6 hours if needed.

## 2014-10-14 NOTE — Assessment & Plan Note (Signed)
Patient received cycle 41 of her single agent Alimta chemotherapy on 10/05/2014.  She is scheduled to return for cycle 42 of the same regimen on 11/02/2014.

## 2014-10-14 NOTE — Progress Notes (Signed)
will   SYMPTOM MANAGEMENT CLINIC   HPI: Tamara Ortiz 74 y.o. female diagnosed with lung cancer; with metastasis to both the brain and bone.  Currently undergoing Alimta and Zometa therapy.  Patient called the cancer Center today requesting urgent care visit.  Patient is complaining of increased mid to lower back pain.  Patient has presented been diagnosed with both thoracic and lumbar bone metastasis to the spine.  Patient states that she has been taking 1 hydrocodone 5/325 tablet every 6 hours for quite some time now; but she is now awakening in the middle the night due to her back pain.  Patient denies any recent injury or fall.  Patient denies any UTI symptoms or other new complaints.  Patient denies any recent fevers or chills.  Restaging CT obtained on 09/30/2014 revealed stable disease to the spine.   HPI  CURRENT THERAPY: Upcoming Treatment Dates - LUNG Pemetrexed (Alimta) q21d  Days with orders from any treatment category:  11/08/2014      SCHEDULING COMMUNICATION      ondansetron (ZOFRAN) IVPB 8 mg      dexamethasone (DECADRON) injection 10 mg      PEMEtrexed (ALIMTA) 875 mg in sodium chloride 0.9 % 100 mL chemo infusion      sodium chloride 0.9 % injection 10 mL      heparin lock flush 100 unit/mL      heparin lock flush 100 unit/mL      alteplase (CATHFLO ACTIVASE) injection 2 mg      sodium chloride 0.9 % injection 3 mL      0.9 %  sodium chloride infusion      TREATMENT CONDITIONS 12/06/2014      SCHEDULING COMMUNICATION      ondansetron (ZOFRAN) IVPB 8 mg      dexamethasone (DECADRON) injection 10 mg      PEMEtrexed (ALIMTA) 875 mg in sodium chloride 0.9 % 100 mL chemo infusion      sodium chloride 0.9 % injection 10 mL      heparin lock flush 100 unit/mL      heparin lock flush 100 unit/mL      alteplase (CATHFLO ACTIVASE) injection 2 mg      sodium chloride 0.9 % injection 3 mL      0.9 %  sodium chloride infusion      TREATMENT CONDITIONS 01/03/2015  SCHEDULING COMMUNICATION      ondansetron (ZOFRAN) IVPB 8 mg      dexamethasone (DECADRON) injection 10 mg      PEMEtrexed (ALIMTA) 875 mg in sodium chloride 0.9 % 100 mL chemo infusion      sodium chloride 0.9 % injection 10 mL      heparin lock flush 100 unit/mL      heparin lock flush 100 unit/mL      alteplase (CATHFLO ACTIVASE) injection 2 mg      sodium chloride 0.9 % injection 3 mL      0.9 %  sodium chloride infusion      TREATMENT CONDITIONS    ROS  Past Medical History  Diagnosis Date  . Hypertension   . HYPERLIPIDEMIA 11/05/2007  . ANXIETY 05/13/2009  . DEPRESSIVE DISORDER 05/06/2008  . HYPERTENSION 11/05/2007  . ALLERGIC RHINITIS 11/05/2007  . CONSTIPATION, CHRONIC 05/13/2009  . UTI 05/06/2008  . BACK PAIN 05/11/2008  . OSTEOPOROSIS 11/05/2007  . INSOMNIA-SLEEP DISORDER-UNSPEC 05/13/2009  . FATIGUE 05/13/2009  . Swelling, mass, or lump in chest 11/05/2007  . LUNG CANCER, HX OF 05/06/2008  .  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 Lung cancer   . Brain cancer     Past Surgical History  Procedure Laterality Date  . Tubal ligation    . S/p lul lung surgury  12/2007  . Lobectomy      has Non-small cell cancer of left lung; Hyperlipidemia; ANXIETY; Depression; Essential hypertension; ALLERGIC RHINITIS; CONSTIPATION, CHRONIC; Backache; OSTEOPOROSIS; Insomnia; Left knee pain; Preventative health care; Leg ulcer; Bone metastases; Diarrhea; Carpal tunnel syndrome, bilateral; Nausea with vomiting; Cancer; Dehydration; Anorexia; Weight loss; Brain metastasis; Neoplasm related pain; and Neutropenia on her problem list.     is allergic to amoxicillin and levaquin.    Medication List       This list is accurate as of: 10/13/14 11:59 PM.  Always use your most recent med list.               ANTIOXIDANT A/C/E PO  Take by mouth daily.     aspirin 81 MG EC tablet  Take 81 mg by mouth daily.     calcium-vitamin D 500-200 MG-UNIT per tablet  Take 1 tablet by mouth 2  (two) times daily.     Flaxseed Oil 1000 MG Caps  Take 1,000 mg by mouth daily.     folic acid 1 MG tablet  Commonly known as:  FOLVITE  Take 1 tablet (1 mg total) by mouth daily.     HYDROcodone-acetaminophen 5-325 MG per tablet  Commonly known as:  NORCO  Take 1 tablet by mouth every 6 (six) hours as needed for moderate pain.     LORazepam 0.5 MG tablet  Commonly known as:  ATIVAN  Take 1 tablet (0.5 mg total) by mouth every 8 (eight) hours as needed for anxiety.     losartan-hydrochlorothiazide 50-12.5 MG per tablet  Commonly known as:  HYZAAR     magic mouthwash Soln  Take 5 mLs by mouth 4 (four) times daily as needed for mouth pain. Swish and spit     MIRALAX powder  Generic drug:  polyethylene glycol powder  Take 17 g by mouth daily.     Omega-3 350 MG Caps  Take by mouth daily.     ondansetron 8 MG disintegrating tablet  Commonly known as:  ZOFRAN ODT  Take 1 tablet (8 mg total) by mouth every 8 (eight) hours as needed for nausea or vomiting.     ondansetron 8 MG tablet  Commonly known as:  ZOFRAN  TAKE 1 TABLET BY MOUTH EVERY 8 HOURS AS NEEDED FOR FOR NAUSEA AND VOMITING     PROBIOTIC FORMULA Caps  Take 1 capsule by mouth daily.     simvastatin 40 MG tablet  Commonly known as:  ZOCOR  Take 1 tablet (40 mg total) by mouth daily.     zolpidem 12.5 MG CR tablet  Commonly known as:  AMBIEN CR  TAKE 1 TABLET BY MOUTH EVERY NIGHT AT BEDTIME AS NEEDED FOR SLEEP     ZOMETA 4 MG/5ML injection  Generic drug:  zolendronic acid  Inject 4 mg into the vein once.         PHYSICAL EXAMINATION  Blood pressure 112/63, pulse 68, temperature 98.2 F (36.8 C), temperature source Oral, resp. rate 18, height _0  (1.651 m), weight 126 lb 11.2 oz (57.471 kg), SpO2 100 %.  Physical Exam  Constitutional: She is oriented to person, place, and time. Vital signs are normal. She appears unhealthy.  HENT:  Head: Normocephalic and atraumatic.  Mouth/Throat: Oropharynx is  clear and moist.  Eyes: Conjunctivae and EOM are normal. Pupils are equal, round, and reactive to light. Right eye exhibits no discharge. Left eye exhibits no discharge. No scleral icterus.  Neck: Normal range of motion. Neck supple. No JVD present. No tracheal deviation present. No thyromegaly present.  Cardiovascular: Normal rate, regular rhythm, normal heart sounds and intact distal pulses.   Pulmonary/Chest: Effort normal and breath sounds normal. No respiratory distress.  Abdominal: Soft. Bowel sounds are normal. She exhibits no distension and no mass. There is no tenderness. There is no rebound and no guarding.  Musculoskeletal: Normal range of motion. She exhibits tenderness. She exhibits no edema.  Mild tenderness to the mid and lower back with palpation.  Patient observed with full range of motion and ambulating with no assistance.  Lymphadenopathy:    She has no cervical adenopathy.  Neurological: She is alert and oriented to person, place, and time. Gait normal.  Skin: Skin is warm and dry. No rash noted. No erythema.  Psychiatric: Affect normal.  Nursing note and vitals reviewed.   LABORATORY DATA:. Appointment on 10/13/2014  Component Date Value Ref Range Status  . WBC 10/13/2014 2.7* 3.9 - 10.3 10e3/uL Final  . NEUT# 10/13/2014 1.1* 1.5 - 6.5 10e3/uL Final  . HGB 10/13/2014 12.1  11.6 - 15.9 g/dL Final  . HCT 10/13/2014 36.4  34.8 - 46.6 % Final  . Platelets 10/13/2014 166  145 - 400 10e3/uL Final  . MCV 10/13/2014 96.0  79.5 - 101.0 fL Final  . MCH 10/13/2014 31.9  25.1 - 34.0 pg Final  . MCHC 10/13/2014 33.2  31.5 - 36.0 g/dL Final  . RBC 10/13/2014 3.79  3.70 - 5.45 10e6/uL Final  . RDW 10/13/2014 12.7  11.2 - 14.5 % Final  . lymph# 10/13/2014 1.3  0.9 - 3.3 10e3/uL Final  . MONO# 10/13/2014 0.3  0.1 - 0.9 10e3/uL Final  . Eosinophils Absolute 10/13/2014 0.0  0.0 - 0.5 10e3/uL Final  . Basophils Absolute 10/13/2014 0.0  0.0 - 0.1 10e3/uL Final  . NEUT% 10/13/2014  41.2  38.4 - 76.8 % Final  . LYMPH% 10/13/2014 47.0  14.0 - 49.7 % Final  . MONO% 10/13/2014 10.4  0.0 - 14.0 % Final  . EOS% 10/13/2014 0.7  0.0 - 7.0 % Final  . BASO% 10/13/2014 0.7  0.0 - 2.0 % Final  . Sodium 10/13/2014 137  136 - 145 mEq/L Final  . Potassium 10/13/2014 3.9  3.5 - 5.1 mEq/L Final  . Chloride 10/13/2014 100  98 - 109 mEq/L Final  . CO2 10/13/2014 29  22 - 29 mEq/L Final  . Glucose 10/13/2014 93  70 - 140 mg/dl Final  . BUN 10/13/2014 9.7  7.0 - 26.0 mg/dL Final  . Creatinine 10/13/2014 1.0  0.6 - 1.1 mg/dL Final  . Total Bilirubin 10/13/2014 0.50  0.20 - 1.20 mg/dL Final  . Alkaline Phosphatase 10/13/2014 75  40 - 150 U/L Final  . AST 10/13/2014 23  5 - 34 U/L Final  . ALT 10/13/2014 20  0 - 55 U/L Final  . Total Protein 10/13/2014 6.7  6.4 - 8.3 g/dL Final  . Albumin 10/13/2014 3.9  3.5 - 5.0 g/dL Final  . Calcium 10/13/2014 9.1  8.4 - 10.4 mg/dL Final  . Anion Gap 10/13/2014 8  3 - 11 mEq/L Final  . EGFR 10/13/2014 54* >90 ml/min/1.73 m2 Final   eGFR is calculated using the  CKD-EPI Creatinine Equation (2009)     RADIOGRAPHIC STUDIES: No results found.  ASSESSMENT/PLAN:    Bone metastases Patient receives her Zometa infusions on an every two-month basis for her previously diagnosed bone metastasis.  Patient last received Zometa infusion on 09/01/2014. She is scheduled to return 11/02/2014 for her next Zometa infusion.   Brain metastasis Patient is also been diagnosed with brain metastasis as well.  Patient underwent stereotactic radiotherapy to the brain in October 2015.  She is scheduled for a restaging brain MRI on 10/27/2014.   Neoplasm related pain Patient was previously diagnosed with bone metastasis; with lesions to both her thoracic and lumbar spine.  Patient is complaining of a mild increase in her chronic pain.  She states that she only takes hydrocodone 5/325 one tablet every 6 hours.  Patient denies any UTI symptoms.  Patient denies any recent  falls or injuries to her back.  Patient's restaging CT obtained on 09/30/2014 showed stable metastatic disease to the spine.  Long discussion with patient today regarding the possibility of developing a tolerance to such a low dose of pain medication.  Advised patient to try taking hydrocodone 1 tablet every 4 hours; and also advised patient that she may increase the hydrocodone up to 2 tablets every 4-6 hours if needed.    Neutropenia Labs drawn today did reveal a mild neutropenia with an ANC of 1.1.  Briefly reviewed all neutropenia guidelines with the patient today.   Non-small cell cancer of left lung Patient received cycle 41 of her single agent Alimta chemotherapy on 10/05/2014.  She is scheduled to return for cycle 42 of the same regimen on 11/02/2014.   Patient stated understanding of all instructions; and was in agreement with this plan of care. The patient knows to call the clinic with any problems, questions or concerns.   Review/collaboration with Dr. Julien Nordmann regarding all aspects of patient's visit today.   Total time spent with patient was 25 minutes;  with greater than 75 percent of that time spent in face to face counseling regarding her symptoms, and coordination of care and follow up.  Disclaimer: This note was dictated with voice recognition software. Similar sounding words can inadvertently be transcribed and may not be corrected upon review.   Drue Second, NP 10/14/2014

## 2014-10-14 NOTE — Telephone Encounter (Signed)
Called patient to follow-up visit with Tamara Ortiz. Patient states that she feels better, took her pain meds and slept well. Rates her back pain as a 3. Patient advised to call with any questions or concerns. Patient verbalized understanding.

## 2014-10-14 NOTE — Assessment & Plan Note (Signed)
Patient is also been diagnosed with brain metastasis as well.  Patient underwent stereotactic radiotherapy to the brain in October 2015.  She is scheduled for a restaging brain MRI on 10/27/2014.

## 2014-10-14 NOTE — Assessment & Plan Note (Signed)
Labs drawn today did reveal a mild neutropenia with an ANC of 1.1.  Briefly reviewed all neutropenia guidelines with the patient today.

## 2014-10-20 ENCOUNTER — Other Ambulatory Visit: Payer: Self-pay | Admitting: Internal Medicine

## 2014-10-20 DIAGNOSIS — C3492 Malignant neoplasm of unspecified part of left bronchus or lung: Secondary | ICD-10-CM

## 2014-10-20 DIAGNOSIS — Z1231 Encounter for screening mammogram for malignant neoplasm of breast: Secondary | ICD-10-CM

## 2014-10-26 NOTE — Progress Notes (Signed)
Radiation Oncology         [phone]   Name: Tamara Ortiz   Date: 10/28/2014   MRN: [id number]  DOB: 1941-03-11    Multidisciplinary Brain and Spine Oncology Clinic Follow-Up Visit Note  CC: [name], MD  Biagio Borg, MD    ICD-9-CM ICD-10-CM   1. 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 On 07/30/2014 she was treated to 4 targets to 20 Gy: Right frontal 5.7 mm Right frontal 5.4 mm  Right frontal 5 mm  Left frontal 2 mm   Interval Since Last Radiation:  3  months  Narrative:  The patient returns today for routine follow-up.  The recent films were presented in our multidisciplinary conference with neuroradiology just prior to the clinic.  She has had some nausea attributed to Alimta.                              ALLERGIES:  is allergic to amoxicillin and levaquin.  Meds: Current Outpatient Prescriptions  Medication Sig Dispense Refill  . Alum & Mag Hydroxide-Simeth (MAGIC MOUTHWASH) SOLN Take 5 mLs by mouth 4 (four) times daily as needed for mouth pain. Swish and spit 240 mL 0  . aspirin 81 MG EC tablet Take 81 mg by mouth daily.      . Calcium Carbonate-Vitamin D (CALCIUM-VITAMIN D) 500-200 MG-UNIT per tablet Take 1 tablet by mouth 2 (two) times daily.      . Flaxseed, Linseed, (FLAXSEED OIL) 1000 MG CAPS Take 1,000 mg by mouth daily.      . folic acid (FOLVITE) 1 MG tablet TAKE 1 TABLET BY MOUTH DAILY 90 tablet 0  . HYDROcodone-acetaminophen (NORCO) 5-325 MG per tablet Take 1 tablet by mouth every 6 (six) hours as needed for moderate pain. 30 tablet 0  . LORazepam (ATIVAN) 0.5 MG tablet Take 1 tablet (0.5 mg total) by mouth every 8 (eight) hours as needed for anxiety. 40 tablet 1  . losartan-hydrochlorothiazide (HYZAAR) 50-12.5  MG per tablet   3  . Multiple Vitamin (ANTIOXIDANT A/C/E PO) Take by mouth daily.      . Omega-3 350 MG CAPS Take by mouth daily.      . ondansetron (ZOFRAN ODT) 8 MG disintegrating tablet Take 1 tablet (8 mg total) by mouth every 8 (eight) hours as needed for nausea or vomiting. 30 tablet 1  . ondansetron (ZOFRAN) 8 MG tablet TAKE 1 TABLET BY MOUTH EVERY 8 HOURS AS NEEDED FOR FOR NAUSEA AND VOMITING 20 tablet 0  . polyethylene glycol powder (MIRALAX) powder Take 17 g by mouth daily.      . Probiotic Product (PROBIOTIC FORMULA) CAPS Take 1 capsule by mouth daily.      . simvastatin (ZOCOR) 40 MG tablet Take 1 tablet (40 mg total) by mouth daily. 90 tablet 3  . zolendronic acid (ZOMETA) 4 MG/5ML injection Inject 4 mg into the vein once.      [name] zolpidem (AMBIEN CR) 12.5 MG CR tablet TAKE 1 TABLET BY MOUTH EVERY NIGHT AT BEDTIME AS NEEDED FOR SLEEP 90 tablet 1   No current facility-administered medications for this encounter.    Physical Findings: The patient is in no acute distress. Patient is alert and oriented.  vitals were not taken for this visit.[name]  No significant changes.  Lab Findings: Lab Results  Component Value Date   WBC 2.7* 10/13/2014   HGB 12.1 10/13/2014   HCT 36.4 10/13/2014   MCV 96.0 10/13/2014   PLT 166 10/13/2014    @LASTCHEM @  Radiographic Findings: Ct Chest W Contrast  09/30/2014   CLINICAL DATA:  Followup metastatic left lung non-small cell carcinoma. Chemotherapy and progress. Restaging.  EXAM: CT CHEST, ABDOMEN, AND PELVIS WITH CONTRAST  TECHNIQUE: Multidetector CT imaging of the chest, abdomen and pelvis was performed following the standard protocol during bolus administration of intravenous contrast.  CONTRAST:  153mL OMNIPAQUE IOHEXOL 300 MG/ML  SOLN  COMPARISON:  06/29/2014  FINDINGS: CT CHEST FINDINGS  Mediastinum/Hilar Regions: No masses or pathologically enlarged lymph nodes identified.  Other Thoracic Lymphadenopathy:  None.  Lungs:  No pulmonary  infiltrate or mass identified.  Pleura:  No evidence of effusion or mass.  Vascular/Cardiac:  No acute findings identified.  Other:  None.  Musculoskeletal: Small sclerotic metastases in the thoracic spine are stable.  CT ABDOMEN AND PELVIS FINDINGS  Hepatobiliary: No masses or other significant abnormality identified.  Pancreas: No mass, inflammatory changes, or other parenchymal abnormality identified.  Spleen:  Within normal limits in size and appearance.  Adrenal Glands:  No mass identified.  Kidneys/Urinary Tract: No masses identified. No evidence of hydronephrosis.  Stomach/Bowel/Peritoneum: No evidence of wall thickening, mass, or obstruction.  Vascular/Lymphatic: No pathologically enlarged lymph nodes identified. No other significant abnormality identified.  Reproductive:  No mass or other significant abnormality identified.  Other:  None.  Musculoskeletal: Sclerotic bone metastases involving the sacrum and pelvis remain stable.  IMPRESSION: Stable appearance of sclerotic bone metastases.  No evidence of soft tissue metastatic disease or other acute findings.   Electronically Signed   By: Earle Gell M.D.   On: 09/30/2014 14:57   Mr Jeri Cos ZJ Contrast  10/27/2014   CLINICAL DATA:  Metastatic left upper lobe lung cancer. Stereotactic radiosurgery to brain lesions in 07/2014. Current chemotherapy.  EXAM: MRI HEAD WITHOUT AND WITH CONTRAST  TECHNIQUE: Multiplanar, multiecho pulse sequences of the brain and surrounding structures were obtained without and with intravenous contrast.  CONTRAST:  46mL MULTIHANCE GADOBENATE DIMEGLUMINE 529 MG/ML IV SOLN  COMPARISON:  07/10/2014  FINDINGS: There is no evidence of acute infarct, intracranial hemorrhage, midline shift, or extra-axial fluid collection. There is mild generalized cerebral atrophy. Patchy T2 hyperintensities in the periventricular white matter are similar to the prior study and nonspecific but compatible with mild chronic small vessel ischemic disease.   6 mm nodule of enhancement in the high right frontal lobe on the prior study has slightly increased in size, now measuring 8 mm with enhancement now more ring-like in configuration (series 10, image 118). Two additional areas of more patchy cortical enhancement laterally in the high right frontal lobe have also slightly increased from the prior study, measuring up to 1.1 cm (series 10, image 114) and 8 mm (series 10, image 128) in extent. There is a new, small amount of white matter edema in the right frontal lobe adjacent to these lesions, likely secondary to interval radiation.  3 mm focus of cortical enhancement in the high left frontal lobe has not significantly changed (series 10, image 120 and series 11, image 22). No new enhancing lesions are identified.  Prior bilateral cataract extraction is noted. There is mild right sphenoid sinus mucosal thickening. Mastoid air cells are clear. Major intracranial vascular flow voids are preserved.  IMPRESSION: 1. Slightly increased size of  nodular and patchy enhancing lesions in the high right frontal lobe. The slight interval enlargement may reflect changes of interval radiation therapy. Attention on follow-up to exclude progression. Small amount of new edema in this region. 2. Unchanged 3 mm left frontal metastasis. 3. No new intracranial metastases identified.   Electronically Signed   By: Logan Bores   On: 10/27/2014 17:11   Ct Abdomen Pelvis W Contrast  09/30/2014   CLINICAL DATA:  Followup metastatic left lung non-small cell carcinoma. Chemotherapy and progress. Restaging.  EXAM: CT CHEST, ABDOMEN, AND PELVIS WITH CONTRAST  TECHNIQUE: Multidetector CT imaging of the chest, abdomen and pelvis was performed following the standard protocol during bolus administration of intravenous contrast.  CONTRAST:  167mL OMNIPAQUE IOHEXOL 300 MG/ML  SOLN  COMPARISON:  06/29/2014  FINDINGS: CT CHEST FINDINGS  Mediastinum/Hilar Regions: No masses or pathologically enlarged  lymph nodes identified.  Other Thoracic Lymphadenopathy:  None.  Lungs:  No pulmonary infiltrate or mass identified.  Pleura:  No evidence of effusion or mass.  Vascular/Cardiac:  No acute findings identified.  Other:  None.  Musculoskeletal: Small sclerotic metastases in the thoracic spine are stable.  CT ABDOMEN AND PELVIS FINDINGS  Hepatobiliary: No masses or other significant abnormality identified.  Pancreas: No mass, inflammatory changes, or other parenchymal abnormality identified.  Spleen:  Within normal limits in size and appearance.  Adrenal Glands:  No mass identified.  Kidneys/Urinary Tract: No masses identified. No evidence of hydronephrosis.  Stomach/Bowel/Peritoneum: No evidence of wall thickening, mass, or obstruction.  Vascular/Lymphatic: No pathologically enlarged lymph nodes identified. No other significant abnormality identified.  Reproductive:  No mass or other significant abnormality identified.  Other:  None.  Musculoskeletal: Sclerotic bone metastases involving the sacrum and pelvis remain stable.  IMPRESSION: Stable appearance of sclerotic bone metastases.  No evidence of soft tissue metastatic disease or other acute findings.   Electronically Signed   By: Earle Gell M.D.   On: 09/30/2014 14:57    Impression:  The patient is recovering from the effects of radiation.  MRI shows some increasing enhancement likely suggestive of acute radiation effect versus less likely tumor growth.  Plan:  MRI in 3 months to follow treated lesions and rule out new metastases then follow-up.  _____________________________________  Sheral Apley. Tammi Klippel, M.D.

## 2014-10-27 ENCOUNTER — Ambulatory Visit
Admission: RE | Admit: 2014-10-27 | Discharge: 2014-10-27 | Disposition: A | Discharge status: Home / Self Care | Payer: Medicare Other | Source: Ambulatory Visit | Attending: Radiation Oncology | Admitting: Radiation Oncology

## 2014-10-27 DIAGNOSIS — C7931 Secondary malignant neoplasm of brain: Secondary | ICD-10-CM

## 2014-10-27 MED ORDER — GADOBENATE DIMEGLUMINE 529 MG/ML IV SOLN: 11.0000 mL | Freq: Once | INTRAVENOUS | Status: DC | PRN

## 2014-10-27 MED ORDER — GADOBENATE DIMEGLUMINE 529 MG/ML IV SOLN
11.0000 mL | Freq: Once | INTRAVENOUS | Status: AC | PRN
Start: 2014-10-27 — End: 2014-10-27
  Administered 2014-10-27: 11 mL via INTRAVENOUS

## 2014-10-28 ENCOUNTER — Ambulatory Visit
Admission: RE | Admit: 2014-10-28 | Discharge: 2014-10-28 | Disposition: A | Discharge status: Home / Self Care | Payer: Medicare Other | Source: Ambulatory Visit | Attending: Radiation Oncology | Admitting: Radiation Oncology

## 2014-10-28 DIAGNOSIS — C7931 Secondary malignant neoplasm of brain: Secondary | ICD-10-CM

## 2014-10-28 NOTE — Addendum Note (Signed)
Encounter addended by: Lora Paula, MD on: 10/28/2014  4:11 PM<BR>     Documentation filed: Follow-up Section, LOS Section

## 2014-11-02 ENCOUNTER — Encounter: Payer: Self-pay | Admitting: Internal Medicine

## 2014-11-02 ENCOUNTER — Ambulatory Visit (HOSPITAL_BASED_OUTPATIENT_CLINIC_OR_DEPARTMENT_OTHER): Payer: Medicare Other | Admitting: Internal Medicine

## 2014-11-02 ENCOUNTER — Telehealth: Payer: Self-pay | Admitting: Internal Medicine

## 2014-11-02 ENCOUNTER — Telehealth: Payer: Self-pay | Admitting: *Deleted

## 2014-11-02 ENCOUNTER — Other Ambulatory Visit (HOSPITAL_BASED_OUTPATIENT_CLINIC_OR_DEPARTMENT_OTHER): Payer: Medicare Other

## 2014-11-02 ENCOUNTER — Ambulatory Visit (HOSPITAL_BASED_OUTPATIENT_CLINIC_OR_DEPARTMENT_OTHER): Payer: Medicare Other

## 2014-11-02 VITALS — BP 110/66 | HR 62 | Temp 98.1°F | Resp 19 | Ht 65.0 in | Wt 125.1 lb

## 2014-11-02 DIAGNOSIS — C3492 Malignant neoplasm of unspecified part of left bronchus or lung: Secondary | ICD-10-CM

## 2014-11-02 DIAGNOSIS — Z5111 Encounter for antineoplastic chemotherapy: Secondary | ICD-10-CM

## 2014-11-02 DIAGNOSIS — C3412 Malignant neoplasm of upper lobe, left bronchus or lung: Secondary | ICD-10-CM

## 2014-11-02 DIAGNOSIS — C3490 Malignant neoplasm of unspecified part of unspecified bronchus or lung: Secondary | ICD-10-CM

## 2014-11-02 DIAGNOSIS — C801 Malignant (primary) neoplasm, unspecified: Secondary | ICD-10-CM

## 2014-11-02 DIAGNOSIS — M545 Low back pain: Secondary | ICD-10-CM

## 2014-11-02 LAB — COMPREHENSIVE METABOLIC PANEL (CC13)
ALBUMIN: 3.6 g/dL (ref 3.5–5.0)
ALK PHOS: 58 U/L (ref 40–150)
ALT: 15 U/L (ref 0–55)
ANION GAP: 11 meq/L (ref 3–11)
AST: 21 U/L (ref 5–34)
BUN: 10.3 mg/dL (ref 7.0–26.0)
CO2: 27 meq/L (ref 22–29)
CREATININE: 1 mg/dL (ref 0.6–1.1)
Calcium: 9.3 mg/dL (ref 8.4–10.4)
Chloride: 102 mEq/L (ref 98–109)
EGFR: 55 mL/min/{1.73_m2} — ABNORMAL LOW (ref >90)
Glucose: 88 mg/dl (ref 70–140)
Potassium: 3.8 mEq/L (ref 3.5–5.1)
Sodium: 141 mEq/L (ref 136–145)
Total Bilirubin: 0.44 mg/dL (ref 0.20–1.20)
Total Protein: 6.4 g/dL (ref 6.4–8.3)

## 2014-11-02 LAB — CBC WITH DIFFERENTIAL/PLATELET
BASO%: 1.2 % (ref 0.0–2.0)
BASOS ABS: 0 10*3/uL (ref 0.0–0.1)
EOS%: 1.1 % (ref 0.0–7.0)
Eosinophils Absolute: 0 10*3/uL (ref 0.0–0.5)
HCT: 37.5 % (ref 34.8–46.6)
HGB: 12.3 g/dL (ref 11.6–15.9)
LYMPH#: 1.3 10*3/uL (ref 0.9–3.3)
LYMPH%: 33 % (ref 14.0–49.7)
MCH: 32.1 pg (ref 25.1–34.0)
MCHC: 32.8 g/dL (ref 31.5–36.0)
MCV: 97.7 fL (ref 79.5–101.0)
MONO#: 0.4 10*3/uL (ref 0.1–0.9)
MONO%: 10.1 % (ref 0.0–14.0)
NEUT#: 2.1 10*3/uL (ref 1.5–6.5)
NEUT%: 54.6 % (ref 38.4–76.8)
Platelets: 257 10*3/uL (ref 145–400)
RBC: 3.84 10*6/uL (ref 3.70–5.45)
RDW: 13 % (ref 11.2–14.5)
WBC: 3.9 10*3/uL (ref 3.9–10.3)

## 2014-11-02 MED ORDER — ONDANSETRON 8 MG/50ML IVPB (CHCC)
8.0000 mg | Freq: Once | INTRAVENOUS | Status: AC
  Administered 2014-11-02: 8 mg via INTRAVENOUS

## 2014-11-02 MED ORDER — DEXAMETHASONE SODIUM PHOSPHATE 10 MG/ML IJ SOLN
INTRAMUSCULAR | Status: AC
  Filled 2014-11-02: qty 1

## 2014-11-02 MED ORDER — ONDANSETRON 8 MG/NS 50 ML IVPB
INTRAVENOUS | Status: AC
  Filled 2014-11-02: qty 8

## 2014-11-02 MED ORDER — SODIUM CHLORIDE 0.9 % IV SOLN
500.0000 mg/m2 | Freq: Once | INTRAVENOUS | Status: AC
  Administered 2014-11-02: 875 mg via INTRAVENOUS
  Filled 2014-11-02: qty 35

## 2014-11-02 MED ORDER — SODIUM CHLORIDE 0.9 % IV SOLN
Freq: Once | INTRAVENOUS | Status: AC
  Administered 2014-11-02: 10:00:00 via INTRAVENOUS

## 2014-11-02 MED ORDER — DEXAMETHASONE SODIUM PHOSPHATE 10 MG/ML IJ SOLN
10.0000 mg | Freq: Once | INTRAMUSCULAR | Status: AC
  Administered 2014-11-02: 10 mg via INTRAVENOUS

## 2014-11-02 NOTE — Patient Instructions (Signed)
[facility] Discharge Instructions for Patients Receiving Chemotherapy  Today you received the following chemotherapy agents: Alimta  To help prevent nausea and vomiting after your treatment, we encourage you to take your nausea medication as prescribed by your physician.   If you develop nausea and vomiting that is not controlled by your nausea medication, call the clinic.   BELOW ARE SYMPTOMS THAT SHOULD BE REPORTED IMMEDIATELY:  *FEVER GREATER THAN 100.5 F  *CHILLS WITH OR WITHOUT FEVER  NAUSEA AND VOMITING THAT IS NOT CONTROLLED WITH YOUR NAUSEA MEDICATION  *UNUSUAL SHORTNESS OF BREATH  *UNUSUAL BRUISING OR BLEEDING  TENDERNESS IN MOUTH AND THROAT WITH OR WITHOUT PRESENCE OF ULCERS  *URINARY PROBLEMS  *BOWEL PROBLEMS  UNUSUAL RASH Items with * indicate a potential emergency and should be followed up as soon as possible.  Feel free to call the clinic you have any questions or concerns. The clinic phone number is [phone].

## 2014-11-02 NOTE — Telephone Encounter (Signed)
Pt confirmed labs/ov per 02/01 POF, gave pt AVS.... KJ, sent msg to add chemo

## 2014-11-02 NOTE — Telephone Encounter (Signed)
Per staff message and POF I have scheduled appts. Advised scheduler of appts. [name]  

## 2014-11-02 NOTE — Progress Notes (Signed)
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   PRIOR THERAPY:  1. Status post left upper lobectomy with mediastinal lymph node dissection under the care of Dr. Roxan Hockey on December 10, 2007.  2. Status post 4 cycles of adjuvant chemotherapy with cisplatin and Taxotere. Last dose was given 03/22/2008.  3. Status post 6 cycles of systemic chemotherapy with carboplatin and Alimta for metastatic disease in the bone. The last dose was given July 02, 2010, with stable disease.  4. Status post 12 cycles of maintenance Alimta at 500 mg/sq m given every 3 weeks. The last dose was given 05/08/2011. 5. Stereotactic radiotherapy to metastatic brain lesions under the care of Dr. Tammi Klippel completed on 07/30/2014.  CURRENT THERAPY:  1. Maintenance Alimta at 500 mg/sq m given every 4 weeks. The patient is status post 41 cycles.  2. Zometa 4 mg IV given every 2 months for bone metastasis.   CHEMOTHERAPY INTENT: Palliative  CURRENT # OF CHEMOTHERAPY CYCLES: 42 CURRENT ANTIEMETICS: none  CURRENT SMOKING STATUS: Never smoker   ORAL CHEMOTHERAPY AND CONSENT: n/a  CURRENT BISPHOSPHONATES USE: Yes, Zometa given every 2 months  PAIN MANAGEMENT: [facility]  NARCOTICS INDUCED CONSTIPATION: Occasional, uses MiraLax  LIVING WILL AND CODE STATUS: Has a living will   INTERVAL HISTORY: Tamara Ortiz 74 y.o. female returns to the clinic today for followup visit. The patient is feeling fine today with no specific complaints except intermittent low back pain which is better after she started treatment with Vicodin. She denied having any significant weight loss or night sweats. She has no  chest pain, shortness of breath, cough or hemoptysis. She denied having any significant nausea or vomiting. She has no fever or chills. She is tolerating her treatment with Alimta fairly well with no significant adverse effects. She is here today to start cycle #42 of her treatment.  MEDICAL HISTORY: Past Medical History  Diagnosis Date  . Hypertension   . HYPERLIPIDEMIA 11/05/2007  . ANXIETY 05/13/2009  . DEPRESSIVE DISORDER 05/06/2008  . HYPERTENSION 11/05/2007  . ALLERGIC RHINITIS 11/05/2007  . CONSTIPATION, CHRONIC 05/13/2009  . UTI 05/06/2008  . BACK PAIN 05/11/2008  . OSTEOPOROSIS 11/05/2007  . INSOMNIA-SLEEP DISORDER-UNSPEC 05/13/2009  . FATIGUE 05/13/2009  . Swelling, mass, or lump in chest 11/05/2007  . LUNG CANCER, HX OF 05/06/2008  . 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 Lung cancer   . Brain cancer     ALLERGIES:  is allergic to amoxicillin and levaquin.  MEDICATIONS:  Current Outpatient Prescriptions  Medication Sig Dispense Refill  . aspirin 81 MG EC tablet Take 81 mg by mouth daily.      . Calcium Carbonate-Vitamin D (CALCIUM-VITAMIN D) 500-200 MG-UNIT per tablet Take 1 tablet by mouth 2 (two) times daily.      . folic acid (FOLVITE) 1 MG tablet TAKE 1 TABLET BY MOUTH DAILY 90 tablet 0  . HYDROcodone-acetaminophen (NORCO) 5-325 MG per tablet Take 1 tablet by mouth every 6 (six) hours as needed for moderate pain. 30 tablet 0  . LORazepam (ATIVAN) 0.5 MG tablet Take 1 tablet (0.5 mg total) by mouth every 8 (  eight) hours as needed for anxiety. 40 tablet 1  . losartan-hydrochlorothiazide (HYZAAR) 50-12.5 MG per tablet   3  . Multiple Vitamin (ANTIOXIDANT A/C/E PO) Take by mouth daily.      . ondansetron (ZOFRAN) 8 MG tablet TAKE 1 TABLET BY MOUTH EVERY 8 HOURS AS NEEDED FOR FOR NAUSEA AND VOMITING 20 tablet 0  . polyethylene glycol powder (MIRALAX) powder Take 17 g by mouth daily.      . simvastatin (ZOCOR) 40 MG tablet Take 1 tablet (40 mg total) by mouth daily. 90  tablet 3  . zolendronic acid (ZOMETA) 4 MG/5ML injection Inject 4 mg into the vein once.      [name] zolpidem (AMBIEN CR) 12.5 MG CR tablet TAKE 1 TABLET BY MOUTH EVERY NIGHT AT BEDTIME AS NEEDED FOR SLEEP 90 tablet 1  . Alum & Mag Hydroxide-Simeth (MAGIC MOUTHWASH) SOLN Take 5 mLs by mouth 4 (four) times daily as needed for mouth pain. Swish and spit (Patient not taking: Reported on 11/02/2014) 240 mL 0  . ondansetron (ZOFRAN ODT) 8 MG disintegrating tablet Take 1 tablet (8 mg total) by mouth every 8 (eight) hours as needed for nausea or vomiting. (Patient not taking: Reported on 11/02/2014) 30 tablet 1   No current facility-administered medications for this visit.    SURGICAL HISTORY:  Past Surgical History  Procedure Laterality Date  . Tubal ligation    . S/p lul lung surgury  12/2007  . Lobectomy      REVIEW OF SYSTEMS:  Constitutional: negative Eyes: negative Ears, nose, mouth, throat, and face: negative Respiratory: negative Cardiovascular: negative Gastrointestinal: negative Genitourinary:negative Integument/breast: negative Hematologic/lymphatic: negative Musculoskeletal:positive for arthralgias and back pain Neurological: negative Behavioral/Psych: negative Endocrine: negative Allergic/Immunologic: negative   PHYSICAL EXAMINATION: General appearance: alert, cooperative and no distress Head: Normocephalic, without obvious abnormality, atraumatic Neck: no adenopathy Lymph nodes: Cervical, supraclavicular, and axillary nodes normal. Resp: clear to auscultation bilaterally Back: symmetric, no curvature. ROM normal. No CVA tenderness. Cardio: regular rate and rhythm, S1, S2 normal, no murmur, click, rub or gallop GI: soft, non-tender; bowel sounds normal; no masses,  no organomegaly Extremities: extremities normal, atraumatic, no cyanosis or edema Neurologic: Alert and oriented X 3, normal strength and tone. Normal symmetric reflexes. Normal coordination and gait  ECOG  PERFORMANCE STATUS: 1 - Symptomatic but completely ambulatory  Blood pressure 110/66, pulse 62, temperature 98.1 F (36.7 C), temperature source Oral, resp. rate 19, height 5\' 5"  (1.651 m), weight 125 lb 1.6 oz (56.745 kg), SpO2 99 %.  LABORATORY DATA: Lab Results  Component Value Date   WBC 3.9 11/02/2014   HGB 12.3 11/02/2014   HCT 37.5 11/02/2014   MCV 97.7 11/02/2014   PLT 257 11/02/2014      Chemistry      Component Value Date/Time   NA 141 11/02/2014 0843   NA 137 08/12/2014 1716   NA 140 04/17/2012 0855   K 3.8 11/02/2014 0843   K 3.7 08/12/2014 1716   K 4.1 04/17/2012 0855   CL 99 08/12/2014 1716   CL 100 03/04/2013 0901   CL 99 04/17/2012 0855   CO2 27 11/02/2014 0843   CO2 35* 08/12/2014 1716   CO2 29 04/17/2012 0855   BUN 10.3 11/02/2014 0843   BUN 16 08/12/2014 1716   BUN 15 04/17/2012 0855   CREATININE 1.0 11/02/2014 0843   CREATININE 1.1 08/12/2014 1716   CREATININE 1.4* 04/17/2012 0855      Component Value Date/Time   CALCIUM 9.3 11/02/2014  0843   CALCIUM 10.6* 08/12/2014 1716   CALCIUM 9.5 04/17/2012 0855   ALKPHOS 58 11/02/2014 0843   ALKPHOS 79 08/12/2014 1716   ALKPHOS 68 04/17/2012 0855   AST 21 11/02/2014 0843   AST 28 08/12/2014 1716   AST 29 04/17/2012 0855   ALT 15 11/02/2014 0843   ALT 24 08/12/2014 1716   ALT 31 04/17/2012 0855   BILITOT 0.44 11/02/2014 0843   BILITOT 0.4 08/12/2014 1716   BILITOT 0.60 04/17/2012 0855       RADIOGRAPHIC STUDIES: Mr Jeri Cos Wo Contrast  11-20-2014   CLINICAL DATA:  Metastatic left upper lobe lung cancer. Stereotactic radiosurgery to brain lesions in 07/2014. Current chemotherapy.  EXAM: MRI HEAD WITHOUT AND WITH CONTRAST  TECHNIQUE: Multiplanar, multiecho pulse sequences of the brain and surrounding structures were obtained without and with intravenous contrast.  CONTRAST:  15mL MULTIHANCE GADOBENATE DIMEGLUMINE 529 MG/ML IV SOLN  COMPARISON:  07/10/2014  FINDINGS: There is no evidence of acute  infarct, intracranial hemorrhage, midline shift, or extra-axial fluid collection. There is mild generalized cerebral atrophy. Patchy T2 hyperintensities in the periventricular white matter are similar to the prior study and nonspecific but compatible with mild chronic small vessel ischemic disease.  6 mm nodule of enhancement in the high right frontal lobe on the prior study has slightly increased in size, now measuring 8 mm with enhancement now more ring-like in configuration (series 10, image 118). Two additional areas of more patchy cortical enhancement laterally in the high right frontal lobe have also slightly increased from the prior study, measuring up to 1.1 cm (series 10, image 114) and 8 mm (series 10, image 128) in extent. There is a new, small amount of white matter edema in the right frontal lobe adjacent to these lesions, likely secondary to interval radiation.  3 mm focus of cortical enhancement in the high left frontal lobe has not significantly changed (series 10, image 120 and series 11, image 22). No new enhancing lesions are identified.  Prior bilateral cataract extraction is noted. There is mild right sphenoid sinus mucosal thickening. Mastoid air cells are clear. Major intracranial vascular flow voids are preserved.  IMPRESSION: 1. Slightly increased size of nodular and patchy enhancing lesions in the high right frontal lobe. The slight interval enlargement may reflect changes of interval radiation therapy. Attention on follow-up to exclude progression. Small amount of new edema in this region. 2. Unchanged 3 mm left frontal metastasis. 3. No new intracranial metastases identified.   Electronically Signed   By: Logan Bores   On: 11-20-2014 17:11    ASSESSMENT AND PLAN: This is a very pleasant 74 years old white female with recurrent non-small cell lung cancer, adenocarcinoma. The patient is currently on maintenance Alimta monthly status post total of 41 cycles.  The patient is feeling fine  and tolerating the treatment well. I recommended for the patient to continue her current treatment with single agent Alimta as scheduled. She would proceed with cycle #42 today as scheduled. For the low back pain, the patient will continue on Vicodin on as-needed basis She would come back for followup visit in one month for reevaluation with the next cycle of her chemotherapy. The patient was advised to call immediately if she has any concerning symptoms in the interval.  The patient voices understanding of current disease status and treatment options and is in agreement with the current care plan.  All questions were answered. The patient knows to call the clinic with any  problems, questions or concerns. We can certainly see the patient much sooner if necessary.  Disclaimer: This note was dictated with voice recognition software. Similar sounding words can inadvertently be transcribed and may not be corrected upon review.

## 2014-11-05 ENCOUNTER — Telehealth: Payer: Self-pay

## 2014-11-05 ENCOUNTER — Other Ambulatory Visit: Payer: Self-pay | Admitting: Medical Oncology

## 2014-11-05 ENCOUNTER — Encounter: Payer: Self-pay | Admitting: Internal Medicine

## 2014-11-05 ENCOUNTER — Other Ambulatory Visit: Payer: Self-pay | Admitting: *Deleted

## 2014-11-05 DIAGNOSIS — C3492 Malignant neoplasm of unspecified part of left bronchus or lung: Secondary | ICD-10-CM

## 2014-11-05 MED ORDER — HYDROCODONE-ACETAMINOPHEN 5-325 MG PO TABS: 1.0000 | ORAL_TABLET | Freq: Four times a day (QID) | ORAL | Status: DC | PRN

## 2014-11-05 NOTE — Telephone Encounter (Signed)
Initiated PA for Zolpidem ER 12.5 mg.  [facility] will contact our office and the patient once a decision has been made.  This request was by email per patient.

## 2014-11-05 NOTE — Progress Notes (Signed)
Refill done and locked up.

## 2014-11-05 NOTE — Telephone Encounter (Signed)
NORCO LAST REFILLED 09/22/15. PT.WOULD LIKE TO PICK UP THIS AFTERNOON.

## 2014-11-06 ENCOUNTER — Ambulatory Visit (HOSPITAL_BASED_OUTPATIENT_CLINIC_OR_DEPARTMENT_OTHER): Payer: Medicare Other | Admitting: Nurse Practitioner

## 2014-11-06 ENCOUNTER — Ambulatory Visit (HOSPITAL_BASED_OUTPATIENT_CLINIC_OR_DEPARTMENT_OTHER): Payer: Medicare Other

## 2014-11-06 ENCOUNTER — Telehealth: Payer: Self-pay

## 2014-11-06 VITALS — BP 116/67 | HR 61 | Temp 98.3°F | Wt 125.2 lb

## 2014-11-06 DIAGNOSIS — C3492 Malignant neoplasm of unspecified part of left bronchus or lung: Secondary | ICD-10-CM

## 2014-11-06 DIAGNOSIS — C7931 Secondary malignant neoplasm of brain: Secondary | ICD-10-CM

## 2014-11-06 DIAGNOSIS — E860 Dehydration: Secondary | ICD-10-CM

## 2014-11-06 DIAGNOSIS — R630 Anorexia: Secondary | ICD-10-CM

## 2014-11-06 DIAGNOSIS — C7951 Secondary malignant neoplasm of bone: Secondary | ICD-10-CM

## 2014-11-06 LAB — CBC WITH DIFFERENTIAL/PLATELET
BASO%: 0.4 % (ref 0.0–2.0)
Basophils Absolute: 0 10*3/uL (ref 0.0–0.1)
EOS%: 0.2 % (ref 0.0–7.0)
Eosinophils Absolute: 0 10*3/uL (ref 0.0–0.5)
HEMATOCRIT: 37.6 % (ref 34.8–46.6)
HGB: 12.7 g/dL (ref 11.6–15.9)
LYMPH%: 27.8 % (ref 14.0–49.7)
MCH: 32.7 pg (ref 25.1–34.0)
MCHC: 33.8 g/dL (ref 31.5–36.0)
MCV: 96.9 fL (ref 79.5–101.0)
MONO#: 0.1 10*3/uL (ref 0.1–0.9)
MONO%: 1.6 % (ref 0.0–14.0)
NEUT#: 3.6 10*3/uL (ref 1.5–6.5)
NEUT%: 70 % (ref 38.4–76.8)
Platelets: 231 10*3/uL (ref 145–400)
RBC: 3.88 10*6/uL (ref 3.70–5.45)
RDW: 12.6 % (ref 11.2–14.5)
WBC: 5.1 10*3/uL (ref 3.9–10.3)
lymph#: 1.4 10*3/uL (ref 0.9–3.3)

## 2014-11-06 LAB — COMPREHENSIVE METABOLIC PANEL (CC13)
ALT: 26 U/L (ref 0–55)
ANION GAP: 11 meq/L (ref 3–11)
AST: 33 U/L (ref 5–34)
Albumin: 3.9 g/dL (ref 3.5–5.0)
Alkaline Phosphatase: 70 U/L (ref 40–150)
BUN: 11.4 mg/dL (ref 7.0–26.0)
CO2: 28 meq/L (ref 22–29)
Calcium: 10 mg/dL (ref 8.4–10.4)
Chloride: 97 mEq/L — ABNORMAL LOW (ref 98–109)
Creatinine: 1 mg/dL (ref 0.6–1.1)
EGFR: 56 mL/min/{1.73_m2} — AB (ref >90)
GLUCOSE: 100 mg/dL (ref 70–140)
Potassium: 3.9 mEq/L (ref 3.5–5.1)
SODIUM: 137 meq/L (ref 136–145)
Total Bilirubin: 0.75 mg/dL (ref 0.20–1.20)
Total Protein: 6.8 g/dL (ref 6.4–8.3)

## 2014-11-06 MED ORDER — SODIUM CHLORIDE 0.9 % IV SOLN
Freq: Once | INTRAVENOUS | Status: AC
  Administered 2014-11-06: 15:00:00 via INTRAVENOUS

## 2014-11-06 MED ORDER — ONDANSETRON 8 MG/NS 50 ML IVPB
INTRAVENOUS | Status: AC
  Filled 2014-11-06: qty 8

## 2014-11-06 MED ORDER — ONDANSETRON 8 MG/50ML IVPB (CHCC)
8.0000 mg | Freq: Once | INTRAVENOUS | Status: AC
  Administered 2014-11-06: 8 mg via INTRAVENOUS

## 2014-11-06 NOTE — Telephone Encounter (Signed)
Pt had alimta on Monday. Pt is vomiting x2 today, no apetite. Constipation LBM 2 days ago. Chilling. Wishes to be seen today. POF sent for [facility]. Pt will get here approx 215pm

## 2014-11-06 NOTE — Patient Instructions (Signed)

## 2014-11-06 NOTE — Telephone Encounter (Signed)
Zolpidem ER 12.5 has been denied due to being a high risk medication and not recommended for patients over 72 years of age.  Advise on alternative.

## 2014-11-07 ENCOUNTER — Encounter: Payer: Self-pay | Admitting: Nurse Practitioner

## 2014-11-07 NOTE — Assessment & Plan Note (Signed)
Patient receives her Zometa infusions on an every two-month basis for her previously diagnosed bone metastasis.  Patient last received Zometa infusion on 09/01/2014.

## 2014-11-07 NOTE — Assessment & Plan Note (Signed)
Patient has had little appetite; and is taking in little oral intake as well.  She is complaining of chronic nausea and has vomited x 2 since her last chemo.  Patient feels dehydrated today.  Patient will receive 1 L normal saline IV fluid rehydration today.  Also advised patient that she may very well need to return to the [facility] later this week for additional IV fluid rehydration.

## 2014-11-07 NOTE — Assessment & Plan Note (Signed)
Patient is complaining of minimal appetite; and states she has had little oral intake.  Most likely this is secondary to chemotherapy.  Patient was encouraged to eat multiple small meals throughout the day if at all possible.

## 2014-11-07 NOTE — Assessment & Plan Note (Signed)
Patient received single agent Alimta chemotherapy on 2/12016.  She is scheduled to return for the next cycle  of the same regimen on 11/30/2014.

## 2014-11-07 NOTE — Progress Notes (Signed)
will   SYMPTOM MANAGEMENT CLINIC   HPI: Tamara Ortiz 74 y.o. female diagnosed with lung cancer; with metastasis to both the brain and bone.  Currently undergoing Alimta and Zometa therapy.  Pt presented to the [facility] today with c/o chronic nausea.  She states she has only vomited twice since her last chemo; but has had little oral intake and feels dehydrated. She denies any diarrhea or constipation.  She denies any fevers or chills.   HPI  CURRENT THERAPY: Upcoming Treatment Dates - LUNG Pemetrexed (Alimta) q21d  Days with orders from any treatment category:  11/08/2014      SCHEDULING COMMUNICATION      ondansetron (ZOFRAN) IVPB 8 mg      dexamethasone (DECADRON) injection 10 mg      PEMEtrexed (ALIMTA) 875 mg in sodium chloride 0.9 % 100 mL chemo infusion      sodium chloride 0.9 % injection 10 mL      heparin lock flush 100 unit/mL      heparin lock flush 100 unit/mL      alteplase (CATHFLO ACTIVASE) injection 2 mg      sodium chloride 0.9 % injection 3 mL      0.9 %  sodium chloride infusion      TREATMENT CONDITIONS 12/06/2014      SCHEDULING COMMUNICATION      ondansetron (ZOFRAN) IVPB 8 mg      dexamethasone (DECADRON) injection 10 mg      PEMEtrexed (ALIMTA) 875 mg in sodium chloride 0.9 % 100 mL chemo infusion      sodium chloride 0.9 % injection 10 mL      heparin lock flush 100 unit/mL      heparin lock flush 100 unit/mL      alteplase (CATHFLO ACTIVASE) injection 2 mg      sodium chloride 0.9 % injection 3 mL      0.9 %  sodium chloride infusion      TREATMENT CONDITIONS 01/03/2015      SCHEDULING COMMUNICATION      ondansetron (ZOFRAN) IVPB 8 mg      dexamethasone (DECADRON) injection 10 mg      PEMEtrexed (ALIMTA) 875 mg in sodium chloride 0.9 % 100 mL chemo infusion      sodium chloride 0.9 % injection 10 mL      heparin lock flush 100 unit/mL      heparin lock flush 100 unit/mL      alteplase (CATHFLO ACTIVASE) injection 2 mg      sodium chloride 0.9  % injection 3 mL      0.9 %  sodium chloride infusion      TREATMENT CONDITIONS    ROS  Past Medical History  Diagnosis Date  . Hypertension   . HYPERLIPIDEMIA 11/05/2007  . ANXIETY 05/13/2009  . DEPRESSIVE DISORDER 05/06/2008  . HYPERTENSION 11/05/2007  . ALLERGIC RHINITIS 11/05/2007  . CONSTIPATION, CHRONIC 05/13/2009  . UTI 05/06/2008  . BACK PAIN 05/11/2008  . OSTEOPOROSIS 11/05/2007  . INSOMNIA-SLEEP DISORDER-UNSPEC 05/13/2009  . FATIGUE 05/13/2009  . Swelling, mass, or lump in chest 11/05/2007  . LUNG CANCER, HX OF 05/06/2008  . 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 Lung cancer   . Brain cancer     Past Surgical History  Procedure Laterality Date  . Tubal ligation    . S/p lul lung surgury  12/2007  . Lobectomy      has Non-small  cell cancer of left lung; Hyperlipidemia; ANXIETY; Depression; Essential hypertension; ALLERGIC RHINITIS; CONSTIPATION, CHRONIC; Backache; OSTEOPOROSIS; Insomnia; Left knee pain; Preventative health care; Leg ulcer; Bone metastases; Diarrhea; Carpal tunnel syndrome, bilateral; Nausea with vomiting; Cancer; Dehydration; Anorexia; Weight loss; Brain metastasis; Neoplasm related pain; and Neutropenia on her problem list.     is allergic to amoxicillin and levaquin.    Medication List       This list is accurate as of: 11/06/14 11:59 PM.  Always use your most recent med list.               ANTIOXIDANT A/C/E PO  Take by mouth daily.     aspirin 81 MG EC tablet  Take 81 mg by mouth daily.     calcium-vitamin D 500-200 MG-UNIT per tablet  Take 1 tablet by mouth 2 (two) times daily.     folic acid 1 MG tablet  Commonly known as:  FOLVITE  TAKE 1 TABLET BY MOUTH DAILY     HYDROcodone-acetaminophen 5-325 MG per tablet  Commonly known as:  NORCO  Take 1 tablet by mouth every 6 (six) hours as needed for moderate pain.     LORazepam 0.5 MG tablet  Commonly known as:  ATIVAN  Take 1 tablet (0.5 mg total) by mouth every 8 (eight) hours  as needed for anxiety.     losartan-hydrochlorothiazide 50-12.5 MG per tablet  Commonly known as:  HYZAAR     magic mouthwash Soln  Take 5 mLs by mouth 4 (four) times daily as needed for mouth pain. Swish and spit     MIRALAX powder  Generic drug:  polyethylene glycol powder  Take 17 g by mouth daily.     ondansetron 8 MG disintegrating tablet  Commonly known as:  ZOFRAN ODT  Take 1 tablet (8 mg total) by mouth every 8 (eight) hours as needed for nausea or vomiting.     ondansetron 8 MG tablet  Commonly known as:  ZOFRAN  TAKE 1 TABLET BY MOUTH EVERY 8 HOURS AS NEEDED FOR FOR NAUSEA AND VOMITING     simvastatin 40 MG tablet  Commonly known as:  ZOCOR  Take 1 tablet (40 mg total) by mouth daily.     zolpidem 12.5 MG CR tablet  Commonly known as:  AMBIEN CR  TAKE 1 TABLET BY MOUTH EVERY NIGHT AT BEDTIME AS NEEDED FOR SLEEP     ZOMETA 4 MG/5ML injection  Generic drug:  zolendronic acid  Inject 4 mg into the vein once.         PHYSICAL EXAMINATION  Blood pressure 116/67, pulse 61, temperature 98.3 F (36.8 C), temperature source Oral, weight 125 lb 3.2 oz (56.79 kg), SpO2 100 %.  Physical Exam  Constitutional: She is oriented to person, place, and time. Vital signs are normal. She appears unhealthy.  HENT:  Head: Normocephalic and atraumatic.  Mouth/Throat: Oropharynx is clear and moist.  Eyes: Conjunctivae and EOM are normal. Pupils are equal, round, and reactive to light. Right eye exhibits no discharge. Left eye exhibits no discharge. No scleral icterus.  Neck: Normal range of motion. Neck supple. No JVD present. No tracheal deviation present. No thyromegaly present.  Cardiovascular: Normal rate, regular rhythm, normal heart sounds and intact distal pulses.   Pulmonary/Chest: Effort normal and breath sounds normal. No respiratory distress.  Abdominal: Soft. Bowel sounds are normal. She exhibits no distension and no mass. There is no tenderness. There is no rebound and  no guarding.  Musculoskeletal: Normal  range of motion. She exhibits tenderness. She exhibits no edema.  Mild tenderness to the mid and lower back with palpation.  Patient observed with full range of motion and ambulating with no assistance.  Lymphadenopathy:    She has no cervical adenopathy.  Neurological: She is alert and oriented to person, place, and time. Gait normal.  Skin: Skin is warm and dry. No rash noted. No erythema.  Psychiatric: Affect normal.  Nursing note and vitals reviewed.   LABORATORY DATA:. Appointment on 11/06/2014  Component Date Value Ref Range Status  . WBC 11/06/2014 5.1  3.9 - 10.3 10e3/uL Final  . NEUT# 11/06/2014 3.6  1.5 - 6.5 10e3/uL Final  . HGB 11/06/2014 12.7  11.6 - 15.9 g/dL Final  . HCT 11/06/2014 37.6  34.8 - 46.6 % Final  . Platelets 11/06/2014 231  145 - 400 10e3/uL Final  . MCV 11/06/2014 96.9  79.5 - 101.0 fL Final  . MCH 11/06/2014 32.7  25.1 - 34.0 pg Final  . MCHC 11/06/2014 33.8  31.5 - 36.0 g/dL Final  . RBC 11/06/2014 3.88  3.70 - 5.45 10e6/uL Final  . RDW 11/06/2014 12.6  11.2 - 14.5 % Final  . lymph# 11/06/2014 1.4  0.9 - 3.3 10e3/uL Final  . MONO# 11/06/2014 0.1  0.1 - 0.9 10e3/uL Final  . Eosinophils Absolute 11/06/2014 0.0  0.0 - 0.5 10e3/uL Final  . Basophils Absolute 11/06/2014 0.0  0.0 - 0.1 10e3/uL Final  . NEUT% 11/06/2014 70.0  38.4 - 76.8 % Final  . LYMPH% 11/06/2014 27.8  14.0 - 49.7 % Final  . MONO% 11/06/2014 1.6  0.0 - 14.0 % Final  . EOS% 11/06/2014 0.2  0.0 - 7.0 % Final  . BASO% 11/06/2014 0.4  0.0 - 2.0 % Final  . Sodium 11/06/2014 137  136 - 145 mEq/L Final  . Potassium 11/06/2014 3.9  3.5 - 5.1 mEq/L Final  . Chloride 11/06/2014 97* 98 - 109 mEq/L Final  . CO2 11/06/2014 28  22 - 29 mEq/L Final  . Glucose 11/06/2014 100  70 - 140 mg/dl Final  . BUN 11/06/2014 11.4  7.0 - 26.0 mg/dL Final  . Creatinine 11/06/2014 1.0  0.6 - 1.1 mg/dL Final  . Total Bilirubin 11/06/2014 0.75  0.20 - 1.20 mg/dL Final  .  Alkaline Phosphatase 11/06/2014 70  40 - 150 U/L Final  . AST 11/06/2014 33  5 - 34 U/L Final  . ALT 11/06/2014 26  0 - 55 U/L Final  . Total Protein 11/06/2014 6.8  6.4 - 8.3 g/dL Final  . Albumin 11/06/2014 3.9  3.5 - 5.0 g/dL Final  . Calcium 11/06/2014 10.0  8.4 - 10.4 mg/dL Final  . Anion Gap 11/06/2014 11  3 - 11 mEq/L Final  . EGFR 11/06/2014 56* >90 ml/min/1.73 m2 Final   eGFR is calculated using the CKD-EPI Creatinine Equation (2009)     RADIOGRAPHIC STUDIES: No results found.  ASSESSMENT/PLAN:    Anorexia Patient is complaining of minimal appetite; and states she has had little oral intake.  Most likely this is secondary to chemotherapy.  Patient was encouraged to eat multiple small meals throughout the day if at all possible.     Bone metastases Patient receives her Zometa infusions on an every two-month basis for her previously diagnosed bone metastasis.  Patient last received Zometa infusion on 09/01/2014.     Dehydration Patient has had little appetite; and is taking in little oral intake as well.  She is complaining  of chronic nausea and has vomited x 2 since her last chemo.  Patient feels dehydrated today.  Patient will receive 1 L normal saline IV fluid rehydration today.  Also advised patient that she may very well need to return to the [facility] later this week for additional IV fluid rehydration.     Non-small cell cancer of left lung Patient received single agent Alimta chemotherapy on 2/12016.  She is scheduled to return for the next cycle  of the same regimen on 11/30/2014.      Patient stated understanding of all instructions; and was in agreement with this plan of care. The patient knows to call the clinic with any problems, questions or concerns.   Review/collaboration with Dr. Julien Nordmann regarding all aspects of patient's visit today.   Total time spent with patient was 25 minutes;  with greater than 75 percent of that time spent in face to  face counseling regarding her symptoms, and coordination of care and follow up.  Disclaimer: This note was dictated with voice recognition software. Similar sounding words can inadvertently be transcribed and may not be corrected upon review.   Drue Second, NP 11/07/2014

## 2014-11-10 MED ORDER — ZOLPIDEM TARTRATE ER 6.25 MG PO TBCR: 6.2500 mg | EXTENDED_RELEASE_TABLET | Freq: Every evening | ORAL | Status: DC | PRN

## 2014-11-10 NOTE — Addendum Note (Signed)
Addended by: Biagio Borg on: 11/10/2014 08:38 PM   Modules accepted: Orders, Medications

## 2014-11-10 NOTE — Telephone Encounter (Signed)
Ok to try HALF  - ambien CR 6.25 mg - new rx Done hardcopy to cindy

## 2014-11-11 ENCOUNTER — Telehealth: Payer: Self-pay | Admitting: *Deleted

## 2014-11-11 ENCOUNTER — Other Ambulatory Visit: Payer: Self-pay | Admitting: Internal Medicine

## 2014-11-11 ENCOUNTER — Encounter: Payer: Self-pay | Admitting: Internal Medicine

## 2014-11-11 ENCOUNTER — Other Ambulatory Visit: Payer: Self-pay | Admitting: Radiation Therapy

## 2014-11-11 DIAGNOSIS — C3492 Malignant neoplasm of unspecified part of left bronchus or lung: Secondary | ICD-10-CM

## 2014-11-11 DIAGNOSIS — C7931 Secondary malignant neoplasm of brain: Secondary | ICD-10-CM

## 2014-11-11 MED ORDER — OLANZAPINE 10 MG PO TABS
10.0000 mg | ORAL_TABLET | Freq: Every day | ORAL | Status: DC
Start: 2014-11-11 — End: 2014-11-11

## 2014-11-11 NOTE — Telephone Encounter (Signed)
Discussed with Dr Julien Nordmann about Mychart message r/t having persistant n/v.  Per Dr Vista Mink, okay to start pt on zyprexa.  Informed patient.  She verbalized understanding.  Per Dr Vista Mink, okay for her to continue to take hydrocodone at 9pm and to hold the ambien to see if the zyprexa helps her sleep.  If not she can continue to use ambien along with zyprexa.

## 2014-11-11 NOTE — Telephone Encounter (Signed)
rx faxed to [facility]

## 2014-11-12 ENCOUNTER — Ambulatory Visit (HOSPITAL_BASED_OUTPATIENT_CLINIC_OR_DEPARTMENT_OTHER): Payer: Medicare Other

## 2014-11-12 DIAGNOSIS — C3492 Malignant neoplasm of unspecified part of left bronchus or lung: Secondary | ICD-10-CM

## 2014-11-12 DIAGNOSIS — R112 Nausea with vomiting, unspecified: Secondary | ICD-10-CM

## 2014-11-12 LAB — FECAL OCCULT BLOOD, GUAIAC: Occult Blood: NEGATIVE

## 2014-11-17 ENCOUNTER — Telehealth: Payer: Self-pay | Admitting: Medical Oncology

## 2014-11-17 ENCOUNTER — Ambulatory Visit (HOSPITAL_COMMUNITY)
Admission: RE | Admit: 2014-11-17 | Discharge: 2014-11-17 | Disposition: A | Discharge status: Home / Self Care | Payer: Medicare Other | Source: Ambulatory Visit | Attending: Internal Medicine | Admitting: Internal Medicine

## 2014-11-17 DIAGNOSIS — Z1231 Encounter for screening mammogram for malignant neoplasm of breast: Secondary | ICD-10-CM | POA: Diagnosis present

## 2014-11-17 NOTE — Telephone Encounter (Signed)
Follow up from nausea last week . Pt states "I am doing better" . She asked for stool sample results and I gave them to her.

## 2014-11-18 ENCOUNTER — Other Ambulatory Visit: Payer: Self-pay | Admitting: Internal Medicine

## 2014-11-18 DIAGNOSIS — R928 Other abnormal and inconclusive findings on diagnostic imaging of breast: Secondary | ICD-10-CM

## 2014-11-24 NOTE — Telephone Encounter (Signed)
Insurance Co sent patient another letter stating she need Prior Auth for meds Ambien Phone # [phone]

## 2014-11-25 ENCOUNTER — Encounter: Payer: Self-pay | Admitting: Internal Medicine

## 2014-11-25 ENCOUNTER — Ambulatory Visit
Admission: RE | Admit: 2014-11-25 | Discharge: 2014-11-25 | Disposition: A | Discharge status: Home / Self Care | Payer: Medicare Other | Source: Ambulatory Visit | Attending: Internal Medicine | Admitting: Internal Medicine

## 2014-11-25 DIAGNOSIS — R928 Other abnormal and inconclusive findings on diagnostic imaging of breast: Secondary | ICD-10-CM

## 2014-11-30 ENCOUNTER — Ambulatory Visit (HOSPITAL_BASED_OUTPATIENT_CLINIC_OR_DEPARTMENT_OTHER): Payer: Medicare Other | Admitting: Physician Assistant

## 2014-11-30 ENCOUNTER — Telehealth: Payer: Self-pay | Admitting: Internal Medicine

## 2014-11-30 ENCOUNTER — Other Ambulatory Visit (HOSPITAL_BASED_OUTPATIENT_CLINIC_OR_DEPARTMENT_OTHER): Payer: Medicare Other

## 2014-11-30 ENCOUNTER — Encounter: Payer: Self-pay | Admitting: Physician Assistant

## 2014-11-30 ENCOUNTER — Ambulatory Visit (HOSPITAL_BASED_OUTPATIENT_CLINIC_OR_DEPARTMENT_OTHER): Payer: Medicare Other

## 2014-11-30 VITALS — BP 126/65 | HR 56 | Temp 98.2°F | Resp 18 | Ht 65.0 in | Wt 125.8 lb

## 2014-11-30 DIAGNOSIS — Z5111 Encounter for antineoplastic chemotherapy: Secondary | ICD-10-CM

## 2014-11-30 DIAGNOSIS — C7951 Secondary malignant neoplasm of bone: Secondary | ICD-10-CM

## 2014-11-30 DIAGNOSIS — C7931 Secondary malignant neoplasm of brain: Secondary | ICD-10-CM

## 2014-11-30 DIAGNOSIS — C3490 Malignant neoplasm of unspecified part of unspecified bronchus or lung: Secondary | ICD-10-CM

## 2014-11-30 DIAGNOSIS — C3492 Malignant neoplasm of unspecified part of left bronchus or lung: Secondary | ICD-10-CM

## 2014-11-30 DIAGNOSIS — C3412 Malignant neoplasm of upper lobe, left bronchus or lung: Secondary | ICD-10-CM

## 2014-11-30 DIAGNOSIS — M545 Low back pain: Secondary | ICD-10-CM

## 2014-11-30 DIAGNOSIS — R110 Nausea: Secondary | ICD-10-CM

## 2014-11-30 DIAGNOSIS — C801 Malignant (primary) neoplasm, unspecified: Secondary | ICD-10-CM

## 2014-11-30 LAB — CBC WITH DIFFERENTIAL/PLATELET
BASO%: 0.7 % (ref 0.0–2.0)
BASOS ABS: 0 10*3/uL (ref 0.0–0.1)
EOS%: 0.7 % (ref 0.0–7.0)
Eosinophils Absolute: 0 10*3/uL (ref 0.0–0.5)
HCT: 39.3 % (ref 34.8–46.6)
HGB: 13.2 g/dL (ref 11.6–15.9)
LYMPH%: 30.3 % (ref 14.0–49.7)
MCH: 32.8 pg (ref 25.1–34.0)
MCHC: 33.6 g/dL (ref 31.5–36.0)
MCV: 97.8 fL (ref 79.5–101.0)
MONO#: 0.5 10*3/uL (ref 0.1–0.9)
MONO%: 9.8 % (ref 0.0–14.0)
NEUT#: 2.7 10*3/uL (ref 1.5–6.5)
NEUT%: 58.5 % (ref 38.4–76.8)
Platelets: 230 10*3/uL (ref 145–400)
RBC: 4.02 10*6/uL (ref 3.70–5.45)
RDW: 13 % (ref 11.2–14.5)
WBC: 4.6 10*3/uL (ref 3.9–10.3)
lymph#: 1.4 10*3/uL (ref 0.9–3.3)

## 2014-11-30 LAB — COMPREHENSIVE METABOLIC PANEL (CC13)
ALK PHOS: 61 U/L (ref 40–150)
ALT: 12 U/L (ref 0–55)
ANION GAP: 9 meq/L (ref 3–11)
AST: 20 U/L (ref 5–34)
Albumin: 3.7 g/dL (ref 3.5–5.0)
BILIRUBIN TOTAL: 0.42 mg/dL (ref 0.20–1.20)
BUN: 9.9 mg/dL (ref 7.0–26.0)
CO2: 26 meq/L (ref 22–29)
Calcium: 9.4 mg/dL (ref 8.4–10.4)
Chloride: 102 mEq/L (ref 98–109)
Creatinine: 0.9 mg/dL (ref 0.6–1.1)
EGFR: 63 mL/min/{1.73_m2} — AB (ref >90)
GLUCOSE: 84 mg/dL (ref 70–140)
Potassium: 3.7 mEq/L (ref 3.5–5.1)
SODIUM: 138 meq/L (ref 136–145)
TOTAL PROTEIN: 6.5 g/dL (ref 6.4–8.3)

## 2014-11-30 MED ORDER — DEXAMETHASONE SODIUM PHOSPHATE 10 MG/ML IJ SOLN
INTRAMUSCULAR | Status: AC
  Filled 2014-11-30: qty 1

## 2014-11-30 MED ORDER — ONDANSETRON 8 MG/NS 50 ML IVPB
INTRAVENOUS | Status: AC
  Filled 2014-11-30: qty 8

## 2014-11-30 MED ORDER — SODIUM CHLORIDE 0.9 % IV SOLN
500.0000 mg/m2 | Freq: Once | INTRAVENOUS | Status: AC
  Administered 2014-11-30: 800 mg via INTRAVENOUS
  Filled 2014-11-30: qty 32

## 2014-11-30 MED ORDER — DEXAMETHASONE SODIUM PHOSPHATE 10 MG/ML IJ SOLN
10.0000 mg | Freq: Once | INTRAMUSCULAR | Status: AC
  Administered 2014-11-30: 10 mg via INTRAVENOUS

## 2014-11-30 MED ORDER — ONDANSETRON 8 MG/50ML IVPB (CHCC)
8.0000 mg | Freq: Once | INTRAVENOUS | Status: AC
  Administered 2014-11-30: 8 mg via INTRAVENOUS

## 2014-11-30 MED ORDER — SODIUM CHLORIDE 0.9 % IV SOLN
Freq: Once | INTRAVENOUS | Status: AC
  Administered 2014-11-30: 11:00:00 via INTRAVENOUS

## 2014-11-30 NOTE — Telephone Encounter (Signed)
gv and printed appt sched adn avs for pt for March....gv pt barium

## 2014-11-30 NOTE — Patient Instructions (Signed)
[facility] Discharge Instructions for Patients Receiving Chemotherapy  Today you received the following chemotherapy agents: Alimta.  To help prevent nausea and vomiting after your treatment, we encourage you to take your nausea medication: Zofran 8 mg every 8 hours as needed.   If you develop nausea and vomiting that is not controlled by your nausea medication, call the clinic.   BELOW ARE SYMPTOMS THAT SHOULD BE REPORTED IMMEDIATELY:  *FEVER GREATER THAN 100.5 F  *CHILLS WITH OR WITHOUT FEVER  NAUSEA AND VOMITING THAT IS NOT CONTROLLED WITH YOUR NAUSEA MEDICATION  *UNUSUAL SHORTNESS OF BREATH  *UNUSUAL BRUISING OR BLEEDING  TENDERNESS IN MOUTH AND THROAT WITH OR WITHOUT PRESENCE OF ULCERS  *URINARY PROBLEMS  *BOWEL PROBLEMS  UNUSUAL RASH Items with * indicate a potential emergency and should be followed up as soon as possible.  Feel free to call the clinic you have any questions or concerns. The clinic phone number is [phone].

## 2014-11-30 NOTE — Progress Notes (Addendum)
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   PRIOR THERAPY:  1. Status post left upper lobectomy with mediastinal lymph node dissection under the care of Dr. Roxan Hockey on December 10, 2007.  2. Status post 4 cycles of adjuvant chemotherapy with cisplatin and Taxotere. Last dose was given 03/22/2008.  3. Status post 6 cycles of systemic chemotherapy with carboplatin and Alimta for metastatic disease in the bone. The last dose was given July 02, 2010, with stable disease.  4. Status post 12 cycles of maintenance Alimta at 500 mg/sq m given every 3 weeks. The last dose was given 05/08/2011. 5. Stereotactic radiotherapy to metastatic brain lesions under the care of Dr. Tammi Klippel completed on 07/30/2014.  CURRENT THERAPY:  1. Maintenance Alimta at 500 mg/sq m given every 4 weeks. The patient is status post 41 cycles.  2. Zometa 4 mg IV given every 2 months for bone metastasis.   CHEMOTHERAPY INTENT: Palliative  CURRENT # OF CHEMOTHERAPY CYCLES: 42 CURRENT ANTIEMETICS: none  CURRENT SMOKING STATUS: Never smoker   ORAL CHEMOTHERAPY AND CONSENT: n/a  CURRENT BISPHOSPHONATES USE: Yes, Zometa given every 2 months  PAIN MANAGEMENT: [employer]  NARCOTICS INDUCED CONSTIPATION: Occasional, uses MiraLax  LIVING WILL AND CODE STATUS: Has a living will   INTERVAL HISTORY: Tamara Ortiz 74 y.o. female returns to the clinic today for followup visit. The patient is feeling fine today with no specific complaints except intermittent low back pain which is better after she started treatment with Vicodin. She denied having any significant weight loss or night sweats. She has no  chest pain, shortness of breath, cough or hemoptysis. She denied having any significant nausea or vomiting. She has no fever or chills. She is tolerating her treatment with Alimta fairly well with no significant adverse effects. She is here today to start cycle #42 of her treatment.  MEDICAL HISTORY: Past Medical History  Diagnosis Date  . Hypertension   . HYPERLIPIDEMIA 11/05/2007  . ANXIETY 05/13/2009  . DEPRESSIVE DISORDER 05/06/2008  . HYPERTENSION 11/05/2007  . ALLERGIC RHINITIS 11/05/2007  . CONSTIPATION, CHRONIC 05/13/2009  . UTI 05/06/2008  . BACK PAIN 05/11/2008  . OSTEOPOROSIS 11/05/2007  . INSOMNIA-SLEEP DISORDER-UNSPEC 05/13/2009  . FATIGUE 05/13/2009  . Swelling, mass, or lump in chest 11/05/2007  . LUNG CANCER, HX OF 05/06/2008  . 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 Lung cancer   . Brain cancer     ALLERGIES:  is allergic to amoxicillin and levaquin.  MEDICATIONS:  Current Outpatient Prescriptions  Medication Sig Dispense Refill  . Alum & Mag Hydroxide-Simeth (MAGIC MOUTHWASH) SOLN Take 5 mLs by mouth 4 (four) times daily as needed for mouth pain. Swish and spit 240 mL 0  . aspirin 81 MG EC tablet Take 81 mg by mouth daily.      . Calcium Carbonate-Vitamin D (CALCIUM-VITAMIN D) 500-200 MG-UNIT per tablet Take 1 tablet by mouth 2 (two) times daily.      . folic acid (FOLVITE) 1 MG tablet TAKE 1 TABLET BY MOUTH DAILY 90 tablet 0  . HYDROcodone-acetaminophen (NORCO) 5-325 MG per tablet Take 1 tablet by mouth  every 6 (six) hours as needed for moderate pain. 30 tablet 0  . losartan-hydrochlorothiazide (HYZAAR) 50-12.5 MG per tablet   3  . Multiple Vitamin (ANTIOXIDANT A/C/E PO) Take by mouth daily.      [name] OLANZapine (ZYPREXA) 10 MG tablet TAKE 1 TABLET BY MOUTH EVERY NIGHT AT BEDTIME 90 tablet 0  . ondansetron (ZOFRAN ODT) 8 MG disintegrating tablet Take 1 tablet (8 mg total) by mouth every 8 (eight) hours as needed for nausea or vomiting. 30 tablet 1  . ondansetron  (ZOFRAN) 8 MG tablet TAKE 1 TABLET BY MOUTH EVERY 8 HOURS AS NEEDED FOR FOR NAUSEA AND VOMITING 20 tablet 0  . polyethylene glycol powder (MIRALAX) powder Take 17 g by mouth daily.      . simvastatin (ZOCOR) 40 MG tablet Take 1 tablet (40 mg total) by mouth daily. 90 tablet 3  . zolendronic acid (ZOMETA) 4 MG/5ML injection Inject 4 mg into the vein once.      [name] zolpidem (AMBIEN CR) 6.25 MG CR tablet Take 1 tablet (6.25 mg total) by mouth at bedtime as needed for sleep. 90 tablet 1  . LORazepam (ATIVAN) 0.5 MG tablet Take 1 tablet (0.5 mg total) by mouth every 8 (eight) hours as needed for anxiety. (Patient not taking: Reported on 11/30/2014) 40 tablet 1   No current facility-administered medications for this visit.    SURGICAL HISTORY:  Past Surgical History  Procedure Laterality Date  . Tubal ligation    . S/p lul lung surgury  12/2007  . Lobectomy      REVIEW OF SYSTEMS:  Constitutional: negative Eyes: negative Ears, nose, mouth, throat, and face: negative Respiratory: negative Cardiovascular: negative Gastrointestinal: positive for nausea and vomiting Genitourinary:negative Integument/breast: negative Hematologic/lymphatic: negative Musculoskeletal:positive for arthralgias and back pain Neurological: negative Behavioral/Psych: negative Endocrine: negative Allergic/Immunologic: negative   PHYSICAL EXAMINATION: General appearance: alert, cooperative and no distress Head: Normocephalic, without obvious abnormality, atraumatic Neck: no adenopathy Lymph nodes: Cervical, supraclavicular, and axillary nodes normal. Resp: clear to auscultation bilaterally Back: symmetric, no curvature. ROM normal. No CVA tenderness. Cardio: regular rate and rhythm, S1, S2 normal, no murmur, click, rub or gallop GI: soft, non-tender; bowel sounds normal; no masses,  no organomegaly Extremities: extremities normal, atraumatic, no cyanosis or edema Neurologic: Alert and oriented X 3, normal strength  and tone. Normal symmetric reflexes. Normal coordination and gait  ECOG PERFORMANCE STATUS: 1 - Symptomatic but completely ambulatory  Blood pressure 126/65, pulse 56, temperature 98.2 F (36.8 C), temperature source Oral, resp. rate 18, height '5\' 5"'  (1.651 m), weight 125 lb 12.8 oz (57.063 kg).  LABORATORY DATA: Lab Results  Component Value Date   WBC 4.6 11/30/2014   HGB 13.2 11/30/2014   HCT 39.3 11/30/2014   MCV 97.8 11/30/2014   PLT 230 11/30/2014      Chemistry      Component Value Date/Time   NA 138 11/30/2014 0930   NA 137 08/12/2014 1716   NA 140 04/17/2012 0855   K 3.7 11/30/2014 0930   K 3.7 08/12/2014 1716   K 4.1 04/17/2012 0855   CL 99 08/12/2014 1716   CL 100 03/04/2013 0901   CL 99 04/17/2012 0855   CO2 26 11/30/2014 0930   CO2 35* 08/12/2014 1716   CO2 29 04/17/2012 0855   BUN 9.9 11/30/2014 0930   BUN 16 08/12/2014 1716   BUN 15 04/17/2012 0855   CREATININE 0.9 11/30/2014 0930   CREATININE 1.1 08/12/2014 1716   CREATININE 1.4*  04/17/2012 0855      Component Value Date/Time   CALCIUM 9.4 11/30/2014 0930   CALCIUM 10.6* 08/12/2014 1716   CALCIUM 9.5 04/17/2012 0855   ALKPHOS 61 11/30/2014 0930   ALKPHOS 79 08/12/2014 1716   ALKPHOS 68 04/17/2012 0855   AST 20 11/30/2014 0930   AST 28 08/12/2014 1716   AST 29 04/17/2012 0855   ALT 12 11/30/2014 0930   ALT 24 08/12/2014 1716   ALT 31 04/17/2012 0855   BILITOT 0.42 11/30/2014 0930   BILITOT 0.4 08/12/2014 1716   BILITOT 0.60 04/17/2012 0855       RADIOGRAPHIC STUDIES: Mm Digital Diagnostic Unilat R  11/25/2014   CLINICAL DATA:  The patient returns after screening study for evaluation of possible asymmetry in the right breast. The patient is being treated for metastatic lung cancer.  EXAM: DIGITAL DIAGNOSTIC RIGHT MAMMOGRAM WITH CAD  ULTRASOUND RIGHT BREAST  COMPARISON:  11/17/2014 and earlier  ACR Breast Density Category d: The breast tissue is extremely dense, which lowers the sensitivity of  mammography.  FINDINGS: Additional views are performed, showing no persistent mass or distortion in the upper-outer quadrant of the right breast. Breast tissue is extremely dense and is further evaluated with ultrasound.  Mammographic images were processed with CAD.  On physical exam, I palpate no abnormality in the lateral quadrants of the right breast.  Targeted ultrasound is performed, showing normal appearing fibroglandular tissue throughout the lateral aspect of the right breast. No mass, distortion, or acoustic shadowing is demonstrated with ultrasound.  IMPRESSION: No mammographic or ultrasound evidence for malignancy.  RECOMMENDATION: Screening mammogram in one year.(Code:SM-B-01Y)  I have discussed the findings and recommendations with the patient. Results were also provided in writing at the conclusion of the visit. If applicable, a reminder letter will be sent to the patient regarding the next appointment.  BI-RADS CATEGORY  1: Negative.   Electronically Signed   By: Nolon Nations M.D.   On: 11/25/2014 15:36   US Breast Ltd Uni Right Inc Axilla  11/25/2014   CLINICAL DATA:  The patient returns after screening study for evaluation of possible asymmetry in the right breast. The patient is being treated for metastatic lung cancer.  EXAM: DIGITAL DIAGNOSTIC RIGHT MAMMOGRAM WITH CAD  ULTRASOUND RIGHT BREAST  COMPARISON:  11/17/2014 and earlier  ACR Breast Density Category d: The breast tissue is extremely dense, which lowers the sensitivity of mammography.  FINDINGS: Additional views are performed, showing no persistent mass or distortion in the upper-outer quadrant of the right breast. Breast tissue is extremely dense and is further evaluated with ultrasound.  Mammographic images were processed with CAD.  On physical exam, I palpate no abnormality in the lateral quadrants of the right breast.  Targeted ultrasound is performed, showing normal appearing fibroglandular tissue throughout the lateral aspect  of the right breast. No mass, distortion, or acoustic shadowing is demonstrated with ultrasound.  IMPRESSION: No mammographic or ultrasound evidence for malignancy.  RECOMMENDATION: Screening mammogram in one year.(Code:SM-B-01Y)  I have discussed the findings and recommendations with the patient. Results were also provided in writing at the conclusion of the visit. If applicable, a reminder letter will be sent to the patient regarding the next appointment.  BI-RADS CATEGORY  1: Negative.   Electronically Signed   By: Nolon Nations M.D.   On: 11/25/2014 15:36   Mm Screening Breast Tomo Bilateral  11/18/2014   CLINICAL DATA:  Screening.  EXAM: DIGITAL SCREENING BILATERAL MAMMOGRAM WITH 3D TOMO WITH CAD  COMPARISON:  Previous exam(s).  ACR Breast Density Category c: The breast tissue is heterogeneously dense, which may obscure small masses.  FINDINGS: In the right breast, a possible asymmetry warrants further evaluation with spot compression views and possibly ultrasound. In the left breast, no findings suspicious for malignancy. Images were processed with CAD.  IMPRESSION: Further evaluation is suggested for possible asymmetry in the right breast.  RECOMMENDATION: Diagnostic mammogram and possibly ultrasound of the right breast. (Code:[id number])  The patient will be contacted regarding the findings, and additional imaging will be scheduled.  BI-RADS CATEGORY  0: Incomplete. Need additional imaging evaluation and/or prior mammograms for comparison.   Electronically Signed   By: Everlean Alstrom M.D.   On: 11/18/2014 14:05    ASSESSMENT AND PLAN: This is a very pleasant 74 years old white female with recurrent non-small cell lung cancer, adenocarcinoma. The patient is currently on maintenance Alimta monthly status post total of 42 cycles.  The patient is feeling fine and tolerating the treatment well. Patient was discussed with and also seen by Dr. Julien Nordmann. She will proceed with cycle #43 today as scheduled.  Regarding her nausea medications patient is reluctant to start the Zyprexa. She is advised to alternate her Compazine and Zofran and may also take Ativan as needed for nausea. These measures are suboptimal for controlling her nausea and vomiting, we will revisit initiation of Zyprexa. She'll follow-up in one month with a restaging CT scan of the chest, abdomen and pelvis with contrast to reevaluate her disease prior to cycle #44.  The patient was advised to call immediately if she has any concerning symptoms in the interval.  The patient voices understanding of current disease status and treatment options and is in agreement with the current care plan.  All questions were answered. The patient knows to call the clinic with any problems, questions or concerns. We can certainly see the patient much sooner if necessary.  Carlton Adam, PA_C 11/30/2014  ADDENDUM: Hematology/Oncology Attending: I had a face to face encounter with the patient. I recommended her care plan. This is a very pleasant 74 years old white female with metastatic non-small cell lung cancer, adenocarcinoma with positive EGFR mutation exon 21. The patient is currently on treatment with maintenance Alimta status post 42 cycles and tolerating her treatment fairly well with no significant adverse effect except for occasional nausea. We recommended for the patient to continue her current antiemetics with Zofran, Compazine and occasional Ativan if needed. We will give her the option of treatment with Zyprexa but the patient declined.  She would proceed with cycle #43 today as scheduled. The patient would come back for follow-up visit in one month's for reevaluation after repeating CT scan of the chest, abdomen and pelvis for restaging of her disease. She was advised to call immediately if she has any concerning symptoms in the interval.  Disclaimer: This note was dictated with voice recognition software. Similar sounding words can  inadvertently be transcribed and may be missed upon review. Eilleen Kempf., MD 11/30/2014

## 2014-11-30 NOTE — Patient Instructions (Signed)
Take her Compazine, Zofran and Ativan as prescribed as needed for your nausea. Follow-up in one month with a restaging CT scan of your chest, abdomen and pelvis to reevaluate your disease

## 2014-12-02 ENCOUNTER — Encounter: Payer: Self-pay | Admitting: Internal Medicine

## 2014-12-02 NOTE — Telephone Encounter (Signed)
Pt called in [place] the My chart message.  [employer] is needing a PA on her ambein

## 2014-12-02 NOTE — Telephone Encounter (Signed)
I dont really have anything else to offer besides the PA that was already denied;  Unfortunately her medication insurance coverage is between her and the insurance, and I have no control over this  We can try something else if she is willing

## 2014-12-02 NOTE — Telephone Encounter (Signed)
Pt. Was upset about PA being denied. Wants to know if theres anything that she can do in order to get the PA approved. She really needs the medication in order to get a good nights rest.

## 2014-12-02 NOTE — Telephone Encounter (Signed)
Please see lou if some confusion about how to do this, I think this was started by robin and something has not worked out

## 2014-12-02 NOTE — Telephone Encounter (Signed)
cherina to see above  I think has to do with a PA for [employer]

## 2014-12-03 MED ORDER — TEMAZEPAM 15 MG PO CAPS: ORAL_CAPSULE | ORAL | Status: DC

## 2014-12-03 NOTE — Telephone Encounter (Signed)
Ok for trial temazepam - Done hardcopy to [employer]

## 2014-12-03 NOTE — Telephone Encounter (Signed)
Pt. Is willing to try a different medication. Wants to know if she'll need to come into the office for an appt in order to do so. If so, I can set up an appt. For her.

## 2014-12-03 NOTE — Telephone Encounter (Signed)
Pt. Agreed to try new medication. Rx done

## 2014-12-08 ENCOUNTER — Telehealth: Payer: Self-pay | Admitting: *Deleted

## 2014-12-08 DIAGNOSIS — C3492 Malignant neoplasm of unspecified part of left bronchus or lung: Secondary | ICD-10-CM

## 2014-12-08 MED ORDER — HYDROCODONE-ACETAMINOPHEN 5-325 MG PO TABS: 1.0000 | ORAL_TABLET | Freq: Four times a day (QID) | ORAL | Status: DC | PRN

## 2014-12-08 NOTE — Telephone Encounter (Signed)
PRESCRIPTION TO DR.MOHAMED.

## 2014-12-08 NOTE — Telephone Encounter (Signed)
CALLED PT. PRESCRIPTION IS READY.

## 2014-12-16 ENCOUNTER — Telehealth: Payer: Self-pay | Admitting: *Deleted

## 2014-12-16 MED ORDER — TRAZODONE HCL 50 MG PO TABS: 25.0000 mg | ORAL_TABLET | Freq: Every evening | ORAL | Status: DC | PRN

## 2014-12-16 NOTE — Telephone Encounter (Signed)
Left msg on triage stating the temazepam is not going to work for her. After taking med she stayed up 2 hours later. Then this am she couldn't get up miss her exercise class, felt groggy. Pt states Lorrin Mais only works for her but [employer] will not cover. Wanting md recommendation...Johny Chess

## 2014-12-16 NOTE — Telephone Encounter (Signed)
Since I have no control over her insurance coverage or not coverage of Tamara Ortiz (which is between her and the insurance company), the best I can do is offer trazodone 50 qhs prn - done erx

## 2014-12-17 NOTE — Telephone Encounter (Signed)
Called pt no answer LMOM with md response.../lmb 

## 2014-12-21 ENCOUNTER — Telehealth: Payer: Self-pay | Admitting: *Deleted

## 2014-12-21 ENCOUNTER — Telehealth: Payer: Self-pay | Admitting: Internal Medicine

## 2014-12-21 NOTE — Telephone Encounter (Signed)
States has started an appeal with insurance company for [employer].  States Tamara Ortiz is the only med that works for her.

## 2014-12-21 NOTE — Telephone Encounter (Signed)
Patient called reporting having tried multiple sleep medications without success.  Encouraged her to call Dr. Gwynn Burly office regarding this matter since he has prescribed those.  She agreed to do so.

## 2014-12-24 ENCOUNTER — Encounter: Payer: Self-pay | Admitting: Internal Medicine

## 2014-12-24 ENCOUNTER — Ambulatory Visit (INDEPENDENT_AMBULATORY_CARE_PROVIDER_SITE_OTHER): Payer: Medicare Other | Admitting: Internal Medicine

## 2014-12-24 VITALS — BP 126/80 | HR 69 | Temp 98.0°F | Resp 18 | Ht 65.0 in | Wt 129.0 lb

## 2014-12-24 DIAGNOSIS — I10 Essential (primary) hypertension: Secondary | ICD-10-CM | POA: Diagnosis not present

## 2014-12-24 DIAGNOSIS — G4700 Insomnia, unspecified: Secondary | ICD-10-CM | POA: Diagnosis not present

## 2014-12-24 MED ORDER — SUVOREXANT 15 MG PO TABS: 15.0000 mg | ORAL_TABLET | Freq: Every evening | ORAL | Status: DC | PRN

## 2014-12-24 NOTE — Progress Notes (Signed)
Pre visit review using our clinic review tool, if applicable. No additional management support is needed unless otherwise documented below in the visit note. 

## 2014-12-24 NOTE — Patient Instructions (Signed)
Please take all new medication as prescribed  - the belsomra  Please continue all other medications as before, and refills have been done if requested.  Please have the pharmacy call with any other refills you may need.  Please keep your appointments with your specialists as you may have planned  We will try to handle the appeal if the insurance company contacts [place]

## 2014-12-24 NOTE — Progress Notes (Signed)
Subjective:    Patient ID: Tamara Ortiz, female    DOB: [birth year], 74 y.o.   MRN: [id number]  HPI  Here with insomnia, ambien no longer covered by insurance, temazepam  Was not effective and had slurred speech the next day, trazodone seemed to make her RLS worse and leg pain worse, similar reaction to trying the tylenol PM.  PA has been denied , pt requested an appeal and states they should be contacting us here. Soon.  Clonazepam did not help as well.  Belsomra should be covered.  Past Medical History  Diagnosis Date  . Hypertension   . HYPERLIPIDEMIA 11/05/2007  . ANXIETY 05/13/2009  . DEPRESSIVE DISORDER 05/06/2008  . HYPERTENSION 11/05/2007  . ALLERGIC RHINITIS 11/05/2007  . CONSTIPATION, CHRONIC 05/13/2009  . UTI 05/06/2008  . BACK PAIN 05/11/2008  . OSTEOPOROSIS 11/05/2007  . INSOMNIA-SLEEP DISORDER-UNSPEC 05/13/2009  . FATIGUE 05/13/2009  . Swelling, mass, or lump in chest 11/05/2007  . LUNG CANCER, HX OF 05/06/2008  . 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 Lung cancer   . Brain cancer    Past Surgical History  Procedure Laterality Date  . Tubal ligation    . S/p lul lung surgury  12/2007  . Lobectomy      reports that she has never smoked. She has never used smokeless tobacco. She reports that she drinks alcohol. She reports that she does not use illicit drugs. family history includes ALS in her father; Alcohol abuse in her son; Heart disease in her mother. There is no history of Cancer. Allergies  Allergen Reactions  . Amoxicillin Hives and Itching  . Levaquin [Levofloxacin] Nausea And Vomiting   Current Outpatient Prescriptions on File Prior to Visit  Medication Sig Dispense Refill  . Alum & Mag Hydroxide-Simeth (MAGIC MOUTHWASH) SOLN Take 5 mLs by mouth 4 (four) times daily as needed for mouth pain. Swish and spit 240 mL 0  . aspirin 81 MG EC tablet Take 81 mg by mouth daily.      . Calcium Carbonate-Vitamin D (CALCIUM-VITAMIN D) 500-200 MG-UNIT per tablet Take 1  tablet by mouth 2 (two) times daily.      . folic acid (FOLVITE) 1 MG tablet TAKE 1 TABLET BY MOUTH DAILY 90 tablet 0  . HYDROcodone-acetaminophen (NORCO) 5-325 MG per tablet Take 1 tablet by mouth every 6 (six) hours as needed for moderate pain. 30 tablet 0  . LORazepam (ATIVAN) 0.5 MG tablet Take 1 tablet (0.5 mg total) by mouth every 8 (eight) hours as needed for anxiety. 40 tablet 1  . losartan-hydrochlorothiazide (HYZAAR) 50-12.5 MG per tablet   3  . Multiple Vitamin (ANTIOXIDANT A/C/E PO) Take by mouth daily.      [name] OLANZapine (ZYPREXA) 10 MG tablet TAKE 1 TABLET BY MOUTH EVERY NIGHT AT BEDTIME 90 tablet 0  . ondansetron (ZOFRAN ODT) 8 MG disintegrating tablet Take 1 tablet (8 mg total) by mouth every 8 (eight) hours as needed for nausea or vomiting. 30 tablet 1  . ondansetron (ZOFRAN) 8 MG tablet TAKE 1 TABLET BY MOUTH EVERY 8 HOURS AS NEEDED FOR FOR NAUSEA AND VOMITING 20 tablet 0  . polyethylene glycol powder (MIRALAX) powder Take 17 g by mouth daily.      . temazepam (RESTORIL) 15 MG capsule 1-2 tabs by mouthat bedtime as needed for sleep 60 capsule 5  . traZODone (DESYREL) 50 MG tablet Take 0.5-1 tablets (25-50 mg total) by mouth at  bedtime as needed for sleep. 180 tablet 1  . zolendronic acid (ZOMETA) 4 MG/5ML injection Inject 4 mg into the vein once.      . simvastatin (ZOCOR) 40 MG tablet Take 1 tablet (40 mg total) by mouth daily. 90 tablet 3   No current facility-administered medications on file prior to visit.   Review of Systems  All otherwise neg per pt    Objective:   Physical Exam BP 126/80 mmHg  Pulse 69  Temp(Src) 98 F (36.7 C) (Oral)  Resp 18  Ht 5\' 5"  (1.651 m)  Wt 129 lb (58.514 kg)  BMI 21.47 kg/m2  SpO2 97% VS noted,  Constitutional: Pt appears in no significant distress HENT: Head: NCAT.  Right Ear: External ear normal.  Left Ear: External ear normal.  Eyes: . Pupils are equal, round, and reactive to light. Conjunctivae and EOM are normal Neck: Normal  range of motion. Neck supple.  Cardiovascular: Normal rate and regular rhythm.   Pulmonary/Chest: Effort normal and breath sounds without rales or wheezing.  Abd:  Soft, NT, ND, + BS Neurological: Pt is alert. Not confused , motor grossly intact Skin: Skin is warm. No rash, no LE edema Psychiatric: Pt behavior is normal. No agitation.     Assessment & Plan:

## 2014-12-25 ENCOUNTER — Ambulatory Visit (HOSPITAL_COMMUNITY)
Admission: RE | Admit: 2014-12-25 | Discharge: 2014-12-25 | Disposition: A | Discharge status: Home / Self Care | Payer: Medicare Other | Source: Ambulatory Visit | Attending: Physician Assistant | Admitting: Physician Assistant

## 2014-12-25 ENCOUNTER — Encounter: Payer: Self-pay | Admitting: Pharmacist

## 2014-12-25 ENCOUNTER — Encounter (HOSPITAL_COMMUNITY): Payer: Self-pay

## 2014-12-25 DIAGNOSIS — C3490 Malignant neoplasm of unspecified part of unspecified bronchus or lung: Secondary | ICD-10-CM | POA: Diagnosis not present

## 2014-12-25 DIAGNOSIS — Z79899 Other long term (current) drug therapy: Secondary | ICD-10-CM | POA: Insufficient documentation

## 2014-12-25 DIAGNOSIS — C3492 Malignant neoplasm of unspecified part of left bronchus or lung: Secondary | ICD-10-CM

## 2014-12-25 MED ORDER — IOHEXOL 300 MG/ML  SOLN
100.0000 mL | Freq: Once | INTRAMUSCULAR | Status: AC | PRN
  Administered 2014-12-25: 100 mL via INTRAVENOUS

## 2014-12-25 NOTE — Assessment & Plan Note (Signed)
Ok to try belsomra, if no help to consider fu appeal pt made to her insurance after PA denial recent

## 2014-12-25 NOTE — Assessment & Plan Note (Signed)
stable overall by history and exam, recent data reviewed with pt, and pt to continue medical treatment as before,  to f/u any worsening symptoms or concerns BP Readings from Last 3 Encounters:  12/24/14 126/80  11/30/14 126/65  11/06/14 103/57

## 2014-12-28 ENCOUNTER — Ambulatory Visit (HOSPITAL_BASED_OUTPATIENT_CLINIC_OR_DEPARTMENT_OTHER): Payer: Medicare Other

## 2014-12-28 ENCOUNTER — Encounter: Payer: Self-pay | Admitting: General Practice

## 2014-12-28 ENCOUNTER — Encounter: Payer: Self-pay | Admitting: Oncology

## 2014-12-28 ENCOUNTER — Telehealth: Payer: Self-pay | Admitting: *Deleted

## 2014-12-28 ENCOUNTER — Other Ambulatory Visit (HOSPITAL_BASED_OUTPATIENT_CLINIC_OR_DEPARTMENT_OTHER): Payer: Medicare Other

## 2014-12-28 ENCOUNTER — Telehealth: Payer: Self-pay | Admitting: Oncology

## 2014-12-28 ENCOUNTER — Ambulatory Visit: Payer: Medicare Other

## 2014-12-28 ENCOUNTER — Ambulatory Visit (HOSPITAL_BASED_OUTPATIENT_CLINIC_OR_DEPARTMENT_OTHER): Payer: Medicare Other | Admitting: Oncology

## 2014-12-28 VITALS — BP 136/78 | HR 60 | Temp 97.7°F | Resp 18 | Ht 65.0 in | Wt 128.6 lb

## 2014-12-28 DIAGNOSIS — C7931 Secondary malignant neoplasm of brain: Secondary | ICD-10-CM

## 2014-12-28 DIAGNOSIS — C3492 Malignant neoplasm of unspecified part of left bronchus or lung: Secondary | ICD-10-CM

## 2014-12-28 DIAGNOSIS — C7951 Secondary malignant neoplasm of bone: Secondary | ICD-10-CM

## 2014-12-28 DIAGNOSIS — Z5111 Encounter for antineoplastic chemotherapy: Secondary | ICD-10-CM | POA: Diagnosis not present

## 2014-12-28 DIAGNOSIS — C801 Malignant (primary) neoplasm, unspecified: Secondary | ICD-10-CM

## 2014-12-28 DIAGNOSIS — C3412 Malignant neoplasm of upper lobe, left bronchus or lung: Secondary | ICD-10-CM

## 2014-12-28 LAB — CBC WITH DIFFERENTIAL/PLATELET
BASO%: 0.9 % (ref 0.0–2.0)
Basophils Absolute: 0 10*3/uL (ref 0.0–0.1)
EOS%: 0.5 % (ref 0.0–7.0)
Eosinophils Absolute: 0 10*3/uL (ref 0.0–0.5)
HCT: 39.4 % (ref 34.8–46.6)
HGB: 13.3 g/dL (ref 11.6–15.9)
LYMPH%: 32 % (ref 14.0–49.7)
MCH: 33.3 pg (ref 25.1–34.0)
MCHC: 33.8 g/dL (ref 31.5–36.0)
MCV: 98.5 fL (ref 79.5–101.0)
MONO#: 0.5 10*3/uL (ref 0.1–0.9)
MONO%: 10.2 % (ref 0.0–14.0)
NEUT%: 56.4 % (ref 38.4–76.8)
NEUTROS ABS: 2.5 10*3/uL (ref 1.5–6.5)
Platelets: 226 10*3/uL (ref 145–400)
RBC: 4 10*6/uL (ref 3.70–5.45)
RDW: 13.1 % (ref 11.2–14.5)
WBC: 4.4 10*3/uL (ref 3.9–10.3)
lymph#: 1.4 10*3/uL (ref 0.9–3.3)

## 2014-12-28 LAB — COMPREHENSIVE METABOLIC PANEL (CC13)
ALT: 19 U/L (ref 0–55)
ANION GAP: 10 meq/L (ref 3–11)
AST: 20 U/L (ref 5–34)
Albumin: 3.9 g/dL (ref 3.5–5.0)
Alkaline Phosphatase: 65 U/L (ref 40–150)
BILIRUBIN TOTAL: 0.53 mg/dL (ref 0.20–1.20)
BUN: 11.1 mg/dL (ref 7.0–26.0)
CO2: 23 meq/L (ref 22–29)
Calcium: 9.1 mg/dL (ref 8.4–10.4)
Chloride: 105 mEq/L (ref 98–109)
Creatinine: 0.8 mg/dL (ref 0.6–1.1)
EGFR: 72 mL/min/{1.73_m2} — ABNORMAL LOW (ref >90)
Glucose: 86 mg/dl (ref 70–140)
POTASSIUM: 4 meq/L (ref 3.5–5.1)
SODIUM: 137 meq/L (ref 136–145)
Total Protein: 6.9 g/dL (ref 6.4–8.3)

## 2014-12-28 MED ORDER — CYANOCOBALAMIN 1000 MCG/ML IJ SOLN
1000.0000 ug | Freq: Once | INTRAMUSCULAR | Status: AC
  Administered 2014-12-28: 1000 ug via INTRAMUSCULAR

## 2014-12-28 MED ORDER — CYANOCOBALAMIN 1000 MCG/ML IJ SOLN
INTRAMUSCULAR | Status: AC
  Filled 2014-12-28: qty 1

## 2014-12-28 MED ORDER — SODIUM CHLORIDE 0.9 % IV SOLN
Freq: Once | INTRAVENOUS | Status: AC
  Administered 2014-12-28: 12:00:00 via INTRAVENOUS
  Filled 2014-12-28: qty 4

## 2014-12-28 MED ORDER — SODIUM CHLORIDE 0.9 % IV SOLN
Freq: Once | INTRAVENOUS | Status: AC
  Administered 2014-12-28: 12:00:00 via INTRAVENOUS

## 2014-12-28 MED ORDER — PEMETREXED DISODIUM CHEMO INJECTION 500 MG
400.0000 mg/m2 | Freq: Once | INTRAVENOUS | Status: AC
  Administered 2014-12-28: 700 mg via INTRAVENOUS
  Filled 2014-12-28: qty 28

## 2014-12-28 MED ORDER — ZOLEDRONIC ACID 4 MG/100ML IV SOLN
4.0000 mg | Freq: Once | INTRAVENOUS | Status: AC
  Administered 2014-12-28: 4 mg via INTRAVENOUS
  Filled 2014-12-28: qty 100

## 2014-12-28 NOTE — Progress Notes (Signed)
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   PRIOR THERAPY:  1. Status post left upper lobectomy with mediastinal lymph node dissection under the care of Dr. Roxan Hockey on December 10, 2007.  2. Status post 4 cycles of adjuvant chemotherapy with cisplatin and Taxotere. Last dose was given 03/22/2008.  3. Status post 6 cycles of systemic chemotherapy with carboplatin and Alimta for metastatic disease in the bone. The last dose was given July 02, 2010, with stable disease.  4. Status post  12 cycles of maintenance Alimta at 500 mg/sq m given every 3 weeks. The last dose was given 05/08/2011. 5. Stereotactic radiotherapy to metastatic brain lesions under the care of Dr. Tammi Klippel completed on 07/30/2014.  CURRENT THERAPY:  1. Maintenance Alimta at 500 mg/sq m given every 4 weeks. The patient is status post 43 cycles.  2. Zometa 4 mg IV given every 3 months for bone metastasis.   CHEMOTHERAPY INTENT: Palliative  CURRENT # OF CHEMOTHERAPY CYCLES: 44 CURRENT ANTIEMETICS: none  CURRENT SMOKING STATUS: Never smoker   ORAL CHEMOTHERAPY AND CONSENT: n/a  CURRENT BISPHOSPHONATES USE: Yes, Zometa given every 3 months  PAIN MANAGEMENT: [employer]  NARCOTICS INDUCED CONSTIPATION: Occasional, uses MiraLax  LIVING WILL AND CODE STATUS: Has a living will   INTERVAL HISTORY: Tamara Ortiz 74 y.o. female returns to the clinic today for followup visit to review her scan results. The patient is feeling fine today with no specific complaints except intermittent low back pain which is better after she started treatment with Vicodin. She denied having any significant weight loss or  night sweats. She has no chest pain, shortness of breath, cough or hemoptysis. She denied having any significant nausea or vomiting and overall reports that her nausea and vomiting are better with a dose reduction and Alimta. She has no fever or chills. She is tolerating her treatment with Alimta fairly well with no significant adverse effects. She is here today to start cycle #44 of her treatment.  MEDICAL HISTORY: Past Medical History  Diagnosis Date  . Hypertension   . HYPERLIPIDEMIA 11/05/2007  . ANXIETY 05/13/2009  . DEPRESSIVE DISORDER 05/06/2008  . HYPERTENSION 11/05/2007  . ALLERGIC RHINITIS 11/05/2007  . CONSTIPATION, CHRONIC 05/13/2009  . UTI 05/06/2008  . BACK PAIN 05/11/2008  . OSTEOPOROSIS 11/05/2007  . INSOMNIA-SLEEP DISORDER-UNSPEC 05/13/2009  . FATIGUE 05/13/2009  . Swelling, mass, or lump in chest 11/05/2007  . LUNG CANCER, HX OF 05/06/2008  . 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 Lung cancer   . Brain cancer     ALLERGIES:  is allergic to amoxicillin and levaquin.  MEDICATIONS:  Current Outpatient Prescriptions  Medication Sig Dispense Refill  . Alum & Mag Hydroxide-Simeth (MAGIC MOUTHWASH) SOLN Take 5 mLs by mouth 4 (four) times daily as needed for mouth pain. Swish and spit 240 mL 0  . aspirin 81 MG EC tablet Take 81 mg by mouth daily.      . Calcium Carbonate-Vitamin D (CALCIUM-VITAMIN D) 500-200 MG-UNIT per tablet Take 1 tablet by mouth 2 (two) times daily.      . folic acid (FOLVITE) 1 MG tablet  TAKE 1 TABLET BY MOUTH DAILY 90 tablet 0  . HYDROcodone-acetaminophen (NORCO) 5-325 MG per tablet Take 1 tablet by mouth every 6 (six) hours as needed for moderate pain. 30 tablet 0  . LORazepam (ATIVAN) 0.5 MG tablet Take 1 tablet (0.5 mg total) by mouth every 8 (eight) hours as needed for anxiety. 40 tablet 1  . losartan-hydrochlorothiazide (HYZAAR) 50-12.5 MG per tablet   3  . Multiple Vitamin (ANTIOXIDANT A/C/E PO) Take by mouth daily.      [name] OLANZapine (ZYPREXA)  10 MG tablet TAKE 1 TABLET BY MOUTH EVERY NIGHT AT BEDTIME 90 tablet 0  . ondansetron (ZOFRAN ODT) 8 MG disintegrating tablet Take 1 tablet (8 mg total) by mouth every 8 (eight) hours as needed for nausea or vomiting. 30 tablet 1  . polyethylene glycol powder (MIRALAX) powder Take 17 g by mouth daily.      [name] zolendronic acid (ZOMETA) 4 MG/5ML injection Inject 4 mg into the vein once.      [name] zolpidem (AMBIEN CR) 6.25 MG CR tablet   1  . ondansetron (ZOFRAN) 8 MG tablet TAKE 1 TABLET BY MOUTH EVERY 8 HOURS AS NEEDED FOR FOR NAUSEA AND VOMITING (Patient not taking: Reported on 12/28/2014) 20 tablet 0  . simvastatin (ZOCOR) 40 MG tablet Take 1 tablet (40 mg total) by mouth daily. 90 tablet 3   No current facility-administered medications for this visit.    SURGICAL HISTORY:  Past Surgical History  Procedure Laterality Date  . Tubal ligation    . S/p lul lung surgury  12/2007  . Lobectomy      REVIEW OF SYSTEMS:  Constitutional: negative Eyes: negative Ears, nose, mouth, throat, and face: negative Respiratory: negative Cardiovascular: negative Gastrointestinal: positive for nausea and vomiting Genitourinary:negative Integument/breast: negative Hematologic/lymphatic: negative Musculoskeletal:positive for arthralgias and back pain Neurological: negative Behavioral/Psych: negative Endocrine: negative Allergic/Immunologic: negative   PHYSICAL EXAMINATION: General appearance: alert, cooperative and no distress Head: Normocephalic, without obvious abnormality, atraumatic Neck: no adenopathy Lymph nodes: Cervical, supraclavicular, and axillary nodes normal. Resp: clear to auscultation bilaterally Back: symmetric, no curvature. ROM normal. No CVA tenderness. Cardio: regular rate and rhythm, S1, S2 normal, no murmur, click, rub or gallop GI: soft, non-tender; bowel sounds normal; no masses,  no organomegaly Extremities: extremities normal, atraumatic, no cyanosis or edema Neurologic: Alert  and oriented X 3, normal strength and tone. Normal symmetric reflexes. Normal coordination and gait  ECOG PERFORMANCE STATUS: 1 - Symptomatic but completely ambulatory  Blood pressure 136/78, pulse 60, temperature 97.7 F (36.5 C), temperature source Oral, resp. rate 18, height $RemoveBe'5\' 5"'mSQYLznlZ$  (1.651 m), weight 128 lb 9.6 oz (58.333 kg), SpO2 100 %.  LABORATORY DATA: Lab Results  Component Value Date   WBC 4.4 12/28/2014   HGB 13.3 12/28/2014   HCT 39.4 12/28/2014   MCV 98.5 12/28/2014   PLT 226 12/28/2014      Chemistry      Component Value Date/Time   NA 137 12/28/2014 1034   NA 137 08/12/2014 1716   NA 140 04/17/2012 0855   K 4.0 12/28/2014 1034   K 3.7 08/12/2014 1716   K 4.1 04/17/2012 0855   CL 99 08/12/2014 1716   CL 100 03/04/2013 0901   CL 99 04/17/2012 0855   CO2 23 12/28/2014 1034   CO2 35* 08/12/2014 1716   CO2 29 04/17/2012 0855   BUN 11.1 12/28/2014 1034   BUN 16 08/12/2014 1716   BUN 15 04/17/2012 0855   CREATININE 0.8 12/28/2014  1034   CREATININE 1.1 08/12/2014 1716   CREATININE 1.4* 04/17/2012 0855      Component Value Date/Time   CALCIUM 9.1 12/28/2014 1034   CALCIUM 10.6* 08/12/2014 1716   CALCIUM 9.5 04/17/2012 0855   ALKPHOS 65 12/28/2014 1034   ALKPHOS 79 08/12/2014 1716   ALKPHOS 68 04/17/2012 0855   AST 20 12/28/2014 1034   AST 28 08/12/2014 1716   AST 29 04/17/2012 0855   ALT 19 12/28/2014 1034   ALT 24 08/12/2014 1716   ALT 31 04/17/2012 0855   BILITOT 0.53 12/28/2014 1034   BILITOT 0.4 08/12/2014 1716   BILITOT 0.60 04/17/2012 0855       RADIOGRAPHIC STUDIES: Ct Chest W Contrast  12/25/2014   CLINICAL DATA:  Lung cancer diagnosed 2009. Chemotherapy in progress. Radiation therapy complete. Subsequent treatment evaluation  EXAM: CT CHEST, ABDOMEN, AND PELVIS WITH CONTRAST  TECHNIQUE: Multidetector CT imaging of the chest, abdomen and pelvis was performed following the standard protocol during bolus administration of intravenous contrast.   CONTRAST:  134m OMNIPAQUE IOHEXOL 300 MG/ML  SOLN  COMPARISON:  CT 09/30/2014  FINDINGS: CT CHEST FINDINGS  Mediastinum/Nodes: No axillary supraclavicular lymphadenopathy. No mediastinal hilar lymphadenopathy. No pericardial fluid. No central pulmonary embolism. Esophagus is gas-filled. No obstructing lesion.  Lungs/Pleura: Ill-defined nodule along the left oblique fissure measuring 4 mm (Image 32, series 5) is unchanged.  CT ABDOMEN AND PELVIS FINDINGS  Hepatobiliary: No focal hepatic lesion.  Normal gallbladder.  Pancreas: Pancreas is normal. No ductal dilatation. No pancreatic inflammation.  Spleen: normal spleen  Adrenals/Urinary Tract: Adrenal glands and kidneys are normal. The ureters and bladder normal.  Stomach/Bowel: Stomach, small bowel, and colon are normal.  Vascular/Lymphatic: Abdominal aorta is normal caliber. There is no retroperitoneal or periportal lymphadenopathy. No pelvic lymphadenopathy.  Reproductive: Uterus and ovaries are normal.  Other: No peritoneal disease.  Musculoskeletal: Multiple sclerotic metastasis again demonstrated and not changed in size or number. Example lesion in the S1 vertebral body measures 15 mm unchanged from 15 mm. Lesions throughout the spine and pelvis.  IMPRESSION: Chest Impression:  1. No evidence of lung cancer recurrence. 2. Stable small left pulmonary nodule.  Abdomen / Pelvis Impression:  1. No metastatic disease to the soft tissues of the abdomen pelvis. 2. Stable sclerotic skeletal metastasis in the pelvis and spine.   Electronically Signed   By: SSuzy BouchardM.D.   On: 12/25/2014 10:07   Ct Abdomen Pelvis W Contrast  12/25/2014   CLINICAL DATA:  Lung cancer diagnosed 2009. Chemotherapy in progress. Radiation therapy complete. Subsequent treatment evaluation  EXAM: CT CHEST, ABDOMEN, AND PELVIS WITH CONTRAST  TECHNIQUE: Multidetector CT imaging of the chest, abdomen and pelvis was performed following the standard protocol during bolus administration of  intravenous contrast.  CONTRAST:  1055mOMNIPAQUE IOHEXOL 300 MG/ML  SOLN  COMPARISON:  CT 09/30/2014  FINDINGS: CT CHEST FINDINGS  Mediastinum/Nodes: No axillary supraclavicular lymphadenopathy. No mediastinal hilar lymphadenopathy. No pericardial fluid. No central pulmonary embolism. Esophagus is gas-filled. No obstructing lesion.  Lungs/Pleura: Ill-defined nodule along the left oblique fissure measuring 4 mm (Image 32, series 5) is unchanged.  CT ABDOMEN AND PELVIS FINDINGS  Hepatobiliary: No focal hepatic lesion.  Normal gallbladder.  Pancreas: Pancreas is normal. No ductal dilatation. No pancreatic inflammation.  Spleen: normal spleen  Adrenals/Urinary Tract: Adrenal glands and kidneys are normal. The ureters and bladder normal.  Stomach/Bowel: Stomach, small bowel, and colon are normal.  Vascular/Lymphatic: Abdominal aorta is normal caliber. There is no retroperitoneal  or periportal lymphadenopathy. No pelvic lymphadenopathy.  Reproductive: Uterus and ovaries are normal.  Other: No peritoneal disease.  Musculoskeletal: Multiple sclerotic metastasis again demonstrated and not changed in size or number. Example lesion in the S1 vertebral body measures 15 mm unchanged from 15 mm. Lesions throughout the spine and pelvis.  IMPRESSION: Chest Impression:  1. No evidence of lung cancer recurrence. 2. Stable small left pulmonary nodule.  Abdomen / Pelvis Impression:  1. No metastatic disease to the soft tissues of the abdomen pelvis. 2. Stable sclerotic skeletal metastasis in the pelvis and spine.   Electronically Signed   By: Suzy Bouchard M.D.   On: 12/25/2014 10:07    ASSESSMENT AND PLAN: This is a very pleasant 74 year old white female with recurrent non-small cell lung cancer, adenocarcinoma. The patient is currently on maintenance Alimta monthly status post total of 43 cycles.  The patient is feeling fine and tolerating the treatment well.   Patient was discussed with and also seen by Dr. Julien Nordmann. CT  scan results were discussed. Her CT shows stable disease. She will proceed with cycle #44 today as scheduled. Regarding her nausea medications patient is reluctant to start the Zyprexa. She is advised to alternate her Compazine and Zofran and may also take Ativan as needed for nausea. These measures are suboptimal for controlling her nausea and vomiting, we will revisit initiation of Zyprexa.  The patient was advised to call immediately if she has any concerning symptoms in the interval.  The patient voices understanding of current disease status and treatment options and is in agreement with the current care plan.  All questions were answered. The patient knows to call the clinic with any problems, questions or concerns. We can certainly see the patient much sooner if necessary.  Mikey Bussing, DNP, AGPCNP-BC, AOCNP 12/28/2014  ADDENDUM: Hematology/Oncology Attending: I had a face to face encounter with the patient today. I recommended her care plan. This is a very pleasant 74 years old white female with metastatic non-small cell lung cancer, adenocarcinoma was currently undergoing maintenance treatment with Alimta currently 400 mg/M2 every 4 weeks. The patient tolerated the last cycle of her treatment fairly well with no significant adverse effect effects except for occasional nausea. She is currently on several until he may takes including Compazine and Zofran in addition to occasional Ativan as needed. She was given prescription for Zyprexa but the patient is reluctant in using it. Her recent CT scan of the chest, abdomen and pelvis showed no evidence for disease progression. I discussed the scan results with the patient today. I recommended for her to continue her current treatment with maintenance Alimta as a scheduled. She will proceed with cycle #44 today. She will come back for follow-up visit in 4 weeks with the next cycle of her treatment. The patient was advised to call immediately if  she has any concerning symptoms in the interval.  Disclaimer: This note was dictated with voice recognition software. Similar sounding words can inadvertently be transcribed and may be missed upon review. Eilleen Kempf., MD 12/28/2014

## 2014-12-28 NOTE — Telephone Encounter (Signed)
Pt confirmed labs/ov per 03/28 POF, gave pt AVS and calendar.... KJ, sent msg to add chemo

## 2014-12-28 NOTE — Progress Notes (Signed)
Acquainted from the [facility], visited with Ms Audi in the lobby this morning, as she shared daily joys and processed her concern about sleep/rx difficulties and her plan to f/u with MD/PCP for assistance.  Provided pastoral presence, reflective listening, and encouragement.  [place], [place]

## 2014-12-28 NOTE — Patient Instructions (Signed)
[facility] Discharge Instructions for Patients Receiving Chemotherapy  Today you received the following chemotherapy agents :  Alimta,  Zometa  To help prevent nausea and vomiting after your treatment, we encourage you to take your nausea medication as prescribed by your physician   If you develop nausea and vomiting that is not controlled by your nausea medication, call the clinic.   BELOW ARE SYMPTOMS THAT SHOULD BE REPORTED IMMEDIATELY:  *FEVER GREATER THAN 100.5 F  *CHILLS WITH OR WITHOUT FEVER  NAUSEA AND VOMITING THAT IS NOT CONTROLLED WITH YOUR NAUSEA MEDICATION  *UNUSUAL SHORTNESS OF BREATH  *UNUSUAL BRUISING OR BLEEDING  TENDERNESS IN MOUTH AND THROAT WITH OR WITHOUT PRESENCE OF ULCERS  *URINARY PROBLEMS  *BOWEL PROBLEMS  UNUSUAL RASH Items with * indicate a potential emergency and should be followed up as soon as possible.  Feel free to call the clinic you have any questions or concerns. The clinic phone number is [phone].  Please show the [facility] at check-in to the Emergency Department and triage nurse.

## 2014-12-28 NOTE — Telephone Encounter (Signed)
[facility] Night - Client TELEPHONE ADVICE RECORD [facility] Patient Name: Tamara Ortiz Gender: Female DOB: 1941-01-05 Age: 74 Y 3 M 12 D Return Phone Number: [phone] (Primary), [phone] (Secondary) Address: [address] City/State/Zip: [place] Client [place] Night - Client Client Site [place] - Night Physician John, [place] Type Fax Call Type Triage / Clinical Relationship To Patient Self Return Phone Number [phone] (Primary) Chief Complaint Insomnia Initial Comment Caller states she is having a sleep problem and she recently changed her sleeping meds. PreDisposition Did not know what to do Nurse Assessment Nurse: Lovena Le, RN, Truman Hayward Date/Time Eilene Ghazi Time): 12/28/2014 2:17:22 AM Confirm and document reason for call. If symptomatic, describe symptoms. ---Caller states she is having a sleep problem and she recently changed her sleeping meds. Went from [place] to 3 different meds but none have worked. Has the patient traveled out of the country within the last 30 days? ---Not Applicable Does the patient require triage? ---Yes Related visit to physician within the last 2 weeks? ---No Does the PT have any chronic conditions? (i.e. diabetes, asthma, etc.) ---Yes List chronic conditions. ---chemo patient HTN cholesterol Guidelines Guideline Title Affirmed Question Affirmed Notes Nurse Date/Time (Eastern Time) Insomnia Requesting medication for sleep ("sleeping pill") Margarita Sermons 12/28/2014 2:18:39 AM Disp. Time Eilene Ghazi Time) Disposition Final User 12/28/2014 2:27:24 AM Call PCP within 24 Hours Yes Lovena Le, RN, Windell Hummingbird Understands: Yes Disagree/Comply: Comply Care Advice Given Per Guideline PLEASE NOTE: All timestamps contained within this report are represented as [place] Standard Time. CONFIDENTIALTY NOTICE: This fax transmission is intended only for the addressee. It contains  information that is legally privileged, confidential or otherwise protected from use or disclosure. If you are not the intended recipient, you are strictly prohibited from reviewing, disclosing, copying using or disseminating any of this information or taking any action in reliance on or regarding this information. If you have received this fax in error, please notify us immediately by telephone so that we can arrange for its return to [place]. Phone: [phone], Toll-Free: [phone], Fax: [phone] Page: 2 of 2 Call Id: [id number] Care Advice Given Per Guideline CALL PCP WITHIN 24 HOURS: You need to discuss this with your doctor within the next 24 hours. * IF OFFICE WILL BE OPEN: Call the office when it opens tomorrow morning. * Generally, such calls can wait until the morning (7 AM to 10 AM). If the office is open the next day, then ask the caller to phone again during office hours (9 AM - 4 PM). CALL BACK IF: * You become worse. CARE ADVICE given per Insomnia (Adult) guideline. After Care Instructions Given Call Event Type User Date / Time Description Comments User: Alger Simons, RN Date/Time Eilene Ghazi Time): 12/28/2014 2:32:33 AM Caller states she is in appeal process now for the denial of ambien by insurance. Referrals REFERRED TO PCP OFFICE

## 2014-12-28 NOTE — Telephone Encounter (Signed)
Per staff message and POF I have scheduled appts. Advised scheduler of appts. [name]  

## 2014-12-29 ENCOUNTER — Telehealth: Payer: Self-pay | Admitting: Internal Medicine

## 2014-12-30 ENCOUNTER — Telehealth: Payer: Self-pay | Admitting: Internal Medicine

## 2014-12-30 ENCOUNTER — Encounter: Payer: Self-pay | Admitting: Internal Medicine

## 2014-12-30 NOTE — Telephone Encounter (Signed)
Tamara Ortiz,  Patient dropped off denial for zolpidem from medicare yesterday. tammy noted she got it, but i cannot find it in my paperwork. Did you receive this? Its just a PA. Please let me know and i can have her drop off another copy if needed.

## 2014-12-31 ENCOUNTER — Encounter: Payer: Self-pay | Admitting: Internal Medicine

## 2014-12-31 NOTE — Telephone Encounter (Signed)
Paperwork is at the front desk and ready for pick up.

## 2015-01-01 ENCOUNTER — Telehealth: Payer: Self-pay

## 2015-01-01 NOTE — Telephone Encounter (Signed)
Pt noted decrease in alimta dose on her AVS. It was decreased from 500/m sq to 400/m sq on 3/28. No mention of why was in the OV note. She was asking for the reason. She also was asking about melatonin and st john's wort for sleep. I instructed her not to use st john's wort. She stated she cannot use benadry b/c it wires her up.

## 2015-01-01 NOTE — Telephone Encounter (Signed)
Called pt with dr Worthy Flank response.

## 2015-01-01 NOTE — Telephone Encounter (Signed)
Her dose was decreased because of the nausea and fatigue. Provider judgement. Ok to use Melatonin.

## 2015-01-04 NOTE — Telephone Encounter (Signed)
Message pt with schedule with chemo added.... KJ

## 2015-01-08 ENCOUNTER — Telehealth: Payer: Self-pay | Admitting: Internal Medicine

## 2015-01-08 ENCOUNTER — Other Ambulatory Visit: Payer: Self-pay

## 2015-01-08 MED ORDER — ZOLPIDEM TARTRATE ER 6.25 MG PO TBCR
6.2500 mg | EXTENDED_RELEASE_TABLET | Freq: Every evening | ORAL | Status: DC | PRN
Start: 2015-01-08 — End: 2015-07-07

## 2015-01-08 MED ORDER — SUVOREXANT 15 MG PO TABS: 1.0000 | ORAL_TABLET | Freq: Every evening | ORAL | Status: DC | PRN

## 2015-01-08 NOTE — Telephone Encounter (Signed)
Done

## 2015-01-08 NOTE — Telephone Encounter (Signed)
Pt came in to pick up written Rx for Belsomra, pt stated that this med is not helping her (pt didn't take the written Rx with her). Pt was wondering if we can cancel the order of zolpidem (AMBIEN CR) 6.25 MG CR tablet at [facility] and send to [facility] instead because it will cost her $17 instead of $180 at [facility]. Please help.

## 2015-01-08 NOTE — Telephone Encounter (Signed)
Done hardcopy to Cherina  

## 2015-01-08 NOTE — Telephone Encounter (Signed)
Pt called in said that she wants for Dr Jenny Reichmann to call in her zolpidem (AMBIEN CR) 6.25 MG CR tablet [[id number]].  She said that she is going to pay for it out of pocket because ins is not coving it.  She has filed an appeal for it.  She said that she is not sleeping and needs it

## 2015-01-08 NOTE — Telephone Encounter (Signed)
Called patient back to let her know that her prescription up front at the desk attached with a discount coupon.

## 2015-01-08 NOTE — Telephone Encounter (Signed)
belsomra Done hardcopy to [employer]

## 2015-01-08 NOTE — Telephone Encounter (Signed)
Spoke with patient to tell her that her insurance company refuses to cover the Ambien and that she needed to be on the [place] for at least 90 days. She wants to have the prescription in hand so that she can shop around the different pharmacies to find the cheapest price since she is willing to pay for it out of pocket. I have printed out a discount coupon that hopefully she can use at the pharmacy.

## 2015-01-25 ENCOUNTER — Telehealth: Payer: Self-pay | Admitting: Internal Medicine

## 2015-01-25 ENCOUNTER — Ambulatory Visit (HOSPITAL_BASED_OUTPATIENT_CLINIC_OR_DEPARTMENT_OTHER): Payer: Medicare Other

## 2015-01-25 ENCOUNTER — Ambulatory Visit (HOSPITAL_BASED_OUTPATIENT_CLINIC_OR_DEPARTMENT_OTHER): Payer: Medicare Other | Admitting: Internal Medicine

## 2015-01-25 ENCOUNTER — Encounter: Payer: Self-pay | Admitting: Internal Medicine

## 2015-01-25 ENCOUNTER — Other Ambulatory Visit: Payer: Self-pay | Admitting: Medical Oncology

## 2015-01-25 ENCOUNTER — Other Ambulatory Visit (HOSPITAL_BASED_OUTPATIENT_CLINIC_OR_DEPARTMENT_OTHER): Payer: Medicare Other

## 2015-01-25 VITALS — BP 123/68 | HR 62 | Temp 98.4°F | Resp 18 | Ht 65.0 in | Wt 126.8 lb

## 2015-01-25 DIAGNOSIS — C3492 Malignant neoplasm of unspecified part of left bronchus or lung: Secondary | ICD-10-CM

## 2015-01-25 DIAGNOSIS — C3490 Malignant neoplasm of unspecified part of unspecified bronchus or lung: Secondary | ICD-10-CM

## 2015-01-25 DIAGNOSIS — C801 Malignant (primary) neoplasm, unspecified: Secondary | ICD-10-CM

## 2015-01-25 DIAGNOSIS — Z5111 Encounter for antineoplastic chemotherapy: Secondary | ICD-10-CM

## 2015-01-25 DIAGNOSIS — C7951 Secondary malignant neoplasm of bone: Secondary | ICD-10-CM

## 2015-01-25 DIAGNOSIS — C7931 Secondary malignant neoplasm of brain: Secondary | ICD-10-CM

## 2015-01-25 DIAGNOSIS — R5383 Other fatigue: Secondary | ICD-10-CM | POA: Diagnosis not present

## 2015-01-25 DIAGNOSIS — C3412 Malignant neoplasm of upper lobe, left bronchus or lung: Secondary | ICD-10-CM | POA: Diagnosis present

## 2015-01-25 DIAGNOSIS — M545 Low back pain: Secondary | ICD-10-CM | POA: Diagnosis not present

## 2015-01-25 DIAGNOSIS — R195 Other fecal abnormalities: Secondary | ICD-10-CM | POA: Diagnosis not present

## 2015-01-25 DIAGNOSIS — K921 Melena: Secondary | ICD-10-CM

## 2015-01-25 LAB — COMPREHENSIVE METABOLIC PANEL (CC13)
ALBUMIN: 3.6 g/dL (ref 3.5–5.0)
ALK PHOS: 61 U/L (ref 40–150)
ALT: 13 U/L (ref 0–55)
AST: 19 U/L (ref 5–34)
Anion Gap: 13 mEq/L — ABNORMAL HIGH (ref 3–11)
BILIRUBIN TOTAL: 0.41 mg/dL (ref 0.20–1.20)
BUN: 12.6 mg/dL (ref 7.0–26.0)
CALCIUM: 9.3 mg/dL (ref 8.4–10.4)
CHLORIDE: 98 meq/L (ref 98–109)
CO2: 26 mEq/L (ref 22–29)
CREATININE: 0.9 mg/dL (ref 0.6–1.1)
EGFR: 68 mL/min/{1.73_m2} — ABNORMAL LOW (ref >90)
Glucose: 91 mg/dl (ref 70–140)
POTASSIUM: 3.8 meq/L (ref 3.5–5.1)
SODIUM: 136 meq/L (ref 136–145)
Total Protein: 6.5 g/dL (ref 6.4–8.3)

## 2015-01-25 LAB — CBC WITH DIFFERENTIAL/PLATELET
BASO%: 1.1 % (ref 0.0–2.0)
Basophils Absolute: 0.1 10*3/uL (ref 0.0–0.1)
EOS%: 0.9 % (ref 0.0–7.0)
Eosinophils Absolute: 0 10*3/uL (ref 0.0–0.5)
HEMATOCRIT: 37.1 % (ref 34.8–46.6)
HGB: 12.3 g/dL (ref 11.6–15.9)
LYMPH%: 23.7 % (ref 14.0–49.7)
MCH: 32.2 pg (ref 25.1–34.0)
MCHC: 33.2 g/dL (ref 31.5–36.0)
MCV: 97.1 fL (ref 79.5–101.0)
MONO#: 0.4 10*3/uL (ref 0.1–0.9)
MONO%: 9.8 % (ref 0.0–14.0)
NEUT%: 64.5 % (ref 38.4–76.8)
NEUTROS ABS: 2.9 10*3/uL (ref 1.5–6.5)
Platelets: 235 10*3/uL (ref 145–400)
RBC: 3.82 10*6/uL (ref 3.70–5.45)
RDW: 13 % (ref 11.2–14.5)
WBC: 4.5 10*3/uL (ref 3.9–10.3)
lymph#: 1.1 10*3/uL (ref 0.9–3.3)

## 2015-01-25 MED ORDER — SODIUM CHLORIDE 0.9 % IV SOLN
Freq: Once | INTRAVENOUS | Status: AC
  Administered 2015-01-25: 11:00:00 via INTRAVENOUS
  Filled 2015-01-25: qty 4

## 2015-01-25 MED ORDER — HYDROCODONE-ACETAMINOPHEN 5-325 MG PO TABS: 1.0000 | ORAL_TABLET | Freq: Four times a day (QID) | ORAL | Status: DC | PRN

## 2015-01-25 MED ORDER — SODIUM CHLORIDE 0.9 % IV SOLN
Freq: Once | INTRAVENOUS | Status: AC
  Administered 2015-01-25: 11:00:00 via INTRAVENOUS

## 2015-01-25 MED ORDER — PEMETREXED DISODIUM CHEMO INJECTION 500 MG
400.0000 mg/m2 | Freq: Once | INTRAVENOUS | Status: AC
  Administered 2015-01-25: 700 mg via INTRAVENOUS
  Filled 2015-01-25: qty 28

## 2015-01-25 NOTE — Telephone Encounter (Signed)
Gave avs & calendar for May. Sent message to schedule treatment. °

## 2015-01-25 NOTE — Progress Notes (Signed)
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   PRIOR THERAPY:  1. Status post left upper lobectomy with mediastinal lymph node dissection under the care of Dr. Roxan Hockey on December 10, 2007.  2. Status post 4 cycles of adjuvant chemotherapy with cisplatin and Taxotere. Last dose was given 03/22/2008.  3. Status post 6 cycles of systemic chemotherapy with carboplatin and Alimta for metastatic disease in the bone. The last dose was given July 02, 2010, with stable disease.  4. Status post 12 cycles of maintenance Alimta at 500 mg/sq m given every 3 weeks. The last dose was given 05/08/2011. 5. Stereotactic radiotherapy to metastatic brain lesions under the care of Dr. Tammi Klippel completed on 07/30/2014.  CURRENT THERAPY:  1. Maintenance Alimta at 500 mg/sq m given every 4 weeks. The patient is status post 44 cycles.  2. Zometa 4 mg IV given every 2 months for bone metastasis.   CHEMOTHERAPY INTENT: Palliative  CURRENT # OF CHEMOTHERAPY CYCLES: 45 CURRENT ANTIEMETICS: none  CURRENT SMOKING STATUS: Never smoker   ORAL CHEMOTHERAPY AND CONSENT: n/a  CURRENT BISPHOSPHONATES USE: Yes, Zometa given every 2 months  PAIN MANAGEMENT: Norco  NARCOTICS INDUCED CONSTIPATION: Occasional, uses MiraLax  LIVING WILL AND CODE STATUS: Has a living will   INTERVAL HISTORY: Tamara Ortiz 74 y.o. female returns to the clinic today for followup visit. The patient is feeling fine today with no specific complaints except for increasing fatigue recently. She denied having any significant weight loss or night sweats. She has no chest pain, shortness of breath, cough or hemoptysis.  She denied having any significant nausea or vomiting but she noticed black tarry stool. She has no fever or chills. She is tolerating her treatment with Alimta fairly well with no significant adverse effects. She is here today to start cycle #45 of her treatment.  MEDICAL HISTORY: Past Medical History  Diagnosis Date  . Hypertension   . HYPERLIPIDEMIA 11/05/2007  . ANXIETY 05/13/2009  . DEPRESSIVE DISORDER 05/06/2008  . HYPERTENSION 11/05/2007  . ALLERGIC RHINITIS 11/05/2007  . CONSTIPATION, CHRONIC 05/13/2009  . UTI 05/06/2008  . BACK PAIN 05/11/2008  . OSTEOPOROSIS 11/05/2007  . INSOMNIA-SLEEP DISORDER-UNSPEC 05/13/2009  . FATIGUE 05/13/2009  . Swelling, mass, or lump in chest 11/05/2007  . LUNG CANCER, HX OF 05/06/2008  . 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 Lung cancer   . Brain cancer     ALLERGIES:  is allergic to amoxicillin and levaquin.  MEDICATIONS:  Current Outpatient Prescriptions  Medication Sig Dispense Refill  . Alum & Mag Hydroxide-Simeth (MAGIC MOUTHWASH) SOLN Take 5 mLs by mouth 4 (four) times daily as needed for mouth pain. Swish and spit 240 mL 0  . aspirin 81 MG EC tablet Take 81 mg by mouth daily.      . Calcium Carbonate-Vitamin D (CALCIUM-VITAMIN D) 500-200 MG-UNIT per tablet Take 1 tablet by mouth 2 (two) times daily.      . folic acid (FOLVITE) 1 MG tablet TAKE 1 TABLET BY MOUTH DAILY 90 tablet 0  . HYDROcodone-acetaminophen (NORCO) 5-325 MG per tablet Take 1 tablet by mouth every 6 (six)  hours as needed for moderate pain. 30 tablet 0  . LORazepam (ATIVAN) 0.5 MG tablet Take 1 tablet (0.5 mg total) by mouth every 8 (eight) hours as needed for anxiety. 40 tablet 1  . losartan-hydrochlorothiazide (HYZAAR) 50-12.5 MG per tablet   3  . Multiple Vitamin (ANTIOXIDANT A/C/E PO) Take by mouth daily.      [name] OLANZapine (ZYPREXA) 10 MG tablet TAKE 1 TABLET BY MOUTH EVERY NIGHT AT BEDTIME 90 tablet 0  . ondansetron (ZOFRAN ODT) 8 MG disintegrating tablet Take 1  tablet (8 mg total) by mouth every 8 (eight) hours as needed for nausea or vomiting. 30 tablet 1  . ondansetron (ZOFRAN) 8 MG tablet TAKE 1 TABLET BY MOUTH EVERY 8 HOURS AS NEEDED FOR FOR NAUSEA AND VOMITING (Patient not taking: Reported on 12/28/2014) 20 tablet 0  . polyethylene glycol powder (MIRALAX) powder Take 17 g by mouth daily.      . simvastatin (ZOCOR) 40 MG tablet Take 1 tablet (40 mg total) by mouth daily. 90 tablet 3  . Suvorexant (BELSOMRA) 15 MG TABS Take 1 tablet by mouth at bedtime as needed. 30 tablet 5  . zolendronic acid (ZOMETA) 4 MG/5ML injection Inject 4 mg into the vein once.      [name] zolpidem (AMBIEN CR) 6.25 MG CR tablet Take 1 tablet (6.25 mg total) by mouth at bedtime as needed for sleep. 90 tablet 1   No current facility-administered medications for this visit.    SURGICAL HISTORY:  Past Surgical History  Procedure Laterality Date  . Tubal ligation    . S/p lul lung surgury  12/2007  . Lobectomy      REVIEW OF SYSTEMS:  Constitutional: negative Eyes: negative Ears, nose, mouth, throat, and face: negative Respiratory: negative Cardiovascular: negative Gastrointestinal: negative Genitourinary:negative Integument/breast: negative Hematologic/lymphatic: negative Musculoskeletal:positive for arthralgias and back pain Neurological: negative Behavioral/Psych: negative Endocrine: negative Allergic/Immunologic: negative   PHYSICAL EXAMINATION: General appearance: alert, cooperative and no distress Head: Normocephalic, without obvious abnormality, atraumatic Neck: no adenopathy Lymph nodes: Cervical, supraclavicular, and axillary nodes normal. Resp: clear to auscultation bilaterally Back: symmetric, no curvature. ROM normal. No CVA tenderness. Cardio: regular rate and rhythm, S1, S2 normal, no murmur, click, rub or gallop GI: soft, non-tender; bowel sounds normal; no masses,  no organomegaly Extremities: extremities normal, atraumatic, no cyanosis or  edema Neurologic: Alert and oriented X 3, normal strength and tone. Normal symmetric reflexes. Normal coordination and gait  ECOG PERFORMANCE STATUS: 1 - Symptomatic but completely ambulatory  Blood pressure 123/68, pulse 62, temperature 98.4 F (36.9 C), temperature source Oral, resp. rate 18, height '5\' 5"'  (1.651 m), weight 126 lb 12.8 oz (57.516 kg), SpO2 100 %.  LABORATORY DATA: Lab Results  Component Value Date   WBC 4.5 01/25/2015   HGB 12.3 01/25/2015   HCT 37.1 01/25/2015   MCV 97.1 01/25/2015   PLT 235 01/25/2015      Chemistry      Component Value Date/Time   NA 137 12/28/2014 1034   NA 137 08/12/2014 1716   NA 140 04/17/2012 0855   K 4.0 12/28/2014 1034   K 3.7 08/12/2014 1716   K 4.1 04/17/2012 0855   CL 99 08/12/2014 1716   CL 100 03/04/2013 0901   CL 99 04/17/2012 0855   CO2 23 12/28/2014 1034   CO2 35* 08/12/2014 1716   CO2 29 04/17/2012 0855   BUN 11.1 12/28/2014 1034   BUN 16 08/12/2014 1716   BUN 15 04/17/2012 0855  CREATININE 0.8 12/28/2014 1034   CREATININE 1.1 08/12/2014 1716   CREATININE 1.4* 04/17/2012 0855      Component Value Date/Time   CALCIUM 9.1 12/28/2014 1034   CALCIUM 10.6* 08/12/2014 1716   CALCIUM 9.5 04/17/2012 0855   ALKPHOS 65 12/28/2014 1034   ALKPHOS 79 08/12/2014 1716   ALKPHOS 68 04/17/2012 0855   AST 20 12/28/2014 1034   AST 28 08/12/2014 1716   AST 29 04/17/2012 0855   ALT 19 12/28/2014 1034   ALT 24 08/12/2014 1716   ALT 31 04/17/2012 0855   BILITOT 0.53 12/28/2014 1034   BILITOT 0.4 08/12/2014 1716   BILITOT 0.60 04/17/2012 0855       RADIOGRAPHIC STUDIES: No results found.  ASSESSMENT AND PLAN: This is a very pleasant 74 years old white female with recurrent non-small cell lung cancer, adenocarcinoma. The patient is currently on maintenance Alimta monthly status post total of 44 cycles.  The patient is feeling fine and tolerating the treatment well. I recommended for the patient to continue her current  treatment with single agent Alimta as scheduled. She would proceed with cycle #45 today as scheduled. For the fatigue and black tarry stool, I will order stool tested for Hemoccult to rule out any gastrointestinal bleed. Her hemoglobin and hematocrit are stable. For the low back pain, the patient will continue on Vicodin on as-needed basis She would come back for followup visit in one month for reevaluation with the next cycle of her chemotherapy. The patient was advised to call immediately if she has any concerning symptoms in the interval.  The patient voices understanding of current disease status and treatment options and is in agreement with the current care plan.  All questions were answered. The patient knows to call the clinic with any problems, questions or concerns. We can certainly see the patient much sooner if necessary.  Disclaimer: This note was dictated with voice recognition software. Similar sounding words can inadvertently be transcribed and may not be corrected upon review.

## 2015-01-25 NOTE — Progress Notes (Signed)
norco refill given to pt.

## 2015-01-25 NOTE — Patient Instructions (Signed)
[facility] Discharge Instructions for Patients Receiving Chemotherapy  Today you received the following chemotherapy agents: alimta  To help prevent nausea and vomiting after your treatment, we encourage you to take your nausea medication.  Take it as often as prescribed.     If you develop nausea and vomiting that is not controlled by your nausea medication, call the clinic. If it is after clinic hours your family physician or the after hours number for the clinic or go to the Emergency Department.   BELOW ARE SYMPTOMS THAT SHOULD BE REPORTED IMMEDIATELY:  *FEVER GREATER THAN 100.5 F  *CHILLS WITH OR WITHOUT FEVER  NAUSEA AND VOMITING THAT IS NOT CONTROLLED WITH YOUR NAUSEA MEDICATION  *UNUSUAL SHORTNESS OF BREATH  *UNUSUAL BRUISING OR BLEEDING  TENDERNESS IN MOUTH AND THROAT WITH OR WITHOUT PRESENCE OF ULCERS  *URINARY PROBLEMS  *BOWEL PROBLEMS  UNUSUAL RASH Items with * indicate a potential emergency and should be followed up as soon as possible.  Feel free to call the clinic you have any questions or concerns. The clinic phone number is [phone].   I have been informed and understand all the instructions given to me. I know to contact the clinic, my physician, or go to the Emergency Department if any problems should occur. I do not have any questions at this time, but understand that I may call the clinic during office hours   should I have any questions or need assistance in obtaining follow up care.    __________________________________________  _____________  __________ Signature of Patient or Authorized Representative            Date                   Time    __________________________________________ Nurse's Signature    

## 2015-01-26 ENCOUNTER — Ambulatory Visit
Admission: RE | Admit: 2015-01-26 | Discharge: 2015-01-26 | Disposition: A | Discharge status: Home / Self Care | Payer: Medicare Other | Source: Ambulatory Visit | Attending: Radiation Oncology | Admitting: Radiation Oncology

## 2015-01-26 DIAGNOSIS — C7931 Secondary malignant neoplasm of brain: Secondary | ICD-10-CM

## 2015-01-26 MED ORDER — GADOBENATE DIMEGLUMINE 529 MG/ML IV SOLN
11.0000 mL | Freq: Once | INTRAVENOUS | Status: AC | PRN
  Administered 2015-01-26: 11 mL via INTRAVENOUS

## 2015-01-27 ENCOUNTER — Encounter: Payer: Self-pay | Admitting: Radiation Oncology

## 2015-01-27 ENCOUNTER — Ambulatory Visit
Admission: RE | Admit: 2015-01-27 | Discharge: 2015-01-27 | Disposition: A | Discharge status: Home / Self Care | Payer: Medicare Other | Source: Ambulatory Visit | Attending: Radiation Oncology | Admitting: Radiation Oncology

## 2015-01-27 VITALS — BP 123/63 | HR 57 | Temp 98.0°F | Resp 16 | Wt 126.0 lb

## 2015-01-27 DIAGNOSIS — C7931 Secondary malignant neoplasm of brain: Secondary | ICD-10-CM

## 2015-01-27 NOTE — Progress Notes (Signed)
Radiation Oncology         [phone]   Name: Tamara Ortiz   Date: 01/27/2015   MRN: [id number]  DOB: 29-Aug-1941    Multidisciplinary Brain and Spine Oncology Clinic Follow-Up Visit Note  CC: [name], MD  Curt Bears, MD  No diagnosis found.  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 On 07/30/2014 she was treated to 4 targets to 20 Gy: Right frontal 5.7 mm Right frontal 5.4 mm  Right frontal 5 mm  Left frontal 2 mm   Interval Since Last Radiation:  6  months  Narrative:  The patient returns today for routine follow-up.  The recent films were presented in our multidisciplinary conference with neuroradiology just prior to the clinic.  Continues chemotherapy every four weeks. Reports that lately she has had a pain in the top of her head relieved when she vomits. Reports frequent episodes of nausea and vomiting despite taking zofran. Patient has lost 13 lb over the last four months. Reports dizziness last week. Reports blurred vision but, denies diplopia. Denies ringing in the ears. Reports intermittent middle back pain for which she takes hydrocodone acetaminophen. Vomiting varies, mostly in the mornings.                                ALLERGIES:  is allergic to amoxicillin and levaquin.  Meds: Current Outpatient Prescriptions  Medication Sig Dispense Refill  . Calcium Carbonate-Vitamin D (CALCIUM-VITAMIN D) 500-200 MG-UNIT per tablet Take 1 tablet by mouth 2 (two) times daily.      . folic acid (FOLVITE) 1 MG tablet TAKE 1 TABLET BY MOUTH DAILY 90 tablet 0  . HYDROcodone-acetaminophen (NORCO) 5-325 MG per tablet Take 1 tablet by mouth every 6 (six) hours as needed for moderate pain. 30 tablet 0  . LORazepam (ATIVAN) 0.5 MG tablet Take 1 tablet (0.5 mg total) by mouth  every 8 (eight) hours as needed for anxiety. 40 tablet 1  . losartan-hydrochlorothiazide (HYZAAR) 50-12.5 MG per tablet   3  . Multiple Vitamin (ANTIOXIDANT A/C/E PO) Take by mouth daily.      . ondansetron (ZOFRAN ODT) 8 MG disintegrating tablet Take 1 tablet (8 mg total) by mouth every 8 (eight) hours as needed for nausea or vomiting. 30 tablet 1  . zolendronic acid (ZOMETA) 4 MG/5ML injection Inject 4 mg into the vein once.      [name] zolpidem (AMBIEN CR) 6.25 MG CR tablet Take 1 tablet (6.25 mg total) by mouth at bedtime as needed for sleep. 90 tablet 1  . Alum & Mag Hydroxide-Simeth (MAGIC MOUTHWASH) SOLN Take 5 mLs by mouth 4 (four) times daily as needed for mouth pain. Swish and spit (Patient not taking: Reported on 01/25/2015) 240 mL 0  . aspirin 81 MG EC tablet Take 81 mg by mouth daily.      . polyethylene glycol powder (MIRALAX) powder Take 17 g by mouth daily.      . simvastatin (ZOCOR) 40 MG tablet Take 1 tablet (40 mg total) by mouth daily. 90 tablet 3   No current facility-administered medications for this encounter.    Physical Findings: The patient is in no acute distress. Patient is alert and oriented.  weight is 126 lb (57.153 kg). Her oral temperature is 98 F (36.7 C). Her blood pressure is 123/63  and her pulse is 57. Her respiration is 16 and oxygen saturation is 99%. .  No significant changes. Neuro non-focal  Lab Findings: Lab Results  Component Value Date   WBC 4.5 01/25/2015   HGB 12.3 01/25/2015   HCT 37.1 01/25/2015   MCV 97.1 01/25/2015   PLT 235 01/25/2015    '@LASTCHEM'$ @  Radiographic Findings: [name] Contrast  01/26/2015   CLINICAL DATA:  74 year old female with metastatic non-small cell lung cancer to the brain. Four lesions treated with stereotactic radiosurgery in October 2015. Restaging. Subsequent encounter.  EXAM: MRI HEAD WITHOUT AND WITH CONTRAST  TECHNIQUE: Multiplanar, multiecho pulse sequences of the brain and surrounding structures were  obtained without and with intravenous contrast.  CONTRAST:  77m MULTIHANCE GADOBENATE DIMEGLUMINE 529 MG/ML IV SOLN  COMPARISON:  10/27/2014 and earlier.  FINDINGS: Three of the 4 known metastases appear stable since January.  Two of these are in the right superior frontal lobe, on series 10, image 132, and a more peripheral lesion with perhaps mild dural involvement on image 117. Both of these remain larger than on the targeting study 07/10/2014.  The most anterior right superior frontal lobe lesion appears slightly larger today, now measuring up to 9 mm (8 mm previously) on series 10, image 119). The associated mild surrounding T2 and FLAIR hyperintensity is stable.  None of the other lesions demonstrate significant edema.  There is a questionable new 2-3 mm metastasis along the superficial cortex of the left operculum seen on series 10, image 76 and series 12, image 32. Alternatively this could be a small vascular structure. No associated diffusion signal changes, T2 or FLAIR abnormality.  GPearline Cablesand white matter signal elsewhere is stable. Major intracranial vascular flow voids are stable. No restricted diffusion or evidence of acute infarction. No acute intracranial hemorrhage identified. No ventriculomegaly. No intracranial mass effect. Negative pituitary, cervicomedullary junction and visualized cervical spinal cord.  Bone marrow signal remains normal. Degenerative changes in the cervical spine.  Mild paranasal sinus mucosal thickening is stable. Stable orbits. Mastoids remain clear. Visible internal auditory structures appear normal. Visualized scalp soft tissues are within normal limits.  IMPRESSION: 1. Three of the 4 known metastases are stable since January. Attention directed on followup to that which may have early dural involvement on series 10, image 117. 2. Minimally larger right superior frontal lobe lesion on image 119, but stable surrounding mild T2 and FLAIR hyperintensity. Favor treatment affect.  3. Questionable new 2-3 mm metastasis at the left operculum on image 76. Attention directed on followup.   Electronically Signed   By: HGenevie AnnM.D.   On: 01/26/2015 12:09    Impression:  Patient doing relatively well, some nausea and headaches of uncertain cause. 3 out of 4 treated regions regressed as expected. 1 region in right frontal lobe is persisting and growing larger, potential recurrent tumor versus radiation inflammation. This warrants close attention on follow up. This lesion felt to be accessible surgically if it does not improve.   Plan:  Schedule brain MRI in 2 months and assess options. We will obtain MRI with stealth protocol to permit image guided resection if needed.  This document serves as a record of services personally performed by MTyler Pita MD. It was created on his behalf by EArlyce Harman a trained medical scribe. The creation of this record is based on the scribe's personal observations and the provider's statements to them. This document has been checked and approved by the attending provider.  _____________________________________  Sheral Apley Tammi Klippel, M.D.

## 2015-01-27 NOTE — Progress Notes (Addendum)
Continues chemotherapy every four weeks. Reports that lately she has had a pain in the top of her head relieved when she vomits. Reports frequent episodes of nausea and vomiting despite taking zofran. Patient has lost 13 lb over the last four months. Reports dizziness last week. Reports blurred vision but, denies diplopia. Denies ringing in the ears. Reports intermittent middle back pain for which she takes hydrocodone acetaminophen.

## 2015-02-01 ENCOUNTER — Telehealth: Payer: Self-pay | Admitting: *Deleted

## 2015-02-01 ENCOUNTER — Other Ambulatory Visit: Payer: Self-pay

## 2015-02-01 ENCOUNTER — Other Ambulatory Visit (HOSPITAL_BASED_OUTPATIENT_CLINIC_OR_DEPARTMENT_OTHER): Payer: Medicare Other

## 2015-02-01 ENCOUNTER — Other Ambulatory Visit: Payer: Self-pay | Admitting: Nurse Practitioner

## 2015-02-01 ENCOUNTER — Ambulatory Visit (HOSPITAL_BASED_OUTPATIENT_CLINIC_OR_DEPARTMENT_OTHER): Payer: Medicare Other | Admitting: Nurse Practitioner

## 2015-02-01 ENCOUNTER — Telehealth: Payer: Self-pay | Admitting: Radiation Oncology

## 2015-02-01 ENCOUNTER — Telehealth: Payer: Self-pay | Admitting: Nurse Practitioner

## 2015-02-01 ENCOUNTER — Encounter: Payer: Self-pay | Admitting: Nurse Practitioner

## 2015-02-01 VITALS — BP 134/76 | HR 60 | Temp 98.5°F | Resp 16 | Wt 125.4 lb

## 2015-02-01 DIAGNOSIS — D701 Agranulocytosis secondary to cancer chemotherapy: Secondary | ICD-10-CM | POA: Diagnosis not present

## 2015-02-01 DIAGNOSIS — C7951 Secondary malignant neoplasm of bone: Secondary | ICD-10-CM | POA: Diagnosis not present

## 2015-02-01 DIAGNOSIS — C3412 Malignant neoplasm of upper lobe, left bronchus or lung: Secondary | ICD-10-CM

## 2015-02-01 DIAGNOSIS — C3492 Malignant neoplasm of unspecified part of left bronchus or lung: Secondary | ICD-10-CM

## 2015-02-01 DIAGNOSIS — C7931 Secondary malignant neoplasm of brain: Secondary | ICD-10-CM

## 2015-02-01 DIAGNOSIS — R112 Nausea with vomiting, unspecified: Secondary | ICD-10-CM

## 2015-02-01 DIAGNOSIS — E860 Dehydration: Secondary | ICD-10-CM

## 2015-02-01 DIAGNOSIS — T451X5A Adverse effect of antineoplastic and immunosuppressive drugs, initial encounter: Secondary | ICD-10-CM

## 2015-02-01 DIAGNOSIS — F411 Generalized anxiety disorder: Secondary | ICD-10-CM

## 2015-02-01 LAB — CBC WITH DIFFERENTIAL/PLATELET
BASO%: 0.6 % (ref 0.0–2.0)
Basophils Absolute: 0 10*3/uL (ref 0.0–0.1)
EOS%: 0.6 % (ref 0.0–7.0)
Eosinophils Absolute: 0 10*3/uL (ref 0.0–0.5)
HCT: 36.5 % (ref 34.8–46.6)
HGB: 12.5 g/dL (ref 11.6–15.9)
LYMPH%: 53.6 % — ABNORMAL HIGH (ref 14.0–49.7)
MCH: 33 pg (ref 25.1–34.0)
MCHC: 34.2 g/dL (ref 31.5–36.0)
MCV: 96.3 fL (ref 79.5–101.0)
MONO#: 0.3 10*3/uL (ref 0.1–0.9)
MONO%: 9.2 % (ref 0.0–14.0)
NEUT#: 1.2 10*3/uL — ABNORMAL LOW (ref 1.5–6.5)
NEUT%: 36 % — ABNORMAL LOW (ref 38.4–76.8)
Platelets: 180 10*3/uL (ref 145–400)
RBC: 3.79 10*6/uL (ref 3.70–5.45)
RDW: 12.3 % (ref 11.2–14.5)
WBC: 3.4 10*3/uL — ABNORMAL LOW (ref 3.9–10.3)
lymph#: 1.8 10*3/uL (ref 0.9–3.3)

## 2015-02-01 LAB — COMPREHENSIVE METABOLIC PANEL (CC13)
ALT: 21 U/L (ref 0–55)
AST: 22 U/L (ref 5–34)
Albumin: 3.7 g/dL (ref 3.5–5.0)
Alkaline Phosphatase: 68 U/L (ref 40–150)
Anion Gap: 11 mEq/L (ref 3–11)
BUN: 16.6 mg/dL (ref 7.0–26.0)
CO2: 31 mEq/L — ABNORMAL HIGH (ref 22–29)
Calcium: 10.2 mg/dL (ref 8.4–10.4)
Chloride: 97 mEq/L — ABNORMAL LOW (ref 98–109)
Creatinine: 0.9 mg/dL (ref 0.6–1.1)
EGFR: 62 mL/min/{1.73_m2} — ABNORMAL LOW (ref >90)
Glucose: 104 mg/dl (ref 70–140)
Potassium: 3.8 mEq/L (ref 3.5–5.1)
Sodium: 138 mEq/L (ref 136–145)
Total Bilirubin: 0.38 mg/dL (ref 0.20–1.20)
Total Protein: 6.5 g/dL (ref 6.4–8.3)

## 2015-02-01 MED ORDER — SODIUM CHLORIDE 0.9 % IV SOLN: Freq: Once | INTRAVENOUS | Status: DC

## 2015-02-01 MED ORDER — SODIUM CHLORIDE 0.9 % IV SOLN: INTRAVENOUS | Status: DC

## 2015-02-01 NOTE — Telephone Encounter (Signed)
Spoke with patient. She can come in today at 3:30

## 2015-02-01 NOTE — Assessment & Plan Note (Signed)
Patient continues to  experience nausea and vomiting at least once per day.  She states she has occasionally vomited up to 3 times per day.  She states that the nausea and vomiting, also sudden that she rarely has time to take any of her anti-emetics that she already has at home.  Advised patient may be a good idea to try taking the anti-emetics on a more regular basis each day to prevent any issues with nausea.  Patient feels somewhat dehydrated today; and has plans to return to the [facility] tomorrow morning for IV fluid rehydration.

## 2015-02-01 NOTE — Progress Notes (Signed)
SYMPTOM MANAGEMENT CLINIC   HPI: Tamara Ortiz 74 y.o. female diagnosed with lung cancer; with metastasis to both the brain and the bone.  Currently undergoing Alimta chemotherapy; and receives Zometa infusions on an every two-month basis.  Patient is complaining of continued, chronic nausea/vomiting.  She states she typically vomits at least once per day.  She states that the nausea and vomiting, on so suddenly that she is unable to take any of her anti-emetics to prevent the nausea and vomiting.  She feels slightly dehydrated today.  She denies any diarrhea or constipation.  She denies any recent fevers or chills.   HPI  ROS  Past Medical History  Diagnosis Date  . Hypertension   . HYPERLIPIDEMIA 11/05/2007  . ANXIETY 05/13/2009  . DEPRESSIVE DISORDER 05/06/2008  . HYPERTENSION 11/05/2007  . ALLERGIC RHINITIS 11/05/2007  . CONSTIPATION, CHRONIC 05/13/2009  . UTI 05/06/2008  . BACK PAIN 05/11/2008  . OSTEOPOROSIS 11/05/2007  . INSOMNIA-SLEEP DISORDER-UNSPEC 05/13/2009  . FATIGUE 05/13/2009  . Swelling, mass, or lump in chest 11/05/2007  . LUNG CANCER, HX OF 05/06/2008  . 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 Lung cancer   . Brain cancer     Past Surgical History  Procedure Laterality Date  . Tubal ligation    . S/p lul lung surgury  12/2007  . Lobectomy      has Non-small cell cancer of left lung; Hyperlipidemia; Anxiety state; Depression; Essential hypertension; ALLERGIC RHINITIS; CONSTIPATION, CHRONIC; Backache; OSTEOPOROSIS; Insomnia; Left knee pain; Preventative health care; Leg ulcer; Bone metastases; Diarrhea; Carpal tunnel syndrome, bilateral; Nausea with vomiting; Cancer; Dehydration; Anorexia; Weight loss; Brain metastasis; Neoplasm related pain; Neutropenia; and Chemotherapy induced neutropenia on her problem list.    is allergic to amoxicillin and levaquin.    Medication List       This list is accurate as of: 02/01/15  5:53 PM.  Always use your most recent  med list.               ANTIOXIDANT A/C/E PO  Take by mouth daily.     aspirin 81 MG EC tablet  Take 81 mg by mouth daily.     calcium-vitamin D 500-200 MG-UNIT per tablet  Take 1 tablet by mouth 2 (two) times daily.     folic acid 1 MG tablet  Commonly known as:  FOLVITE  TAKE 1 TABLET BY MOUTH DAILY     HYDROcodone-acetaminophen 5-325 MG per tablet  Commonly known as:  NORCO  Take 1 tablet by mouth every 6 (six) hours as needed for moderate pain.     LORazepam 0.5 MG tablet  Commonly known as:  ATIVAN  Take 1 tablet (0.5 mg total) by mouth every 8 (eight) hours as needed for anxiety.     losartan-hydrochlorothiazide 50-12.5 MG per tablet  Commonly known as:  HYZAAR     magic mouthwash Soln  Take 5 mLs by mouth 4 (four) times daily as needed for mouth pain. Swish and spit     MIRALAX powder  Generic drug:  polyethylene glycol powder  Take 17 g by mouth daily.     ondansetron 8 MG disintegrating tablet  Commonly known as:  ZOFRAN ODT  Take 1 tablet (8 mg total) by mouth every 8 (eight) hours as needed for nausea or vomiting.     simvastatin 40 MG tablet  Commonly known as:  ZOCOR  Take 1 tablet (40 mg total) by mouth  daily.     zolpidem 6.25 MG CR tablet  Commonly known as:  AMBIEN CR  Take 1 tablet (6.25 mg total) by mouth at bedtime as needed for sleep.     ZOMETA 4 MG/5ML injection  Generic drug:  zolendronic acid  Inject 4 mg into the vein once.         PHYSICAL EXAMINATION  Oncology Vitals 02/01/2015 01/27/2015 01/26/2015 01/25/2015 12/28/2014 12/24/2014 11/30/2014  Height - - - 165 cm 165 cm 165 cm 165 cm  Weight 56.881 kg 57.153 kg 57.153 kg 57.516 kg 58.333 kg 58.514 kg 57.063 kg  Weight (lbs) 125 lbs 6 oz 126 lbs 126 lbs 126 lbs 13 oz 128 lbs 10 oz 129 lbs 125 lbs 13 oz  BMI (kg/m2) - - - 21.1 kg/m2 21.4 kg/m2 21.47 kg/m2 20.93 kg/m2  Temp 98.5 98 - 98.4 97.7 98 98.2  Pulse 60 57 - 62 60 69 56  Resp 16 16 - _0 Resp (Historical as of 05/02/12)  - - - - - - -  SpO2 - 99 - 100 100 97 -  BSA (m2) - - - 1.62 m2 1.64 m2 1.64 m2 1.62 m2   BP Readings from Last 3 Encounters:  02/01/15 134/76  01/25/15 123/68  12/28/14 136/78    Physical Exam  Constitutional: She is oriented to person, place, and time.  Patient appears fatigued, weak, frail, and chronically ill.  HENT:  Head: Normocephalic and atraumatic.  Mouth/Throat: Oropharynx is clear and moist.  Eyes: Conjunctivae and EOM are normal. Pupils are equal, round, and reactive to light. Right eye exhibits no discharge. Left eye exhibits no discharge. No scleral icterus.  Neck: Normal range of motion.  Pulmonary/Chest: Effort normal. No respiratory distress.  Musculoskeletal: Normal range of motion.  Neurological: She is alert and oriented to person, place, and time. Gait normal.  Skin: Skin is warm and dry. There is pallor.  Psychiatric: Affect normal.  Nursing note and vitals reviewed.   LABORATORY DATA:. Appointment on 02/01/2015  Component Date Value Ref Range Status  . WBC 02/01/2015 3.4* 3.9 - 10.3 10e3/uL Final  . NEUT# 02/01/2015 1.2* 1.5 - 6.5 10e3/uL Final  . HGB 02/01/2015 12.5  11.6 - 15.9 g/dL Final  . HCT 02/01/2015 36.5  34.8 - 46.6 % Final  . Platelets 02/01/2015 180  145 - 400 10e3/uL Final  . MCV 02/01/2015 96.3  79.5 - 101.0 fL Final  . MCH 02/01/2015 33.0  25.1 - 34.0 pg Final  . MCHC 02/01/2015 34.2  31.5 - 36.0 g/dL Final  . RBC 02/01/2015 3.79  3.70 - 5.45 10e6/uL Final  . RDW 02/01/2015 12.3  11.2 - 14.5 % Final  . lymph# 02/01/2015 1.8  0.9 - 3.3 10e3/uL Final  . MONO# 02/01/2015 0.3  0.1 - 0.9 10e3/uL Final  . Eosinophils Absolute 02/01/2015 0.0  0.0 - 0.5 10e3/uL Final  . Basophils Absolute 02/01/2015 0.0  0.0 - 0.1 10e3/uL Final  . NEUT% 02/01/2015 36.0* 38.4 - 76.8 % Final  . LYMPH% 02/01/2015 53.6* 14.0 - 49.7 % Final  . MONO% 02/01/2015 9.2  0.0 - 14.0 % Final  . EOS% 02/01/2015 0.6  0.0 - 7.0 % Final  . BASO% 02/01/2015 0.6  0.0 - 2.0 %  Final  . Sodium 02/01/2015 138  136 - 145 mEq/L Final  . Potassium 02/01/2015 3.8  3.5 - 5.1 mEq/L Final  . Chloride 02/01/2015 97* 98 - 109 mEq/L Final  . CO2 02/01/2015  31* 22 - 29 mEq/L Final  . Glucose 02/01/2015 104  70 - 140 mg/dl Final  . BUN 02/01/2015 16.6  7.0 - 26.0 mg/dL Final  . Creatinine 02/01/2015 0.9  0.6 - 1.1 mg/dL Final  . Total Bilirubin 02/01/2015 0.38  0.20 - 1.20 mg/dL Final  . Alkaline Phosphatase 02/01/2015 68  40 - 150 U/L Final  . AST 02/01/2015 22  5 - 34 U/L Final  . ALT 02/01/2015 21  0 - 55 U/L Final  . Total Protein 02/01/2015 6.5  6.4 - 8.3 g/dL Final  . Albumin 02/01/2015 3.7  3.5 - 5.0 g/dL Final  . Calcium 02/01/2015 10.2  8.4 - 10.4 mg/dL Final  . Anion Gap 02/01/2015 11  3 - 11 mEq/L Final  . EGFR 02/01/2015 62* >90 ml/min/1.73 m2 Final   eGFR is calculated using the CKD-EPI Creatinine Equation (2009)     RADIOGRAPHIC STUDIES: No results found.  ASSESSMENT/PLAN:    Non-small cell cancer of left lung Patient received her last cycle of Alimta chemotherapy on 01/25/2015.  She is scheduled for her next chemotherapy on 02/22/2015.  Patient also receives Zometa on an every two-month basis.  She is scheduled for her next Zometa around 02/27/2015.   Anxiety state Patient does admit to increased anxiety recently regarding her upcoming brain MRI.  She states she has been informed that she may undergo brain surgery if there are any progressive brain lesions.  Patient was once again advised that the [facility] are available for counseling on an as-needed basis at any time.   Nausea with vomiting Patient continues to  experience nausea and vomiting at least once per day.  She states she has occasionally vomited up to 3 times per day.  She states that the nausea and vomiting, also sudden that she rarely has time to take any of her anti-emetics that she already has at home.  Advised patient may be a good idea to try taking the  anti-emetics on a more regular basis each day to prevent any issues with nausea.  Patient feels somewhat dehydrated today; and has plans to [facility] tomorrow morning for IV fluid rehydration.   Dehydration Patient continues to  experience nausea and vomiting at least once per day.  She states she has occasionally vomited up to 3 times per day.  She states that the nausea and vomiting, also sudden that she rarely has time to take any of her anti-emetics that she already has at home.  Advised patient may be a good idea to try taking the anti-emetics on a more regular basis each day to prevent any issues with nausea.  Patient feels somewhat dehydrated today; and has plans to return to [facility] tomorrow morning for IV fluid rehydration.    Chemotherapy induced neutropenia CBC is 3.4 and ANC is 1.2.  Briefly reviewed all neutropenia guidelines with patient again today.  Patient was afebrile while at the [facility] today.   Patient stated understanding of all instructions; and was in agreement with this plan of care. The patient knows to call the clinic with any problems, questions or concerns.   Review/collaboration with Dr. Julien Nordmann regarding all aspects of patient's visit today.   Total time spent with patient was 25 minutes;  with greater than 75 percent of that time spent in face to face counseling regarding patient's symptoms,  and coordination of care and follow up.  Disclaimer: This note was dictated with  voice recognition software. Similar sounding words can inadvertently be transcribed and may not be corrected upon review.   , , NP 02/01/2015    

## 2015-02-01 NOTE — Telephone Encounter (Signed)
Patient left message on this RN's voicemail this morning. Returned call. Patient questions if Dr. Tammi Klippel meant "real surgery" during their follow up encounter. Explained, yes if the MRI shows changes he recommends surgery under the guidance of a neurosurgeon. Strongly encouraged that the patient take one day at a time and wait for results of MRI before becoming anxious about a surgery that may not happen. Listened and attempted to comfort the patient. Normalized her feelings. Understands to contact this RN with future needs or concerns. She verbalized understanding and expressed appreciation for the call.

## 2015-02-01 NOTE — Assessment & Plan Note (Addendum)
Patient continues to  experience nausea and vomiting at least once per day.  She states she has occasionally vomited up to 3 times per day.  She states that the nausea and vomiting, also sudden that she rarely has time to take any of her anti-emetics that she already has at home.  Advised patient may be a good idea to try taking the anti-emetics on a more regular basis each day to prevent any issues with nausea.  Patient feels somewhat dehydrated today; and has plans to [facility] morning for IV fluid rehydration.

## 2015-02-01 NOTE — Assessment & Plan Note (Signed)
Patient received her last cycle of Alimta chemotherapy on 01/25/2015.  She is scheduled for her next chemotherapy on 02/22/2015.  Patient also receives Zometa on an every two-month basis.  She is scheduled for her next Zometa around 02/27/2015.

## 2015-02-01 NOTE — Telephone Encounter (Signed)
Pt left message stating she has been vomiting bile, not food. Does not vomit everyday or every week, but is concerned she might have a blockage, ulcer or gallstones. Vomited 2 times yesterday. Feels like abdomen needs to be checked.  Will forward to Dr Julien Nordmann and his nurse. Left message for patient.

## 2015-02-01 NOTE — Telephone Encounter (Signed)
Pt added to NP/CB schedule pt is aware for 3:30 per 05/02 POF.... Cherylann Banas

## 2015-02-01 NOTE — Assessment & Plan Note (Signed)
Patient does admit to increased anxiety recently regarding her upcoming brain MRI.  She states she has been informed that she may undergo brain surgery if there are any progressive brain lesions.  Patient was once again advised that the [facility] are available for counseling on an as-needed basis at any time.

## 2015-02-01 NOTE — Assessment & Plan Note (Signed)
CBC is 3.4 and ANC is 1.2.  Briefly reviewed all neutropenia guidelines with patient again today.  Patient was afebrile while at the [facility] today.

## 2015-02-02 ENCOUNTER — Telehealth: Payer: Self-pay | Admitting: *Deleted

## 2015-02-02 ENCOUNTER — Other Ambulatory Visit: Payer: Self-pay | Admitting: *Deleted

## 2015-02-02 ENCOUNTER — Ambulatory Visit (HOSPITAL_COMMUNITY): Payer: Medicare Other

## 2015-02-02 ENCOUNTER — Encounter: Payer: Self-pay | Admitting: Internal Medicine

## 2015-02-02 NOTE — Telephone Encounter (Signed)
Called pt lmovm addressing her email concerns.  Per MD, Nausea could be related to chemo but also most likely got worse after the brain metastases and radiation.  Changing to Tarceva is still a good option and can be discussed at next visit. Request pt call with any additional concerns.

## 2015-02-02 NOTE — Telephone Encounter (Signed)
Nausea could be related to chemo but also most likely got worse after the brain metastases and radiation. Changing to Tarceva is still a good option.    We can discuss it next visit.    Spoke with patient- relayed above message from Dr Julien Nordmann. Pt to call if has any problems prior to next visit

## 2015-02-03 ENCOUNTER — Telehealth: Payer: Self-pay | Admitting: Internal Medicine

## 2015-02-03 NOTE — Telephone Encounter (Signed)
Per desk nurse response to 5/4 staff message re appointments for 5/11 disregard 5/3 pof - no appointments needed 5/11.

## 2015-02-08 ENCOUNTER — Telehealth: Payer: Self-pay

## 2015-02-08 NOTE — Telephone Encounter (Signed)
Pt called stating she used magnesium citrate on Friday for BM. She has not had BM since and is feeling very bloated.

## 2015-02-08 NOTE — Telephone Encounter (Signed)
Constipation has been an ongoing problem but not this bad. She uses miralax and colace once a day. She takes pain pill about 3 am most days b/c her back hurts and cannot go back to sleep. No nausea, no vomiting, no fever. She did eat 3 plates of food at [employer] on Saturday. Discussed eating more evenly through the days. Discussed using colace miralax a little more , 2 x/day or 2x/day every other day.  Call back if that does not help.

## 2015-02-22 ENCOUNTER — Encounter: Payer: Self-pay | Admitting: Physician Assistant

## 2015-02-22 ENCOUNTER — Other Ambulatory Visit: Payer: Self-pay | Admitting: Radiation Therapy

## 2015-02-22 ENCOUNTER — Ambulatory Visit (HOSPITAL_BASED_OUTPATIENT_CLINIC_OR_DEPARTMENT_OTHER): Payer: Medicare Other | Admitting: Physician Assistant

## 2015-02-22 ENCOUNTER — Telehealth: Payer: Self-pay | Admitting: Physician Assistant

## 2015-02-22 ENCOUNTER — Other Ambulatory Visit (HOSPITAL_BASED_OUTPATIENT_CLINIC_OR_DEPARTMENT_OTHER): Payer: Medicare Other

## 2015-02-22 ENCOUNTER — Ambulatory Visit (HOSPITAL_BASED_OUTPATIENT_CLINIC_OR_DEPARTMENT_OTHER): Payer: Medicare Other

## 2015-02-22 ENCOUNTER — Telehealth: Payer: Self-pay | Admitting: *Deleted

## 2015-02-22 VITALS — BP 128/68 | HR 60 | Temp 98.9°F | Resp 18 | Ht 65.0 in | Wt 125.7 lb

## 2015-02-22 DIAGNOSIS — C7931 Secondary malignant neoplasm of brain: Secondary | ICD-10-CM

## 2015-02-22 DIAGNOSIS — C7951 Secondary malignant neoplasm of bone: Secondary | ICD-10-CM

## 2015-02-22 DIAGNOSIS — C801 Malignant (primary) neoplasm, unspecified: Secondary | ICD-10-CM

## 2015-02-22 DIAGNOSIS — C3412 Malignant neoplasm of upper lobe, left bronchus or lung: Secondary | ICD-10-CM

## 2015-02-22 DIAGNOSIS — R110 Nausea: Secondary | ICD-10-CM

## 2015-02-22 DIAGNOSIS — Z5111 Encounter for antineoplastic chemotherapy: Secondary | ICD-10-CM | POA: Diagnosis present

## 2015-02-22 DIAGNOSIS — C3492 Malignant neoplasm of unspecified part of left bronchus or lung: Secondary | ICD-10-CM

## 2015-02-22 DIAGNOSIS — C3490 Malignant neoplasm of unspecified part of unspecified bronchus or lung: Secondary | ICD-10-CM

## 2015-02-22 DIAGNOSIS — M545 Low back pain: Secondary | ICD-10-CM | POA: Diagnosis not present

## 2015-02-22 LAB — CBC WITH DIFFERENTIAL/PLATELET
BASO%: 0.9 % (ref 0.0–2.0)
BASOS ABS: 0 10*3/uL (ref 0.0–0.1)
EOS ABS: 0 10*3/uL (ref 0.0–0.5)
EOS%: 0.3 % (ref 0.0–7.0)
HCT: 39.8 % (ref 34.8–46.6)
HEMOGLOBIN: 13.1 g/dL (ref 11.6–15.9)
LYMPH%: 19.4 % (ref 14.0–49.7)
MCH: 32.4 pg (ref 25.1–34.0)
MCHC: 32.8 g/dL (ref 31.5–36.0)
MCV: 98.6 fL (ref 79.5–101.0)
MONO#: 0.4 10*3/uL (ref 0.1–0.9)
MONO%: 6.9 % (ref 0.0–14.0)
NEUT%: 72.5 % (ref 38.4–76.8)
NEUTROS ABS: 3.8 10*3/uL (ref 1.5–6.5)
Platelets: 260 10*3/uL (ref 145–400)
RBC: 4.04 10*6/uL (ref 3.70–5.45)
RDW: 13.2 % (ref 11.2–14.5)
WBC: 5.2 10*3/uL (ref 3.9–10.3)
lymph#: 1 10*3/uL (ref 0.9–3.3)

## 2015-02-22 LAB — COMPREHENSIVE METABOLIC PANEL (CC13)
ALT: 14 U/L (ref 0–55)
AST: 21 U/L (ref 5–34)
Albumin: 3.8 g/dL (ref 3.5–5.0)
Alkaline Phosphatase: 65 U/L (ref 40–150)
Anion Gap: 13 mEq/L — ABNORMAL HIGH (ref 3–11)
BUN: 11.8 mg/dL (ref 7.0–26.0)
CALCIUM: 9.5 mg/dL (ref 8.4–10.4)
CO2: 26 mEq/L (ref 22–29)
Chloride: 102 mEq/L (ref 98–109)
Creatinine: 1 mg/dL (ref 0.6–1.1)
EGFR: 54 mL/min/{1.73_m2} — ABNORMAL LOW (ref >90)
GLUCOSE: 86 mg/dL (ref 70–140)
Potassium: 4 mEq/L (ref 3.5–5.1)
SODIUM: 141 meq/L (ref 136–145)
Total Bilirubin: 0.5 mg/dL (ref 0.20–1.20)
Total Protein: 6.9 g/dL (ref 6.4–8.3)

## 2015-02-22 MED ORDER — SODIUM CHLORIDE 0.9 % IV SOLN
Freq: Once | INTRAVENOUS | Status: AC
  Administered 2015-02-22: 12:00:00 via INTRAVENOUS

## 2015-02-22 MED ORDER — ZOLEDRONIC ACID 4 MG/100ML IV SOLN
4.0000 mg | Freq: Once | INTRAVENOUS | Status: AC
  Administered 2015-02-22: 4 mg via INTRAVENOUS
  Filled 2015-02-22: qty 100

## 2015-02-22 MED ORDER — SODIUM CHLORIDE 0.9 % IV SOLN
Freq: Once | INTRAVENOUS | Status: AC
  Administered 2015-02-22: 12:00:00 via INTRAVENOUS
  Filled 2015-02-22: qty 4

## 2015-02-22 MED ORDER — LORAZEPAM 0.5 MG PO TABS: 0.5000 mg | ORAL_TABLET | Freq: Three times a day (TID) | ORAL | Status: DC | PRN

## 2015-02-22 MED ORDER — SODIUM CHLORIDE 0.9 % IV SOLN
400.0000 mg/m2 | Freq: Once | INTRAVENOUS | Status: AC
  Administered 2015-02-22: 700 mg via INTRAVENOUS
  Filled 2015-02-22: qty 28

## 2015-02-22 NOTE — Progress Notes (Addendum)
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   PRIOR THERAPY:  1. Status post left upper lobectomy with mediastinal lymph node dissection under the care of Dr. Roxan Hockey on December 10, 2007.  2. Status post 4 cycles of adjuvant chemotherapy with cisplatin and Taxotere. Last dose was given 03/22/2008.  3. Status post 6 cycles of systemic chemotherapy with carboplatin and Alimta for metastatic disease in the bone. The last dose was given July 02, 2010, with stable disease.  4. Status post 12 cycles of maintenance Alimta at 500 mg/sq m given every 3 weeks. The last dose was given 05/08/2011. 5. Stereotactic radiotherapy to metastatic brain lesions under the care of Dr. Tammi Klippel completed on 07/30/2014.  CURRENT THERAPY:  1. Maintenance Alimta at 500 mg/sq m given every 4 weeks. The patient is status post 45 cycles.  2. Zometa 4 mg IV given every 2 months for bone metastasis.   CHEMOTHERAPY INTENT: Palliative  CURRENT # OF CHEMOTHERAPY CYCLES: 46 CURRENT ANTIEMETICS: none  CURRENT SMOKING STATUS: Never smoker   ORAL CHEMOTHERAPY AND CONSENT: n/a  CURRENT BISPHOSPHONATES USE: Yes, Zometa given every 2 months  PAIN MANAGEMENT: Norco  NARCOTICS INDUCED CONSTIPATION: Occasional, uses MiraLax  LIVING WILL AND CODE STATUS: Has a living will   INTERVAL HISTORY: Tamara Ortiz 74 y.o. female returns to the clinic today for followup visit. The patient is feeling fine today with no specific complaints except for increasing fatigue recently. She also reports some vomiting, diaarrhea and constipation. She denied having any significant weight loss or night sweats. She has  no chest pain, shortness of breath, cough or hemoptysis. She denied having any significant nausea or vomiting but she noticed black tarry stool. She has no fever or chills. She is tolerating her treatment with Alimta fairly well with no significant adverse effects. She is here today to start cycle #46 of her treatment.  MEDICAL HISTORY: Past Medical History  Diagnosis Date  . Hypertension   . HYPERLIPIDEMIA 11/05/2007  . ANXIETY 05/13/2009  . DEPRESSIVE DISORDER 05/06/2008  . HYPERTENSION 11/05/2007  . ALLERGIC RHINITIS 11/05/2007  . CONSTIPATION, CHRONIC 05/13/2009  . UTI 05/06/2008  . BACK PAIN 05/11/2008  . OSTEOPOROSIS 11/05/2007  . INSOMNIA-SLEEP DISORDER-UNSPEC 05/13/2009  . FATIGUE 05/13/2009  . Swelling, mass, or lump in chest 11/05/2007  . LUNG CANCER, HX OF 05/06/2008  . 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 Lung cancer   . Brain cancer     ALLERGIES:  is allergic to amoxicillin and levaquin.  MEDICATIONS:  Current Outpatient Prescriptions  Medication Sig Dispense Refill  . Alum & Mag Hydroxide-Simeth (MAGIC MOUTHWASH) SOLN Take 5 mLs by mouth 4 (four) times daily as needed for mouth pain. Swish and spit 240 mL 0  . aspirin 81 MG EC tablet Take 81 mg by mouth daily.      . Calcium Carbonate-Vitamin D (CALCIUM-VITAMIN D) 500-200 MG-UNIT per tablet Take 1 tablet by mouth 2 (two) times daily.      . folic acid (FOLVITE) 1 MG tablet TAKE 1 TABLET BY MOUTH DAILY 90 tablet 0  . HYDROcodone-acetaminophen (NORCO) 5-325 MG per tablet  Take 1 tablet by mouth every 6 (six) hours as needed for moderate pain. 30 tablet 0  . LORazepam (ATIVAN) 0.5 MG tablet Take 1 tablet (0.5 mg total) by mouth every 8 (eight) hours as needed for anxiety. 40 tablet 0  . losartan-hydrochlorothiazide (HYZAAR) 50-12.5 MG per tablet   3  . Multiple Vitamin (ANTIOXIDANT A/C/E PO) Take by mouth daily.      . ondansetron (ZOFRAN ODT) 8 MG disintegrating tablet Take 1 tablet (8 mg total) by mouth every 8  (eight) hours as needed for nausea or vomiting. 30 tablet 1  . polyethylene glycol powder (MIRALAX) powder Take 17 g by mouth daily.      [name] zolendronic acid (ZOMETA) 4 MG/5ML injection Inject 4 mg into the vein once.      [name] zolpidem (AMBIEN CR) 6.25 MG CR tablet Take 1 tablet (6.25 mg total) by mouth at bedtime as needed for sleep. 90 tablet 1  . simvastatin (ZOCOR) 40 MG tablet Take 1 tablet (40 mg total) by mouth daily. 90 tablet 3   No current facility-administered medications for this visit.    SURGICAL HISTORY:  Past Surgical History  Procedure Laterality Date  . Tubal ligation    . S/p lul lung surgury  12/2007  . Lobectomy      REVIEW OF SYSTEMS:  Constitutional: positive for fatigue Eyes: negative Ears, nose, mouth, throat, and face: negative Respiratory: negative Cardiovascular: negative Gastrointestinal: positive for constipation, diarrhea, nausea and vomiting Genitourinary:negative Integument/breast: negative Hematologic/lymphatic: negative Musculoskeletal:positive for arthralgias and back pain Neurological: negative Behavioral/Psych: negative Endocrine: negative Allergic/Immunologic: negative   PHYSICAL EXAMINATION: General appearance: alert, cooperative and no distress Head: Normocephalic, without obvious abnormality, atraumatic Neck: no adenopathy Lymph nodes: Cervical, supraclavicular, and axillary nodes normal. Resp: clear to auscultation bilaterally Back: symmetric, no curvature. ROM normal. No CVA tenderness. Cardio: regular rate and rhythm, S1, S2 normal, no murmur, click, rub or gallop GI: soft, non-tender; bowel sounds normal; no masses,  no organomegaly Extremities: extremities normal, atraumatic, no cyanosis or edema Neurologic: Alert and oriented X 3, normal strength and tone. Normal symmetric reflexes. Normal coordination and gait  ECOG PERFORMANCE STATUS: 1 - Symptomatic but completely ambulatory  Blood pressure 128/68, pulse 60, temperature 98.9  F (37.2 C), temperature source Oral, resp. rate 18, height '5\' 5"'  (1.651 m), weight 125 lb 11.2 oz (57.017 kg), SpO2 100 %.  LABORATORY DATA: Lab Results  Component Value Date   WBC 5.2 02/22/2015   HGB 13.1 02/22/2015   HCT 39.8 02/22/2015   MCV 98.6 02/22/2015   PLT 260 02/22/2015      Chemistry      Component Value Date/Time   NA 141 02/22/2015 0952   NA 137 08/12/2014 1716   NA 140 04/17/2012 0855   K 4.0 02/22/2015 0952   K 3.7 08/12/2014 1716   K 4.1 04/17/2012 0855   CL 99 08/12/2014 1716   CL 100 03/04/2013 0901   CL 99 04/17/2012 0855   CO2 26 02/22/2015 0952   CO2 35* 08/12/2014 1716   CO2 29 04/17/2012 0855   BUN 11.8 02/22/2015 0952   BUN 16 08/12/2014 1716   BUN 15 04/17/2012 0855   CREATININE 1.0 02/22/2015 0952   CREATININE 1.1 08/12/2014 1716   CREATININE 1.4* 04/17/2012 0855      Component Value Date/Time   CALCIUM 9.5 02/22/2015 0952   CALCIUM 10.6* 08/12/2014 1716   CALCIUM 9.5 04/17/2012 0855   ALKPHOS 65 02/22/2015 0952   ALKPHOS 79  08/12/2014 1716   ALKPHOS 68 04/17/2012 0855   AST 21 02/22/2015 0952   AST 28 08/12/2014 1716   AST 29 04/17/2012 0855   ALT 14 02/22/2015 0952   ALT 24 08/12/2014 1716   ALT 31 04/17/2012 0855   BILITOT 0.50 02/22/2015 0952   BILITOT 0.4 08/12/2014 1716   BILITOT 0.60 04/17/2012 0855       RADIOGRAPHIC STUDIES: Mr Jeri Cos Wo Contrast  2015-02-02   CLINICAL DATA:  74 year old female with metastatic non-small cell lung cancer to the brain. Four lesions treated with stereotactic radiosurgery in October 2015. Restaging. Subsequent encounter.  EXAM: MRI HEAD WITHOUT AND WITH CONTRAST  TECHNIQUE: Multiplanar, multiecho pulse sequences of the brain and surrounding structures were obtained without and with intravenous contrast.  CONTRAST:  64m MULTIHANCE GADOBENATE DIMEGLUMINE 529 MG/ML IV SOLN  COMPARISON:  10/27/2014 and earlier.  FINDINGS: Three of the 4 known metastases appear stable since January.  Two of these  are in the right superior frontal lobe, on series 10, image 132, and a more peripheral lesion with perhaps mild dural involvement on image 117. Both of these remain larger than on the targeting study 07/10/2014.  The most anterior right superior frontal lobe lesion appears slightly larger today, now measuring up to 9 mm (8 mm previously) on series 10, image 119). The associated mild surrounding T2 and FLAIR hyperintensity is stable.  None of the other lesions demonstrate significant edema.  There is a questionable new 2-3 mm metastasis along the superficial cortex of the left operculum seen on series 10, image 76 and series 12, image 32. Alternatively this could be a small vascular structure. No associated diffusion signal changes, T2 or FLAIR abnormality.  GPearline Cablesand white matter signal elsewhere is stable. Major intracranial vascular flow voids are stable. No restricted diffusion or evidence of acute infarction. No acute intracranial hemorrhage identified. No ventriculomegaly. No intracranial mass effect. Negative pituitary, cervicomedullary junction and visualized cervical spinal cord.  Bone marrow signal remains normal. Degenerative changes in the cervical spine.  Mild paranasal sinus mucosal thickening is stable. Stable orbits. Mastoids remain clear. Visible internal auditory structures appear normal. Visualized scalp soft tissues are within normal limits.  IMPRESSION: 1. Three of the 4 known metastases are stable since January. Attention directed on followup to that which may have early dural involvement on series 10, image 117. 2. Minimally larger right superior frontal lobe lesion on image 119, but stable surrounding mild T2 and FLAIR hyperintensity. Favor treatment affect. 3. Questionable new 2-3 mm metastasis at the left operculum on image 76. Attention directed on followup.   Electronically Signed   By: HGenevie AnnM.D.   On: 003-May-[id number]    ASSESSMENT AND PLAN: This is a very pleasant 74years old white  female with recurrent non-small cell lung cancer, adenocarcinoma. The patient is currently on maintenance Alimta monthly status post total of 45 cycles.  The patient is feeling fine and tolerating the treatment well. For the nausea, she will continue Zofran and we will add back the Ativan for nausea as well. The patient was discussed with and also seen by Dr. MJulien Nordmann She will proceed with cycle #46 today as scheduled. She will follow up in one month with a restaging CT scan of the chest, abdomen and pelvis to re-evaluate her disease. For the low back pain, the patient will continue on Vicodin on as-needed basis  The patient was advised to call immediately if she has any concerning symptoms in the  interval.  The patient voices understanding of current disease status and treatment options and is in agreement with the current care plan.  All questions were answered. The patient knows to call the clinic with any problems, questions or concerns. We can certainly see the patient much sooner if necessary.  Carlton Adam PA-C  ADDENDUM: Hematology/Oncology Attending: I had a face to face encounter with the patient. I recommended her care plan. This is a very pleasant 74 years old white female with metastatic non-small cell lung cancer, adenocarcinoma. She is currently undergoing treatment with maintenance Alimta every 4 weeks, status post 46 cycles and tolerating her treatment fairly well except for intermittent nausea. Her most recent MRI of the brain showed no significant evidence for disease progression. I recommended for the patient to proceed with cycle #46 today as a scheduled. She would come back for follow-up visit in one month's for reevaluation after repeating CT scan of the chest, abdomen and pelvis for restaging of her disease. For the nausea the patient will continue on Zofran in addition to Ativan when necessary. She was advised to call immediately if she has any concerning symptoms in  the interval.  Disclaimer: This note was dictated with voice recognition software. Similar sounding words can inadvertently be transcribed and may be missed upon review. Eilleen Kempf., MD 03/01/2015

## 2015-02-22 NOTE — Telephone Encounter (Signed)
gave pt contrast for CT-adv [facility] will call to sch

## 2015-02-22 NOTE — Telephone Encounter (Signed)
Per staff message and POF I have scheduled appts. Advised scheduler of appts. [name]  

## 2015-02-22 NOTE — Telephone Encounter (Signed)
per pof to sch pt appt-sent MW to sch pt trmt-gave pt copy of sch

## 2015-02-22 NOTE — Patient Instructions (Signed)
[facility] Discharge Instructions for Patients Receiving Chemotherapy  Today you received the following chemotherapy agents Alimta  To help prevent nausea and vomiting after your treatment, we encourage you to take your nausea medication   If you develop nausea and vomiting that is not controlled by your nausea medication, call the clinic.   BELOW ARE SYMPTOMS THAT SHOULD BE REPORTED IMMEDIATELY:  *FEVER GREATER THAN 100.5 F  *CHILLS WITH OR WITHOUT FEVER  NAUSEA AND VOMITING THAT IS NOT CONTROLLED WITH YOUR NAUSEA MEDICATION  *UNUSUAL SHORTNESS OF BREATH  *UNUSUAL BRUISING OR BLEEDING  TENDERNESS IN MOUTH AND THROAT WITH OR WITHOUT PRESENCE OF ULCERS  *URINARY PROBLEMS  *BOWEL PROBLEMS  UNUSUAL RASH Items with * indicate a potential emergency and should be followed up as soon as possible.  Feel free to call the clinic you have any questions or concerns. The clinic phone number is [phone].  Please show the CHEMO ALERT CARD at check-in to the Emergency Department and triage nurse.   

## 2015-02-22 NOTE — Telephone Encounter (Signed)
per pof to sch pt appt-sent MW email to sch pt trmt-pt aware °

## 2015-02-23 ENCOUNTER — Telehealth: Payer: Self-pay | Admitting: Internal Medicine

## 2015-02-23 NOTE — Telephone Encounter (Signed)
Pt is on kemo and has lost weight and wanted to if Dr Jenny Reichmann wanted to change her dosage of her meds.  She has lost 15 pound in the last 4 months  Best number [phone]

## 2015-02-23 NOTE — Telephone Encounter (Signed)
Pt called requesting MD review her medications - Simvastatin and Losartan. She says that she has been experiencing dizziness when taking entire tablet so she has been taking 1/2. She believes this is due to weight loss, please advise

## 2015-02-23 NOTE — Telephone Encounter (Signed)
BP Readings from Last 3 Encounters:  02/22/15 128/68  02/01/15 134/76  01/25/15 123/68   Last BP x 3 have been ok, although dehydration was mentioned at last visit to oncology, which seems more likely the main reason for dizziness.  OK to stop the losartan for now though, cont to monitor BP and any further wt loss  OK to stop the simvastatin as well, just in case this is related as well.

## 2015-02-23 NOTE — Telephone Encounter (Signed)
Pt advised to D/C medications

## 2015-02-26 NOTE — Patient Instructions (Signed)
Follow up in one month with a restaging CT scan of your chest, abdomen and pelvis to re-evaluate your disease

## 2015-03-02 ENCOUNTER — Telehealth: Payer: Self-pay | Admitting: *Deleted

## 2015-03-02 ENCOUNTER — Ambulatory Visit (HOSPITAL_BASED_OUTPATIENT_CLINIC_OR_DEPARTMENT_OTHER): Payer: Medicare Other

## 2015-03-02 ENCOUNTER — Telehealth: Payer: Self-pay | Admitting: Nurse Practitioner

## 2015-03-02 ENCOUNTER — Encounter: Payer: Medicare Other | Admitting: Nurse Practitioner

## 2015-03-02 ENCOUNTER — Other Ambulatory Visit: Payer: Self-pay | Admitting: *Deleted

## 2015-03-02 ENCOUNTER — Other Ambulatory Visit: Payer: Self-pay | Admitting: Medical Oncology

## 2015-03-02 VITALS — BP 135/71 | HR 63 | Temp 98.3°F | Resp 20

## 2015-03-02 DIAGNOSIS — C3492 Malignant neoplasm of unspecified part of left bronchus or lung: Secondary | ICD-10-CM

## 2015-03-02 DIAGNOSIS — C3412 Malignant neoplasm of upper lobe, left bronchus or lung: Secondary | ICD-10-CM

## 2015-03-02 DIAGNOSIS — R1111 Vomiting without nausea: Secondary | ICD-10-CM

## 2015-03-02 DIAGNOSIS — C7951 Secondary malignant neoplasm of bone: Secondary | ICD-10-CM

## 2015-03-02 MED ORDER — SODIUM CHLORIDE 0.9 % IV SOLN
INTRAVENOUS | Status: AC
  Administered 2015-03-02: 16:00:00 via INTRAVENOUS

## 2015-03-02 MED ORDER — SODIUM CHLORIDE 0.9 % IV SOLN
Freq: Once | INTRAVENOUS | Status: AC
  Administered 2015-03-02: 16:00:00 via INTRAVENOUS
  Filled 2015-03-02: qty 4

## 2015-03-02 MED ORDER — HYDROCODONE-ACETAMINOPHEN 5-325 MG PO TABS: 1.0000 | ORAL_TABLET | Freq: Four times a day (QID) | ORAL | Status: DC | PRN

## 2015-03-02 NOTE — Telephone Encounter (Signed)
Voicemail from patient asking if she is "to continue folic acid and needs refill on Hydrocodone 5-325 mg.  Call me and i will pick up the hydrocodone prescription.  Return number [phone]."  Alimta treatment received 02-22-2015 and needs to continue folic acid.  Pain medicine was ordered last on 01-25-2015.

## 2015-03-02 NOTE — Patient Instructions (Signed)
Dehydration, Adult Dehydration is when you lose more fluids from the body than you take in. Vital organs like the kidneys, brain, and heart cannot function without a proper amount of fluids and salt. Any loss of fluids from the body can cause dehydration.  CAUSES   Vomiting.  Diarrhea.  Excessive sweating.  Excessive urine output.  Fever. SYMPTOMS  Mild dehydration  Thirst.  Dry lips.  Slightly dry mouth. Moderate dehydration  Very dry mouth.  Sunken eyes.  Skin does not bounce back quickly when lightly pinched and released.  Dark urine and decreased urine production.  Decreased tear production.  Headache. Severe dehydration  Very dry mouth.  Extreme thirst.  Rapid, weak pulse (more than 100 beats per minute at rest).  Cold hands and feet.  Not able to sweat in spite of heat and temperature.  Rapid breathing.  Blue lips.  Confusion and lethargy.  Difficulty being awakened.  Minimal urine production.  No tears. DIAGNOSIS  Your caregiver will diagnose dehydration based on your symptoms and your exam. Blood and urine tests will help confirm the diagnosis. The diagnostic evaluation should also identify the cause of dehydration. TREATMENT  Treatment of mild or moderate dehydration can often be done at home by increasing the amount of fluids that you drink. It is best to drink small amounts of fluid more often. Drinking too much at one time can make vomiting worse. Refer to the home care instructions below. Severe dehydration needs to be treated at the hospital where you will probably be given intravenous (IV) fluids that contain water and electrolytes. HOME CARE INSTRUCTIONS   Ask your caregiver about specific rehydration instructions.  Drink enough fluids to keep your urine clear or pale yellow.  Drink small amounts frequently if you have nausea and vomiting.  Eat as you normally do.  Avoid:  Foods or drinks high in sugar.  Carbonated  drinks.  Juice.  Extremely hot or cold fluids.  Drinks with caffeine.  Fatty, greasy foods.  Alcohol.  Tobacco.  Overeating.  Gelatin desserts.  Wash your hands well to avoid spreading bacteria and viruses.  Only take over-the-counter or prescription medicines for pain, discomfort, or fever as directed by your caregiver.  Ask your caregiver if you should continue all prescribed and over-the-counter medicines.  Keep all follow-up appointments with your caregiver. SEEK MEDICAL CARE IF:  You have abdominal pain and it increases or stays in one area (localizes).  You have a rash, stiff neck, or severe headache.  You are irritable, sleepy, or difficult to awaken.  You are weak, dizzy, or extremely thirsty. SEEK IMMEDIATE MEDICAL CARE IF:   You are unable to keep fluids down or you get worse despite treatment.  You have frequent episodes of vomiting or diarrhea.  You have blood or green matter (bile) in your vomit.  You have blood in your stool or your stool looks black and tarry.  You have not urinated in 6 to 8 hours, or you have only urinated a small amount of very dark urine.  You have a fever.  You faint. MAKE SURE YOU:   Understand these instructions.  Will watch your condition.  Will get help right away if you are not doing well or get worse. Document Released: 09/18/2005 Document Revised: 12/11/2011 Document Reviewed: 05/08/2011 ExitCare Patient Information 2015 [employer]. This information is not intended to replace advice given to you by your health care provider. Make sure you discuss any questions you have with your health care   provider.  

## 2015-03-02 NOTE — Telephone Encounter (Signed)
Per 05/31 POF add to CB/NP schedule, s/w CB pt was not seen just check in on pt for a second... KJ

## 2015-03-02 NOTE — Telephone Encounter (Signed)
Pt notified top pick up rx.

## 2015-03-02 NOTE — Telephone Encounter (Signed)
PT. HAS HAD NAUSEA AND VOMITING SINCE THIS MORNING. SHE HAS TAKEN ZOFRAN ODT X1. FLUID INTAKE HAS BEEN 20 OUNCES. PT. IS VERY WEAK. NO FEVER BUT "SWEATS" LAST NIGHT. NO SYMPTOMS OF A RESPIRATORY OR URINARY TRACT INFECTION. SHE IS HAVING SOME VAGINAL ITCHING. PT. FEELS SHE NEEDS SOME FLUIDS. INSTRUCTED PT. TO TRY THE ATIVAN FOR HER NAUSEA AND VOMITING. VERBAL ORDER AND READ BACK TO CYNDEE BACON,NP- HAVE PT. COME FOR FLUIDS AND WILL SEE PT. IN THE INFUSION ROOM. NOTIFIED PT. TO COME TO THE OFFICE NOW.

## 2015-03-03 ENCOUNTER — Other Ambulatory Visit: Payer: Self-pay | Admitting: Internal Medicine

## 2015-03-03 ENCOUNTER — Telehealth: Payer: Self-pay | Admitting: *Deleted

## 2015-03-03 NOTE — Telephone Encounter (Signed)
Update from patient on voicemail that she vomited a total of five times yesterday.  Two of which were after she left [facility] for IVF and felt no better than before IVF.  Vomited cereal and strawberries yesterday then it was just yellow liquid.  Called nurse on call last night who prescribed one Phenergan.  Slept with no further N/V and no appetite this morning."  Called and learned she feels much better trying to drink clear liquids.  No emesis.  Has Zofran and Compazine on hand.  "I just wanted to let you all know so this information could be in my chart."

## 2015-03-19 ENCOUNTER — Encounter (HOSPITAL_COMMUNITY): Payer: Self-pay

## 2015-03-19 ENCOUNTER — Ambulatory Visit (HOSPITAL_COMMUNITY)
Admission: RE | Admit: 2015-03-19 | Discharge: 2015-03-19 | Disposition: A | Discharge status: Home / Self Care | Payer: Medicare Other | Source: Ambulatory Visit | Attending: Physician Assistant | Admitting: Physician Assistant

## 2015-03-19 DIAGNOSIS — Z923 Personal history of irradiation: Secondary | ICD-10-CM | POA: Insufficient documentation

## 2015-03-19 DIAGNOSIS — Z79899 Other long term (current) drug therapy: Secondary | ICD-10-CM | POA: Diagnosis not present

## 2015-03-19 DIAGNOSIS — C3492 Malignant neoplasm of unspecified part of left bronchus or lung: Secondary | ICD-10-CM | POA: Insufficient documentation

## 2015-03-19 MED ORDER — IOHEXOL 300 MG/ML  SOLN
100.0000 mL | Freq: Once | INTRAMUSCULAR | Status: AC | PRN
  Administered 2015-03-19: 100 mL via INTRAVENOUS

## 2015-03-22 ENCOUNTER — Encounter: Payer: Self-pay | Admitting: Gastroenterology

## 2015-03-22 ENCOUNTER — Ambulatory Visit (HOSPITAL_BASED_OUTPATIENT_CLINIC_OR_DEPARTMENT_OTHER): Payer: Medicare Other | Admitting: Physician Assistant

## 2015-03-22 ENCOUNTER — Encounter: Payer: Self-pay | Admitting: General Practice

## 2015-03-22 ENCOUNTER — Ambulatory Visit (HOSPITAL_BASED_OUTPATIENT_CLINIC_OR_DEPARTMENT_OTHER): Payer: Medicare Other

## 2015-03-22 ENCOUNTER — Encounter: Payer: Self-pay | Admitting: Physician Assistant

## 2015-03-22 ENCOUNTER — Other Ambulatory Visit (HOSPITAL_BASED_OUTPATIENT_CLINIC_OR_DEPARTMENT_OTHER): Payer: Medicare Other

## 2015-03-22 ENCOUNTER — Telehealth: Payer: Self-pay | Admitting: Internal Medicine

## 2015-03-22 VITALS — BP 140/84 | HR 62 | Temp 98.4°F | Resp 18 | Wt 126.1 lb

## 2015-03-22 VITALS — BP 148/80

## 2015-03-22 DIAGNOSIS — M545 Low back pain: Secondary | ICD-10-CM

## 2015-03-22 DIAGNOSIS — C3492 Malignant neoplasm of unspecified part of left bronchus or lung: Secondary | ICD-10-CM

## 2015-03-22 DIAGNOSIS — C3490 Malignant neoplasm of unspecified part of unspecified bronchus or lung: Secondary | ICD-10-CM

## 2015-03-22 DIAGNOSIS — R110 Nausea: Secondary | ICD-10-CM | POA: Diagnosis not present

## 2015-03-22 DIAGNOSIS — C7951 Secondary malignant neoplasm of bone: Secondary | ICD-10-CM | POA: Diagnosis not present

## 2015-03-22 DIAGNOSIS — R1114 Bilious vomiting: Secondary | ICD-10-CM

## 2015-03-22 DIAGNOSIS — C3412 Malignant neoplasm of upper lobe, left bronchus or lung: Secondary | ICD-10-CM | POA: Diagnosis present

## 2015-03-22 DIAGNOSIS — R1110 Vomiting, unspecified: Secondary | ICD-10-CM | POA: Diagnosis not present

## 2015-03-22 DIAGNOSIS — Z5111 Encounter for antineoplastic chemotherapy: Secondary | ICD-10-CM | POA: Insufficient documentation

## 2015-03-22 DIAGNOSIS — C7931 Secondary malignant neoplasm of brain: Secondary | ICD-10-CM | POA: Diagnosis not present

## 2015-03-22 DIAGNOSIS — C801 Malignant (primary) neoplasm, unspecified: Secondary | ICD-10-CM

## 2015-03-22 LAB — COMPREHENSIVE METABOLIC PANEL (CC13)
ALT: 12 U/L (ref 0–55)
AST: 19 U/L (ref 5–34)
Albumin: 3.6 g/dL (ref 3.5–5.0)
Alkaline Phosphatase: 63 U/L (ref 40–150)
Anion Gap: 7 mEq/L (ref 3–11)
BUN: 14.4 mg/dL (ref 7.0–26.0)
CO2: 27 meq/L (ref 22–29)
CREATININE: 0.9 mg/dL (ref 0.6–1.1)
Calcium: 9.2 mg/dL (ref 8.4–10.4)
Chloride: 104 mEq/L (ref 98–109)
EGFR: 63 mL/min/{1.73_m2} — ABNORMAL LOW (ref >90)
GLUCOSE: 92 mg/dL (ref 70–140)
Potassium: 3.8 mEq/L (ref 3.5–5.1)
Sodium: 138 mEq/L (ref 136–145)
TOTAL PROTEIN: 6.3 g/dL — AB (ref 6.4–8.3)
Total Bilirubin: 0.6 mg/dL (ref 0.20–1.20)

## 2015-03-22 LAB — CBC WITH DIFFERENTIAL/PLATELET
BASO%: 0.9 % (ref 0.0–2.0)
Basophils Absolute: 0 10*3/uL (ref 0.0–0.1)
EOS ABS: 0 10*3/uL (ref 0.0–0.5)
EOS%: 0.9 % (ref 0.0–7.0)
HCT: 37.7 % (ref 34.8–46.6)
HGB: 12.7 g/dL (ref 11.6–15.9)
LYMPH%: 27.8 % (ref 14.0–49.7)
MCH: 33.1 pg (ref 25.1–34.0)
MCHC: 33.7 g/dL (ref 31.5–36.0)
MCV: 98.2 fL (ref 79.5–101.0)
MONO#: 0.4 10*3/uL (ref 0.1–0.9)
MONO%: 9.5 % (ref 0.0–14.0)
NEUT%: 60.9 % (ref 38.4–76.8)
NEUTROS ABS: 2.8 10*3/uL (ref 1.5–6.5)
Platelets: 232 10*3/uL (ref 145–400)
RBC: 3.84 10*6/uL (ref 3.70–5.45)
RDW: 13 % (ref 11.2–14.5)
WBC: 4.6 10*3/uL (ref 3.9–10.3)
lymph#: 1.3 10*3/uL (ref 0.9–3.3)

## 2015-03-22 MED ORDER — SODIUM CHLORIDE 0.9 % IV SOLN
400.0000 mg/m2 | Freq: Once | INTRAVENOUS | Status: AC
  Administered 2015-03-22: 700 mg via INTRAVENOUS
  Filled 2015-03-22: qty 28

## 2015-03-22 MED ORDER — SODIUM CHLORIDE 0.9 % IV SOLN
Freq: Once | INTRAVENOUS | Status: AC
  Administered 2015-03-22: 12:00:00 via INTRAVENOUS
  Filled 2015-03-22: qty 4

## 2015-03-22 MED ORDER — CYANOCOBALAMIN 1000 MCG/ML IJ SOLN
INTRAMUSCULAR | Status: AC
  Filled 2015-03-22: qty 1

## 2015-03-22 MED ORDER — SODIUM CHLORIDE 0.9 % IV SOLN
Freq: Once | INTRAVENOUS | Status: AC
  Administered 2015-03-22: 12:00:00 via INTRAVENOUS

## 2015-03-22 MED ORDER — CYANOCOBALAMIN 1000 MCG/ML IJ SOLN
1000.0000 ug | Freq: Once | INTRAMUSCULAR | Status: AC
  Administered 2015-03-22: 1000 ug via INTRAMUSCULAR

## 2015-03-22 NOTE — Patient Instructions (Signed)
Your recent restaging CT scan revealed no evidence for recurrence or disease progression If further evaluate your sudden episodes of vomiting, we are referring you to [facility] gastroenterology Follow-up in one month

## 2015-03-22 NOTE — Telephone Encounter (Signed)
Gave patient avs report and appointments for lab/MM/tx 04/19/15 and Dr. Deatra Ina at [facility] 05/26/15.

## 2015-03-22 NOTE — Progress Notes (Signed)
Spiritual Care Note  Acquainted with Tamara Ortiz from [facility].  Met in lobby today, following up in infusion per pt request.  Tamara Ortiz identified that she is "feeling at a low point and [doesn't] want to take a pill, but would like to have someone to talk to," noting that she had already planned to reach out to chaplain for support.  Her top stated concern is needing more meaningful structure and ways to contribute in her everyday life.  Beneath that, she also indicated concern about her brain MRI next week, as well as recent fuzziness with memory and beginning to feel like maintaining her home is too much for her.  Brainstormed many ideas for meaning-making and contribution, including sharing meals with her circle of girlfriends or church friends (per pt, she has little appetite, is losing weight, and doesn't eat much if she's alone but does better in community).  Suggested inquiring at church if there might be additional needs she could meet (per pt, she enjoys organization and office work), looking for [employer] publicized at [employer], and similar resources.  Brought her three art prints with reflective messages as invitations to live life deliberately/sacramentally and to become aware of what she needs in order for it to "be well with [her] soul."  Her affect and comments reflected real appreciation for creative reflection.  With pt's permission, also consulted with Tamara Riley, LCSW, with whom she has worked in the past, regarding pt's contribution needs and questions about long-term financial planning.  Tamara Ortiz and I will continue to follow, and I plan to phone next week to follow up on her motivation and MRI, but please also page as needs arise.  Thank you.  [place], [place]

## 2015-03-22 NOTE — Progress Notes (Addendum)
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   PRIOR THERAPY:  1. Status post left upper lobectomy with mediastinal lymph node dissection under the care of Dr. Roxan Ortiz on December 10, 2007.  2. Status post 4 cycles of adjuvant chemotherapy with cisplatin and Taxotere. Last dose was given 03/22/2008.  3. Status post 6 cycles of systemic chemotherapy with carboplatin and Alimta for metastatic disease in the bone. The last dose was given July 02, 2010, with stable disease.  4. Status post 12 cycles of maintenance Alimta at 500 mg/sq m given every 3 weeks. The last dose was given 05/08/2011. 5. Stereotactic radiotherapy to metastatic brain lesions under the care of Dr. Tammi Ortiz completed on 07/30/2014.  CURRENT THERAPY:  1. Maintenance Alimta at 500 mg/sq m given every 4 weeks. The patient is status post 46 cycles.  2. Zometa 4 mg IV given every 2 months for bone metastasis.   CHEMOTHERAPY INTENT: Palliative  CURRENT # OF CHEMOTHERAPY CYCLES: 47 CURRENT ANTIEMETICS: none  CURRENT SMOKING STATUS: Never smoker   ORAL CHEMOTHERAPY AND CONSENT: n/a  CURRENT BISPHOSPHONATES USE: Yes, Zometa given every 2 months  PAIN MANAGEMENT: [employer]  NARCOTICS INDUCED CONSTIPATION: Occasional, uses MiraLax  LIVING WILL AND CODE STATUS: Has a living will   INTERVAL HISTORY: Tamara Ortiz 74 y.o. female returns to the clinic today for followup visit. The patient is feeling fine today with no specific complaints except for increased episodes of vomiting over the past month. These episodes are not always associated with nausea.She didn't have much in the way of vomiting until she  was treated for a brain metastatic lesion with stereotactic radiotherapy in October 2015. Her symptoms seem to be increasing in frequency. She states that the episodes occur suddenly and that she is vomiting bile not food. She had one occurrence where she had 5 episodes of vomiting in one day and came into the office for IV fluids. She had at least 2 other episodes throughout the month. Her energy level has improved and she tells me she has made 2 trips to the beach as well as resumed dancing.  She denied having any significant weight loss or night sweats. She has no chest pain, shortness of breath, cough or hemoptysis. She denied having any significant nausea or vomiting but she noticed black tarry stool. She has no fever or chills. She is tolerating her treatment with Alimta fairly well with no significant adverse effects. She is here today to start cycle #47 of her treatment. She recently had a restaging CT scan of the chest, abdomen and pelvis and presents to discuss the results.  MEDICAL HISTORY: Past Medical History  Diagnosis Date  . Hypertension   . HYPERLIPIDEMIA 11/05/2007  . ANXIETY 05/13/2009  . DEPRESSIVE DISORDER 05/06/2008  . HYPERTENSION 11/05/2007  . ALLERGIC RHINITIS 11/05/2007  . CONSTIPATION, CHRONIC 05/13/2009  . UTI 05/06/2008  . BACK PAIN 05/11/2008  . OSTEOPOROSIS 11/05/2007  . INSOMNIA-SLEEP DISORDER-UNSPEC 05/13/2009  . FATIGUE 05/13/2009  . Swelling, mass, or lump in chest 11/05/2007  . LUNG CANCER, HX OF 05/06/2008  . 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  Lung cancer   . Brain cancer     ALLERGIES:  is allergic to amoxicillin and levaquin.  MEDICATIONS:  Current Outpatient Prescriptions  Medication Sig Dispense Refill  . Alum & Mag Hydroxide-Simeth (MAGIC MOUTHWASH) SOLN Take 5 mLs by mouth 4 (four) times daily as needed for mouth pain. Swish and spit 240 mL 0  . aspirin 81 MG EC tablet Take 81 mg by mouth daily.      . Calcium Carbonate-Vitamin D  (CALCIUM-VITAMIN D) 500-200 MG-UNIT per tablet Take 1 tablet by mouth 2 (two) times daily.      . folic acid (FOLVITE) 1 MG tablet TAKE 1 TABLET BY MOUTH DAILY 90 tablet 0  . HYDROcodone-acetaminophen (NORCO) 5-325 MG per tablet Take 1 tablet by mouth every 6 (six) hours as needed for moderate pain. 30 tablet 0  . LORazepam (ATIVAN) 0.5 MG tablet Take 1 tablet (0.5 mg total) by mouth every 8 (eight) hours as needed for anxiety. 40 tablet 0  . Multiple Vitamin (ANTIOXIDANT A/C/E PO) Take by mouth daily.      . ondansetron (ZOFRAN ODT) 8 MG disintegrating tablet Take 1 tablet (8 mg total) by mouth every 8 (eight) hours as needed for nausea or vomiting. 30 tablet 1  . polyethylene glycol powder (MIRALAX) powder Take 17 g by mouth daily.      [name] zolendronic acid (ZOMETA) 4 MG/5ML injection Inject 4 mg into the vein once.      [name] zolpidem (AMBIEN CR) 6.25 MG CR tablet Take 1 tablet (6.25 mg total) by mouth at bedtime as needed for sleep. 90 tablet 1   No current facility-administered medications for this visit.   Facility-Administered Medications Ordered in Other Visits  Medication Dose Route Frequency Provider Last Rate Last Dose  . cyanocobalamin ((VITAMIN B-12)) injection 1,000 mcg  1,000 mcg Intramuscular Once Tamara Bears, MD      . ondansetron (ZOFRAN) 8 mg, dexamethasone (DECADRON) 10 mg in sodium chloride 0.9 % 50 mL IVPB   Intravenous Once Tamara Bears, MD      . PEMEtrexed (ALIMTA) 700 mg in sodium chloride 0.9 % 100 mL chemo infusion  400 mg/m2 (Treatment Plan Actual) Intravenous Once Tamara Bears, MD        SURGICAL HISTORY:  Past Surgical History  Procedure Laterality Date  . Tubal ligation    . S/p lul lung surgury  12/2007  . Lobectomy      REVIEW OF SYSTEMS:  Constitutional: positive for fatigue and improved Eyes: negative Ears, nose, mouth, throat, and face: negative Respiratory: negative Cardiovascular: negative Gastrointestinal: positive for vomiting and sudden  episodes, vomits "bile" not food Genitourinary:negative Integument/breast: negative Hematologic/lymphatic: negative Musculoskeletal:positive for arthralgias and back pain Neurological: negative Behavioral/Psych: negative Endocrine: negative Allergic/Immunologic: negative   PHYSICAL EXAMINATION: General appearance: alert, cooperative and no distress Head: Normocephalic, without obvious abnormality, atraumatic Neck: no adenopathy Lymph nodes: Cervical, supraclavicular, and axillary nodes normal. Resp: clear to auscultation bilaterally Back: symmetric, no curvature. ROM normal. No CVA tenderness. Cardio: regular rate and rhythm, S1, S2 normal, no murmur, click, rub or gallop GI: soft, non-tender; bowel sounds normal; no masses,  no organomegaly Extremities: extremities normal, atraumatic, no cyanosis or edema Neurologic: Alert and oriented X 3, normal strength and tone. Normal symmetric reflexes. Normal coordination and gait  ECOG PERFORMANCE STATUS: 1 - Symptomatic but completely ambulatory  Blood pressure 140/84, pulse 62, temperature 98.4 F (36.9 C), temperature source Oral, resp. rate 18, weight 126 lb 1.6 oz (57.199 kg), SpO2 100 %.  LABORATORY DATA: Lab Results  Component Value Date   WBC 4.6 03/22/2015   HGB 12.7 03/22/2015   HCT 37.7 03/22/2015   MCV 98.2 03/22/2015   PLT 232 03/22/2015      Chemistry      Component Value Date/Time   NA 138 03/22/2015 0937   NA 137 08/12/2014 1716   NA 140 04/17/2012 0855   K 3.8 03/22/2015 0937   K 3.7 08/12/2014 1716   K 4.1 04/17/2012 0855   CL 99 08/12/2014 1716   CL 100 03/04/2013 0901   CL 99 04/17/2012 0855   CO2 27 03/22/2015 0937   CO2 35* 08/12/2014 1716   CO2 29 04/17/2012 0855   BUN 14.4 03/22/2015 0937   BUN 16 08/12/2014 1716   BUN 15 04/17/2012 0855   CREATININE 0.9 03/22/2015 0937   CREATININE 1.1 08/12/2014 1716   CREATININE 1.4* 04/17/2012 0855      Component Value Date/Time   CALCIUM 9.2 03/22/2015  0937   CALCIUM 10.6* 08/12/2014 1716   CALCIUM 9.5 04/17/2012 0855   ALKPHOS 63 03/22/2015 0937   ALKPHOS 79 08/12/2014 1716   ALKPHOS 68 04/17/2012 0855   AST 19 03/22/2015 0937   AST 28 08/12/2014 1716   AST 29 04/17/2012 0855   ALT 12 03/22/2015 0937   ALT 24 08/12/2014 1716   ALT 31 04/17/2012 0855   BILITOT 0.60 03/22/2015 0937   BILITOT 0.4 08/12/2014 1716   BILITOT 0.60 04/17/2012 0855       RADIOGRAPHIC STUDIES: Ct Chest W Contrast  03/19/2015   CLINICAL DATA:  Left lung cancer diagnosed in 2009. Chemotherapy in progress. Previous brain radiation. Subsequent encounter.  EXAM: CT CHEST, ABDOMEN, AND PELVIS WITH CONTRAST  TECHNIQUE: Multidetector CT imaging of the chest, abdomen and pelvis was performed following the standard protocol during bolus administration of intravenous contrast.  CONTRAST:  127m OMNIPAQUE IOHEXOL 300 MG/ML  SOLN  COMPARISON:  12/25/2014.  09/30/2014.  FINDINGS: CT CHEST FINDINGS  Mediastinum/Nodes: There are no enlarged mediastinal, hilar or axillary lymph nodes. Stable small hiatal hernia. The thyroid gland and trachea demonstrate no significant findings. The heart size is normal. There is no pericardial effusion.Atherosclerosis of the aorta, great vessels and coronary arteries appears unchanged.  Lungs/Pleura: There is no pleural effusion.Status post left upper lobe resection. 5 mm subpleural nodule in the left lower lobe on image 32 has not significantly changed. There is another 5 mm ground-glass nodule in the left lower lobe on image 44 which is also stable. Minimal nodularity along the minor fissure on image 30 is stable. No suspicious nodules demonstrated.  Musculoskeletal/Chest wall: No chest wall mass or new suspicious osseous findings. Scattered sclerotic lesions in the spine are stable, largest at T6. Lipoma noted posterior to the right scapular glenoid.  CT ABDOMEN AND PELVIS FINDINGS  Hepatobiliary: The liver is normal in density without focal  abnormality. No evidence of gallstones, gallbladder wall thickening or biliary dilatation.  Pancreas: Unremarkable. No pancreatic ductal dilatation or surrounding inflammatory changes.  Spleen: Normal in size without focal abnormality.  Adrenals/Urinary Tract: Both adrenal glands appear normal.Stable tiny low-density right renal lesions. No suspicious renal finding, hydronephrosis or urinary tract calculus. The bladder appears unremarkable for its degree of distention.  Stomach/Bowel: No evidence of bowel wall thickening, distention or surrounding inflammatory change.  Vascular/Lymphatic: There are no enlarged abdominal or pelvic lymph nodes. Stable aortoiliac atherosclerosis.  Reproductive: Stable.  No evidence of adnexal mass.  Other: No evidence of abdominal wall mass  or hernia.  Musculoskeletal: Stable sclerotic lesions, largest within the S1 segment and left iliac bone, consistent with treated metastases. No lytic lesion or pathologic fracture demonstrated. Advanced facet disease at L5-S1 again noted.  IMPRESSION: 1. Stable sclerotic lesions within the spine and pelvis, consistent with treated metastases. 2. No evidence of disease progression or local recurrence. Mild pulmonary nodularity appears unchanged.   Electronically Signed   By: Richardean Sale M.D.   On: 03/19/2015 12:30   Ct Abdomen Pelvis W Contrast  03/19/2015   CLINICAL DATA:  Left lung cancer diagnosed in 2009. Chemotherapy in progress. Previous brain radiation. Subsequent encounter.  EXAM: CT CHEST, ABDOMEN, AND PELVIS WITH CONTRAST  TECHNIQUE: Multidetector CT imaging of the chest, abdomen and pelvis was performed following the standard protocol during bolus administration of intravenous contrast.  CONTRAST:  122m OMNIPAQUE IOHEXOL 300 MG/ML  SOLN  COMPARISON:  12/25/2014.  09/30/2014.  FINDINGS: CT CHEST FINDINGS  Mediastinum/Nodes: There are no enlarged mediastinal, hilar or axillary lymph nodes. Stable small hiatal hernia. The thyroid  gland and trachea demonstrate no significant findings. The heart size is normal. There is no pericardial effusion.Atherosclerosis of the aorta, great vessels and coronary arteries appears unchanged.  Lungs/Pleura: There is no pleural effusion.Status post left upper lobe resection. 5 mm subpleural nodule in the left lower lobe on image 32 has not significantly changed. There is another 5 mm ground-glass nodule in the left lower lobe on image 44 which is also stable. Minimal nodularity along the minor fissure on image 30 is stable. No suspicious nodules demonstrated.  Musculoskeletal/Chest wall: No chest wall mass or new suspicious osseous findings. Scattered sclerotic lesions in the spine are stable, largest at T6. Lipoma noted posterior to the right scapular glenoid.  CT ABDOMEN AND PELVIS FINDINGS  Hepatobiliary: The liver is normal in density without focal abnormality. No evidence of gallstones, gallbladder wall thickening or biliary dilatation.  Pancreas: Unremarkable. No pancreatic ductal dilatation or surrounding inflammatory changes.  Spleen: Normal in size without focal abnormality.  Adrenals/Urinary Tract: Both adrenal glands appear normal.Stable tiny low-density right renal lesions. No suspicious renal finding, hydronephrosis or urinary tract calculus. The bladder appears unremarkable for its degree of distention.  Stomach/Bowel: No evidence of bowel wall thickening, distention or surrounding inflammatory change.  Vascular/Lymphatic: There are no enlarged abdominal or pelvic lymph nodes. Stable aortoiliac atherosclerosis.  Reproductive: Stable.  No evidence of adnexal mass.  Other: No evidence of abdominal wall mass or hernia.  Musculoskeletal: Stable sclerotic lesions, largest within the S1 segment and left iliac bone, consistent with treated metastases. No lytic lesion or pathologic fracture demonstrated. Advanced facet disease at L5-S1 again noted.  IMPRESSION: 1. Stable sclerotic lesions within the  spine and pelvis, consistent with treated metastases. 2. No evidence of disease progression or local recurrence. Mild pulmonary nodularity appears unchanged.   Electronically Signed   By: WRichardean SaleM.D.   On: 03/19/2015 12:30    ASSESSMENT AND PLAN: This is a very pleasant 74years old white female with recurrent non-small cell lung cancer, adenocarcinoma. The patient is currently on maintenance Alimta monthly status post total of 46 cycles.  The patient is feeling fine and tolerating the treatment well. For the nausea, she will continue Zofran and Ativan for nausea as well. The patient was discussed with and also seen by Dr. MJulien Nordmann Her recent restaging CT scan revealed no evidence of local recurrence or evidence for disease progression. She will proceed with cycle #47 today as scheduled. For the vomiting  we will refer her to follow-up our gastroenterology, as this is where she previously had a colonoscopy, for further evaluation and management of her vomiting. It is unclear whether she has an ulcer or some other entity that may be causing her symptoms. She will follow up in one month prior to cycle #48.   For the low back pain, although not problematic now, the patient will continue on Vicodin on as-needed basis  The patient was advised to call immediately if she has any concerning symptoms in the interval.  The patient voices understanding of current disease status and treatment options and is in agreement with the current care plan.  All questions were answered. The patient knows to call the clinic with any problems, questions or concerns. We can certainly see the patient much sooner if necessary.  Carlton Adam PA-C  ADDENDUM: Hematology/Oncology Attending: I had a face to face encounter with the patient today. I recommended her care plan. This is a very pleasant 74 years old white female with metastatic non-small cell lung cancer, adenocarcinoma who is currently undergoing  maintenance treatment with single agent Alimta status post 46 cycles and tolerating her treatment fairly well except for few episodes of nausea started after her brain metastasis but her brain lesion showed no evidence for disease progression on the previous MRI of the brain performed 2 months ago. The recent CT scan of the chest, abdomen and pelvis showed no evidence for disease progression. I discussed the scan results with the patient today. I recommended for her to continue her current treatment with maintenance Alimta. For the nausea The patient was advised to take Ativan on as-needed basis in addition to her antiemetics. We will also refer her to gastroenterology for evaluation and to rule out any other abnormality. She would come back for follow-up visit in 4 weeks with the start of cycle #48. The patient was advised to call immediately she has any concerning symptoms in the interval.  Disclaimer: This note was dictated with voice recognition software. Similar sounding words can inadvertently be transcribed and may not be corrected upon review. Eilleen Kempf., MD 03/22/2015

## 2015-03-22 NOTE — Patient Instructions (Signed)
[facility] Discharge Instructions for Patients Receiving Chemotherapy  Today you received the following chemotherapy agents:  Alimta  To help prevent nausea and vomiting after your treatment, we encourage you to take your nausea medication as ordered per MD.   If you develop nausea and vomiting that is not controlled by your nausea medication, call the clinic.   BELOW ARE SYMPTOMS THAT SHOULD BE REPORTED IMMEDIATELY:  *FEVER GREATER THAN 100.5 F  *CHILLS WITH OR WITHOUT FEVER  NAUSEA AND VOMITING THAT IS NOT CONTROLLED WITH YOUR NAUSEA MEDICATION  *UNUSUAL SHORTNESS OF BREATH  *UNUSUAL BRUISING OR BLEEDING  TENDERNESS IN MOUTH AND THROAT WITH OR WITHOUT PRESENCE OF ULCERS  *URINARY PROBLEMS  *BOWEL PROBLEMS  UNUSUAL RASH Items with * indicate a potential emergency and should be followed up as soon as possible.  Feel free to call the clinic you have any questions or concerns. The clinic phone number is [phone].  Please show the CHEMO ALERT CARD at check-in to the Emergency Department and triage nurse.   

## 2015-03-29 ENCOUNTER — Telehealth: Payer: Self-pay | Admitting: *Deleted

## 2015-03-29 NOTE — Telephone Encounter (Signed)
Pt called and left message re: Pt had chemo last Monday 6/20, and has been feeling extremely cold since then.  Spoke with pt and was informed that pt has been experiencing extremely cold since last Alimta chemo on 03/22/15.  Stated had cold sweat on Fri night;   Right foot staying cold with some discoloration -  happened 2 weeks ago.  Stated she did mention about feet coldness at office visit on 6/20 but did not go into details with provider.   Pt also stated her primary had taken pt off BP meds;  For about 2 weeks now, pt has had increased bilateral ankles swelling, and increased BP readings. Denied pain or discomfort with walking. Instructed pt to call her primary to notify about BP and ankles swelling since being off medications.  Pt wanted to know if Dr. Julien Nordmann would suggest anything that pt could do to help with the coldness feeling. Pt's  Phone    [phone].

## 2015-03-29 NOTE — Telephone Encounter (Signed)
Nothing else to offer.

## 2015-03-30 ENCOUNTER — Encounter: Payer: Self-pay | Admitting: Radiation Therapy

## 2015-03-30 ENCOUNTER — Ambulatory Visit
Admission: RE | Admit: 2015-03-30 | Discharge: 2015-03-30 | Disposition: A | Discharge status: Home / Self Care | Payer: Medicare Other | Source: Ambulatory Visit | Attending: Radiation Oncology | Admitting: Radiation Oncology

## 2015-03-30 DIAGNOSIS — C7931 Secondary malignant neoplasm of brain: Secondary | ICD-10-CM

## 2015-03-30 MED ORDER — GADOBENATE DIMEGLUMINE 529 MG/ML IV SOLN: 11.0000 mL | Freq: Once | INTRAVENOUS | Status: AC | PRN

## 2015-03-30 NOTE — Telephone Encounter (Signed)
I called pt and gave her Dr Julien Nordmann input. HSe left a message with her PCP but has not heard back.I reiterated her to contact PCP re feet staying cold and some discoloration and to f/u with [facility] as scheduled .

## 2015-03-31 ENCOUNTER — Ambulatory Visit
Admission: RE | Admit: 2015-03-31 | Discharge: 2015-03-31 | Disposition: A | Discharge status: Home / Self Care | Payer: Medicare Other | Source: Ambulatory Visit | Attending: Radiation Oncology | Admitting: Radiation Oncology

## 2015-03-31 DIAGNOSIS — C7931 Secondary malignant neoplasm of brain: Secondary | ICD-10-CM

## 2015-04-01 ENCOUNTER — Encounter: Payer: Self-pay | Admitting: General Practice

## 2015-04-01 NOTE — Progress Notes (Signed)
Spiritual Care Note  Followed up by phone per our arrangement for support re brain scan results.  Tamara Ortiz was in good spirits, thanking God for no growth and thus no need for surgery at this time.  Tamara Ortiz is concerned about vomiting (as other notes indicate); not having GI appointment until August is another stressor.  Reiterated availability for ongoing support and opportunity to process.  Pt verbalized appreciation.  Ray, [place] Pager [phone] VM [phone]

## 2015-04-06 ENCOUNTER — Telehealth: Payer: Self-pay | Admitting: *Deleted

## 2015-04-06 NOTE — Telephone Encounter (Signed)
Pt left message requesting refill of Hydrocodone 5-'325mg'$  .   Pt's  Phone    [phone].

## 2015-04-07 ENCOUNTER — Other Ambulatory Visit: Payer: Self-pay | Admitting: *Deleted

## 2015-04-07 DIAGNOSIS — C7951 Secondary malignant neoplasm of bone: Secondary | ICD-10-CM

## 2015-04-07 DIAGNOSIS — C3492 Malignant neoplasm of unspecified part of left bronchus or lung: Secondary | ICD-10-CM

## 2015-04-07 MED ORDER — HYDROCODONE-ACETAMINOPHEN 5-325 MG PO TABS: 1.0000 | ORAL_TABLET | Freq: Four times a day (QID) | ORAL | Status: DC | PRN

## 2015-04-07 NOTE — Telephone Encounter (Signed)
Message from Triage pt requesting refill on Hydrocodone 5/'325mg'$ . Reviewed with MD, ok to refill. Pt notified.

## 2015-04-19 ENCOUNTER — Ambulatory Visit (HOSPITAL_BASED_OUTPATIENT_CLINIC_OR_DEPARTMENT_OTHER): Payer: Medicare Other | Admitting: Internal Medicine

## 2015-04-19 ENCOUNTER — Telehealth: Payer: Self-pay | Admitting: *Deleted

## 2015-04-19 ENCOUNTER — Telehealth: Payer: Self-pay | Admitting: Internal Medicine

## 2015-04-19 ENCOUNTER — Ambulatory Visit (HOSPITAL_BASED_OUTPATIENT_CLINIC_OR_DEPARTMENT_OTHER): Payer: Medicare Other

## 2015-04-19 ENCOUNTER — Other Ambulatory Visit (HOSPITAL_BASED_OUTPATIENT_CLINIC_OR_DEPARTMENT_OTHER): Payer: Medicare Other

## 2015-04-19 ENCOUNTER — Encounter: Payer: Self-pay | Admitting: Internal Medicine

## 2015-04-19 VITALS — BP 153/79 | HR 66 | Temp 98.4°F | Resp 18 | Ht 65.0 in | Wt 125.5 lb

## 2015-04-19 DIAGNOSIS — Z5111 Encounter for antineoplastic chemotherapy: Secondary | ICD-10-CM | POA: Diagnosis present

## 2015-04-19 DIAGNOSIS — R112 Nausea with vomiting, unspecified: Secondary | ICD-10-CM | POA: Diagnosis not present

## 2015-04-19 DIAGNOSIS — C7931 Secondary malignant neoplasm of brain: Secondary | ICD-10-CM | POA: Diagnosis not present

## 2015-04-19 DIAGNOSIS — C3492 Malignant neoplasm of unspecified part of left bronchus or lung: Secondary | ICD-10-CM

## 2015-04-19 DIAGNOSIS — C3412 Malignant neoplasm of upper lobe, left bronchus or lung: Secondary | ICD-10-CM

## 2015-04-19 DIAGNOSIS — M545 Low back pain: Secondary | ICD-10-CM

## 2015-04-19 DIAGNOSIS — C7951 Secondary malignant neoplasm of bone: Secondary | ICD-10-CM

## 2015-04-19 DIAGNOSIS — C3490 Malignant neoplasm of unspecified part of unspecified bronchus or lung: Secondary | ICD-10-CM

## 2015-04-19 DIAGNOSIS — C801 Malignant (primary) neoplasm, unspecified: Secondary | ICD-10-CM

## 2015-04-19 LAB — CBC WITH DIFFERENTIAL/PLATELET
BASO%: 1.1 % (ref 0.0–2.0)
BASOS ABS: 0.1 10*3/uL (ref 0.0–0.1)
EOS ABS: 0 10*3/uL (ref 0.0–0.5)
EOS%: 0.3 % (ref 0.0–7.0)
HCT: 37.9 % (ref 34.8–46.6)
HEMOGLOBIN: 12.7 g/dL (ref 11.6–15.9)
LYMPH%: 21.8 % (ref 14.0–49.7)
MCH: 32.4 pg (ref 25.1–34.0)
MCHC: 33.5 g/dL (ref 31.5–36.0)
MCV: 96.7 fL (ref 79.5–101.0)
MONO#: 0.4 10*3/uL (ref 0.1–0.9)
MONO%: 9.6 % (ref 0.0–14.0)
NEUT#: 3.1 10*3/uL (ref 1.5–6.5)
NEUT%: 67.2 % (ref 38.4–76.8)
Platelets: 249 10*3/uL (ref 145–400)
RBC: 3.92 10*6/uL (ref 3.70–5.45)
RDW: 13.1 % (ref 11.2–14.5)
WBC: 4.6 10*3/uL (ref 3.9–10.3)
lymph#: 1 10*3/uL (ref 0.9–3.3)

## 2015-04-19 LAB — COMPREHENSIVE METABOLIC PANEL (CC13)
ALT: 14 U/L (ref 0–55)
AST: 18 U/L (ref 5–34)
Albumin: 3.6 g/dL (ref 3.5–5.0)
Alkaline Phosphatase: 62 U/L (ref 40–150)
Anion Gap: 5 mEq/L (ref 3–11)
BUN: 14.5 mg/dL (ref 7.0–26.0)
CO2: 29 mEq/L (ref 22–29)
CREATININE: 0.9 mg/dL (ref 0.6–1.1)
Calcium: 9.7 mg/dL (ref 8.4–10.4)
Chloride: 103 mEq/L (ref 98–109)
EGFR: 69 mL/min/{1.73_m2} — AB (ref >90)
Glucose: 98 mg/dl (ref 70–140)
Potassium: 4.2 mEq/L (ref 3.5–5.1)
Sodium: 137 mEq/L (ref 136–145)
TOTAL PROTEIN: 6.4 g/dL (ref 6.4–8.3)
Total Bilirubin: 0.45 mg/dL (ref 0.20–1.20)

## 2015-04-19 MED ORDER — ZOLEDRONIC ACID 4 MG/100ML IV SOLN
4.0000 mg | Freq: Once | INTRAVENOUS | Status: AC
  Administered 2015-04-19: 4 mg via INTRAVENOUS
  Filled 2015-04-19: qty 100

## 2015-04-19 MED ORDER — SODIUM CHLORIDE 0.9 % IV SOLN
Freq: Once | INTRAVENOUS | Status: AC
  Administered 2015-04-19: 16:00:00 via INTRAVENOUS

## 2015-04-19 MED ORDER — SODIUM CHLORIDE 0.9 % IV SOLN
Freq: Once | INTRAVENOUS | Status: AC
  Administered 2015-04-19: 16:00:00 via INTRAVENOUS
  Filled 2015-04-19: qty 4

## 2015-04-19 MED ORDER — PEMETREXED DISODIUM CHEMO INJECTION 500 MG
400.0000 mg/m2 | Freq: Once | INTRAVENOUS | Status: AC
  Administered 2015-04-19: 700 mg via INTRAVENOUS
  Filled 2015-04-19: qty 28

## 2015-04-19 NOTE — Telephone Encounter (Signed)
Pt confirmed labs/ov per 07/18 POF, gave pt AVS and Calendar.Cherylann Banas, sent msg to add chemo

## 2015-04-19 NOTE — Telephone Encounter (Signed)
Per staff message and POF I have scheduled appts. Advised scheduler of appts. [name]  

## 2015-04-19 NOTE — Progress Notes (Signed)
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   PRIOR THERAPY:  1. Status post left upper lobectomy with mediastinal lymph node dissection under the care of Dr. Roxan Hockey on December 10, 2007.  2. Status post 4 cycles of adjuvant chemotherapy with cisplatin and Taxotere. Last dose was given 03/22/2008.  3. Status post 6 cycles of systemic chemotherapy with carboplatin and Alimta for metastatic disease in the bone. The last dose was given July 02, 2010, with stable disease.  4. Status post 12 cycles of maintenance Alimta at 500 mg/sq m given every 3 weeks. The last dose was given 05/08/2011. 5. Stereotactic radiotherapy to metastatic brain lesions under the care of Dr. Tammi Klippel completed on 07/30/2014.  CURRENT THERAPY:  1. Maintenance Alimta at 500 mg/sq m given every 4 weeks. The patient is status post 47 cycles.  2. Zometa 4 mg IV given every 2 months for bone metastasis.   CHEMOTHERAPY INTENT: Palliative  CURRENT # OF CHEMOTHERAPY CYCLES: 48 CURRENT ANTIEMETICS: none  CURRENT SMOKING STATUS: Never smoker   ORAL CHEMOTHERAPY AND CONSENT: n/a  CURRENT BISPHOSPHONATES USE: Yes, Zometa given every 2 months  PAIN MANAGEMENT: Norco  NARCOTICS INDUCED CONSTIPATION: Occasional, uses MiraLax  LIVING WILL AND CODE STATUS: Has a living will   INTERVAL HISTORY: Tamara Ortiz 74 y.o. female returns to the clinic today for followup visit. The patient is feeling fine today with no specific complaints except for several episodes of nausea and vomiting recently. She is currently on Zofran and Ativan for nausea. She does well when she takes the medications. She denied  having any significant weight loss or night sweats. She has no chest pain, shortness of breath, cough or hemoptysis. She has no fever or chills. She is tolerating her treatment with Alimta fairly well with no significant adverse effects. Her last MRI of the brain showed minimal increased size of this 3 right frontal lesions and the slightly increased surrounding edema questionable for progressive postradiation changes versus progressive tumor. The patient is currently followed by Dr. Tammi Klippel and he is planning for another MRI of the brain in a few months. She is here today to start cycle #48 of her treatment.  MEDICAL HISTORY: Past Medical History  Diagnosis Date  . Hypertension   . HYPERLIPIDEMIA 11/05/2007  . ANXIETY 05/13/2009  . DEPRESSIVE DISORDER 05/06/2008  . HYPERTENSION 11/05/2007  . ALLERGIC RHINITIS 11/05/2007  . CONSTIPATION, CHRONIC 05/13/2009  . UTI 05/06/2008  . BACK PAIN 05/11/2008  . OSTEOPOROSIS 11/05/2007  . INSOMNIA-SLEEP DISORDER-UNSPEC 05/13/2009  . FATIGUE 05/13/2009  . Swelling, mass, or lump in chest 11/05/2007  . LUNG CANCER, HX OF 05/06/2008  . 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 Lung cancer   . Brain cancer     ALLERGIES:  is allergic to amoxicillin and levaquin.  MEDICATIONS:  Current Outpatient Prescriptions  Medication Sig Dispense Refill  . Alum & Mag Hydroxide-Simeth (MAGIC MOUTHWASH) SOLN Take 5 mLs by mouth 4 (four) times daily as needed for mouth pain. Swish and spit 240 mL 0  . aspirin 81 MG EC tablet Take 81 mg by mouth daily.      [name]  Calcium Carbonate-Vitamin D (CALCIUM-VITAMIN D) 500-200 MG-UNIT per tablet Take 1 tablet by mouth 2 (two) times daily.      . folic acid (FOLVITE) 1 MG tablet TAKE 1 TABLET BY MOUTH DAILY 90 tablet 0  . HYDROcodone-acetaminophen (NORCO) 5-325 MG per tablet Take 1 tablet by mouth every 6 (six) hours as needed for moderate pain. 30 tablet 0  . LORazepam (ATIVAN) 0.5 MG tablet Take 1 tablet (0.5 mg total) by mouth  every 8 (eight) hours as needed for anxiety. 40 tablet 0  . Multiple Vitamin (ANTIOXIDANT A/C/E PO) Take by mouth daily.      . ondansetron (ZOFRAN ODT) 8 MG disintegrating tablet Take 1 tablet (8 mg total) by mouth every 8 (eight) hours as needed for nausea or vomiting. 30 tablet 1  . pentoxifylline (TRENTAL) 400 MG CR tablet Take 400 mg by mouth 2 (two) times daily.  2  . polyethylene glycol powder (MIRALAX) powder Take 17 g by mouth daily.      [name] zolendronic acid (ZOMETA) 4 MG/5ML injection Inject 4 mg into the vein once.      [name] zolpidem (AMBIEN CR) 6.25 MG CR tablet Take 1 tablet (6.25 mg total) by mouth at bedtime as needed for sleep. 90 tablet 1   No current facility-administered medications for this visit.    SURGICAL HISTORY:  Past Surgical History  Procedure Laterality Date  . Tubal ligation    . S/p lul lung surgury  12/2007  . Lobectomy      REVIEW OF SYSTEMS:  A comprehensive review of systems was negative except for: Gastrointestinal: positive for nausea and vomiting   PHYSICAL EXAMINATION: General appearance: alert, cooperative and no distress Head: Normocephalic, without obvious abnormality, atraumatic Neck: no adenopathy Lymph nodes: Cervical, supraclavicular, and axillary nodes normal. Resp: clear to auscultation bilaterally Back: symmetric, no curvature. ROM normal. No CVA tenderness. Cardio: regular rate and rhythm, S1, S2 normal, no murmur, click, rub or gallop GI: soft, non-tender; bowel sounds normal; no masses,  no organomegaly Extremities: extremities normal, atraumatic, no cyanosis or edema Neurologic: Alert and oriented X 3, normal strength and tone. Normal symmetric reflexes. Normal coordination and gait  ECOG PERFORMANCE STATUS: 1 - Symptomatic but completely ambulatory  Blood pressure 153/79, pulse 66, temperature 98.4 F (36.9 C), temperature source Oral, resp. rate 18, height '5\' 5"'  (1.651 m), weight 125 lb 8 oz (56.926 kg), SpO2 100 %.  LABORATORY  DATA: Lab Results  Component Value Date   WBC 4.6 04/19/2015   HGB 12.7 04/19/2015   HCT 37.9 04/19/2015   MCV 96.7 04/19/2015   PLT 249 04/19/2015      Chemistry      Component Value Date/Time   NA 137 04/19/2015 1256   NA 137 08/12/2014 1716   NA 140 04/17/2012 0855   K 4.2 04/19/2015 1256   K 3.7 08/12/2014 1716   K 4.1 04/17/2012 0855   CL 99 08/12/2014 1716   CL 100 03/04/2013 0901   CL 99 04/17/2012 0855   CO2 29 04/19/2015 1256   CO2 35* 08/12/2014 1716   CO2 29 04/17/2012 0855   BUN 14.5 04/19/2015 1256   BUN 16 08/12/2014 1716   BUN 15 04/17/2012 0855   CREATININE 0.9 04/19/2015 1256   CREATININE 1.1 08/12/2014 1716   CREATININE 1.4* 04/17/2012 0855      Component Value Date/Time   CALCIUM 9.7 04/19/2015 1256   CALCIUM 10.6* 08/12/2014 1716   CALCIUM 9.5 04/17/2012 0855   ALKPHOS  62 04/19/2015 1256   ALKPHOS 79 08/12/2014 1716   ALKPHOS 68 04/17/2012 0855   AST 18 04/19/2015 1256   AST 28 08/12/2014 1716   AST 29 04/17/2012 0855   ALT 14 04/19/2015 1256   ALT 24 08/12/2014 1716   ALT 31 04/17/2012 0855   BILITOT 0.45 04/19/2015 1256   BILITOT 0.4 08/12/2014 1716   BILITOT 0.60 04/17/2012 0855       RADIOGRAPHIC STUDIES: Mr Jeri Cos Wo Contrast  04/11/15   CLINICAL DATA:  Metastatic non-small cell lung cancer. SRS for brain lesions 07/2014.  EXAM: MRI HEAD WITHOUT AND WITH CONTRAST  TECHNIQUE: Multiplanar, multiecho pulse sequences of the brain and surrounding structures were obtained without and with intravenous contrast.  CONTRAST:  11 mL MultiHance  COMPARISON:  01/26/2015  FINDINGS: There is no evidence of acute infarct, midline shift, or extra-axial fluid collection. Patchy T2 hyperintensities in the periventricular white matter are similar to the prior study and are nonspecific but may reflect mild to moderate chronic small vessel ischemic disease. Right frontal lesions measure 8 mm (series 12, image 148, previously 6 mm) and 9 mm (series 12, image  143, previously 8 mm) with mildly increased surrounding T2 hyperintensity/edema. Slightly more ill-defined nodular focus of enhancement more laterally in the right frontal lobe also appears slightly larger than on the prior study and again appears to extend to the dura (series 12, image 135 and series 11 image 27).  There are 3 adjacent patchy/nodular foci of enhancement in the high left frontal lobe which measure up to 5 mm in size and are slightly more prominent than on the prior study (series 12, images 145 and 146 and series 13 images 26 and 27). 3 mm left frontal operculum lesion questioned on the prior study does not appear significantly changed (series 12, image 113 and series 13, image 35).  There are scattered areas of subtle cortical or sulcal enhancement which are stable to at most minimally more prominent than on the prior study, and it is unclear if these foci of enhancement are vascular or reflect superficial cortical or even leptomeningeal metastatic deposits. Some examples include in the right parietal lobe (series 11 image 17), right frontal lobe (series 11, image 27), and along the posterior falx series 11, images 18 and 19).  Prior bilateral cataract extraction is noted. There is minimal paranasal sinus mucosal thickening. Mastoid air cells are clear. Major intracranial vascular flow voids are preserved.  IMPRESSION: 1. Minimally increased size of 3 right frontal lesions with slightly increased surrounding edema. Slightly increased patchy high left frontal lobe enhancement. Considerations include progressive postradiation changes versus progressive tumor. 2. Additional scattered foci of subtle cortical or sulcal enhancement which are stable to slightly more prominent than on the prior study. These may be vascular, however given the slightly nodular appearance in some locations cortical or leptomeningeal metastatic deposits are also possible.   Electronically Signed   By: Logan Bores   On:  Apr 11, 2015 15:08    ASSESSMENT AND PLAN: This is a very pleasant 74 years old white female with recurrent non-small cell lung cancer, adenocarcinoma. The patient is currently on maintenance Alimta monthly status post total of 47 cycles.  The patient is feeling fine and tolerating the treatment well except for the intermittent nausea and vomiting which could be secondary to her progressive edema and changes in her brain seen on the recent MRI. I recommended for her to take Zofran and Ativan as needed. We may also consider  starting the patient on low-dose Decadron for the questionable vasogenic edema in her brain if no improvement in her symptoms. I recommended for the patient to continue her current treatment with single agent Alimta as scheduled. She would proceed with cycle #48 today as scheduled. For the low back pain, the patient will continue on Vicodin on as-needed basis She would come back for followup visit in one month for reevaluation with the next cycle of her chemotherapy. The patient was advised to call immediately if she has any concerning symptoms in the interval.  The patient voices understanding of current disease status and treatment options and is in agreement with the current care plan.  All questions were answered. The patient knows to call the clinic with any problems, questions or concerns. We can certainly see the patient much sooner if necessary.  Disclaimer: This note was dictated with voice recognition software. Similar sounding words can inadvertently be transcribed and may not be corrected upon review.

## 2015-04-19 NOTE — Patient Instructions (Signed)
[facility] Discharge Instructions for Patients Receiving Chemotherapy  Today you received the following chemotherapy agents: Alimta and Zometa. To help prevent nausea and vomiting after your treatment, we encourage you to take your nausea medication.   If you develop nausea and vomiting that is not controlled by your nausea medication, call the clinic.   BELOW ARE SYMPTOMS THAT SHOULD BE REPORTED IMMEDIATELY:  *FEVER GREATER THAN 100.5 F  *CHILLS WITH OR WITHOUT FEVER  NAUSEA AND VOMITING THAT IS NOT CONTROLLED WITH YOUR NAUSEA MEDICATION  *UNUSUAL SHORTNESS OF BREATH  *UNUSUAL BRUISING OR BLEEDING  TENDERNESS IN MOUTH AND THROAT WITH OR WITHOUT PRESENCE OF ULCERS  *URINARY PROBLEMS  *BOWEL PROBLEMS  UNUSUAL RASH Items with * indicate a potential emergency and should be followed up as soon as possible.  Feel free to call the clinic you have any questions or concerns. The clinic phone number is [phone].  Please show the [facility] at check-in to the Emergency Department and triage nurse.

## 2015-04-28 ENCOUNTER — Ambulatory Visit (INDEPENDENT_AMBULATORY_CARE_PROVIDER_SITE_OTHER): Payer: Medicare Other | Admitting: Nurse Practitioner

## 2015-04-28 ENCOUNTER — Encounter: Payer: Self-pay | Admitting: Nurse Practitioner

## 2015-04-28 VITALS — BP 126/78 | HR 60 | Ht 65.0 in | Wt 121.6 lb

## 2015-04-28 DIAGNOSIS — R112 Nausea with vomiting, unspecified: Secondary | ICD-10-CM | POA: Diagnosis not present

## 2015-04-28 DIAGNOSIS — R634 Abnormal weight loss: Secondary | ICD-10-CM | POA: Diagnosis not present

## 2015-04-28 NOTE — Patient Instructions (Signed)

## 2015-04-29 ENCOUNTER — Encounter: Payer: Self-pay | Admitting: General Practice

## 2015-04-29 ENCOUNTER — Encounter: Payer: Self-pay | Admitting: Nurse Practitioner

## 2015-04-29 DIAGNOSIS — R634 Abnormal weight loss: Secondary | ICD-10-CM | POA: Insufficient documentation

## 2015-04-29 NOTE — Progress Notes (Signed)
Spiritual Care Note  Met Daesia in [place] and moved to my office for more personal conversation.  She verbalized distress and dis-ease with several symptoms:  --weight loss:  Per pt, "three more pounds this month"; "nothing tastes right" (due to chemo), and very little appetite ("I try to force myself to eat"). --vomiting:  Per pt, seems random/unpredictable, not tied to chemo. --memory loss:  Per pt, "I've had chemo on and off for eleven years, and I've never had 'chemo brain,' but now I'm losing my train of thought and having trouble remembering words." --vision decline:  Per pt, just saw eye doctor, and some is due to progression of macular degeneration, but she wonders if some could be cancer- or drug-related. --month off:  Per pt, she has "taken a month off from chemo" from time to time and desires a break "to chill out" while monitoring her symptoms for any possible changes.  Tanette states that she "seem[s] to be more concerned about these [symptoms] than the doctors are," naming her desire for all information available.  Provided pastoral presence and reflective listening, creating safe space for Linda to give voice to these concerns and to begin to process their impact on her life.  While Alazia identifies each symptom as concerning individually, the combination is causing her to feel dispirited and disempowered:  "I'm just at a loss as to what to do."  With her permission, I plan to consult with MDs re her concerns about these symptoms.  We plan for me to f/u by phone to provide further emotional/spiritual support, as well.  Please also page as needs arise or circumstances change.  Thank you.  [place], [place] Pager [phone] Voicemail  [phone]

## 2015-04-29 NOTE — Progress Notes (Addendum)
HPI :  Patient is a 74 year old female known to Dr. Henrene Pastor. She has a history of adenomatous colon polyps. A large tubulovillous adenomatous polyp was removed at time of last colonoscopy in 2008. Patient did not have follow-up colonoscopy as she was diagnosed with lung cancer in 2009. She is status post left upper lobectomy and chemotherapy. She received radiotherapy last year for brain mets. Patient is maintained on Alimita. She is referred by oncology for nausea and vomiting. Most recent MRI of the brain showed minimally increased frontal lesions.    Patient states her nausea and vomiting began after discovering she had brain metastases several months ago. She has had nausea and vomiting with chemotherapy before but the current episodes are different. She is not vomiting food but rather bilious material. The vomiting is not only during her chemotherapy weeks. The vomiting can be rather sudden with only a few minutes of proceeding nausea.  Patient has headaches just before she vomits. The headache resolve after vomiting. Overall she is having these episodes 3-4 times a week. Some days she feels fine. Patient has lost weight which she attributes to poor appetite. She has constipation during chemotherapy weeks but MiraLAX helps.  Past Medical History  Diagnosis Date  . Hypertension   . HYPERLIPIDEMIA 11/05/2007  . ANXIETY 05/13/2009  . DEPRESSIVE DISORDER 05/06/2008  . HYPERTENSION 11/05/2007  . ALLERGIC RHINITIS 11/05/2007  . CONSTIPATION, CHRONIC 05/13/2009  . UTI 05/06/2008  . BACK PAIN 05/11/2008  . OSTEOPOROSIS 11/05/2007  . INSOMNIA-SLEEP DISORDER-UNSPEC 05/13/2009  . Swelling, mass, or lump in chest 11/05/2007  . LUNG CANCER, HX OF 05/06/2008  . 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 Lung cancer   . Brain cancer     Family History  Problem Relation Age of Onset  . Heart disease Mother   . ALS Father   . Alcohol abuse Son     ETOH  . Cancer Neg Hx    History  Substance Use  Topics  . Smoking status: Never Smoker   . Smokeless tobacco: Never Used  . Alcohol Use: Yes   Current Outpatient Prescriptions  Medication Sig Dispense Refill  . Alum & Mag Hydroxide-Simeth (MAGIC MOUTHWASH) SOLN Take 5 mLs by mouth 4 (four) times daily as needed for mouth pain. Swish and spit 240 mL 0  . aspirin 81 MG EC tablet Take 81 mg by mouth daily.      . Calcium Carbonate-Vitamin D (CALCIUM-VITAMIN D) 500-200 MG-UNIT per tablet Take 1 tablet by mouth 2 (two) times daily.      . folic acid (FOLVITE) 1 MG tablet TAKE 1 TABLET BY MOUTH DAILY 90 tablet 0  . HYDROcodone-acetaminophen (NORCO) 5-325 MG per tablet Take 1 tablet by mouth every 6 (six) hours as needed for moderate pain. 30 tablet 0  . LORazepam (ATIVAN) 0.5 MG tablet Take 1 tablet (0.5 mg total) by mouth every 8 (eight) hours as needed for anxiety. 40 tablet 0  . Multiple Vitamin (ANTIOXIDANT A/C/E PO) Take by mouth daily.      . ondansetron (ZOFRAN ODT) 8 MG disintegrating tablet Take 1 tablet (8 mg total) by mouth every 8 (eight) hours as needed for nausea or vomiting. 30 tablet 1  . pentoxifylline (TRENTAL) 400 MG CR tablet Take 400 mg by mouth 2 (two) times daily.  2  . polyethylene glycol powder (MIRALAX) powder Take 17 g by mouth daily.      [name] zolendronic  acid (ZOMETA) 4 MG/5ML injection Inject 4 mg into the vein once.      [name] zolpidem (AMBIEN CR) 6.25 MG CR tablet Take 1 tablet (6.25 mg total) by mouth at bedtime as needed for sleep. 90 tablet 1   No current facility-administered medications for this visit.   Allergies  Allergen Reactions  . Amoxicillin Hives and Itching  . Levaquin [Levofloxacin] Nausea And Vomiting     Review of Systems: Positive for allergy, back pain, vision changes, night sweats, swelling of feet and legs and urine leakage . All other systems reviewed and negative except where noted in HPI.    Mr Jeri Cos Wo Contrast  03/30/2015   CLINICAL DATA:  Metastatic non-small cell lung cancer. SRS  for brain lesions 07/2014.  EXAM: MRI HEAD WITHOUT AND WITH CONTRAST  TECHNIQUE: Multiplanar, multiecho pulse sequences of the brain and surrounding structures were obtained without and with intravenous contrast.  CONTRAST:  11 mL MultiHance  COMPARISON:  01/26/2015  FINDINGS: There is no evidence of acute infarct, midline shift, or extra-axial fluid collection. Patchy T2 hyperintensities in the periventricular white matter are similar to the prior study and are nonspecific but may reflect mild to moderate chronic small vessel ischemic disease. Right frontal lesions measure 8 mm (series 12, image 148, previously 6 mm) and 9 mm (series 12, image 143, previously 8 mm) with mildly increased surrounding T2 hyperintensity/edema. Slightly more ill-defined nodular focus of enhancement more laterally in the right frontal lobe also appears slightly larger than on the prior study and again appears to extend to the dura (series 12, image 135 and series 11 image 27).  There are 3 adjacent patchy/nodular foci of enhancement in the high left frontal lobe which measure up to 5 mm in size and are slightly more prominent than on the prior study (series 12, images 145 and 146 and series 13 images 26 and 27). 3 mm left frontal operculum lesion questioned on the prior study does not appear significantly changed (series 12, image 113 and series 13, image 35).  There are scattered areas of subtle cortical or sulcal enhancement which are stable to at most minimally more prominent than on the prior study, and it is unclear if these foci of enhancement are vascular or reflect superficial cortical or even leptomeningeal metastatic deposits. Some examples include in the right parietal lobe (series 11 image 17), right frontal lobe (series 11, image 27), and along the posterior falx series 11, images 18 and 19).  Prior bilateral cataract extraction is noted. There is minimal paranasal sinus mucosal thickening. Mastoid air cells are clear. Major  intracranial vascular flow voids are preserved.  IMPRESSION: 1. Minimally increased size of 3 right frontal lesions with slightly increased surrounding edema. Slightly increased patchy high left frontal lobe enhancement. Considerations include progressive postradiation changes versus progressive tumor. 2. Additional scattered foci of subtle cortical or sulcal enhancement which are stable to slightly more prominent than on the prior study. These may be vascular, however given the slightly nodular appearance in some locations cortical or leptomeningeal metastatic deposits are also possible.   Electronically Signed   By: Logan Bores   On: 03/30/2015 15:08    Physical Exam: BP 126/78 mmHg  Pulse 60  Ht '5\' 5"'$  (1.651 m)  Wt 121 lb 9.6 oz (55.157 kg)  BMI 20.24 kg/m2 Constitutional: Pleasant,well-developed, white female in no acute distress. HEENT: Normocephalic and atraumatic. Conjunctivae are normal. No scleral icterus. Neck supple.  Cardiovascular: Normal rate, regular rhythm.  Pulmonary/chest: Effort normal and breath sounds normal. No wheezing, rales or rhonchi. Abdominal: Soft, nondistended, nontender. Bowel sounds active throughout. There are no masses palpable. No hepatomegaly. Extremities: no edema Lymphadenopathy: No cervical adenopathy noted. Neurological: Alert and oriented to person place and time. Skin: Skin is warm and dry. No rashes noted. Psychiatric: Normal mood and affect. Behavior is normal.   ASSESSMENT AND PLAN:  74 year old female with metastatic non-small cell lung cancer. Referred for nausea and vomiting. Vomiting is unusual. It isn't related to eating, occurs suddenly with little associated nausea and is often preceded by a headache which resolves after vomiting. Patient is fine in between episodes.   Etiology of vomiting not clear. Could it be from brain mets? We discussed EGD to look for GI etiologies of vomiting. Patient agreeable. The benefits, risks, and potential  complications of EGD with possible biopsies were discussed with the patient and she agrees to proceed. If unable to find EGD appointment in near future will consider UGI to rule out any gross lesions.    Cc: Curt Bears, MD

## 2015-05-03 ENCOUNTER — Ambulatory Visit
Admission: RE | Admit: 2015-05-03 | Discharge: 2015-05-03 | Disposition: A | Discharge status: Home / Self Care | Payer: Medicare Other | Source: Ambulatory Visit | Attending: Radiation Oncology | Admitting: Radiation Oncology

## 2015-05-03 ENCOUNTER — Telehealth: Payer: Self-pay | Admitting: Radiation Oncology

## 2015-05-03 DIAGNOSIS — C3490 Malignant neoplasm of unspecified part of unspecified bronchus or lung: Secondary | ICD-10-CM

## 2015-05-03 DIAGNOSIS — Z515 Encounter for palliative care: Secondary | ICD-10-CM | POA: Diagnosis not present

## 2015-05-03 DIAGNOSIS — C799 Secondary malignant neoplasm of unspecified site: Secondary | ICD-10-CM | POA: Diagnosis not present

## 2015-05-03 DIAGNOSIS — Z7189 Other specified counseling: Secondary | ICD-10-CM

## 2015-05-03 NOTE — Consult Note (Signed)
Patient YN:Tamara Ortiz      DOB: 03-Apr-1941      OZH:[id number]     Consult Note from the Palliative Medicine Team at [facility] Requested by:     PCP: [name], MD Reason for Consultation              Phone                                                                                                                                Number:[phone]  Assessment of patients Current state:   Consult is for introduction to the concept of Palliative Medicine, clarification of Advanced Directives,  holistic support and symptom management as indicated.  This NP Wadie Lessen reviewed medical records, received report from team, assessed the patient and then meet with  Ms. Liviah Cake in the outpatient oncology clinic.  A conversation was had regarding her "real concern" of ongoing weight loss and brief periods of nausea, triggered by a "headache over my eyes" relived quickly with  "vomiting only a mouth full of sputum/green tinged like fluid".  She tells me she has loss 4 lbs this month. She has tried  Zofran but the onset is so quick and resolution so quick, "these meds don't make much impact".  " I just don't know what is important to be concerned about anymore"  Created space/opportunity  for patient to share her concerns regarding her diagnosis and prognosis.  She shares/verbalizes  concerns regarding her long term poor prognosis.  "I know this is getting worse, this brain thing has me very concerned"  She speaks to a period of confusion last week that "was upsetting".  She tells me she is tired of "taking the Alimta" but realizes this is what is "hopefully helping at this time".  Encouraged by oncology to continue for hopes of continued quality life.  She lives alone, has one son who "understands what is going on".  She worries about losing her independence and anticipatory care needs "when things get worse"   A  discussion was had today regarding importance of continued  discussion regarding advanced directives.    Values and goals of care important to patient were attempted to be elicited.  She is encouraged to continue conversation with her son/HPOA so he can honor her wishes when the time comes.  Concepts of Hospice and Palliative Medicine were discussed.   MOST form introduced   Questions and concerns addressed.  Patient was encouraged to call with questions or concerns.  PMT will continue to support holistically.   Goals of Care: 1.  Code Status:  Full code at this time, "I know I need to look at that paperwork and update".    Encouraged to consider DNR/DNI understanding limitations of CPR in patients with terminal disease    2. Scope of Treatment:  At this time patient is open to all available and offered medcial interventions  to prolong quality life.     3. Symptom Management:   1. Nausea/Vomiting:  Patient is going to try utilizing her Ativan 0.5 mg scheduled morning and late afternoon.           2. Anorexia/Weight loss: Detailed discussion had regarding nutritional tips          -small frequent meals, grazing          - High protein foods          - importance of socialization          - half glass of red wine for stimulant          -supplement drinks, Ensure, Breeze, Carnation    3.    Psychosocial:   Much of conversation was around the difficulty of living with a life limiting disease and finding meaning in everyday.  Emotional support offered to patient.   She shared her love of dance and travel, she plans to visit friends in the mountains this week-end.         4. Spiritual:  Strong faith tradition,  [employer].  Prayer is a coping mechanism for her.    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   PRIOR THERAPY:  1. Status  post left upper lobectomy with mediastinal lymph node dissection under the care of Dr. Roxan Hockey on December 10, 2007.  2. Status post 4 cycles of adjuvant chemotherapy with cisplatin and Taxotere. Last dose was given 03/22/2008.  3. Status post 6 cycles of systemic chemotherapy with carboplatin and Alimta for metastatic disease in the bone. The last dose was given July 02, 2010, with stable disease.  4. Status post 12 cycles of maintenance Alimta at 500 mg/sq m given every 3 weeks. The last dose was given 05/08/2011. 5. Stereotactic radiotherapy to metastatic brain lesions under the care of Dr. Tammi Klippel completed on 07/30/2014.  CURRENT THERAPY:  2. Maintenance Alimta at 500 mg/sq m given every 4 weeks. The patient is status post 47 cycles.  3. Zometa 4 mg IV given every 2 months for bone metastasis.    ROS: fatigue, weight loss, poor appetite, episode of nausea and vomiting   PMH:  Past Medical History  Diagnosis Date  . Hypertension   . HYPERLIPIDEMIA 11/05/2007  . ANXIETY 05/13/2009  . DEPRESSIVE DISORDER 05/06/2008  . HYPERTENSION 11/05/2007  . ALLERGIC RHINITIS 11/05/2007  . CONSTIPATION, CHRONIC 05/13/2009  . UTI 05/06/2008  . BACK PAIN 05/11/2008  . OSTEOPOROSIS 11/05/2007  . INSOMNIA-SLEEP DISORDER-UNSPEC 05/13/2009  . FATIGUE 05/13/2009  . Swelling, mass, or lump in chest 11/05/2007  . LUNG CANCER, HX OF 05/06/2008  . 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 Lung cancer   . Brain cancer      PSH: Past Surgical History  Procedure Laterality Date  . Tubal ligation    . S/p lul lung surgury  12/2007  . Lobectomy     I have reviewed the FH and SH and  If appropriate update it with new information. Allergies  Allergen Reactions  . Amoxicillin Hives and Itching  . Levaquin [Levofloxacin] Nausea And Vomiting   Scheduled Meds: Continuous Infusions: PRN Meds:.    There were no vitals taken for this visit.    No intake or output  data in the 24 hours ending  05/03/15 1026     Labs: CBC    Component Value Date/Time   WBC 4.6 04/19/2015 1256   WBC 3.6* 08/12/2014 1716   RBC 3.92 04/19/2015 1256   RBC 3.77* 08/12/2014 1716   HGB 12.7 04/19/2015 1256   HGB 12.4 08/12/2014 1716   HCT 37.9 04/19/2015 1256   HCT 36.2 08/12/2014 1716   PLT 249 04/19/2015 1256   PLT 182.0 08/12/2014 1716   MCV 96.7 04/19/2015 1256   MCV 96.0 08/12/2014 1716   MCH 32.4 04/19/2015 1256   MCH 33.0 05/06/2011 1733   MCHC 33.5 04/19/2015 1256   MCHC 34.1 08/12/2014 1716   RDW 13.1 04/19/2015 1256   RDW 12.4 08/12/2014 1716   LYMPHSABS 1.0 04/19/2015 1256   LYMPHSABS 2.0 08/12/2014 1716   MONOABS 0.4 04/19/2015 1256   MONOABS 0.5 08/12/2014 1716   EOSABS 0.0 04/19/2015 1256   EOSABS 0.0 08/12/2014 1716   BASOSABS 0.1 04/19/2015 1256   BASOSABS 0.0 08/12/2014 1716    BMET    Component Value Date/Time   NA 137 04/19/2015 1256   NA 137 08/12/2014 1716   NA 140 04/17/2012 0855   K 4.2 04/19/2015 1256   K 3.7 08/12/2014 1716   K 4.1 04/17/2012 0855   CL 99 08/12/2014 1716   CL 100 03/04/2013 0901   CL 99 04/17/2012 0855   CO2 29 04/19/2015 1256   CO2 35* 08/12/2014 1716   CO2 29 04/17/2012 0855   GLUCOSE 98 04/19/2015 1256   GLUCOSE 105* 08/12/2014 1716   GLUCOSE 85 03/04/2013 0901   GLUCOSE 95 04/17/2012 0855   BUN 14.5 04/19/2015 1256   BUN 16 08/12/2014 1716   BUN 15 04/17/2012 0855   CREATININE 0.9 04/19/2015 1256   CREATININE 1.1 08/12/2014 1716   CREATININE 1.4* 04/17/2012 0855   CALCIUM 9.7 04/19/2015 1256   CALCIUM 10.6* 08/12/2014 1716   CALCIUM 9.5 04/17/2012 0855   GFRNONAA 57* 05/06/2011 1733   GFRAA >60 05/06/2011 1733    CMP     Component Value Date/Time   NA 137 04/19/2015 1256   NA 137 08/12/2014 1716   NA 140 04/17/2012 0855   K 4.2 04/19/2015 1256   K 3.7 08/12/2014 1716   K 4.1 04/17/2012 0855   CL 99 08/12/2014 1716   CL 100 03/04/2013 0901   CL 99 04/17/2012 0855   CO2 29 04/19/2015 1256   CO2 35*  08/12/2014 1716   CO2 29 04/17/2012 0855   GLUCOSE 98 04/19/2015 1256   GLUCOSE 105* 08/12/2014 1716   GLUCOSE 85 03/04/2013 0901   GLUCOSE 95 04/17/2012 0855   BUN 14.5 04/19/2015 1256   BUN 16 08/12/2014 1716   BUN 15 04/17/2012 0855   CREATININE 0.9 04/19/2015 1256   CREATININE 1.1 08/12/2014 1716   CREATININE 1.4* 04/17/2012 0855   CALCIUM 9.7 04/19/2015 1256   CALCIUM 10.6* 08/12/2014 1716   CALCIUM 9.5 04/17/2012 0855   PROT 6.4 04/19/2015 1256   PROT 6.8 08/12/2014 1716   PROT 6.9 04/17/2012 0855   ALBUMIN 3.6 04/19/2015 1256   ALBUMIN 3.4* 08/12/2014 1716   AST 18 04/19/2015 1256   AST 28 08/12/2014 1716   AST 29 04/17/2012 0855   ALT 14 04/19/2015 1256   ALT 24 08/12/2014 1716   ALT 31 04/17/2012 0855   ALKPHOS 62 04/19/2015 1256   ALKPHOS 79 08/12/2014 1716   ALKPHOS 68 04/17/2012 0855   BILITOT 0.45 04/19/2015  1256   BILITOT 0.4 08/12/2014 1716   BILITOT 0.60 04/17/2012 0855   GFRNONAA 57* 05/06/2011 1733   GFRAA >60 05/06/2011 1733   ECOG PERFORMANCE STATUS* (Eastern Cooperative Oncology Group)  0 Fully active, able to continue with all pre-disease activities without restriction. Pt score  1 Restricted in physically strenuous activity but ambulatory and able to carry out work of a light or sedentary nature, e.g., light house work, office work. 1  2 Ambulatory and capable of all self-care but unable to carry out any work activities. Up and about more than 50% of waking hours.    3 Capable of only limited self-care. Confined to bed or chair more than 50% of waking hours.   4 Completely disabled. Cannot carry on any self-care. Totally confined to bed or chair.   5 Dead.    As published in Am. J. Clin. Oncol.: Eustace Pen, M.M., Colon Flattery., Pollock, D.C., Horton, Sharen Hint., Drexel Iha, P.P.: Toxicity And Response Criteria Of The [facility]. [place] 0:272-536, 1982.  The ECOG Performance Status is in the public domain  therefore available for public use. To duplicate the scale, please cite the reference above and credit the [facility], Tyler Pita M.D., Group Chair    Time In Time Out Total Time Spent with Patient Total Overall Time  0900 0915 75 min 75 min    Greater than 50%  of this time was spent counseling and coordinating care related to the above assessment and plan.   Wadie Lessen NP  Palliative Medicine Team Team Phone # [phone] Pager [phone]

## 2015-05-03 NOTE — Telephone Encounter (Signed)
Per Wadie Lessen, NP/Dr. Tyler Pita called in Ativan 0.5 mg, one tablet by mouth every 8 hours prn anxiety, qty 90 and no refills.

## 2015-05-04 ENCOUNTER — Encounter: Payer: Self-pay | Admitting: Radiation Therapy

## 2015-05-04 ENCOUNTER — Other Ambulatory Visit: Payer: Self-pay | Admitting: Radiation Therapy

## 2015-05-04 DIAGNOSIS — C7931 Secondary malignant neoplasm of brain: Secondary | ICD-10-CM

## 2015-05-04 NOTE — Progress Notes (Signed)
1.  Do you need a wheel chair?    No  2. On oxygen?   No  3. Have you ever had any surgery in the body part being scanned?  No  4. Have you ever had any surgery on your brain or heart?     No          5. Have you ever had surgery on your eyes or ears?     No               6. Do you have a pacemaker or defibrillator?   No  7. Do you have a Neurostimulator?        No  8. Claustrophobic?  No  9. Any risk for metal in eyes?  No  10. Injury by bullet, buckshot, or shrapnel?  No  11. Stent?     No                                                                                                                  12. Hx of Cancer?   Yes                                                                                                              Type: Lung cancer with mets to the brain  13. Kidney or Liver disease?  No  14. Hx of Lupus, Rheumatoid Arthritis or Scleroderma?  No  15. IV Antibiotics or long term use of NSAIDS?  No  16. HX of Hypertension?  Yes  17. Diabetes?  No  18. Allergy to contrast?  No  19. Recent labs. To be drawn prior to scan    [name]

## 2015-05-07 DIAGNOSIS — Z7189 Other specified counseling: Secondary | ICD-10-CM | POA: Insufficient documentation

## 2015-05-07 DIAGNOSIS — Z515 Encounter for palliative care: Secondary | ICD-10-CM | POA: Insufficient documentation

## 2015-05-07 DIAGNOSIS — C3490 Malignant neoplasm of unspecified part of unspecified bronchus or lung: Secondary | ICD-10-CM | POA: Insufficient documentation

## 2015-05-12 ENCOUNTER — Telehealth: Payer: Self-pay

## 2015-05-12 ENCOUNTER — Other Ambulatory Visit: Payer: Self-pay | Admitting: *Deleted

## 2015-05-12 DIAGNOSIS — C7951 Secondary malignant neoplasm of bone: Secondary | ICD-10-CM

## 2015-05-12 DIAGNOSIS — C3492 Malignant neoplasm of unspecified part of left bronchus or lung: Secondary | ICD-10-CM

## 2015-05-12 MED ORDER — HYDROCODONE-ACETAMINOPHEN 5-325 MG PO TABS: 1.0000 | ORAL_TABLET | Freq: Four times a day (QID) | ORAL | Status: DC | PRN

## 2015-05-12 NOTE — Telephone Encounter (Signed)
Pt called requesting hydrocodone/APAP 5-325 refill. She was requesting it for today. Last filled 04/07/15 #30.

## 2015-05-12 NOTE — Telephone Encounter (Signed)
Pt notified rx for pain med ready for pick up

## 2015-05-17 ENCOUNTER — Telehealth: Payer: Self-pay | Admitting: Internal Medicine

## 2015-05-17 ENCOUNTER — Ambulatory Visit (HOSPITAL_BASED_OUTPATIENT_CLINIC_OR_DEPARTMENT_OTHER): Payer: Medicare Other | Admitting: Internal Medicine

## 2015-05-17 ENCOUNTER — Other Ambulatory Visit (HOSPITAL_BASED_OUTPATIENT_CLINIC_OR_DEPARTMENT_OTHER): Payer: Medicare Other

## 2015-05-17 ENCOUNTER — Ambulatory Visit (HOSPITAL_BASED_OUTPATIENT_CLINIC_OR_DEPARTMENT_OTHER): Payer: Medicare Other

## 2015-05-17 ENCOUNTER — Telehealth: Payer: Self-pay | Admitting: *Deleted

## 2015-05-17 ENCOUNTER — Ambulatory Visit: Payer: Medicare Other | Admitting: Nutrition

## 2015-05-17 ENCOUNTER — Encounter: Payer: Self-pay | Admitting: Internal Medicine

## 2015-05-17 VITALS — BP 144/77 | HR 63

## 2015-05-17 VITALS — BP 149/92 | HR 61 | Temp 98.5°F | Resp 17 | Ht 65.0 in | Wt 123.8 lb

## 2015-05-17 DIAGNOSIS — C7951 Secondary malignant neoplasm of bone: Secondary | ICD-10-CM

## 2015-05-17 DIAGNOSIS — C801 Malignant (primary) neoplasm, unspecified: Secondary | ICD-10-CM

## 2015-05-17 DIAGNOSIS — R110 Nausea: Secondary | ICD-10-CM | POA: Diagnosis not present

## 2015-05-17 DIAGNOSIS — C7931 Secondary malignant neoplasm of brain: Secondary | ICD-10-CM

## 2015-05-17 DIAGNOSIS — Z5111 Encounter for antineoplastic chemotherapy: Secondary | ICD-10-CM

## 2015-05-17 DIAGNOSIS — C3412 Malignant neoplasm of upper lobe, left bronchus or lung: Secondary | ICD-10-CM | POA: Diagnosis present

## 2015-05-17 DIAGNOSIS — C3492 Malignant neoplasm of unspecified part of left bronchus or lung: Secondary | ICD-10-CM

## 2015-05-17 DIAGNOSIS — M545 Low back pain: Secondary | ICD-10-CM | POA: Diagnosis not present

## 2015-05-17 LAB — CBC WITH DIFFERENTIAL/PLATELET
BASO%: 1.1 % (ref 0.0–2.0)
BASOS ABS: 0.1 10*3/uL (ref 0.0–0.1)
EOS ABS: 0 10*3/uL (ref 0.0–0.5)
EOS%: 0.4 % (ref 0.0–7.0)
HEMATOCRIT: 39.9 % (ref 34.8–46.6)
HEMOGLOBIN: 13.5 g/dL (ref 11.6–15.9)
LYMPH#: 0.9 10*3/uL (ref 0.9–3.3)
LYMPH%: 19.1 % (ref 14.0–49.7)
MCH: 33.2 pg (ref 25.1–34.0)
MCHC: 33.9 g/dL (ref 31.5–36.0)
MCV: 97.9 fL (ref 79.5–101.0)
MONO#: 0.5 10*3/uL (ref 0.1–0.9)
MONO%: 10 % (ref 0.0–14.0)
NEUT%: 69.4 % (ref 38.4–76.8)
NEUTROS ABS: 3.4 10*3/uL (ref 1.5–6.5)
PLATELETS: 250 10*3/uL (ref 145–400)
RBC: 4.07 10*6/uL (ref 3.70–5.45)
RDW: 13.1 % (ref 11.2–14.5)
WBC: 4.9 10*3/uL (ref 3.9–10.3)

## 2015-05-17 LAB — COMPREHENSIVE METABOLIC PANEL (CC13)
ALK PHOS: 66 U/L (ref 40–150)
ALT: 15 U/L (ref 0–55)
ANION GAP: 9 meq/L (ref 3–11)
AST: 19 U/L (ref 5–34)
Albumin: 3.8 g/dL (ref 3.5–5.0)
BUN: 14.7 mg/dL (ref 7.0–26.0)
CALCIUM: 9.5 mg/dL (ref 8.4–10.4)
CO2: 26 mEq/L (ref 22–29)
Chloride: 103 mEq/L (ref 98–109)
Creatinine: 1 mg/dL (ref 0.6–1.1)
EGFR: 56 mL/min/{1.73_m2} — AB (ref >90)
Glucose: 93 mg/dl (ref 70–140)
POTASSIUM: 3.9 meq/L (ref 3.5–5.1)
Sodium: 138 mEq/L (ref 136–145)
TOTAL PROTEIN: 6.8 g/dL (ref 6.4–8.3)
Total Bilirubin: 0.41 mg/dL (ref 0.20–1.20)

## 2015-05-17 MED ORDER — SODIUM CHLORIDE 0.9 % IV SOLN
Freq: Once | INTRAVENOUS | Status: AC
  Administered 2015-05-17: 12:00:00 via INTRAVENOUS
  Filled 2015-05-17: qty 4

## 2015-05-17 MED ORDER — PEMETREXED DISODIUM CHEMO INJECTION 500 MG
400.0000 mg/m2 | Freq: Once | INTRAVENOUS | Status: AC
  Administered 2015-05-17: 700 mg via INTRAVENOUS
  Filled 2015-05-17: qty 28

## 2015-05-17 MED ORDER — SODIUM CHLORIDE 0.9 % IV SOLN
Freq: Once | INTRAVENOUS | Status: AC
  Administered 2015-05-17: 12:00:00 via INTRAVENOUS

## 2015-05-17 NOTE — Patient Instructions (Signed)
[facility] Discharge Instructions for Patients Receiving Chemotherapy  Today you received the following chemotherapy agents; Alimta.   To help prevent nausea and vomiting after your treatment, we encourage you to take your nausea medication as directed.    If you develop nausea and vomiting that is not controlled by your nausea medication, call the clinic.   BELOW ARE SYMPTOMS THAT SHOULD BE REPORTED IMMEDIATELY:  *FEVER GREATER THAN 100.5 F  *CHILLS WITH OR WITHOUT FEVER  NAUSEA AND VOMITING THAT IS NOT CONTROLLED WITH YOUR NAUSEA MEDICATION  *UNUSUAL SHORTNESS OF BREATH  *UNUSUAL BRUISING OR BLEEDING  TENDERNESS IN MOUTH AND THROAT WITH OR WITHOUT PRESENCE OF ULCERS  *URINARY PROBLEMS  *BOWEL PROBLEMS  UNUSUAL RASH Items with * indicate a potential emergency and should be followed up as soon as possible.  Feel free to call the clinic you have any questions or concerns. The clinic phone number is [phone].  Please show the CHEMO ALERT CARD at check-in to the Emergency Department and triage nurse.   

## 2015-05-17 NOTE — Progress Notes (Signed)
Brief nutrition follow-up completed with patient during infusion. Patient reports her weight has stabilized but she finds it difficult to find foods she enjoys eating. She does continue to drink [employer] once daily. She dislikes cooking and prefers fresh food at meals. Encouraged patient to purchase ready to eat foods in small amounts. Educated patient on options to prepare a variety of foods in small amounts.   Recommended patient increase calories and protein of Carnation breakfast essentials by using fortified milk. Recipes were provided. Patient will contact me for any further needs and I will follow-up as needed.

## 2015-05-17 NOTE — Telephone Encounter (Signed)
Per staff message and POF I have scheduled appts. Advised scheduler of appts. [name]  

## 2015-05-17 NOTE — Telephone Encounter (Signed)
per pof to sch pt appt-gave pt copy of avs-gave pt contrast and adv [facility] will call to sch -scan

## 2015-05-17 NOTE — Progress Notes (Signed)
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   PRIOR THERAPY:  1. Status post left upper lobectomy with mediastinal lymph node dissection under the care of Dr. Roxan Hockey on December 10, 2007.  2. Status post 4 cycles of adjuvant chemotherapy with cisplatin and Taxotere. Last dose was given 03/22/2008.  3. Status post 6 cycles of systemic chemotherapy with carboplatin and Alimta for metastatic disease in the bone. The last dose was given July 02, 2010, with stable disease.  4. Status post 12 cycles of maintenance Alimta at 500 mg/sq m given every 3 weeks. The last dose was given 05/08/2011. 5. Stereotactic radiotherapy to metastatic brain lesions under the care of Dr. Tammi Klippel completed on 07/30/2014.  CURRENT THERAPY:  1. Maintenance Alimta at 500 mg/sq m given every 4 weeks. The patient is status post 48 cycles.  2. Zometa 4 mg IV given every 2 months for bone metastasis.   CHEMOTHERAPY INTENT: Palliative  CURRENT # OF CHEMOTHERAPY CYCLES: 49 CURRENT ANTIEMETICS: none  CURRENT SMOKING STATUS: Never smoker   ORAL CHEMOTHERAPY AND CONSENT: n/a  CURRENT BISPHOSPHONATES USE: Yes, Zometa given every 2 months  PAIN MANAGEMENT: Norco  NARCOTICS INDUCED CONSTIPATION: Occasional, uses MiraLax  LIVING WILL AND CODE STATUS: Has a living will   INTERVAL HISTORY: Tamara Ortiz 74 y.o. female returns to the clinic today for followup visit. The patient is feeling fine today with no specific complaints except for few episodes of nausea and vomiting recently. She is currently on Zofran and Ativan for nausea. She was referred to gastroenterology for evaluation but has not  seen them yet. She also complains about some memory loss and sundowning. She denied having any significant weight loss or night sweats. She has no chest pain, shortness of breath, cough or hemoptysis. She has no fever or chills. She is tolerating her treatment with Alimta fairly well with no significant adverse effects.  She is here today to start cycle #49 of her treatment.  MEDICAL HISTORY: Past Medical History  Diagnosis Date  . Hypertension   . HYPERLIPIDEMIA 11/05/2007  . ANXIETY 05/13/2009  . DEPRESSIVE DISORDER 05/06/2008  . HYPERTENSION 11/05/2007  . ALLERGIC RHINITIS 11/05/2007  . CONSTIPATION, CHRONIC 05/13/2009  . UTI 05/06/2008  . BACK PAIN 05/11/2008  . OSTEOPOROSIS 11/05/2007  . INSOMNIA-SLEEP DISORDER-UNSPEC 05/13/2009  . FATIGUE 05/13/2009  . Swelling, mass, or lump in chest 11/05/2007  . LUNG CANCER, HX OF 05/06/2008  . 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 Lung cancer   . Brain cancer     ALLERGIES:  is allergic to amoxicillin and levaquin.  MEDICATIONS:  Current Outpatient Prescriptions  Medication Sig Dispense Refill  . Alum & Mag Hydroxide-Simeth (MAGIC MOUTHWASH) SOLN Take 5 mLs by mouth 4 (four) times daily as needed for mouth pain. Swish and spit 240 mL 0  . aspirin 81 MG EC tablet Take 81 mg by mouth daily.      . Calcium Carbonate-Vitamin D (CALCIUM-VITAMIN D) 500-200 MG-UNIT per tablet Take 1 tablet by mouth 2 (two) times daily.      . folic acid (FOLVITE) 1 MG tablet TAKE 1 TABLET  BY MOUTH DAILY 90 tablet 0  . HYDROcodone-acetaminophen (NORCO) 5-325 MG per tablet Take 1 tablet by mouth every 6 (six) hours as needed for moderate pain. 30 tablet 0  . LORazepam (ATIVAN) 0.5 MG tablet Take 1 tablet (0.5 mg total) by mouth every 8 (eight) hours as needed for anxiety. 40 tablet 0  . Multiple Vitamin (ANTIOXIDANT A/C/E PO) Take by mouth daily.      . ondansetron (ZOFRAN ODT) 8 MG disintegrating tablet Take 1 tablet (8 mg total) by mouth every 8 (eight) hours as  needed for nausea or vomiting. 30 tablet 1  . pentoxifylline (TRENTAL) 400 MG CR tablet Take 400 mg by mouth 2 (two) times daily.  2  . polyethylene glycol powder (MIRALAX) powder Take 17 g by mouth daily.      [name] zolendronic acid (ZOMETA) 4 MG/5ML injection Inject 4 mg into the vein once.      [name] zolpidem (AMBIEN CR) 6.25 MG CR tablet Take 1 tablet (6.25 mg total) by mouth at bedtime as needed for sleep. 90 tablet 1   No current facility-administered medications for this visit.    SURGICAL HISTORY:  Past Surgical History  Procedure Laterality Date  . Tubal ligation    . S/p lul lung surgury  12/2007  . Lobectomy      REVIEW OF SYSTEMS:  A comprehensive review of systems was negative except for: Gastrointestinal: positive for nausea Neurological: positive for memory problems   PHYSICAL EXAMINATION: General appearance: alert, cooperative and no distress Head: Normocephalic, without obvious abnormality, atraumatic Neck: no adenopathy Lymph nodes: Cervical, supraclavicular, and axillary nodes normal. Resp: clear to auscultation bilaterally Back: symmetric, no curvature. ROM normal. No CVA tenderness. Cardio: regular rate and rhythm, S1, S2 normal, no murmur, click, rub or gallop GI: soft, non-tender; bowel sounds normal; no masses,  no organomegaly Extremities: extremities normal, atraumatic, no cyanosis or edema Neurologic: Alert and oriented X 3, normal strength and tone. Normal symmetric reflexes. Normal coordination and gait  ECOG PERFORMANCE STATUS: 1 - Symptomatic but completely ambulatory  Blood pressure 149/92, pulse 61, temperature 98.5 F (36.9 C), temperature source Oral, resp. rate 17, height '5\' 5"'  (1.651 m), weight 123 lb 12.8 oz (56.155 kg), SpO2 100 %.  LABORATORY DATA: Lab Results  Component Value Date   WBC 4.9 05/17/2015   HGB 13.5 05/17/2015   HCT 39.9 05/17/2015   MCV 97.9 05/17/2015   PLT 250 05/17/2015      Chemistry      Component Value Date/Time   NA  138 05/17/2015 1003   NA 137 08/12/2014 1716   NA 140 04/17/2012 0855   K 3.9 05/17/2015 1003   K 3.7 08/12/2014 1716   K 4.1 04/17/2012 0855   CL 99 08/12/2014 1716   CL 100 03/04/2013 0901   CL 99 04/17/2012 0855   CO2 26 05/17/2015 1003   CO2 35* 08/12/2014 1716   CO2 29 04/17/2012 0855   BUN 14.7 05/17/2015 1003   BUN 16 08/12/2014 1716   BUN 15 04/17/2012 0855   CREATININE 1.0 05/17/2015 1003   CREATININE 1.1 08/12/2014 1716   CREATININE 1.4* 04/17/2012 0855      Component Value Date/Time   CALCIUM 9.5 05/17/2015 1003   CALCIUM 10.6* 08/12/2014 1716   CALCIUM 9.5 04/17/2012 0855   ALKPHOS 66 05/17/2015 1003   ALKPHOS 79 08/12/2014 1716   ALKPHOS 68 04/17/2012 0855   AST 19 05/17/2015 1003   AST 28 08/12/2014 1716   AST  29 04/17/2012 0855   ALT 15 05/17/2015 1003   ALT 24 08/12/2014 1716   ALT 31 04/17/2012 0855   BILITOT 0.41 05/17/2015 1003   BILITOT 0.4 08/12/2014 1716   BILITOT 0.60 04/17/2012 0855       RADIOGRAPHIC STUDIES: No results found.  ASSESSMENT AND PLAN: This is a very pleasant 74 years old white female with recurrent non-small cell lung cancer, adenocarcinoma. The patient is currently on maintenance Alimta monthly status post total of 48 cycles.  The patient has been tolerating her treatment fairly well except for few episodes of nausea and memory loss questionable to be result of her brain tumor. She is scheduled for repeat MRI of the brain by Dr. Tammi Klippel and of September 2016. I recommended for the patient to continue her current treatment with single agent Alimta as scheduled. She would proceed with cycle #49 today as scheduled. For the nausea, she will continue with Zofran and Ativan as needed. She was also referred to gastroenterology for evaluation. For the low back pain, the patient will continue on Vicodin on as-needed basis She would come back for followup visit in one month for reevaluation with the next cycle of her chemotherapy after  repeating CT scan of the chest, abdomen and pelvis for restaging of her disease. The patient was advised to call immediately if she has any concerning symptoms in the interval. The patient voices understanding of current disease status and treatment options and is in agreement with the current care plan.  All questions were answered. The patient knows to call the clinic with any problems, questions or concerns. We can certainly see the patient much sooner if necessary.  Disclaimer: This note was dictated with voice recognition software. Similar sounding words can inadvertently be transcribed and may not be corrected upon review.

## 2015-05-18 ENCOUNTER — Telehealth: Payer: Self-pay | Admitting: Internal Medicine

## 2015-05-18 NOTE — Telephone Encounter (Signed)
Patient called in to reschedule her labs    annd

## 2015-05-26 ENCOUNTER — Telehealth: Payer: Self-pay | Admitting: *Deleted

## 2015-05-26 ENCOUNTER — Ambulatory Visit: Payer: Medicare Other | Admitting: Gastroenterology

## 2015-05-26 MED ORDER — ALPRAZOLAM 0.5 MG PO TABS
0.5000 mg | ORAL_TABLET | Freq: Three times a day (TID) | ORAL | Status: DC | PRN
Start: 2015-05-26 — End: 2015-05-28

## 2015-05-26 NOTE — Telephone Encounter (Signed)
Rx faxed to pharmacy  

## 2015-05-26 NOTE — Telephone Encounter (Signed)
Called pt no answer LMOM rx fax to [facility]...Tamara Ortiz

## 2015-05-26 NOTE — Telephone Encounter (Signed)
Receive call pt is requesting refill on her Lorazepam. Pls advise...Tamara Ortiz

## 2015-05-26 NOTE — Telephone Encounter (Signed)
Done hardcopy to Dahlia  

## 2015-05-27 ENCOUNTER — Telehealth: Payer: Self-pay | Admitting: *Deleted

## 2015-05-27 NOTE — Telephone Encounter (Signed)
Patient stated that her pharmacy haven't received her prescription, please advise

## 2015-05-27 NOTE — Telephone Encounter (Signed)
Called pt to verify which medication per chart no request has came through from [facility]. LMOM to RTC...Tamara Ortiz

## 2015-05-28 MED ORDER — LORAZEPAM 0.5 MG PO TABS: 0.5000 mg | ORAL_TABLET | Freq: Two times a day (BID) | ORAL | Status: DC | PRN

## 2015-05-28 NOTE — Telephone Encounter (Signed)
Called pt no answer LMOM md ok lorazepam rx fax to [facility]...Tamara Ortiz

## 2015-05-28 NOTE — Telephone Encounter (Signed)
Done hardcopy to Dahlia  

## 2015-05-28 NOTE — Telephone Encounter (Signed)
Called.

## 2015-05-28 NOTE — Addendum Note (Signed)
Addended by: Biagio Borg on: 05/28/2015 12:22 PM   Modules accepted: Orders, Medications

## 2015-05-28 NOTE — Telephone Encounter (Signed)
Pt return call back she stated that she told md that she didn't want to take the alprazolam. She states the Lorazepam works best for her. He has rx it before in the past. Pt is wanting Lorazepam instead of Alprazolam.../lmb

## 2015-05-31 ENCOUNTER — Encounter: Payer: Self-pay | Admitting: General Practice

## 2015-05-31 NOTE — Progress Notes (Signed)
Spiritual Care Note  Followed up with Tamara Ortiz by phone for support and encouragement.  She reported increased trouble with verbal expression and thinking, including time orientation:  "I lose time with who I'm talking to.  I get lost in conversation, and I'm not able to complete a thought.  Recently I took a nap and woke up with no idea what day it was."  Her struggle to find words was noteworthy in our conversation, and she states that this development is bothersome and concerning.  With her permission, I will forward this concern to MD/RN.  [name] also shared that her vulnerability has gotten stirred up by the death of a friend who had cancer for just a year shy of her own cancer experience.  Provided pastoral presence, reflective listening, normalization of feelings, and reiterated offer of ongoing support.  She plans to attend funeral as part of her processing this loss.  [name] also verbalized that she would value some counseling to help her cope with her disease progression and increase in symptoms.  With her enthusiastic permission, I will refer to counseling intern [name] for emotional support.  Please also page as needs arise.  Thank you.  [place], [place] Pager [phone] Voicemail  [phone]

## 2015-06-08 ENCOUNTER — Other Ambulatory Visit: Payer: Medicare Other

## 2015-06-09 ENCOUNTER — Other Ambulatory Visit (HOSPITAL_BASED_OUTPATIENT_CLINIC_OR_DEPARTMENT_OTHER): Payer: Medicare Other

## 2015-06-09 ENCOUNTER — Encounter (HOSPITAL_COMMUNITY): Payer: Self-pay

## 2015-06-09 ENCOUNTER — Ambulatory Visit (HOSPITAL_COMMUNITY)
Admission: RE | Admit: 2015-06-09 | Discharge: 2015-06-09 | Disposition: A | Discharge status: Home / Self Care | Payer: Medicare Other | Source: Ambulatory Visit | Attending: Internal Medicine | Admitting: Internal Medicine

## 2015-06-09 ENCOUNTER — Other Ambulatory Visit: Payer: Self-pay | Admitting: *Deleted

## 2015-06-09 DIAGNOSIS — C20 Malignant neoplasm of rectum: Secondary | ICD-10-CM

## 2015-06-09 DIAGNOSIS — C3492 Malignant neoplasm of unspecified part of left bronchus or lung: Secondary | ICD-10-CM

## 2015-06-09 DIAGNOSIS — C7951 Secondary malignant neoplasm of bone: Secondary | ICD-10-CM

## 2015-06-09 DIAGNOSIS — C7931 Secondary malignant neoplasm of brain: Secondary | ICD-10-CM | POA: Insufficient documentation

## 2015-06-09 LAB — BASIC METABOLIC PANEL (CC13)
Anion Gap: 7 mEq/L (ref 3–11)
BUN: 10.4 mg/dL (ref 7.0–26.0)
CHLORIDE: 104 meq/L (ref 98–109)
CO2: 27 mEq/L (ref 22–29)
Calcium: 9.2 mg/dL (ref 8.4–10.4)
Creatinine: 1 mg/dL (ref 0.6–1.1)
EGFR: 58 mL/min/{1.73_m2} — AB (ref >90)
Glucose: 96 mg/dl (ref 70–140)
Potassium: 4.1 mEq/L (ref 3.5–5.1)
Sodium: 139 mEq/L (ref 136–145)

## 2015-06-09 MED ORDER — IOHEXOL 300 MG/ML  SOLN
100.0000 mL | Freq: Once | INTRAMUSCULAR | Status: AC | PRN
  Administered 2015-06-09: 100 mL via INTRAVENOUS

## 2015-06-09 MED ORDER — HYDROCODONE-ACETAMINOPHEN 5-325 MG PO TABS: 1.0000 | ORAL_TABLET | Freq: Four times a day (QID) | ORAL | Status: DC | PRN

## 2015-06-09 NOTE — Telephone Encounter (Signed)
Pt called with refill request hydrocodone.  Notified pt rx ready for pick up

## 2015-06-10 ENCOUNTER — Telehealth: Payer: Self-pay | Admitting: Medical Oncology

## 2015-06-10 ENCOUNTER — Telehealth: Payer: Self-pay | Admitting: *Deleted

## 2015-06-10 NOTE — Telephone Encounter (Signed)
REFILLED COMPLETED BY DR.MOHAMED'S NURSE, DIANE BELL,RN.

## 2015-06-10 NOTE — Telephone Encounter (Signed)
done

## 2015-06-11 ENCOUNTER — Other Ambulatory Visit: Payer: Medicare Other

## 2015-06-11 ENCOUNTER — Ambulatory Visit
Admission: RE | Admit: 2015-06-11 | Discharge: 2015-06-11 | Disposition: A | Discharge status: Home / Self Care | Payer: Medicare Other | Source: Ambulatory Visit | Attending: Radiation Oncology | Admitting: Radiation Oncology

## 2015-06-11 DIAGNOSIS — C7931 Secondary malignant neoplasm of brain: Secondary | ICD-10-CM

## 2015-06-11 MED ORDER — GADOBENATE DIMEGLUMINE 529 MG/ML IV SOLN
11.0000 mL | Freq: Once | INTRAVENOUS | Status: AC | PRN
Start: 2015-06-11 — End: 2015-06-11
  Administered 2015-06-11: 11 mL via INTRAVENOUS

## 2015-06-14 ENCOUNTER — Ambulatory Visit (HOSPITAL_BASED_OUTPATIENT_CLINIC_OR_DEPARTMENT_OTHER): Payer: Medicare Other

## 2015-06-14 ENCOUNTER — Other Ambulatory Visit (HOSPITAL_BASED_OUTPATIENT_CLINIC_OR_DEPARTMENT_OTHER): Payer: Medicare Other

## 2015-06-14 ENCOUNTER — Other Ambulatory Visit: Payer: Medicare Other

## 2015-06-14 ENCOUNTER — Ambulatory Visit
Admission: RE | Admit: 2015-06-14 | Discharge: 2015-06-14 | Disposition: A | Discharge status: Home / Self Care | Payer: Medicare Other | Source: Ambulatory Visit | Attending: Internal Medicine | Admitting: Internal Medicine

## 2015-06-14 ENCOUNTER — Telehealth: Payer: Self-pay | Admitting: Internal Medicine

## 2015-06-14 ENCOUNTER — Ambulatory Visit (HOSPITAL_BASED_OUTPATIENT_CLINIC_OR_DEPARTMENT_OTHER): Payer: Medicare Other | Admitting: Internal Medicine

## 2015-06-14 ENCOUNTER — Ambulatory Visit
Admission: RE | Admit: 2015-06-14 | Discharge: 2015-06-14 | Disposition: A | Discharge status: Home / Self Care | Payer: Medicare Other | Source: Ambulatory Visit | Attending: Radiation Oncology | Admitting: Radiation Oncology

## 2015-06-14 ENCOUNTER — Encounter: Payer: Self-pay | Admitting: Radiation Oncology

## 2015-06-14 ENCOUNTER — Encounter: Payer: Self-pay | Admitting: Internal Medicine

## 2015-06-14 VITALS — BP 139/95 | HR 62 | Temp 98.6°F | Resp 16 | Ht 65.0 in | Wt 122.8 lb

## 2015-06-14 VITALS — BP 147/82 | HR 65 | Temp 98.7°F | Resp 18 | Ht 65.0 in | Wt 124.1 lb

## 2015-06-14 DIAGNOSIS — C3412 Malignant neoplasm of upper lobe, left bronchus or lung: Secondary | ICD-10-CM

## 2015-06-14 DIAGNOSIS — C7951 Secondary malignant neoplasm of bone: Secondary | ICD-10-CM

## 2015-06-14 DIAGNOSIS — C7931 Secondary malignant neoplasm of brain: Secondary | ICD-10-CM

## 2015-06-14 DIAGNOSIS — C801 Malignant (primary) neoplasm, unspecified: Secondary | ICD-10-CM

## 2015-06-14 DIAGNOSIS — Z515 Encounter for palliative care: Secondary | ICD-10-CM

## 2015-06-14 DIAGNOSIS — F5000 Anorexia nervosa, unspecified: Secondary | ICD-10-CM

## 2015-06-14 DIAGNOSIS — R530 Neoplastic (malignant) related fatigue: Secondary | ICD-10-CM | POA: Diagnosis not present

## 2015-06-14 DIAGNOSIS — C3492 Malignant neoplasm of unspecified part of left bronchus or lung: Secondary | ICD-10-CM

## 2015-06-14 DIAGNOSIS — F411 Generalized anxiety disorder: Secondary | ICD-10-CM

## 2015-06-14 DIAGNOSIS — Z5111 Encounter for antineoplastic chemotherapy: Secondary | ICD-10-CM | POA: Diagnosis present

## 2015-06-14 LAB — COMPREHENSIVE METABOLIC PANEL (CC13)
ALT: 13 U/L (ref 0–55)
ANION GAP: 9 meq/L (ref 3–11)
AST: 20 U/L (ref 5–34)
Albumin: 3.7 g/dL (ref 3.5–5.0)
Alkaline Phosphatase: 72 U/L (ref 40–150)
BUN: 12.8 mg/dL (ref 7.0–26.0)
CALCIUM: 9.2 mg/dL (ref 8.4–10.4)
CHLORIDE: 104 meq/L (ref 98–109)
CO2: 24 mEq/L (ref 22–29)
CREATININE: 1 mg/dL (ref 0.6–1.1)
EGFR: 60 mL/min/{1.73_m2} — ABNORMAL LOW (ref >90)
Glucose: 88 mg/dl (ref 70–140)
POTASSIUM: 3.6 meq/L (ref 3.5–5.1)
Sodium: 138 mEq/L (ref 136–145)
Total Bilirubin: 0.43 mg/dL (ref 0.20–1.20)
Total Protein: 6.6 g/dL (ref 6.4–8.3)

## 2015-06-14 LAB — CBC WITH DIFFERENTIAL/PLATELET
BASO%: 1.2 % (ref 0.0–2.0)
Basophils Absolute: 0.1 10*3/uL (ref 0.0–0.1)
EOS%: 0.8 % (ref 0.0–7.0)
Eosinophils Absolute: 0 10*3/uL (ref 0.0–0.5)
HCT: 35.9 % (ref 34.8–46.6)
HGB: 12.1 g/dL (ref 11.6–15.9)
LYMPH%: 24.8 % (ref 14.0–49.7)
MCH: 33 pg (ref 25.1–34.0)
MCHC: 33.7 g/dL (ref 31.5–36.0)
MCV: 97.9 fL (ref 79.5–101.0)
MONO#: 0.5 10*3/uL (ref 0.1–0.9)
MONO%: 9.6 % (ref 0.0–14.0)
NEUT#: 3.2 10*3/uL (ref 1.5–6.5)
NEUT%: 63.6 % (ref 38.4–76.8)
PLATELETS: 258 10*3/uL (ref 145–400)
RBC: 3.66 10*6/uL — ABNORMAL LOW (ref 3.70–5.45)
RDW: 13.3 % (ref 11.2–14.5)
WBC: 5 10*3/uL (ref 3.9–10.3)
lymph#: 1.2 10*3/uL (ref 0.9–3.3)

## 2015-06-14 MED ORDER — CYANOCOBALAMIN 1000 MCG/ML IJ SOLN
1000.0000 ug | Freq: Once | INTRAMUSCULAR | Status: AC
  Administered 2015-06-14: 1000 ug via INTRAMUSCULAR

## 2015-06-14 MED ORDER — CYANOCOBALAMIN 1000 MCG/ML IJ SOLN
INTRAMUSCULAR | Status: AC
  Filled 2015-06-14: qty 1

## 2015-06-14 MED ORDER — PEMETREXED DISODIUM CHEMO INJECTION 500 MG
400.0000 mg/m2 | Freq: Once | INTRAVENOUS | Status: AC
  Administered 2015-06-14: 700 mg via INTRAVENOUS
  Filled 2015-06-14: qty 28

## 2015-06-14 MED ORDER — SODIUM CHLORIDE 0.9 % IV SOLN
Freq: Once | INTRAVENOUS | Status: AC
  Administered 2015-06-14: 15:00:00 via INTRAVENOUS

## 2015-06-14 MED ORDER — SODIUM CHLORIDE 0.9 % IV SOLN
Freq: Once | INTRAVENOUS | Status: AC
  Administered 2015-06-14: 15:00:00 via INTRAVENOUS
  Filled 2015-06-14: qty 4

## 2015-06-14 MED ORDER — ZOLEDRONIC ACID 4 MG/100ML IV SOLN
4.0000 mg | Freq: Once | INTRAVENOUS | Status: AC
  Administered 2015-06-14: 4 mg via INTRAVENOUS
  Filled 2015-06-14: qty 100

## 2015-06-14 NOTE — Patient Instructions (Signed)
[facility] Discharge Instructions for Patients Receiving Chemotherapy  Today you received the following chemotherapy agents: Alimta.  To help prevent nausea and vomiting after your treatment, we encourage you to take your nausea medication: Zofran. Take one every 8 hours as needed.   If you develop nausea and vomiting that is not controlled by your nausea medication, call the clinic.   BELOW ARE SYMPTOMS THAT SHOULD BE REPORTED IMMEDIATELY:  *FEVER GREATER THAN 100.5 F  *CHILLS WITH OR WITHOUT FEVER  NAUSEA AND VOMITING THAT IS NOT CONTROLLED WITH YOUR NAUSEA MEDICATION  *UNUSUAL SHORTNESS OF BREATH  *UNUSUAL BRUISING OR BLEEDING  TENDERNESS IN MOUTH AND THROAT WITH OR WITHOUT PRESENCE OF ULCERS  *URINARY PROBLEMS  *BOWEL PROBLEMS  UNUSUAL RASH Items with * indicate a potential emergency and should be followed up as soon as possible.  Feel free to call the clinic should you have any questions or concerns. The clinic phone number is [phone].  Please show the [facility] at check-in to the Emergency Department and triage nurse.

## 2015-06-14 NOTE — Consult Note (Signed)
Patient Tamara Ortiz      DOB: [birth year]      UXN:[id number]     Consult Note from the Palliative Medicine Team at [facility] Requested by:     PCP: [name], MD Reason for Consultation              Phone                                                                                                                                [place] of patients Current state:     Consult is for continued holistic support and symptom management as indicated supported by the Palliative Medicine Team  This NP reviewed medical records, received report from team, assessed the patient and then meet with Tamara Ortiz                    in the outpatient radiation-oncology clinic along with her friend Tamara Ortiz.  Dewey tells me she has been feeling "better" over the past month.  She has been able to relax more and feels less stressed.  Created space/opportunity for patient to share her concerns regarding her diagnosis and prognosis.   She lives alone, has one son who "understands what is going on". She worries about losing her independence and anticipatory care needs "when things get worse"  A discussion was had today regarding importance of continued discussion regarding advanced directives.  Values and goals of care important to patient and family were attempted to be elicited.  Questions and concerns addressed.    Patient  encouraged to call with questions or concerns.  PMT will continue to support holistically.   Goals of Care: 1.  Code Status:  Full code at this time, "working on it".   Encouraged to consider DNR/DNI understanding limitations of CPR in patients with terminal disease    2. Scope of Treatment:  At this time patient is open to all available and offered medcial interventions to prolong quality life.   4. Symptom Management:   Fatigue: -Pace yourself -Plan your day -Include naps and breaks -schedule a relaxing day -get a  little exercise -fuel the body -consider complementary therapies   -deep breathing   -prayer/medication   -guided meditation   Poor appetite/weight loss:  Detailed discussion had regarding nutritional tips.  Today patietn was able to verbalize her life long struggle with food  -small frequent meals, grazing  - High protein foods  - importance of socialization  - half glass of red wine for stimulant  -supplement drinks, Ensure, Breeze, [employer] friend present is going to coordinate dinner delivery  "Foggy memory"  -discussed importance of continued use of her brain; solitaire, luminosity, word finds compensatory strategies;  post its, calenders, phone reminders     Psychosocial:  Emotional support offered to patient  Continued conversation regarding  difficulty of living with a life limiting disease  and finding meaning in everyday.    6. 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   PRIOR THERAPY:  1. Status post left upper lobectomy with mediastinal lymph node dissection under the care of Dr. Roxan Hockey on December 10, 2007.  2. Status post 4 cycles of adjuvant chemotherapy with cisplatin and Taxotere. Last dose was given 03/22/2008.  3. Status post 6 cycles of systemic chemotherapy with carboplatin and Alimta for metastatic disease in the bone. The last dose was given July 02, 2010, with stable disease.  4. Status post 12 cycles of maintenance Alimta at 500 mg/sq m given every 3 weeks. The last dose was given 05/08/2011. 5. Stereotactic radiotherapy to metastatic brain lesions under the care of Dr. Tammi Klippel completed on 07/30/2014.  CURRENT THERAPY:  2. Maintenance Alimta at 500 mg/sq m given every  4 weeks. The patient is status post 47 cycles.  3. Zometa 4 mg IV given every 2 months for bone metastasis.   ROS:  Continued fatigue, poor appetite    PMH:  Past Medical History  Diagnosis Date  . Hypertension   . HYPERLIPIDEMIA 11/05/2007  . ANXIETY 05/13/2009  . DEPRESSIVE DISORDER 05/06/2008  . HYPERTENSION 11/05/2007  . ALLERGIC RHINITIS 11/05/2007  . CONSTIPATION, CHRONIC 05/13/2009  . UTI 05/06/2008  . BACK PAIN 05/11/2008  . OSTEOPOROSIS 11/05/2007  . INSOMNIA-SLEEP DISORDER-UNSPEC 05/13/2009  . FATIGUE 05/13/2009  . Swelling, mass, or lump in chest 11/05/2007  . LUNG CANCER, HX OF 05/06/2008  . 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 Lung cancer   . Brain cancer      PSH: Past Surgical History  Procedure Laterality Date  . Tubal ligation    . S/p lul lung surgury  12/2007  . Lobectomy     I have reviewed the FH and SH and  If appropriate update it with new information. Allergies  Allergen Reactions  . Amoxicillin Hives and Itching  . Levaquin [Levofloxacin] Nausea And Vomiting   Scheduled Meds: Continuous Infusions: PRN Meds:.    There were no vitals taken for this visit.     No intake or output data in the 24 hours ending 06/14/15 1318   Physical Exam:  General:  Appears well, smiling and engaged HEENT:  Moist buccal membranes, no exudate Neuro: moves all four extremities,   Labs: CBC    Component Value Date/Time   WBC 4.9 05/17/2015 1003   WBC 3.6* 08/12/2014 1716   RBC 4.07 05/17/2015 1003   RBC 3.77* 08/12/2014 1716   HGB 13.5 05/17/2015 1003   HGB 12.4 08/12/2014 1716   HCT 39.9 05/17/2015 1003   HCT 36.2 08/12/2014 1716   PLT 250 05/17/2015 1003   PLT 182.0 08/12/2014 1716   MCV 97.9 05/17/2015 1003   MCV 96.0 08/12/2014 1716   MCH 33.2 05/17/2015 1003   MCH 33.0 05/06/2011 1733   MCHC 33.9 05/17/2015 1003   MCHC 34.1 08/12/2014 1716   RDW 13.1 05/17/2015 1003   RDW 12.4 08/12/2014 1716   LYMPHSABS 0.9 05/17/2015 1003    LYMPHSABS 2.0 08/12/2014 1716   MONOABS 0.5 05/17/2015 1003   MONOABS 0.5 08/12/2014 1716   EOSABS 0.0 05/17/2015 1003   EOSABS 0.0 08/12/2014 1716   BASOSABS  0.1 05/17/2015 1003   BASOSABS 0.0 08/12/2014 1716    BMET    Component Value Date/Time   NA 139 06/09/2015 0959   NA 137 08/12/2014 1716   NA 140 04/17/2012 0855   K 4.1 06/09/2015 0959   K 3.7 08/12/2014 1716   K 4.1 04/17/2012 0855   CL 99 08/12/2014 1716   CL 100 03/04/2013 0901   CL 99 04/17/2012 0855   CO2 27 06/09/2015 0959   CO2 35* 08/12/2014 1716   CO2 29 04/17/2012 0855   GLUCOSE 96 06/09/2015 0959   GLUCOSE 105* 08/12/2014 1716   GLUCOSE 85 03/04/2013 0901   GLUCOSE 95 04/17/2012 0855   BUN 10.4 06/09/2015 0959   BUN 16 08/12/2014 1716   BUN 15 04/17/2012 0855   CREATININE 1.0 06/09/2015 0959   CREATININE 1.1 08/12/2014 1716   CREATININE 1.4* 04/17/2012 0855   CALCIUM 9.2 06/09/2015 0959   CALCIUM 10.6* 08/12/2014 1716   CALCIUM 9.5 04/17/2012 0855   GFRNONAA 57* 05/06/2011 1733   GFRAA >60 05/06/2011 1733    CMP     Component Value Date/Time   NA 139 06/09/2015 0959   NA 137 08/12/2014 1716   NA 140 04/17/2012 0855   K 4.1 06/09/2015 0959   K 3.7 08/12/2014 1716   K 4.1 04/17/2012 0855   CL 99 08/12/2014 1716   CL 100 03/04/2013 0901   CL 99 04/17/2012 0855   CO2 27 06/09/2015 0959   CO2 35* 08/12/2014 1716   CO2 29 04/17/2012 0855   GLUCOSE 96 06/09/2015 0959   GLUCOSE 105* 08/12/2014 1716   GLUCOSE 85 03/04/2013 0901   GLUCOSE 95 04/17/2012 0855   BUN 10.4 06/09/2015 0959   BUN 16 08/12/2014 1716   BUN 15 04/17/2012 0855   CREATININE 1.0 06/09/2015 0959   CREATININE 1.1 08/12/2014 1716   CREATININE 1.4* 04/17/2012 0855   CALCIUM 9.2 06/09/2015 0959   CALCIUM 10.6* 08/12/2014 1716   CALCIUM 9.5 04/17/2012 0855   PROT 6.8 05/17/2015 1003   PROT 6.8 08/12/2014 1716   PROT 6.9 04/17/2012 0855   ALBUMIN 3.8 05/17/2015 1003   ALBUMIN 3.4* 08/12/2014 1716   AST 19 05/17/2015 1003    AST 28 08/12/2014 1716   AST 29 04/17/2012 0855   ALT 15 05/17/2015 1003   ALT 24 08/12/2014 1716   ALT 31 04/17/2012 0855   ALKPHOS 66 05/17/2015 1003   ALKPHOS 79 08/12/2014 1716   ALKPHOS 68 04/17/2012 0855   BILITOT 0.41 05/17/2015 1003   BILITOT 0.4 08/12/2014 1716   BILITOT 0.60 04/17/2012 0855   GFRNONAA 57* 05/06/2011 1733   GFRAA >60 05/06/2011 1733   ECOG PERFORMANCE STATUS* (Eastern Cooperative Oncology Group)  0 Fully active, able to continue with all pre-disease activities without restriction. Pt score  1 Restricted in physically strenuous activity but ambulatory and able to carry out work of a light or sedentary nature, e.g., light house work, office work. 1  2 Ambulatory and capable of all self-care but unable to carry out any work activities. Up and about more than 50% of waking hours.    3 Capable of only limited self-care. Confined to bed or chair more than 50% of waking hours.   4 Completely disabled. Cannot carry on any self-care. Totally confined to bed or chair.   5 Dead.    As published in Am. J. Clin. Oncol.: Eustace Pen, M.M., Colon Flattery., Blanchard, D.C., Horton, Sharen Hint., Drexel Iha, P.P.: Toxicity And  Response Criteria Of [facility]. Corder 5:449-201, 1982.  The ECOG Performance Status is in the public domain therefore available for public use. To duplicate the scale, please cite the reference above and credit the [facility], Tyler Pita M.D., Group Chair    Time In Time Out Total Time Spent with Patient Total Overall Time  1200 1315 75 min  75 min    Greater than 50%  of this time was spent counseling and coordinating care related to the above assessment and plan.   Wadie Lessen NP  Palliative Medicine Team Team Phone # [phone] Pager [phone]  Discussed with Dr Tammi Klippel

## 2015-06-14 NOTE — Telephone Encounter (Signed)
Gave adn printed appt sched and avs for pt for OCT and NOV

## 2015-06-14 NOTE — Progress Notes (Signed)
Tamara Ortiz here for follow up.  She denies having any pain, headaches and nausea. She reports feeling dizzy/off balance sometimes.  She reports having trouble with her short term memory.  She reports having blurry vision that started a couple of months ago.  She reports having a poor appetite.  She is not taking decadron.  BP 139/95 mmHg  Pulse 62  Temp(Src) 98.6 F (37 C) (Oral)  Resp 16  Ht '5\' 5"'$  (1.651 m)  Wt 122 lb 12.8 oz (55.702 kg)  BMI 20.44 kg/m2  SpO2 100%   Wt Readings from Last 3 Encounters:  06/14/15 122 lb 12.8 oz (55.702 kg)  06/11/15 125 lb (56.7 kg)  05/17/15 123 lb 12.8 oz (56.155 kg)

## 2015-06-14 NOTE — Progress Notes (Signed)
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   PRIOR THERAPY:  1. Status post left upper lobectomy with mediastinal lymph node dissection under the care of Dr. Roxan Hockey on December 10, 2007.  2. Status post 4 cycles of adjuvant chemotherapy with cisplatin and Taxotere. Last dose was given 03/22/2008.  3. Status post 6 cycles of systemic chemotherapy with carboplatin and Alimta for metastatic disease in the bone. The last dose was given July 02, 2010, with stable disease.  4. Status post 12 cycles of maintenance Alimta at 500 mg/sq m given every 3 weeks. The last dose was given 05/08/2011. 5. Stereotactic radiotherapy to metastatic brain lesions under the care of Dr. Tammi Klippel completed on 07/30/2014.  CURRENT THERAPY:  1. Maintenance Alimta at 500 mg/sq m given every 4 weeks. The patient is status post 49 cycles.  2. Zometa 4 mg IV given every 2 months for bone metastasis.   CHEMOTHERAPY INTENT: Palliative  CURRENT # OF CHEMOTHERAPY CYCLES: 50 CURRENT ANTIEMETICS: none  CURRENT SMOKING STATUS: Never smoker   ORAL CHEMOTHERAPY AND CONSENT: n/a  CURRENT BISPHOSPHONATES USE: Yes, Zometa given every 2 months  PAIN MANAGEMENT: [employer]co  NARCOTICS INDUCED CONSTIPATION: Occasional, uses MiraLax  LIVING WILL AND CODE STATUS: Has a living will   INTERVAL HISTORY: Tamara Ortiz 74 y.o. female returns to the clinic today for followup visit. The patient is feeling fine today with no specific complaints except for  Mild fatigue and some memory issues. She  She also has mild nausea and is currently on Zofran for nausea.  She discontinued Ativan. She is scheduled to have  upper endoscopy by Dr. Gwenlyn Found  In early October 2016. She denied having any significant weight loss or night sweats. She has no chest pain, shortness of breath, cough or hemoptysis. She has no fever or chills. She is tolerating her treatment with Alimta fairly well with no significant adverse effects.   She had recent CT scan of the chest, abdomen and pelvis in addition to brain MRI and she is here for evaluation and discussion of her imaging studies before starting cycle #50 of her chemotherapy.  MEDICAL HISTORY: Past Medical History  Diagnosis Date  . Hypertension   . HYPERLIPIDEMIA 11/05/2007  . ANXIETY 05/13/2009  . DEPRESSIVE DISORDER 05/06/2008  . HYPERTENSION 11/05/2007  . ALLERGIC RHINITIS 11/05/2007  . CONSTIPATION, CHRONIC 05/13/2009  . UTI 05/06/2008  . BACK PAIN 05/11/2008  . OSTEOPOROSIS 11/05/2007  . INSOMNIA-SLEEP DISORDER-UNSPEC 05/13/2009  . FATIGUE 05/13/2009  . Swelling, mass, or lump in chest 11/05/2007  . LUNG CANCER, HX OF 05/06/2008  . 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 Lung cancer   . Brain cancer     ALLERGIES:  is allergic to amoxicillin and levaquin.  MEDICATIONS:  Current Outpatient Prescriptions  Medication Sig Dispense Refill  . Alum & Mag Hydroxide-Simeth (MAGIC MOUTHWASH) SOLN Take 5 mLs by mouth 4 (four) times daily as needed for mouth pain. Swish and spit (Patient not taking: Reported on 05/17/2015) 240 mL 0  . aspirin 81 MG EC tablet Take 81 mg by mouth daily.      [name]  Calcium Carbonate-Vitamin D (CALCIUM-VITAMIN D) 500-200 MG-UNIT per tablet Take 1 tablet by mouth 2 (two) times daily.      . folic acid (FOLVITE) 1 MG tablet TAKE 1 TABLET BY MOUTH DAILY 90 tablet 0  . HYDROcodone-acetaminophen (NORCO) 5-325 MG per tablet Take 1 tablet by mouth every 6 (six) hours as needed for moderate pain. 30 tablet 0  . LORazepam (ATIVAN) 0.5 MG tablet Take 1 tablet (0.5 mg total) by mouth 2 (two) times daily as needed for anxiety. (Patient not taking: Reported on  06/14/2015) 60 tablet 2  . Multiple Vitamin (ANTIOXIDANT A/C/E PO) Take by mouth daily.      . ondansetron (ZOFRAN ODT) 8 MG disintegrating tablet Take 1 tablet (8 mg total) by mouth every 8 (eight) hours as needed for nausea or vomiting. (Patient not taking: Reported on 06/14/2015) 30 tablet 1  . pentoxifylline (TRENTAL) 400 MG CR tablet Take 400 mg by mouth 2 (two) times daily.  2  . polyethylene glycol powder (MIRALAX) powder Take 17 g by mouth daily.      [name] zolendronic acid (ZOMETA) 4 MG/5ML injection Inject 4 mg into the vein once.      [name] zolpidem (AMBIEN CR) 6.25 MG CR tablet Take 1 tablet (6.25 mg total) by mouth at bedtime as needed for sleep. 90 tablet 1   No current facility-administered medications for this visit.    SURGICAL HISTORY:  Past Surgical History  Procedure Laterality Date  . Tubal ligation    . S/p lul lung surgury  12/2007  . Lobectomy      REVIEW OF SYSTEMS:  Constitutional: positive for fatigue Eyes: negative Ears, nose, mouth, throat, and face: negative Respiratory: negative Cardiovascular: negative Gastrointestinal: negative Genitourinary:negative Integument/breast: negative Hematologic/lymphatic: negative Musculoskeletal:negative Neurological: negative Behavioral/Psych: negative Endocrine: negative Allergic/Immunologic: negative   PHYSICAL EXAMINATION: General appearance: alert, cooperative and no distress Head: Normocephalic, without obvious abnormality, atraumatic Neck: no adenopathy Lymph nodes: Cervical, supraclavicular, and axillary nodes normal. Resp: clear to auscultation bilaterally Back: symmetric, no curvature. ROM normal. No CVA tenderness. Cardio: regular rate and rhythm, S1, S2 normal, no murmur, click, rub or gallop GI: soft, non-tender; bowel sounds normal; no masses,  no organomegaly Extremities: extremities normal, atraumatic, no cyanosis or edema Neurologic: Alert and oriented X 3, normal strength and tone. Normal symmetric  reflexes. Normal coordination and gait  ECOG PERFORMANCE STATUS: 1 - Symptomatic but completely ambulatory  Blood pressure 147/82, pulse 65, temperature 98.7 F (37.1 C), temperature source Oral, resp. rate 18, height 5' 5" (1.651 m), weight 124 lb 1.6 oz (56.291 kg), SpO2 100 %.  LABORATORY DATA: Lab Results  Component Value Date   WBC 5.0 06/14/2015   HGB 12.1 06/14/2015   HCT 35.9 06/14/2015   MCV 97.9 06/14/2015   PLT 258 06/14/2015      Chemistry      Component Value Date/Time   NA 139 06/09/2015 0959   NA 137 08/12/2014 1716   NA 140 04/17/2012 0855   K 4.1 06/09/2015 0959   K 3.7 08/12/2014 1716   K 4.1 04/17/2012 0855   CL 99 08/12/2014 1716   CL 100 03/04/2013 0901   CL 99 04/17/2012 0855   CO2 27 06/09/2015 0959   CO2 35* 08/12/2014 1716   CO2 29 04/17/2012 0855   BUN 10.4 06/09/2015 0959   BUN 16 08/12/2014 1716   BUN 15 04/17/2012 0855   CREATININE 1.0 06/09/2015 0959   CREATININE 1.1 08/12/2014 1716   CREATININE 1.4* 04/17/2012  0855      Component Value Date/Time   CALCIUM 9.2 06/09/2015 0959   CALCIUM 10.6* 08/12/2014 1716   CALCIUM 9.5 04/17/2012 0855   ALKPHOS 66 05/17/2015 1003   ALKPHOS 79 08/12/2014 1716   ALKPHOS 68 04/17/2012 0855   AST 19 05/17/2015 1003   AST 28 08/12/2014 1716   AST 29 04/17/2012 0855   ALT 15 05/17/2015 1003   ALT 24 08/12/2014 1716   ALT 31 04/17/2012 0855   BILITOT 0.41 05/17/2015 1003   BILITOT 0.4 08/12/2014 1716   BILITOT 0.60 04/17/2012 0855       RADIOGRAPHIC STUDIES: Ct Chest W Contrast  06/09/2015   CLINICAL DATA:  Non-small cell lung cancer.  Bone metastasis.  EXAM: CT CHEST, ABDOMEN, AND PELVIS WITH CONTRAST  TECHNIQUE: Multidetector CT imaging of the chest, abdomen and pelvis was performed following the standard protocol during bolus administration of intravenous contrast.  CONTRAST:  1100m OMNIPAQUE IOHEXOL 300 MG/ML  SOLN  COMPARISON:  None.  03/19/2015  FINDINGS: CT CHEST FINDINGS  Mediastinum: Heart  size is normal. No pericardial effusion. The trachea appears patent and is midline. Normal appearance of the esophagus. There is no mediastinal or hilar adenopathy. No axillary or supraclavicular adenopathy.  Lungs/Pleura: No pleural effusion. Stable right middle lobe nodule measuring 3 mm, image 28/series 5. Stable left lower lobe lung nodule is also unchanged measuring 4 mm, image 28/series 5. Also in the left lower lobe is a stable nodule measuring 4 mm, image 28//series 5.  Musculoskeletal: T7 sclerotic metastasis is unchanged from previous exam. No evidence for progressive bone metastases.  CT ABDOMEN AND PELVIS FINDINGS  Hepatobiliary: No suspicious liver abnormality. The gallbladder is normal. No biliary dilatation.  Pancreas: Normal appearance of the pancreas.  Spleen: Normal appearance of the spleen.  Adrenals/Urinary Tract: The adrenal glands are both unremarkable. Unremarkable appearance of both kidneys. The urinary bladder appears normal.  Stomach/Bowel: The stomach is within normal limits. The small bowel loops have a normal course and caliber. No obstruction. Normal appearance of the colon. The appendix is visualized and appears normal.  Vascular/Lymphatic: Calcified atherosclerotic disease involves the abdominal aorta. No aneurysm. No enlarged retroperitoneal or mesenteric adenopathy. No enlarged pelvic or inguinal lymph nodes.  Reproductive: The uterus and the adnexal structures are within normal limits.  Other: There is no ascites or focal fluid collections within the abdomen or pelvis.  Musculoskeletal: Degenerative disc disease is identified within the lumbar spine. There is also lower facet hypertrophy and degenerative change. Stable 2.2 cm sclerotic lesion within the central portion of the sacrum. Also stable is a 3.3 cm sclerotic lesion within the left iliac bone. No new or progressive bone metastases identified.  IMPRESSION: 1. Stable sclerotic lesions within the spine and pelvis compatible  with treated metastasis. 2. No evidence of disease progression or local tumor recurrence within the chest abdomen or pelvis. 3. Stable small pulmonary nodules.   Electronically Signed   By: TKerby MoorsM.D.   On: 06/09/2015 13:36   Mr BJeri CosWHUContrast  06/11/2015   CLINICAL DATA:  S RS 12 month restaging. Metastatic left upper lobe lung cancer treated with radiation.  EXAM: MRI HEAD WITHOUT AND WITH CONTRAST  TECHNIQUE: Multiplanar, multiecho pulse sequences of the brain and surrounding structures were obtained without and with intravenous contrast.  CONTRAST:  167mMULTIHANCE GADOBENATE DIMEGLUMINE 529 MG/ML IV SOLN  COMPARISON:  03/30/2015 and multiple previous  FINDINGS: Diffusion imaging does not show any acute or subacute infarction.  No abnormality is seen affecting the brainstem or cerebellum. The cerebral hemispheres show generalized atrophy with chronic small-vessel ischemic changes of the white matter which appear the same.  Treated metastatic lesions in the right frontal region are unchanged. The largest focus is 8.8 mm in diameter with a central necrotic change on image 127. Mild surrounding white matter signal is unchanged. This lesion is no larger, but shows more central necrosis, probably secondary to radiation affect. Small early age and MR posteriorly and towards the vertex on image 136 is unchanged at 7 mm. Abnormal enhancement along the surface of a right frontal gyrus with the epicenter in the region from image 118 12/01/2022 does not appear progressive. The largest nodular focus measures about 4-5 mm. Small foci of enhancement within a sulcus in the left frontal region appears unchanged.  There is a left 0 meningeal enhancement in the midline just behind the corpus callosum are again demonstrated but appear slightly less prominent. Other small foci of leptomeningeal enhancement appear unchanged. No new or progressive lesions.  No hydrocephalus. No extra-axial collection. Mild mucosal  inflammation in the right division of the sphenoid sinus.  IMPRESSION: No worrisome change since the previous study. Three treated foci in the right frontal lobe do not appear progressive. There slightly more central necrosis of the largest right frontal lesion, suggesting this is secondary to treatment effect.  Other chronic areas of leptomeningeal enhancement are not progressive. 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   Electronically Signed   By: Nelson Chimes M.D.   On: 06/11/2015 13:07   Ct Abdomen Pelvis W Contrast  06/09/2015   CLINICAL DATA:  Non-small cell lung cancer.  Bone metastasis.  EXAM: CT CHEST, ABDOMEN, AND PELVIS WITH CONTRAST  TECHNIQUE: Multidetector CT imaging of the chest, abdomen and pelvis was performed following the standard protocol during bolus administration of intravenous contrast.  CONTRAST:  158m OMNIPAQUE IOHEXOL 300 MG/ML  SOLN  COMPARISON:  None.  03/19/2015  FINDINGS: CT CHEST FINDINGS  Mediastinum: Heart size is normal. No pericardial effusion. The trachea appears patent and is midline. Normal appearance of the esophagus. There is no mediastinal or hilar adenopathy. No axillary or supraclavicular adenopathy.  Lungs/Pleura: No pleural effusion. Stable right middle lobe nodule measuring 3 mm, image 28/series 5. Stable left lower lobe lung nodule is also unchanged measuring 4 mm, image 28/series 5. Also in the left lower lobe is a stable nodule measuring 4 mm, image 28//series 5.  Musculoskeletal: T7 sclerotic metastasis is unchanged from previous exam. No evidence for progressive bone metastases.  CT ABDOMEN AND PELVIS FINDINGS  Hepatobiliary: No suspicious liver abnormality. The gallbladder is normal. No biliary dilatation.  Pancreas: Normal appearance of the pancreas.  Spleen: Normal appearance of the spleen.  Adrenals/Urinary Tract: The adrenal glands are both unremarkable. Unremarkable  appearance of both kidneys. The urinary bladder appears normal.  Stomach/Bowel: The stomach is within normal limits. The small bowel loops have a normal course and caliber. No obstruction. Normal appearance of the colon. The appendix is visualized and appears normal.  Vascular/Lymphatic: Calcified atherosclerotic disease involves the abdominal aorta. No aneurysm. No enlarged retroperitoneal or mesenteric adenopathy. No enlarged pelvic or inguinal lymph nodes.  Reproductive: The uterus and the adnexal structures are within normal limits.  Other: There is no ascites or focal fluid collections within the abdomen or pelvis.  Musculoskeletal: Degenerative disc disease is identified within the lumbar spine. There is also lower facet hypertrophy  and degenerative change. Stable 2.2 cm sclerotic lesion within the central portion of the sacrum. Also stable is a 3.3 cm sclerotic lesion within the left iliac bone. No new or progressive bone metastases identified.  IMPRESSION: 1. Stable sclerotic lesions within the spine and pelvis compatible with treated metastasis. 2. No evidence of disease progression or local tumor recurrence within the chest abdomen or pelvis. 3. Stable small pulmonary nodules.   Electronically Signed   By: Kerby Moors M.D.   On: 06/09/2015 13:36    ASSESSMENT AND PLAN: This is a very pleasant 74 years old white female with recurrent non-small cell lung cancer, adenocarcinoma. The patient is currently on maintenance Alimta monthly status post total of 48 cycles.  The patient has been tolerating her treatment fairly well except for few episodes of nausea , fatigue and memory loss. Her recent MRI of the brain as well as CT scan of the chest, abdomen and pelvis showed no evidence for disease progression. I discussed the scan results with the patient today.  I recommended for the patient to continue her systemic chemotherapy with single agent Alimta as a scheduled. She will receive cycle #50  today. For the low back pain, the patient will continue on Vicodin on as-needed basis She would come back for followup visit in one month for reevaluation with the next cycle of her chemotherapy. The patient was advised to call immediately if she has any concerning symptoms in the interval. The patient voices understanding of current disease status and treatment options and is in agreement with the current care plan.  All questions were answered. The patient knows to call the clinic with any problems, questions or concerns. We can certainly see the patient much sooner if necessary.  Disclaimer: This note was dictated with voice recognition software. Similar sounding words can inadvertently be transcribed and may not be corrected upon review.

## 2015-06-14 NOTE — Progress Notes (Signed)
Radiation Oncology         [phone]   Name: Tamara Ortiz   Date: 06/14/2015   MRN: [id number]  DOB: August 04, 1941    [facility] Follow-Up Visit Note  CC: Tamara Cower, MD  Tamara Bears, MD    ICD-9-CM ICD-10-CM   1. 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 She denies having any pain or headaches. She reports continued nausea and emesis with emesis x3 last Saturday. She reports feeling dizzy/off balance sometimes. She reports having trouble with her short term memory and having blurry vision that started a couple of months ago. She also reports having a poor appetite. She is not taking decadron. She has stopped taking her Ativan. [name], NP was present during this encounter.  ALLERGIES:  is allergic to amoxicillin and levaquin.  Meds: Current Outpatient Prescriptions  Medication Sig Dispense Refill  . Calcium Carbonate-Vitamin D (CALCIUM-VITAMIN D) 500-200 MG-UNIT per tablet Take 1 tablet by mouth 2 (two) times daily.      . folic acid (FOLVITE) 1 MG tablet TAKE 1 TABLET BY MOUTH DAILY 90 tablet 0  . HYDROcodone-acetaminophen (NORCO) 5-325 MG per tablet Take 1 tablet by mouth every 6 (six) hours as needed for moderate pain. 30 tablet 0  . polyethylene glycol powder (MIRALAX) powder Take 17 g by mouth daily.      [name] zolendronic acid (ZOMETA) 4 MG/5ML injection Inject 4 mg into the vein once.      [name] zolpidem (AMBIEN CR) 6.25 MG CR tablet Take 1 tablet (6.25 mg total) by  mouth at bedtime as needed for sleep. 90 tablet 1  . Alum & Mag Hydroxide-Simeth (MAGIC MOUTHWASH) SOLN Take 5 mLs by mouth 4 (four) times daily as needed for mouth pain. Swish and spit (Patient not taking: Reported on 05/17/2015) 240 mL 0  . aspirin 81 MG EC tablet Take 81 mg by mouth daily.      [name] LORazepam (ATIVAN) 0.5 MG tablet Take 1 tablet (0.5 mg total) by mouth 2 (two) times daily as needed for anxiety. (Patient not taking: Reported on 06/14/2015) 60 tablet 2  . Multiple Vitamin (ANTIOXIDANT A/C/E PO) Take by mouth daily.      . ondansetron (ZOFRAN ODT) 8 MG disintegrating tablet Take 1 tablet (8 mg total) by mouth every 8 (eight) hours as needed for nausea or vomiting. (Patient not taking: Reported on 06/14/2015) 30 tablet 1  . pentoxifylline (TRENTAL) 400 MG CR tablet Take 400 mg by mouth 2 (two) times daily.  2   No current facility-administered medications for this encounter.    Physical Findings: The patient is in no acute distress. Patient is alert and oriented.  height is '5\' 5"'$  (1.651 m) and weight is 122 lb 12.8 oz (55.702 kg). Her oral temperature is 98.6 F (37 C). Her blood pressure is 139/95 and her pulse is 62. Her respiration is 16 and oxygen saturation is 100%.  No significant changes. Neuro non-focal.  Lab Findings: Lab Results  Component Value Date   WBC 4.9 05/17/2015  HGB 13.5 05/17/2015   HCT 39.9 05/17/2015   MCV 97.9 05/17/2015   PLT 250 05/17/2015   Radiographic Findings: Ct Chest W Contrast  06/09/2015   CLINICAL DATA:  Non-small cell lung cancer.  Bone metastasis.  EXAM: CT CHEST, ABDOMEN, AND PELVIS WITH CONTRAST  TECHNIQUE: Multidetector CT imaging of the chest, abdomen and pelvis was performed following the standard protocol during bolus administration of intravenous contrast.  CONTRAST:  148m OMNIPAQUE IOHEXOL 300 MG/ML  SOLN  COMPARISON:  None.  03/19/2015  FINDINGS: CT CHEST FINDINGS  Mediastinum: Heart size is normal. No pericardial effusion. The  trachea appears patent and is midline. Normal appearance of the esophagus. There is no mediastinal or hilar adenopathy. No axillary or supraclavicular adenopathy.  Lungs/Pleura: No pleural effusion. Stable right middle lobe nodule measuring 3 mm, image 28/series 5. Stable left lower lobe lung nodule is also unchanged measuring 4 mm, image 28/series 5. Also in the left lower lobe is a stable nodule measuring 4 mm, image 28//series 5.  Musculoskeletal: T7 sclerotic metastasis is unchanged from previous exam. No evidence for progressive bone metastases.  CT ABDOMEN AND PELVIS FINDINGS  Hepatobiliary: No suspicious liver abnormality. The gallbladder is normal. No biliary dilatation.  Pancreas: Normal appearance of the pancreas.  Spleen: Normal appearance of the spleen.  Adrenals/Urinary Tract: The adrenal glands are both unremarkable. Unremarkable appearance of both kidneys. The urinary bladder appears normal.  Stomach/Bowel: The stomach is within normal limits. The small bowel loops have a normal course and caliber. No obstruction. Normal appearance of the colon. The appendix is visualized and appears normal.  Vascular/Lymphatic: Calcified atherosclerotic disease involves the abdominal aorta. No aneurysm. No enlarged retroperitoneal or mesenteric adenopathy. No enlarged pelvic or inguinal lymph nodes.  Reproductive: The uterus and the adnexal structures are within normal limits.  Other: There is no ascites or focal fluid collections within the abdomen or pelvis.  Musculoskeletal: Degenerative disc disease is identified within the lumbar spine. There is also lower facet hypertrophy and degenerative change. Stable 2.2 cm sclerotic lesion within the central portion of the sacrum. Also stable is a 3.3 cm sclerotic lesion within the left iliac bone. No new or progressive bone metastases identified.  IMPRESSION: 1. Stable sclerotic lesions within the spine and pelvis compatible with treated metastasis. 2. No evidence of  disease progression or local tumor recurrence within the chest abdomen or pelvis. 3. Stable small pulmonary nodules.   Electronically Signed   By: TKerby MoorsM.D.   On: 06/09/2015 13:36   Mr BJeri CosWGGContrast  06/11/2015   CLINICAL DATA:  S RS 12 month restaging. Metastatic left upper lobe lung cancer treated with radiation.  EXAM: MRI HEAD WITHOUT AND WITH CONTRAST  TECHNIQUE: Multiplanar, multiecho pulse sequences of the brain and surrounding structures were obtained without and with intravenous contrast.  CONTRAST:  128mMULTIHANCE GADOBENATE DIMEGLUMINE 529 MG/ML IV SOLN  COMPARISON:  03/30/2015 and multiple previous  FINDINGS: Diffusion imaging does not show any acute or subacute infarction. No abnormality is seen affecting the brainstem or cerebellum. The cerebral hemispheres show generalized atrophy with chronic small-vessel ischemic changes of the white matter which appear the same.  Treated metastatic lesions in the right frontal region are unchanged. The largest focus is 8.8 mm in diameter with a central necrotic change on image 127. Mild surrounding white matter signal is unchanged. This lesion is no larger, but shows more central necrosis, probably secondary to radiation affect. Small early age and MR posteriorly and towards  the vertex on image 136 is unchanged at 7 mm. Abnormal enhancement along the surface of a right frontal gyrus with the epicenter in the region from image 118 12/01/2022 does not appear progressive. The largest nodular focus measures about 4-5 mm. Small foci of enhancement within a sulcus in the left frontal region appears unchanged.  There is a left 0 meningeal enhancement in the midline just behind the corpus callosum are again demonstrated but appear slightly less prominent. Other small foci of leptomeningeal enhancement appear unchanged. No new or progressive lesions.  No hydrocephalus. No extra-axial collection. Mild mucosal inflammation in the right division of the  sphenoid sinus.  IMPRESSION: No worrisome change since the previous study. Three treated foci in the right frontal lobe do not appear progressive. There slightly more central necrosis of the largest right frontal lesion, suggesting this is secondary to treatment effect.  Other chronic areas of leptomeningeal enhancement are not progressive. 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   Electronically Signed   By: Nelson Chimes M.D.   On: 06/11/2015 13:07   Ct Abdomen Pelvis W Contrast  06/09/2015   CLINICAL DATA:  Non-small cell lung cancer.  Bone metastasis.  EXAM: CT CHEST, ABDOMEN, AND PELVIS WITH CONTRAST  TECHNIQUE: Multidetector CT imaging of the chest, abdomen and pelvis was performed following the standard protocol during bolus administration of intravenous contrast.  CONTRAST:  153m OMNIPAQUE IOHEXOL 300 MG/ML  SOLN  COMPARISON:  None.  03/19/2015  FINDINGS: CT CHEST FINDINGS  Mediastinum: Heart size is normal. No pericardial effusion. The trachea appears patent and is midline. Normal appearance of the esophagus. There is no mediastinal or hilar adenopathy. No axillary or supraclavicular adenopathy.  Lungs/Pleura: No pleural effusion. Stable right middle lobe nodule measuring 3 mm, image 28/series 5. Stable left lower lobe lung nodule is also unchanged measuring 4 mm, image 28/series 5. Also in the left lower lobe is a stable nodule measuring 4 mm, image 28//series 5.  Musculoskeletal: T7 sclerotic metastasis is unchanged from previous exam. No evidence for progressive bone metastases.  CT ABDOMEN AND PELVIS FINDINGS  Hepatobiliary: No suspicious liver abnormality. The gallbladder is normal. No biliary dilatation.  Pancreas: Normal appearance of the pancreas.  Spleen: Normal appearance of the spleen.  Adrenals/Urinary Tract: The adrenal glands are both unremarkable. Unremarkable appearance of both kidneys. The urinary bladder  appears normal.  Stomach/Bowel: The stomach is within normal limits. The small bowel loops have a normal course and caliber. No obstruction. Normal appearance of the colon. The appendix is visualized and appears normal.  Vascular/Lymphatic: Calcified atherosclerotic disease involves the abdominal aorta. No aneurysm. No enlarged retroperitoneal or mesenteric adenopathy. No enlarged pelvic or inguinal lymph nodes.  Reproductive: The uterus and the adnexal structures are within normal limits.  Other: There is no ascites or focal fluid collections within the abdomen or pelvis.  Musculoskeletal: Degenerative disc disease is identified within the lumbar spine. There is also lower facet hypertrophy and degenerative change. Stable 2.2 cm sclerotic lesion within the central portion of the sacrum. Also stable is a 3.3 cm sclerotic lesion within the left iliac bone. No new or progressive bone metastases identified.  IMPRESSION: 1. Stable sclerotic lesions within the spine and pelvis compatible with treated metastasis. 2. No evidence of disease progression or local tumor recurrence within the chest abdomen or pelvis. 3. Stable small pulmonary nodules.   Electronically Signed   By: TKerby MoorsM.D.   On:  06/09/2015 13:36    Impression:  Patient doing relatively well with stable metastatic disease with no evidence of disease progression. Short-term memory loss could be multifactorial, but does not appear to be related to brain metastases.  Plan:  Schedule brain MRI in 3 months and then follow up. Continue to closely work with palliative medicine to help with symptom management and continuously modifying goals of care.  This document serves as a record of services personally performed by Tyler Pita, MD. It was created on his behalf by Darcus Austin, a trained medical scribe. The creation of this record is based on the scribe's personal observations and the provider's statements to them. This document has been checked  and approved by the attending provider.     _____________________________________  Sheral Apley. Tammi Klippel, M.D.

## 2015-06-16 NOTE — Progress Notes (Signed)
Counseling Intern Session Note:  Intake session with client.  Client and counselor discussed presenting concerns (anxiety, loss of appetite, increasing weakness, loss of meaning/purpose). Client noted previous use of anti-anxiety medications but reported adverse side effects and expressed desire to develop non-medicinal anxiety coping strategies. Client and counselor collaborated on ways to shift previous coping strategies (exercise, nature, music) to her present needs as well as relaxations techniques for anxiety. Client and counselor discussed ways to increase community involvement congruent with her current energy level.  Counselor assisted in establishing a connection with the staff nutritionist to address issues related to appetite and weakness. Follow up session scheduled for 06/23/15.

## 2015-06-22 ENCOUNTER — Encounter: Payer: Self-pay | Admitting: Internal Medicine

## 2015-06-22 ENCOUNTER — Other Ambulatory Visit (INDEPENDENT_AMBULATORY_CARE_PROVIDER_SITE_OTHER): Payer: Medicare Other

## 2015-06-22 ENCOUNTER — Ambulatory Visit (INDEPENDENT_AMBULATORY_CARE_PROVIDER_SITE_OTHER): Payer: Medicare Other | Admitting: Internal Medicine

## 2015-06-22 VITALS — BP 114/76 | HR 67 | Temp 97.7°F | Ht 65.0 in | Wt 121.0 lb

## 2015-06-22 DIAGNOSIS — R030 Elevated blood-pressure reading, without diagnosis of hypertension: Secondary | ICD-10-CM

## 2015-06-22 DIAGNOSIS — K5909 Other constipation: Secondary | ICD-10-CM

## 2015-06-22 DIAGNOSIS — Z23 Encounter for immunization: Secondary | ICD-10-CM | POA: Diagnosis not present

## 2015-06-22 DIAGNOSIS — F411 Generalized anxiety disorder: Secondary | ICD-10-CM

## 2015-06-22 DIAGNOSIS — R1114 Bilious vomiting: Secondary | ICD-10-CM | POA: Diagnosis not present

## 2015-06-22 DIAGNOSIS — R1030 Lower abdominal pain, unspecified: Secondary | ICD-10-CM

## 2015-06-22 DIAGNOSIS — E860 Dehydration: Secondary | ICD-10-CM

## 2015-06-22 DIAGNOSIS — R634 Abnormal weight loss: Secondary | ICD-10-CM

## 2015-06-22 LAB — URINALYSIS, ROUTINE W REFLEX MICROSCOPIC
BILIRUBIN URINE: NEGATIVE
HGB URINE DIPSTICK: NEGATIVE
KETONES UR: NEGATIVE
NITRITE: NEGATIVE
Specific Gravity, Urine: 1.01 (ref 1.000–1.030)
URINE GLUCOSE: NEGATIVE
UROBILINOGEN UA: 0.2 (ref 0.0–1.0)
pH: 6.5 (ref 5.0–8.0)

## 2015-06-22 MED ORDER — CITALOPRAM HYDROBROMIDE 10 MG PO TABS
10.0000 mg | ORAL_TABLET | Freq: Every day | ORAL | Status: DC
Start: 2015-06-22 — End: 2015-08-19

## 2015-06-22 MED ORDER — PANTOPRAZOLE SODIUM 40 MG PO TBEC: 40.0000 mg | DELAYED_RELEASE_TABLET | Freq: Every day | ORAL | Status: AC

## 2015-06-22 MED ORDER — ONDANSETRON 8 MG PO TBDP: 8.0000 mg | ORAL_TABLET | Freq: Three times a day (TID) | ORAL | Status: DC | PRN

## 2015-06-22 NOTE — Assessment & Plan Note (Signed)
Most c/w constipation I believe, but check UA as well, o/w tx as below as well  Note:  Total time for pt hx, exam, review of record with pt in the room, determination of diagnoses and plan for further eval and tx is > 40 min, with over 50% spent in coordination and counseling of patient\

## 2015-06-22 NOTE — Assessment & Plan Note (Signed)
Ok to add senkot 1 po qhs,  to f/u any worsening symptoms or concerns

## 2015-06-22 NOTE — Assessment & Plan Note (Signed)
Encouraged cont'd Ensure use

## 2015-06-22 NOTE — Assessment & Plan Note (Signed)
Needs to avoid benzo use- for celexa 10 qd, declines counseling referral

## 2015-06-22 NOTE — Assessment & Plan Note (Signed)
?   Etiology, for protonix 40 qd trial, zofran prn, f/u egd 1 wk as planned

## 2015-06-22 NOTE — Progress Notes (Signed)
Subjective:    Patient ID: Tamara Ortiz, female    DOB: 12/26/1940, 74 y.o.   MRN: [id number]  HPI  Here to f/u; Pt denies chest pain, increased sob or doe, wheezing, orthopnea, PND, increased LE swelling, palpitations, dizziness or syncope.  Has been losing wt, has periumbilical discomfort assoc with freq nausea and occas vomiting bilious per pt, can vomit often once daily, sometimes more. Has had IVF's at the cancer center related to lower po intake. Has lost wt -  Wt Readings from Last 3 Encounters:  06/22/15 121 lb (54.885 kg)  06/14/15 124 lb 1.6 oz (56.291 kg)  06/14/15 122 lb 12.8 oz (55.702 kg)  overall approx 25 lbs x 10 months.   BP Readings from Last 3 Encounters:  06/22/15 114/76  06/14/15 147/82  06/14/15 139/95  Denies worsening reflux, dysphagia, or blood.  Has frequent constipation recently despite miralax.  Has EGD per Dr Henrene Pastor sched for 1 wk from today.  Denies worsening depressive symptoms, suicidal ideation, or panic; has ongoing anxiety, more recently but lorazepam seemed to cause confusion, so stopped taking.  Past Medical History  Diagnosis Date  . Hypertension   . HYPERLIPIDEMIA 11/05/2007  . ANXIETY 05/13/2009  . DEPRESSIVE DISORDER 05/06/2008  . HYPERTENSION 11/05/2007  . ALLERGIC RHINITIS 11/05/2007  . CONSTIPATION, CHRONIC 05/13/2009  . UTI 05/06/2008  . BACK PAIN 05/11/2008  . OSTEOPOROSIS 11/05/2007  . INSOMNIA-SLEEP DISORDER-UNSPEC 05/13/2009  . FATIGUE 05/13/2009  . Swelling, mass, or lump in chest 11/05/2007  . LUNG CANCER, HX OF 05/06/2008  . 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 Lung cancer   . Brain cancer    Past Surgical History  Procedure Laterality Date  . Tubal ligation    . S/p lul lung surgury  12/2007  . Lobectomy      reports that she has never smoked. She has never used smokeless tobacco. She reports that she drinks alcohol. She reports that she does not use illicit drugs. family history includes ALS in her father; Alcohol abuse  in her son; Heart disease in her mother. There is no history of Cancer. Allergies  Allergen Reactions  . Amoxicillin Hives and Itching  . Levaquin [Levofloxacin] Nausea And Vomiting   Current Outpatient Prescriptions on File Prior to Visit  Medication Sig Dispense Refill  . Alum & Mag Hydroxide-Simeth (MAGIC MOUTHWASH) SOLN Take 5 mLs by mouth 4 (four) times daily as needed for mouth pain. Swish and spit (Patient not taking: Reported on 05/17/2015) 240 mL 0  . aspirin 81 MG EC tablet Take 81 mg by mouth daily.      . Calcium Carbonate-Vitamin D (CALCIUM-VITAMIN D) 500-200 MG-UNIT per tablet Take 1 tablet by mouth 2 (two) times daily.      . folic acid (FOLVITE) 1 MG tablet TAKE 1 TABLET BY MOUTH DAILY 90 tablet 0  . HYDROcodone-acetaminophen (NORCO) 5-325 MG per tablet Take 1 tablet by mouth every 6 (six) hours as needed for moderate pain. 30 tablet 0  . LORazepam (ATIVAN) 0.5 MG tablet Take 1 tablet (0.5 mg total) by mouth 2 (two) times daily as needed for anxiety. (Patient not taking: Reported on 06/14/2015) 60 tablet 2  . Multiple Vitamin (ANTIOXIDANT A/C/E PO) Take by mouth daily.      . ondansetron (ZOFRAN ODT) 8 MG disintegrating tablet Take 1 tablet (8 mg total) by mouth every 8 (eight) hours as needed for nausea or vomiting. (Patient not taking:  Reported on 06/14/2015) 30 tablet 1  . pentoxifylline (TRENTAL) 400 MG CR tablet Take 400 mg by mouth 2 (two) times daily.  2  . polyethylene glycol powder (MIRALAX) powder Take 17 g by mouth daily.      [name] zolendronic acid (ZOMETA) 4 MG/5ML injection Inject 4 mg into the vein once.      [name] zolpidem (AMBIEN CR) 6.25 MG CR tablet Take 1 tablet (6.25 mg total) by mouth at bedtime as needed for sleep. 90 tablet 1   No current facility-administered medications on file prior to visit.   Review of Systems  Constitutional: Negative for unusual diaphoresis or night sweats HENT: Negative for ringing in ear or discharge Eyes: Negative for double vision or  worsening visual disturbance.  Respiratory: Negative for choking and stridor.   Gastrointestinal: Negative for vomiting or other signifcant bowel change Genitourinary: Negative for hematuria or change in urine volume.  Musculoskeletal: Negative for other MSK pain or swelling Skin: Negative for color change and worsening wound.  Neurological: Negative for tremors and numbness other than noted  Psychiatric/Behavioral: Negative for decreased concentration or agitation other than above       Objective:   Physical Exam BP 114/76 mmHg  Pulse 67  Temp(Src) 97.7 F (36.5 C) (Oral)  Ht '5\' 5"'$  (1.651 m)  Wt 121 lb (54.885 kg)  BMI 20.14 kg/m2  SpO2 98% VS noted,  Constitutional: Pt appears in no significant distress HENT: Head: NCAT.  Right Ear: External ear normal.  Left Ear: External ear normal.  Eyes: . Pupils are equal, round, and reactive to light. Conjunctivae and EOM are normal Neck: Normal range of motion. Neck supple.  Cardiovascular: Normal rate and regular rhythm.   Pulmonary/Chest: Effort normal and breath sounds without rales or wheezing.  Abd:  Soft, NT, ND, + BS - benign exam Neurological: Pt is alert. Not confused , motor grossly intact Skin: Skin is warm. No rash, no LE edema Psychiatric: Pt behavior is normal. No agitation.     Assessment & Plan:

## 2015-06-22 NOTE — Patient Instructions (Signed)
Please take all new medication as prescribed  - the protonix 40 mg per day for acid, also celexa 10 mg per day for nerves  Please continue all other medications as before, and refills have been done if requested - the zofran  You can also start Senakot OTC - 1 by mouth at bedtime to assist with constipatoin  Please have the pharmacy call with any other refills you may need.  Please keep your appointments with your specialists as you may have planned - Dr Henrene Pastor and the EGD in 1 week  Cont Ensure as you are doing  Please go to the LAB in the Basement (turn left off the elevator) for the tests to be done today - just the urine testing today  You will be contacted by phone if any changes need to be made immediately.  Otherwise, you will receive a letter about your results with an explanation, but please check with MyChart first.  Please remember to sign up for MyChart if you have not done so, as this will be important to you in the future with finding out test results, communicating by private email, and scheduling acute appointments online when needed.  Please return in 8month, or sooner if needed

## 2015-06-22 NOTE — Assessment & Plan Note (Signed)
Mild elev noted with chemo, improved today, ok to cont to monitor

## 2015-06-22 NOTE — Progress Notes (Signed)
Pre visit review using our clinic review tool, if applicable. No additional management support is needed unless otherwise documented below in the visit note. 

## 2015-06-23 NOTE — Progress Notes (Signed)
Counseling intern note: 2nd session with patient.  Patient and counselor discussed relaxation and anxiety management techniques, including guided meditation and progressive muscle relaxation.  Client and counselor discussed sources of anxiety including health, future plans, and loss of ability.  Patient and counselor discussed coping strategies and sources of meaning in the clients life.  Client reported decreased anxiety from the previous week.  Follow up session scheduled for 06/30/15

## 2015-06-30 NOTE — Progress Notes (Signed)
Counseling Intern note: 3rd session with patient.  Patient reports a decrease in vomiting over the past week but is till experiencing anxiety around the source of her vomiting and her approaching appointment to address it.  Patient and counselor discussed sources of anxiety, use of existing coping skills, and collaborated on new coping skills for the approaching winter (indoor).  Patient reports a desire to reduce her anxiety and improve her sleep without the use of medication. Next session scheduled for 07/14/15

## 2015-07-02 ENCOUNTER — Other Ambulatory Visit: Payer: Medicare Other

## 2015-07-05 ENCOUNTER — Ambulatory Visit: Payer: Self-pay | Admitting: Radiation Oncology

## 2015-07-06 ENCOUNTER — Ambulatory Visit (AMBULATORY_SURGERY_CENTER): Payer: Medicare Other | Admitting: Internal Medicine

## 2015-07-06 ENCOUNTER — Encounter: Payer: Self-pay | Admitting: Internal Medicine

## 2015-07-06 VITALS — BP 132/73 | HR 54 | Temp 97.6°F | Resp 17 | Ht 65.0 in | Wt 121.0 lb

## 2015-07-06 DIAGNOSIS — R634 Abnormal weight loss: Secondary | ICD-10-CM

## 2015-07-06 DIAGNOSIS — R1030 Lower abdominal pain, unspecified: Secondary | ICD-10-CM | POA: Diagnosis not present

## 2015-07-06 DIAGNOSIS — R630 Anorexia: Secondary | ICD-10-CM | POA: Diagnosis not present

## 2015-07-06 DIAGNOSIS — R112 Nausea with vomiting, unspecified: Secondary | ICD-10-CM | POA: Diagnosis not present

## 2015-07-06 MED ORDER — SODIUM CHLORIDE 0.9 % IV SOLN: 500.0000 mL | INTRAVENOUS | Status: DC

## 2015-07-06 NOTE — Op Note (Signed)
[place]  [address] [place], [id number]   ENDOSCOPY PROCEDURE REPORT  PATIENT: Tamara Ortiz, Tamara Ortiz  MR#: [id number] BIRTHDATE: 1941/04/11 , 73  yrs. old GENDER: female ENDOSCOPIST: Eustace Quail, MD REFERRED BY:  Curt Bears, M.D. PROCEDURE DATE:  07/06/2015 PROCEDURE:  EGD, diagnostic ASA CLASS:     Class III INDICATIONS:  vomiting, nausea, and weight loss.. Has stable about once weekly episodes of vomiting without meals. Known metastatic lung cancer to brain. MEDICATIONS: Monitored anesthesia care and Propofol 80 mg IV TOPICAL ANESTHETIC: none  DESCRIPTION OF PROCEDURE: After the risks benefits and alternatives of the procedure were thoroughly explained, informed consent was obtained.  The LB [id number] endoscope was introduced through the mouth and advanced to the second portion of the duodenum , Without limitations.  The instrument was slowly withdrawn as the mucosa was fully examined.   EXAM: The esophagus and gastroesophageal junction were completely normal in appearance.  The stomach was entered and closely examined.The antrum, angularis, and lesser curvature were well visualized, including a retroflexed view of the cardia and fundus. The stomach wall was normally distensable.  The scope passed easily through the pylorus into the duodenum.  Retroflexed views revealed no abnormalities.     The scope was then withdrawn from the patient and the procedure completed.  COMPLICATIONS: There were no immediate complications.  ENDOSCOPIC IMPRESSION: 1. Normal EGD 2. Suspect episodic vomiting secondary to CNS disease  RECOMMENDATIONS: 1. Continue current medications 2. Return to the care of your oncologist  REPEAT EXAM:  eSigned:  Eustace Quail, MD 07/06/2015 10:14 AM    CC:The Patient, Curt Bears, MD, and Biagio Borg, MD

## 2015-07-06 NOTE — Patient Instructions (Signed)
YOU HAD AN ENDOSCOPIC PROCEDURE TODAY AT [facility]:   Refer to the procedure report that was given to you for any specific questions about what was found during the examination.  If the procedure report does not answer your questions, please call your gastroenterologist to clarify.  If you requested that your care partner not be given the details of your procedure findings, then the procedure report has been included in a sealed envelope for you to review at your convenience later.  YOU SHOULD EXPECT: Some feelings of bloating in the abdomen. Passage of more gas than usual.  Walking can help get rid of the air that was put into your GI tract during the procedure and reduce the bloating. If you had a lower endoscopy (such as a colonoscopy or flexible sigmoidoscopy) you may notice spotting of blood in your stool or on the toilet paper. If you underwent a bowel prep for your procedure, you may not have a normal bowel movement for a few days.  Please Note:  You might notice some irritation and congestion in your nose or some drainage.  This is from the oxygen used during your procedure.  There is no need for concern and it should clear up in a day or so.  SYMPTOMS TO REPORT IMMEDIATELY:   Following upper endoscopy (EGD)  Vomiting of blood or coffee ground material  New chest pain or pain under the shoulder blades  Painful or persistently difficult swallowing  New shortness of breath  Fever of 100F or higher  Black, tarry-looking stools  For urgent or emergent issues, a gastroenterologist can be reached at any hour by calling [phone].   DIET: Your first meal following the procedure should be a small meal and then it is ok to progress to your normal diet. Heavy or fried foods are harder to digest and may make you feel nauseous or bloated.  Likewise, meals heavy in dairy and vegetables can increase bloating.  Drink plenty of fluids but you should avoid alcoholic beverages for  24 hours.  Normal endoscopy  ACTIVITY:  You should plan to take it easy for the rest of today and you should NOT DRIVE or use heavy machinery until tomorrow (because of the sedation medicines used during the test).    FOLLOW UP: Our staff will call the number listed on your records the next business day following your procedure to check on you and address any questions or concerns that you may have regarding the information given to you following your procedure. If we do not reach you, we will leave a message.  However, if you are feeling well and you are not experiencing any problems, there is no need to return our call.  We will assume that you have returned to your regular daily activities without incident.  If any biopsies were taken you will be contacted by phone or by letter within the next 1-3 weeks.  Please call us at [phone] if you have not heard about the biopsies in 3 weeks.    SIGNATURES/CONFIDENTIALITY: You and/or your care partner have signed paperwork which will be entered into your electronic medical record.  These signatures attest to the fact that that the information above on your After Visit Summary has been reviewed and is understood.  Full responsibility of the confidentiality of this discharge information lies with you and/or your care-partner.

## 2015-07-06 NOTE — Progress Notes (Signed)
Transferred to recovery room. A/O x3, pleased with MAC.  VSS.  Report to Jane, RN. 

## 2015-07-07 ENCOUNTER — Telehealth: Payer: Self-pay | Admitting: Internal Medicine

## 2015-07-07 ENCOUNTER — Telehealth: Payer: Self-pay | Admitting: *Deleted

## 2015-07-07 MED ORDER — ZOLPIDEM TARTRATE ER 6.25 MG PO TBCR: 6.2500 mg | EXTENDED_RELEASE_TABLET | Freq: Every evening | ORAL | Status: DC | PRN

## 2015-07-07 NOTE — Telephone Encounter (Signed)
Notified pt inform md ok rx pt is wanting to pick rx up left at front desk...Tamara Ortiz

## 2015-07-07 NOTE — Telephone Encounter (Signed)
error 

## 2015-07-07 NOTE — Telephone Encounter (Signed)
Printed and signed.  

## 2015-07-07 NOTE — Telephone Encounter (Signed)
Left message on f/u call 

## 2015-07-07 NOTE — Telephone Encounter (Signed)
Receive call pt is requesting refill opn her zolpidem. MD out of office pls advise on refill...Tamara Ortiz

## 2015-07-08 DIAGNOSIS — R530 Neoplastic (malignant) related fatigue: Secondary | ICD-10-CM | POA: Insufficient documentation

## 2015-07-08 DIAGNOSIS — F5000 Anorexia nervosa, unspecified: Secondary | ICD-10-CM | POA: Insufficient documentation

## 2015-07-12 ENCOUNTER — Ambulatory Visit (HOSPITAL_BASED_OUTPATIENT_CLINIC_OR_DEPARTMENT_OTHER): Payer: Medicare Other

## 2015-07-12 ENCOUNTER — Ambulatory Visit (HOSPITAL_BASED_OUTPATIENT_CLINIC_OR_DEPARTMENT_OTHER): Payer: Medicare Other | Admitting: Oncology

## 2015-07-12 ENCOUNTER — Ambulatory Visit: Payer: Medicare Other

## 2015-07-12 ENCOUNTER — Other Ambulatory Visit (HOSPITAL_BASED_OUTPATIENT_CLINIC_OR_DEPARTMENT_OTHER): Payer: Medicare Other

## 2015-07-12 ENCOUNTER — Encounter: Payer: Self-pay | Admitting: Oncology

## 2015-07-12 ENCOUNTER — Other Ambulatory Visit: Payer: Self-pay | Admitting: Oncology

## 2015-07-12 VITALS — BP 139/71 | HR 58 | Temp 98.1°F | Resp 18 | Ht 65.0 in | Wt 124.5 lb

## 2015-07-12 DIAGNOSIS — C7951 Secondary malignant neoplasm of bone: Secondary | ICD-10-CM

## 2015-07-12 DIAGNOSIS — R413 Other amnesia: Secondary | ICD-10-CM

## 2015-07-12 DIAGNOSIS — C7931 Secondary malignant neoplasm of brain: Secondary | ICD-10-CM | POA: Diagnosis not present

## 2015-07-12 DIAGNOSIS — C3412 Malignant neoplasm of upper lobe, left bronchus or lung: Secondary | ICD-10-CM

## 2015-07-12 DIAGNOSIS — C3492 Malignant neoplasm of unspecified part of left bronchus or lung: Secondary | ICD-10-CM

## 2015-07-12 DIAGNOSIS — Z5111 Encounter for antineoplastic chemotherapy: Secondary | ICD-10-CM | POA: Diagnosis present

## 2015-07-12 DIAGNOSIS — R110 Nausea: Secondary | ICD-10-CM

## 2015-07-12 DIAGNOSIS — M545 Low back pain: Secondary | ICD-10-CM | POA: Diagnosis not present

## 2015-07-12 DIAGNOSIS — F411 Generalized anxiety disorder: Secondary | ICD-10-CM

## 2015-07-12 DIAGNOSIS — C801 Malignant (primary) neoplasm, unspecified: Secondary | ICD-10-CM

## 2015-07-12 LAB — CBC WITH DIFFERENTIAL/PLATELET
BASO%: 1.3 % (ref 0.0–2.0)
BASOS ABS: 0.1 10*3/uL (ref 0.0–0.1)
EOS ABS: 0 10*3/uL (ref 0.0–0.5)
EOS%: 0.7 % (ref 0.0–7.0)
HEMATOCRIT: 38.3 % (ref 34.8–46.6)
HGB: 12.8 g/dL (ref 11.6–15.9)
LYMPH%: 21.6 % (ref 14.0–49.7)
MCH: 33.3 pg (ref 25.1–34.0)
MCHC: 33.6 g/dL (ref 31.5–36.0)
MCV: 99.2 fL (ref 79.5–101.0)
MONO#: 0.5 10*3/uL (ref 0.1–0.9)
MONO%: 8.7 % (ref 0.0–14.0)
NEUT#: 3.6 10*3/uL (ref 1.5–6.5)
NEUT%: 67.7 % (ref 38.4–76.8)
Platelets: 236 10*3/uL (ref 145–400)
RBC: 3.86 10*6/uL (ref 3.70–5.45)
RDW: 13.8 % (ref 11.2–14.5)
WBC: 5.3 10*3/uL (ref 3.9–10.3)
lymph#: 1.1 10*3/uL (ref 0.9–3.3)

## 2015-07-12 LAB — COMPREHENSIVE METABOLIC PANEL (CC13)
ALBUMIN: 3.8 g/dL (ref 3.5–5.0)
ALK PHOS: 73 U/L (ref 40–150)
ALT: 13 U/L (ref 0–55)
AST: 20 U/L (ref 5–34)
Anion Gap: 8 mEq/L (ref 3–11)
BUN: 18.6 mg/dL (ref 7.0–26.0)
CALCIUM: 9.9 mg/dL (ref 8.4–10.4)
CHLORIDE: 104 meq/L (ref 98–109)
CO2: 29 mEq/L (ref 22–29)
Creatinine: 1.1 mg/dL (ref 0.6–1.1)
EGFR: 49 mL/min/{1.73_m2} — AB (ref >90)
Glucose: 90 mg/dl (ref 70–140)
POTASSIUM: 3.7 meq/L (ref 3.5–5.1)
Sodium: 141 mEq/L (ref 136–145)
Total Bilirubin: 0.49 mg/dL (ref 0.20–1.20)
Total Protein: 7 g/dL (ref 6.4–8.3)

## 2015-07-12 MED ORDER — SODIUM CHLORIDE 0.9 % IV SOLN
Freq: Once | INTRAVENOUS | Status: AC
  Administered 2015-07-12: 12:00:00 via INTRAVENOUS
  Filled 2015-07-12: qty 4

## 2015-07-12 MED ORDER — OLANZAPINE 5 MG PO TABS: 5.0000 mg | ORAL_TABLET | Freq: Every day | ORAL | Status: DC

## 2015-07-12 MED ORDER — SODIUM CHLORIDE 0.9 % IV SOLN
Freq: Once | INTRAVENOUS | Status: AC
  Administered 2015-07-12: 12:00:00 via INTRAVENOUS

## 2015-07-12 MED ORDER — SODIUM CHLORIDE 0.9 % IV SOLN
400.0000 mg/m2 | Freq: Once | INTRAVENOUS | Status: AC
  Administered 2015-07-12: 700 mg via INTRAVENOUS
  Filled 2015-07-12: qty 24

## 2015-07-12 NOTE — Patient Instructions (Signed)
[facility] Discharge Instructions for Patients Receiving Chemotherapy  Today you received the following chemotherapy agents alimta  To help prevent nausea and vomiting after your treatment, we encourage you to take your nausea medication   If you develop nausea and vomiting that is not controlled by your nausea medication, call the clinic.   BELOW ARE SYMPTOMS THAT SHOULD BE REPORTED IMMEDIATELY:  *FEVER GREATER THAN 100.5 F  *CHILLS WITH OR WITHOUT FEVER  NAUSEA AND VOMITING THAT IS NOT CONTROLLED WITH YOUR NAUSEA MEDICATION  *UNUSUAL SHORTNESS OF BREATH  *UNUSUAL BRUISING OR BLEEDING  TENDERNESS IN MOUTH AND THROAT WITH OR WITHOUT PRESENCE OF ULCERS  *URINARY PROBLEMS  *BOWEL PROBLEMS  UNUSUAL RASH Items with * indicate a potential emergency and should be followed up as soon as possible.  Feel free to call the clinic you have any questions or concerns. The clinic phone number is [phone].  Please show the [facility] at check-in to the Emergency Department and triage nurse.

## 2015-07-12 NOTE — Progress Notes (Signed)
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   PRIOR THERAPY:  1. Status post left upper lobectomy with mediastinal lymph node dissection under the care of Dr. Roxan Hockey on December 10, 2007.  2. Status post 4 cycles of adjuvant chemotherapy with cisplatin and Taxotere. Last dose was given 03/22/2008.  3. Status post 6 cycles of systemic chemotherapy with carboplatin and Alimta for metastatic disease in the bone. The last dose was given July 02, 2010, with stable disease.  4. Status post 12 cycles of maintenance Alimta at 500 mg/sq m given every 3 weeks. The last dose was given 05/08/2011. 5. Stereotactic radiotherapy to metastatic brain lesions under the care of Dr. Tammi Klippel completed on 07/30/2014.  CURRENT THERAPY:  1. Maintenance Alimta at 500 mg/sq m given every 4 weeks. The patient is status post 50 cycles.  2. Zometa 4 mg IV given every 2 months for bone metastasis.   CHEMOTHERAPY INTENT: Palliative  CURRENT # OF CHEMOTHERAPY CYCLES: 51 CURRENT ANTIEMETICS: none  CURRENT SMOKING STATUS: Never smoker   ORAL CHEMOTHERAPY AND CONSENT: n/a  CURRENT BISPHOSPHONATES USE: Yes, Zometa given every 2 months  PAIN MANAGEMENT: [employer]  NARCOTICS INDUCED CONSTIPATION: Occasional, uses MiraLax  LIVING WILL AND CODE STATUS: Has a living will   INTERVAL HISTORY: Tamara Ortiz 74 y.o. female returns to the clinic today for followup visit. The patient is feeling fine today with no specific complaints except for mild fatigue and some memory issues. She  She also has mild nausea and is currently on Zofran for nausea.  She discontinued Ativan due to fatigue. She had an  upper endoscopy by Dr. Gwenlyn Found last week which was normal. It was suggested that her nausea may be due to her brain mets. Recent MRI of brain in 06/2015 was stable. She denied having any significant weight loss or night sweats. She has no chest pain, shortness of breath, cough or hemoptysis. She has no fever or chills. She is tolerating her treatment with Alimta fairly well with no significant adverse effects.     MEDICAL HISTORY: Past Medical History  Diagnosis Date  . Hypertension   . HYPERLIPIDEMIA 11/05/2007  . ANXIETY 05/13/2009  . DEPRESSIVE DISORDER 05/06/2008  . HYPERTENSION 11/05/2007  . ALLERGIC RHINITIS 11/05/2007  . CONSTIPATION, CHRONIC 05/13/2009  . UTI 05/06/2008  . BACK PAIN 05/11/2008  . OSTEOPOROSIS 11/05/2007  . INSOMNIA-SLEEP DISORDER-UNSPEC 05/13/2009  . FATIGUE 05/13/2009  . Swelling, mass, or lump in chest 11/05/2007  . LUNG CANCER, HX OF 05/06/2008  . 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 Lung cancer ([facility])   . Brain cancer ([facility])     ALLERGIES:  is allergic to amoxicillin; levaquin; and lorazepam.  MEDICATIONS:  Current Outpatient Prescriptions  Medication Sig Dispense Refill  . Alum & Mag Hydroxide-Simeth (MAGIC MOUTHWASH) SOLN Take 5 mLs by mouth 4 (four) times daily as needed for mouth pain. Swish and spit 240 mL 0  . aspirin 81 MG EC tablet Take 81 mg by mouth daily.      . Calcium Carbonate-Vitamin D (CALCIUM-VITAMIN D) 500-200 MG-UNIT per tablet Take 1 tablet by mouth 2 (two)  times daily.      . citalopram (CELEXA) 10 MG tablet Take 1 tablet (10 mg total) by mouth daily. 30 tablet 11  . folic acid (FOLVITE) 1 MG tablet TAKE 1 TABLET BY MOUTH DAILY 90 tablet 0  . HYDROcodone-acetaminophen (NORCO) 5-325 MG per tablet Take 1 tablet by mouth every 6 (six) hours as needed for moderate pain. 30 tablet 0  . Multiple Vitamin (ANTIOXIDANT A/C/E PO) Take by mouth daily.      . pantoprazole (PROTONIX) 40 MG tablet Take 1 tablet (40 mg total) by mouth daily. 90 tablet  3  . pentoxifylline (TRENTAL) 400 MG CR tablet Take 400 mg by mouth 2 (two) times daily.  2  . polyethylene glycol powder (MIRALAX) powder Take 17 g by mouth daily.      [name] zolendronic acid (ZOMETA) 4 MG/5ML injection Inject 4 mg into the vein once.      [name] zolpidem (AMBIEN CR) 6.25 MG CR tablet Take 1 tablet (6.25 mg total) by mouth at bedtime as needed for sleep. 30 tablet 0  . OLANZapine (ZYPREXA) 5 MG tablet Take 1 tablet (5 mg total) by mouth at bedtime. 30 tablet 0  . ondansetron (ZOFRAN ODT) 8 MG disintegrating tablet Take 1 tablet (8 mg total) by mouth every 8 (eight) hours as needed for nausea or vomiting. 30 tablet 1   No current facility-administered medications for this visit.    SURGICAL HISTORY:  Past Surgical History  Procedure Laterality Date  . Tubal ligation    . S/p lul lung surgury  12/2007  . Lobectomy      REVIEW OF SYSTEMS:  Constitutional: positive for fatigue Eyes: negative Ears, nose, mouth, throat, and face: negative Respiratory: negative Cardiovascular: negative Gastrointestinal: positive for constipation, nausea and vomiting Genitourinary:negative Integument/breast: negative Hematologic/lymphatic: negative Musculoskeletal:negative Neurological: negative Behavioral/Psych: negative Endocrine: negative Allergic/Immunologic: negative   PHYSICAL EXAMINATION: General appearance: alert, cooperative and no distress Head: Normocephalic, without obvious abnormality, atraumatic Neck: no adenopathy Lymph nodes: Cervical, supraclavicular, and axillary nodes normal. Resp: clear to auscultation bilaterally Back: symmetric, no curvature. ROM normal. No CVA tenderness. Cardio: regular rate and rhythm, S1, S2 normal, no murmur, click, rub or gallop GI: soft, non-tender; bowel sounds normal; no masses,  no organomegaly Extremities: extremities normal, atraumatic, no cyanosis or edema Neurologic: Alert and oriented X 3, normal strength and tone. Normal symmetric  reflexes. Normal coordination and gait  ECOG PERFORMANCE STATUS: 1 - Symptomatic but completely ambulatory  Blood pressure 139/71, pulse 58, temperature 98.1 F (36.7 C), temperature source Oral, resp. rate 18, height _0  (1.651 m), weight 124 lb 8 oz (56.473 kg), SpO2 99 %.  LABORATORY DATA: Lab Results  Component Value Date   WBC 5.3 07/12/2015   HGB 12.8 07/12/2015   HCT 38.3 07/12/2015   MCV 99.2 07/12/2015   PLT 236 07/12/2015      Chemistry      Component Value Date/Time   NA 141 07/12/2015 0946   NA 137 08/12/2014 1716   NA 140 04/17/2012 0855   K 3.7 07/12/2015 0946   K 3.7 08/12/2014 1716   K 4.1 04/17/2012 0855   CL 99 08/12/2014 1716   CL 100 03/04/2013 0901   CL 99 04/17/2012 0855   CO2 29 07/12/2015 0946   CO2 35* 08/12/2014 1716   CO2 29 04/17/2012 0855   BUN 18.6 07/12/2015 0946   BUN 16 08/12/2014 1716   BUN 15 04/17/2012 0855   CREATININE 1.1 07/12/2015 0946  CREATININE 1.1 08/12/2014 1716   CREATININE 1.4* 04/17/2012 0855      Component Value Date/Time   CALCIUM 9.9 07/12/2015 0946   CALCIUM 10.6* 08/12/2014 1716   CALCIUM 9.5 04/17/2012 0855   ALKPHOS 73 07/12/2015 0946   ALKPHOS 79 08/12/2014 1716   ALKPHOS 68 04/17/2012 0855   AST 20 07/12/2015 0946   AST 28 08/12/2014 1716   AST 29 04/17/2012 0855   ALT 13 07/12/2015 0946   ALT 24 08/12/2014 1716   ALT 31 04/17/2012 0855   BILITOT 0.49 07/12/2015 0946   BILITOT 0.4 08/12/2014 1716   BILITOT 0.60 04/17/2012 0855       RADIOGRAPHIC STUDIES: No results found.  ASSESSMENT AND PLAN: This is a very pleasant 74 year old white female with recurrent non-small cell lung cancer, adenocarcinoma. The patient is currently on maintenance Alimta monthly status post total of 50 cycles.  The patient has been tolerating her treatment fairly well except for few episodes of nausea , fatigue and memory loss. Her recent MRI of the brain as well as CT scan of the chest, abdomen and pelvis showed no  evidence for disease progression.  Patient seen with Dr, Earlie Server. Discussed nausea/vomting. It is not clear that her symptoms are related to the chemo or her brain mets. Recommended for the patient to continue her systemic chemotherapy with single agent Alimta as a scheduled. She will receive cycle #51 today. The patient inquired about switching chemo. Discussed that we could consider an oral agent, but this would also have side effects which could include nausea, diarrhea, and rashes. The patient would like to think about this.  For nausea, will try Zyprexa 5 mg at bedtime. Side effects discussed including sedation. She was instructed not to take her Ambien with the Zyprexa.  For the low back pain, the patient will continue on Vicodin on as-needed basis She would come back for followup visit in one month for reevaluation with the next cycle of her chemotherapy. The patient was advised to call immediately if she has any concerning symptoms in the interval. The patient voices understanding of current disease status and treatment options and is in agreement with the current care plan.  All questions were answered. The patient knows to call the clinic with any problems, questions or concerns. We can certainly see the patient much sooner if necessary.  Mikey Bussing, DNP, AGPCNP-BC, AOCNP  ADDENDUM: Hematology/Oncology Attending: I had a face to face encounter with the patient today. I recommended her care plan. This is a very pleasant 74 years old white female with metastatic non-small cell lung cancer, adenocarcinoma who is currently undergoing maintenance treatment with single agent Alimta every 4 weeks status post 50 cycles and has been tolerating her treatment fairly well. The patient continues to have persistent nausea with and without chemotherapy. She was seen recently by her gastroenterologist and had upper endoscopy that showed no concerning findings. The patient is tolerating her current  systemic chemotherapy fairly well. I recommended for her to proceed with cycle #51 today as a scheduled. For the persistent nausea, I consider the patient for treatment with Zyprexa 5 mg by mouth daily at bedtime and tolerating them increase the dose to 10 mg if she has any persistent nausea. She will come back for follow-up visit in 4 weeks for reevaluation with the next cycle of her treatment.  The patient was advised to call immediately if he shas any concerning symptoms in the interval.  Disclaimer: This note was dictated  with voice recognition software. Similar sounding words can inadvertently be transcribed and may be missed upon review. Eilleen Kempf., MD 07/12/2015

## 2015-07-13 ENCOUNTER — Telehealth: Payer: Self-pay | Admitting: *Deleted

## 2015-07-13 NOTE — Telephone Encounter (Signed)
Returned call to pt, unable to reach lmovm for pt to call back with her medication concerns.

## 2015-07-13 NOTE — Telephone Encounter (Addendum)
Tamara Ortiz returning [facility] call regarding medication questions. Tamara Ortiz was inquiring as to whether or not she should take the Zyprexa, and the Ambien at the same time at night.  She wakes up ~4am and her legs tend to be achy and she takes a hydrocodone 5/325 mg. Suggested that she Tale the Zyprexa an hour prior to bedtime.  Bedtime is ~11pm for Tamara Ortiz. If she wakes up with aches then she can take a hydrocodone.  She can see how she feels in the morning with new medication.  Some times people feel "hung over" or fuzzy headed. If she slept well then she does not need the Ambien.  She can add the Ambien at hs  But be carefull of being very sedated and getting up in during the night. She needs to Dr. Worthy Flank office if she is not tolerating  Either of these new prescriptions.   Patient verbalized understanding.

## 2015-07-16 ENCOUNTER — Other Ambulatory Visit: Payer: Self-pay | Admitting: Internal Medicine

## 2015-07-16 ENCOUNTER — Telehealth: Payer: Self-pay | Admitting: Medical Oncology

## 2015-07-16 ENCOUNTER — Other Ambulatory Visit: Payer: Self-pay | Admitting: Medical Oncology

## 2015-07-16 DIAGNOSIS — C3492 Malignant neoplasm of unspecified part of left bronchus or lung: Secondary | ICD-10-CM

## 2015-07-16 DIAGNOSIS — C7951 Secondary malignant neoplasm of bone: Secondary | ICD-10-CM

## 2015-07-16 MED ORDER — HYDROCODONE-ACETAMINOPHEN 5-325 MG PO TABS: 1.0000 | ORAL_TABLET | Freq: Four times a day (QID) | ORAL | Status: DC | PRN

## 2015-07-16 NOTE — Telephone Encounter (Signed)
rx ready for pick up and pt notified.

## 2015-07-21 NOTE — Progress Notes (Signed)
Counseling Intern Note:  Pt and counselor discussed health concerns, particularly a decreasing apatite and loss of strength, and psychological distress connected with progressing symptoms and fears about the future.  Discussed strategies for increasing caloric intake and encouraged follow up and communication with PCP and dietician concerning loss of apatite.   Discussed shifting expectations and acceptance strategies as symptoms progress as well as methods for relaxation in the evening (mindfulness breathing exercises). Pt reports many strong coping skills and a strong support network of friends.  Pt reports a desire to develop coping skills for the winter when she is less able to go outside. Follow up apt scheduled for 10/26 at [facility]

## 2015-08-04 ENCOUNTER — Telehealth: Payer: Self-pay | Admitting: Radiation Therapy

## 2015-08-04 NOTE — Progress Notes (Signed)
Counseling Intern Note:  6th session with Pt.  Pt reported feeling well physically, but is concerned about recent experiences where she thinks she might be losing her sense of time and space.  Pt stated that she is sometimes unsure what time/day it is and that this is unusual for her.  She also reported getting lost while driving in a familiar place and needing a friend to guide her home.  Pt states that she is concerned this is a sign that her cancer is progressing and plans to ask her doctor.    Pt and counselor discussed coping strategies, perspective shifting, and the role of control in the Pt's life and how maintaining control can sometimes be counterproductive to achieving health goals (restricting calorie rich foods from her diet, only talking half of certain medications)  Follow up session scheduled for 11/9  [facility] [phone]

## 2015-08-04 NOTE — Telephone Encounter (Signed)
Tamara Ortiz called to say that she has been experiencing difficulty remembering things. Has driven her car and later forgot that she had even gone anywhere. She has also noticed an increase in the frequency of headaches lately as well.   I will reach out to Dr. Tammi Klippel to see if he would like her to come in Monday 11/7 for a follow-up and also a visit with Tamara Ortiz.   Last MRI 06/11/15 IMPRESSION: No worrisome change since the previous study. Three treated foci in the right frontal lobe do not appear progressive. There slightly more central necrosis of the largest right frontal lesion, suggesting this is secondary to treatment effect.  Other chronic areas of leptomeningeal enhancement are not progressive. 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   Last follow-up with Dr. Tammi Klippel 06/14/15  Plan: Schedule brain MRI in 3 months and then follow up. Continue to closely work with palliative medicine to help with symptom management and continuously modifying goals of care.

## 2015-08-09 ENCOUNTER — Encounter: Payer: Self-pay | Admitting: Radiation Oncology

## 2015-08-09 ENCOUNTER — Ambulatory Visit
Admission: RE | Admit: 2015-08-09 | Discharge: 2015-08-09 | Disposition: A | Discharge status: Home / Self Care | Payer: Medicare Other | Source: Ambulatory Visit | Attending: Radiation Oncology | Admitting: Radiation Oncology

## 2015-08-09 ENCOUNTER — Other Ambulatory Visit (HOSPITAL_BASED_OUTPATIENT_CLINIC_OR_DEPARTMENT_OTHER): Payer: Medicare Other

## 2015-08-09 ENCOUNTER — Encounter: Payer: Self-pay | Admitting: Internal Medicine

## 2015-08-09 ENCOUNTER — Ambulatory Visit (HOSPITAL_BASED_OUTPATIENT_CLINIC_OR_DEPARTMENT_OTHER): Payer: Medicare Other | Admitting: Internal Medicine

## 2015-08-09 ENCOUNTER — Telehealth: Payer: Self-pay | Admitting: Radiation Oncology

## 2015-08-09 ENCOUNTER — Telehealth: Payer: Self-pay | Admitting: Internal Medicine

## 2015-08-09 ENCOUNTER — Ambulatory Visit (HOSPITAL_BASED_OUTPATIENT_CLINIC_OR_DEPARTMENT_OTHER): Payer: Medicare Other

## 2015-08-09 VITALS — BP 150/77 | HR 59 | Temp 97.8°F | Resp 18 | Ht 65.0 in | Wt 124.7 lb

## 2015-08-09 VITALS — BP 150/81 | HR 60 | Resp 16 | Wt 124.8 lb

## 2015-08-09 DIAGNOSIS — C7951 Secondary malignant neoplasm of bone: Secondary | ICD-10-CM | POA: Diagnosis not present

## 2015-08-09 DIAGNOSIS — M545 Low back pain: Secondary | ICD-10-CM

## 2015-08-09 DIAGNOSIS — R110 Nausea: Secondary | ICD-10-CM

## 2015-08-09 DIAGNOSIS — Z5111 Encounter for antineoplastic chemotherapy: Secondary | ICD-10-CM | POA: Diagnosis present

## 2015-08-09 DIAGNOSIS — C7931 Secondary malignant neoplasm of brain: Secondary | ICD-10-CM

## 2015-08-09 DIAGNOSIS — C3492 Malignant neoplasm of unspecified part of left bronchus or lung: Secondary | ICD-10-CM

## 2015-08-09 DIAGNOSIS — C3491 Malignant neoplasm of unspecified part of right bronchus or lung: Secondary | ICD-10-CM

## 2015-08-09 DIAGNOSIS — R413 Other amnesia: Secondary | ICD-10-CM | POA: Diagnosis not present

## 2015-08-09 DIAGNOSIS — G629 Polyneuropathy, unspecified: Secondary | ICD-10-CM

## 2015-08-09 DIAGNOSIS — C3412 Malignant neoplasm of upper lobe, left bronchus or lung: Secondary | ICD-10-CM

## 2015-08-09 DIAGNOSIS — R5383 Other fatigue: Secondary | ICD-10-CM

## 2015-08-09 DIAGNOSIS — C801 Malignant (primary) neoplasm, unspecified: Secondary | ICD-10-CM

## 2015-08-09 HISTORY — DX: Polyneuropathy, unspecified: G62.9

## 2015-08-09 LAB — CBC WITH DIFFERENTIAL/PLATELET
BASO%: 0.8 % (ref 0.0–2.0)
Basophils Absolute: 0 10*3/uL (ref 0.0–0.1)
EOS%: 0.6 % (ref 0.0–7.0)
Eosinophils Absolute: 0 10*3/uL (ref 0.0–0.5)
HEMATOCRIT: 34.2 % — AB (ref 34.8–46.6)
HEMOGLOBIN: 11.4 g/dL — AB (ref 11.6–15.9)
LYMPH%: 24 % (ref 14.0–49.7)
MCH: 33.1 pg (ref 25.1–34.0)
MCHC: 33.3 g/dL (ref 31.5–36.0)
MCV: 99.4 fL (ref 79.5–101.0)
MONO#: 0.4 10*3/uL (ref 0.1–0.9)
MONO%: 8.4 % (ref 0.0–14.0)
NEUT%: 66.2 % (ref 38.4–76.8)
NEUTROS ABS: 3.4 10*3/uL (ref 1.5–6.5)
PLATELETS: 262 10*3/uL (ref 145–400)
RBC: 3.44 10*6/uL — ABNORMAL LOW (ref 3.70–5.45)
RDW: 13.5 % (ref 11.2–14.5)
WBC: 5.1 10*3/uL (ref 3.9–10.3)
lymph#: 1.2 10*3/uL (ref 0.9–3.3)

## 2015-08-09 LAB — BASIC METABOLIC PANEL (CC13)
ANION GAP: 10 meq/L (ref 3–11)
BUN: 18.5 mg/dL (ref 7.0–26.0)
CHLORIDE: 106 meq/L (ref 98–109)
CO2: 25 mEq/L (ref 22–29)
CREATININE: 1.1 mg/dL (ref 0.6–1.1)
Calcium: 9.6 mg/dL (ref 8.4–10.4)
EGFR: 51 mL/min/{1.73_m2} — ABNORMAL LOW (ref >90)
Glucose: 85 mg/dl (ref 70–140)
POTASSIUM: 3.8 meq/L (ref 3.5–5.1)
SODIUM: 140 meq/L (ref 136–145)

## 2015-08-09 MED ORDER — SODIUM CHLORIDE 0.9 % IV SOLN
400.0000 mg/m2 | Freq: Once | INTRAVENOUS | Status: AC
  Administered 2015-08-09: 700 mg via INTRAVENOUS
  Filled 2015-08-09: qty 24

## 2015-08-09 MED ORDER — SODIUM CHLORIDE 0.9 % IV SOLN
Freq: Once | INTRAVENOUS | Status: AC
  Administered 2015-08-09: 12:00:00 via INTRAVENOUS
  Filled 2015-08-09: qty 4

## 2015-08-09 MED ORDER — SODIUM CHLORIDE 0.9 % IV SOLN
Freq: Once | INTRAVENOUS | Status: AC
  Administered 2015-08-09: 12:00:00 via INTRAVENOUS

## 2015-08-09 MED ORDER — ZOLEDRONIC ACID 4 MG/100ML IV SOLN
4.0000 mg | Freq: Once | INTRAVENOUS | Status: AC
  Administered 2015-08-09: 4 mg via INTRAVENOUS
  Filled 2015-08-09: qty 100

## 2015-08-09 NOTE — Progress Notes (Signed)
Radiation Oncology         [id number]   Name: Tamara Ortiz   Date: 08/09/2015   MRN: [id number]  DOB: 1941-08-19    Multidisciplinary [facility] Clinic Follow-Up Visit Note  CC: [name], MD  Curt Bears, MD    ICD-9-CM ICD-10-CM   1. 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 Weight and vitals stable. Denies pain. Reports intermittent frontal headaches that last only 30 minutes or so and are relieved with rest. Denies having to take medication to manage headaches. Reports blurry vision is associated with these headaches. Reports her short term memory is worse. States, "I took my best friend home one night and couldn't remember how to get out of her neighborhood." Reports difficulty with word finding as well. Patient questions is alprazolam is causing her memory loss. Last MRI done 9/9.    Patient has stopped taking Alprazolam.   ALLERGIES:  is allergic to amoxicillin; levaquin; and lorazepam.  Meds: Current Outpatient Prescriptions  Medication Sig Dispense Refill  . ALPRAZolam (XANAX) 1 MG tablet Take 1 mg by mouth at bedtime as needed for anxiety.    . Alum & Mag Hydroxide-Simeth (MAGIC MOUTHWASH) SOLN Take 5 mLs by mouth 4 (four) times daily as needed for mouth pain. Swish and spit (Patient not taking: Reported on 08/09/2015) 240 mL 0  . aspirin 81 MG EC tablet Take 81 mg by mouth daily.      . Calcium Carbonate-Vitamin D (CALCIUM-VITAMIN D) 500-200 MG-UNIT per tablet Take 1 tablet  by mouth 2 (two) times daily.      . citalopram (CELEXA) 10 MG tablet Take 1 tablet (10 mg total) by mouth daily. (Patient not taking: Reported on 08/09/2015) 30 tablet 11  . folic acid (FOLVITE) 1 MG tablet TAKE 1 TABLET BY MOUTH DAILY (Patient not taking: Reported on 08/09/2015) 90 tablet 0  . HYDROcodone-acetaminophen (NORCO) 5-325 MG tablet Take 1 tablet by mouth every 6 (six) hours as needed for moderate pain. (Patient not taking: Reported on 08/09/2015) 30 tablet 0  . Multiple Vitamin (ANTIOXIDANT A/C/E PO) Take by mouth daily.      . ondansetron (ZOFRAN ODT) 8 MG disintegrating tablet Take 1 tablet (8 mg total) by mouth every 8 (eight) hours as needed for nausea or vomiting. (Patient not taking: Reported on 08/09/2015) 30 tablet 1  . pantoprazole (PROTONIX) 40 MG tablet Take 1 tablet (40 mg total) by mouth daily. (Patient not taking: Reported on 08/09/2015) 90 tablet 3  . pentoxifylline (TRENTAL) 400 MG CR tablet Take 400 mg by mouth 2 (two) times daily.  2  . polyethylene glycol powder (MIRALAX) powder Take 17 g by mouth daily.      [name] zolendronic acid (ZOMETA) 4 MG/5ML injection Inject 4 mg into the vein once.      [name] zolpidem (AMBIEN CR) 6.25 MG CR tablet Take 1 tablet (6.25 mg total) by mouth at bedtime as needed for sleep. (Patient not taking: Reported on 08/09/2015) 30 tablet 0   No current facility-administered medications for this encounter.  Facility-Administered Medications Ordered in Other Encounters  Medication Dose Route Frequency Provider Last Rate Last Dose  . ondansetron (ZOFRAN) 8 mg, dexamethasone (DECADRON) 10 mg in sodium chloride 0.9 % 50 mL IVPB   Intravenous Once Curt Bears, MD      . PEMEtrexed (ALIMTA) 700 mg in sodium chloride 0.9 % 100 mL chemo infusion  400 mg/m2 (Treatment Plan Actual) Intravenous Once Curt Bears, MD        Physical Findings: The patient is in no acute distress. Patient is alert and oriented.  weight is 124 lb 12.8 oz (56.609 kg). Her  blood pressure is 150/81 and her pulse is 60. Her respiration is 16 and oxygen saturation is 100%.  No significant changes.    Lab Findings: Lab Results  Component Value Date   WBC 5.1 08/09/2015   HGB 11.4* 08/09/2015   HCT 34.2* 08/09/2015   MCV 99.4 08/09/2015   PLT 262 08/09/2015   Radiographic Findings: No results found.  Impression:  Based on the onset of new symptoms (headaches,blurry vision, memory loss), an MRI will be needed to be determine the cause.   Plan: An MRI will be scheduled   _____________________________________  Sheral Apley. Tammi Klippel, M.D.  This document serves as a record of services personally performed by Tyler Pita, MD. It was created on his behalf by Derek Mound, a trained medical scribe. The creation of this record is based on the scribe's personal observations and the provider's statements to them. This document has been checked and approved by the attending provider.

## 2015-08-09 NOTE — Telephone Encounter (Signed)
Phoned patient at home. Woke patient up. Patient has 0800 appointment. Says she can be here in 15 minutes. Instructed patient to come on in.

## 2015-08-09 NOTE — Telephone Encounter (Signed)
Gave and printed appt sched and avs for pt for DEC adn Jan 2017

## 2015-08-09 NOTE — Patient Instructions (Addendum)
[facility] Discharge Instructions for Patients Receiving Chemotherapy  Today you received the following chemotherapy agents Alimta/Zometa  To help prevent nausea and vomiting after your treatment, we encourage you to take your nausea medication     If you develop nausea and vomiting that is not controlled by your nausea medication, call the clinic.   BELOW ARE SYMPTOMS THAT SHOULD BE REPORTED IMMEDIATELY:  *FEVER GREATER THAN 100.5 F  *CHILLS WITH OR WITHOUT FEVER  NAUSEA AND VOMITING THAT IS NOT CONTROLLED WITH YOUR NAUSEA MEDICATION  *UNUSUAL SHORTNESS OF BREATH  *UNUSUAL BRUISING OR BLEEDING  TENDERNESS IN MOUTH AND THROAT WITH OR WITHOUT PRESENCE OF ULCERS  *URINARY PROBLEMS  *BOWEL PROBLEMS  UNUSUAL RASH Items with * indicate a potential emergency and should be followed up as soon as possible.  Feel free to call the clinic you have any questions or concerns. The clinic phone number is [phone].  Please show the [facility] at check-in to the Emergency Department and triage nurse.

## 2015-08-09 NOTE — Addendum Note (Signed)
Encounter addended by: Tyler Pita, MD on: 08/09/2015 12:35 PM<BR>     Documentation filed: Follow-up Section, LOS Section

## 2015-08-09 NOTE — Progress Notes (Signed)
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   PRIOR THERAPY:  1. Status post left upper lobectomy with mediastinal lymph node dissection under the care of Dr. Roxan Hockey on December 10, 2007.  2. Status post 4 cycles of adjuvant chemotherapy with cisplatin and Taxotere. Last dose was given 03/22/2008.  3. Status post 6 cycles of systemic chemotherapy with carboplatin and Alimta for metastatic disease in the bone. The last dose was given July 02, 2010, with stable disease.  4. Status post 12 cycles of maintenance Alimta at 500 mg/sq m given every 3 weeks. The last dose was given 05/08/2011. 5. Stereotactic radiotherapy to metastatic brain lesions under the care of Dr. Tammi Klippel completed on 07/30/2014.  CURRENT THERAPY:  1. Maintenance Alimta at 500 mg/sq m given every 4 weeks. The patient is status post 51 cycles.  2. Zometa 4 mg IV given every 2 months for bone metastasis.   CHEMOTHERAPY INTENT: Palliative  CURRENT # OF CHEMOTHERAPY CYCLES: 52 CURRENT ANTIEMETICS: none  CURRENT SMOKING STATUS: Never smoker   ORAL CHEMOTHERAPY AND CONSENT: n/a  CURRENT BISPHOSPHONATES USE: Yes, Zometa given every 2 months  PAIN MANAGEMENT: Norco  NARCOTICS INDUCED CONSTIPATION: Occasional, uses MiraLax  LIVING WILL AND CODE STATUS: Has a living will   INTERVAL HISTORY: Tamara Ortiz 74 y.o. female returns to the clinic today for followup visit. The patient is feeling fine today with no specific complaints except for fatigue and some memory issues. She has some episodes of confusion twice in the last few weeks. Dr. Tammi Klippel is repeating MRI of the brain for evaluation of her  metastatic brain lesions. She did not have any nausea or vomiting in the last 4 weeks.  She denied having any significant weight loss or night sweats. She has no chest pain, shortness of breath, cough or hemoptysis. She has no fever or chills. She is tolerating her treatment with Alimta fairly well with no significant adverse effects. She is here today to start cycle #52 of her chemotherapy.  MEDICAL HISTORY: Past Medical History  Diagnosis Date  . Hypertension   . HYPERLIPIDEMIA 11/05/2007  . ANXIETY 05/13/2009  . DEPRESSIVE DISORDER 05/06/2008  . HYPERTENSION 11/05/2007  . ALLERGIC RHINITIS 11/05/2007  . CONSTIPATION, CHRONIC 05/13/2009  . UTI 05/06/2008  . BACK PAIN 05/11/2008  . OSTEOPOROSIS 11/05/2007  . INSOMNIA-SLEEP DISORDER-UNSPEC 05/13/2009  . FATIGUE 05/13/2009  . Swelling, mass, or lump in chest 11/05/2007  . LUNG CANCER, HX OF 05/06/2008  . 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 Lung cancer ([place])   . Brain cancer ([place])     ALLERGIES:  is allergic to amoxicillin; levaquin; and lorazepam.  MEDICATIONS:  Current Outpatient Prescriptions  Medication Sig Dispense Refill  . ALPRAZolam (XANAX) 1 MG tablet Take 1 mg by mouth at bedtime as needed for anxiety.    . Alum & Mag Hydroxide-Simeth (MAGIC MOUTHWASH) SOLN Take 5 mLs by mouth 4 (four) times daily as needed for mouth pain. Swish and spit (Patient not taking: Reported on 08/09/2015) 240 mL 0  . aspirin 81 MG EC tablet Take 81 mg by mouth daily.      [name]  Calcium Carbonate-Vitamin D (CALCIUM-VITAMIN D) 500-200 MG-UNIT per tablet Take 1 tablet by mouth 2 (two) times daily.      . citalopram (CELEXA) 10 MG tablet Take 1 tablet (10 mg total) by mouth daily. (Patient not taking: Reported on 08/09/2015) 30 tablet 11  . folic acid (FOLVITE) 1 MG tablet TAKE 1 TABLET BY MOUTH DAILY (Patient not taking: Reported on 08/09/2015) 90 tablet 0  . HYDROcodone-acetaminophen (NORCO) 5-325 MG tablet Take 1 tablet by mouth every 6 (six) hours as  needed for moderate pain. (Patient not taking: Reported on 08/09/2015) 30 tablet 0  . Multiple Vitamin (ANTIOXIDANT A/C/E PO) Take by mouth daily.      . ondansetron (ZOFRAN ODT) 8 MG disintegrating tablet Take 1 tablet (8 mg total) by mouth every 8 (eight) hours as needed for nausea or vomiting. (Patient not taking: Reported on 08/09/2015) 30 tablet 1  . pantoprazole (PROTONIX) 40 MG tablet Take 1 tablet (40 mg total) by mouth daily. (Patient not taking: Reported on 08/09/2015) 90 tablet 3  . pentoxifylline (TRENTAL) 400 MG CR tablet Take 400 mg by mouth 2 (two) times daily.  2  . polyethylene glycol powder (MIRALAX) powder Take 17 g by mouth daily.      [name] zolendronic acid (ZOMETA) 4 MG/5ML injection Inject 4 mg into the vein once.      [name] zolpidem (AMBIEN CR) 6.25 MG CR tablet Take 1 tablet (6.25 mg total) by mouth at bedtime as needed for sleep. (Patient not taking: Reported on 08/09/2015) 30 tablet 0   No current facility-administered medications for this visit.    SURGICAL HISTORY:  Past Surgical History  Procedure Laterality Date  . Tubal ligation    . S/p lul lung surgury  12/2007  . Lobectomy      REVIEW OF SYSTEMS:  A comprehensive review of systems was negative except for: Constitutional: positive for fatigue Neurological: positive for memory problems   PHYSICAL EXAMINATION: General appearance: alert, cooperative and no distress Head: Normocephalic, without obvious abnormality, atraumatic Neck: no adenopathy Lymph nodes: Cervical, supraclavicular, and axillary nodes normal. Resp: clear to auscultation bilaterally Back: symmetric, no curvature. ROM normal. No CVA tenderness. Cardio: regular rate and rhythm, S1, S2 normal, no murmur, click, rub or gallop GI: soft, non-tender; bowel sounds normal; no masses,  no organomegaly Extremities: extremities normal, atraumatic, no cyanosis or edema Neurologic: Alert and oriented X 3, normal strength and tone. Normal symmetric reflexes. Normal  coordination and gait  ECOG PERFORMANCE STATUS: 1 - Symptomatic but completely ambulatory  Blood pressure 150/77, pulse 59, temperature 97.8 F (36.6 C), temperature source Oral, resp. rate 18, height _0  (1.651 m), weight 124 lb 11.2 oz (56.564 kg), SpO2 100 %.  LABORATORY DATA: Lab Results  Component Value Date   WBC 5.3 07/12/2015   HGB 12.8 07/12/2015   HCT 38.3 07/12/2015   MCV 99.2 07/12/2015   PLT 236 07/12/2015      Chemistry      Component Value Date/Time   NA 141 07/12/2015 0946   NA 137 08/12/2014 1716   NA 140 04/17/2012 0855   K 3.7 07/12/2015 0946   K 3.7 08/12/2014 1716   K 4.1 04/17/2012 0855   CL 99 08/12/2014 1716   CL 100 03/04/2013 0901   CL 99 04/17/2012 0855   CO2 29 07/12/2015 0946   CO2 35* 08/12/2014 1716   CO2 29 04/17/2012 0855   BUN 18.6 07/12/2015 0946   BUN 16 08/12/2014 1716   BUN  15 04/17/2012 0855   CREATININE 1.1 07/12/2015 0946   CREATININE 1.1 08/12/2014 1716   CREATININE 1.4* 04/17/2012 0855      Component Value Date/Time   CALCIUM 9.9 07/12/2015 0946   CALCIUM 10.6* 08/12/2014 1716   CALCIUM 9.5 04/17/2012 0855   ALKPHOS 73 07/12/2015 0946   ALKPHOS 79 08/12/2014 1716   ALKPHOS 68 04/17/2012 0855   AST 20 07/12/2015 0946   AST 28 08/12/2014 1716   AST 29 04/17/2012 0855   ALT 13 07/12/2015 0946   ALT 24 08/12/2014 1716   ALT 31 04/17/2012 0855   BILITOT 0.49 07/12/2015 0946   BILITOT 0.4 08/12/2014 1716   BILITOT 0.60 04/17/2012 0855       RADIOGRAPHIC STUDIES: No results found.  ASSESSMENT AND PLAN: This is a very pleasant 74 years old white female with recurrent non-small cell lung cancer, adenocarcinoma. The patient is currently on maintenance Alimta monthly status post total of 48 cycles.  The patient has been tolerating her treatment fairly well except for few episodes of nausea , fatigue and memory loss. She is scheduled to have MRI of the brain by Dr. Tammi Klippel for evaluation of her metastatic brain lesions.   I recommended for the patient to continue her systemic chemotherapy with single agent Alimta as a scheduled. She will receive cycle #52 oday. For the low back pain, the patient will continue on Vicodin on as-needed basis She would come back for followup visit in one month for reevaluation with the next cycle of her chemotherapy after repeating CT scan of the chest, abdomen and pelvis for restaging of her disease. The patient was advised to call immediately if she has any concerning symptoms in the interval. The patient voices understanding of current disease status and treatment options and is in agreement with the current care plan.  All questions were answered. The patient knows to call the clinic with any problems, questions or concerns. We can certainly see the patient much sooner if necessary.  Disclaimer: This note was dictated with voice recognition software. Similar sounding words can inadvertently be transcribed and may not be corrected upon review.

## 2015-08-09 NOTE — Progress Notes (Signed)
Weight and vitals stable. Denies pain. Reports intermittent frontal headaches that last only 30 minutes or so and are relieved with rest. Denies having to take medication to manage headaches. Reports blurry vision is associated with these headaches. Reports her short term memory is worse. States, "I took my best friend home one night and couldn't remember how to get out of her neighborhood." Reports difficulty with word finding as well. Patient questions is alprazolam is causing her memory loss. Last MRI done 9/9.  BP 150/81 mmHg  Pulse 60  Resp 16  Wt 124 lb 12.8 oz (56.609 kg)  SpO2 100% Wt Readings from Last 3 Encounters:  08/09/15 124 lb 12.8 oz (56.609 kg)  07/12/15 124 lb 8 oz (56.473 kg)  07/06/15 121 lb (54.885 kg)

## 2015-08-10 ENCOUNTER — Telehealth: Payer: Self-pay | Admitting: *Deleted

## 2015-08-10 NOTE — Telephone Encounter (Signed)
TC from patient this morning requesting to have MRI of brain done as soon as possible due to symptoms outlined in Dr. Johny Shears note of 08/09/15.

## 2015-08-12 ENCOUNTER — Other Ambulatory Visit: Payer: Self-pay | Admitting: Radiation Oncology

## 2015-08-12 ENCOUNTER — Telehealth: Payer: Self-pay

## 2015-08-12 DIAGNOSIS — C7931 Secondary malignant neoplasm of brain: Secondary | ICD-10-CM

## 2015-08-12 NOTE — Telephone Encounter (Signed)
Pt is requesting her CT scan be moved to an earlier date - she is having problems remembering, got lost in her neighborhood, confusion, word association.  Symptoms evperience over last week.  Denies pain, headaches, change in bladder/bowel habits, temps/sx of infection.  Let pt know I would forward message to Dr. Julien Nordmann team.  Routed to pod 3

## 2015-08-13 ENCOUNTER — Emergency Department (HOSPITAL_COMMUNITY)
Admission: EM | Admit: 2015-08-13 | Discharge: 2015-08-13 | Disposition: A | Discharge status: Home / Self Care | Payer: Medicare Other | Attending: Emergency Medicine | Admitting: Emergency Medicine

## 2015-08-13 ENCOUNTER — Emergency Department (HOSPITAL_COMMUNITY): Payer: Medicare Other

## 2015-08-13 ENCOUNTER — Encounter: Payer: Self-pay | Admitting: Radiation Therapy

## 2015-08-13 ENCOUNTER — Telehealth: Payer: Self-pay

## 2015-08-13 ENCOUNTER — Encounter (HOSPITAL_COMMUNITY): Payer: Self-pay | Admitting: Emergency Medicine

## 2015-08-13 DIAGNOSIS — Z85841 Personal history of malignant neoplasm of brain: Secondary | ICD-10-CM | POA: Diagnosis not present

## 2015-08-13 DIAGNOSIS — Z85118 Personal history of other malignant neoplasm of bronchus and lung: Secondary | ICD-10-CM | POA: Diagnosis not present

## 2015-08-13 DIAGNOSIS — G4700 Insomnia, unspecified: Secondary | ICD-10-CM | POA: Diagnosis not present

## 2015-08-13 DIAGNOSIS — M810 Age-related osteoporosis without current pathological fracture: Secondary | ICD-10-CM | POA: Insufficient documentation

## 2015-08-13 DIAGNOSIS — Z79899 Other long term (current) drug therapy: Secondary | ICD-10-CM | POA: Diagnosis not present

## 2015-08-13 DIAGNOSIS — Z880 Allergy status to penicillin: Secondary | ICD-10-CM | POA: Insufficient documentation

## 2015-08-13 DIAGNOSIS — F329 Major depressive disorder, single episode, unspecified: Secondary | ICD-10-CM | POA: Insufficient documentation

## 2015-08-13 DIAGNOSIS — Z87440 Personal history of urinary (tract) infections: Secondary | ICD-10-CM | POA: Diagnosis not present

## 2015-08-13 DIAGNOSIS — R4182 Altered mental status, unspecified: Secondary | ICD-10-CM

## 2015-08-13 DIAGNOSIS — F419 Anxiety disorder, unspecified: Secondary | ICD-10-CM | POA: Diagnosis not present

## 2015-08-13 DIAGNOSIS — I10 Essential (primary) hypertension: Secondary | ICD-10-CM | POA: Diagnosis not present

## 2015-08-13 DIAGNOSIS — Z8639 Personal history of other endocrine, nutritional and metabolic disease: Secondary | ICD-10-CM | POA: Diagnosis not present

## 2015-08-13 LAB — COMPREHENSIVE METABOLIC PANEL
ALBUMIN: 4.1 g/dL (ref 3.5–5.0)
ALT: 21 U/L (ref 14–54)
ANION GAP: 8 (ref 5–15)
AST: 31 U/L (ref 15–41)
Alkaline Phosphatase: 73 U/L (ref 38–126)
BUN: 24 mg/dL — AB (ref 6–20)
CHLORIDE: 104 mmol/L (ref 101–111)
CO2: 27 mmol/L (ref 22–32)
Calcium: 9.4 mg/dL (ref 8.9–10.3)
Creatinine, Ser: 1.06 mg/dL — ABNORMAL HIGH (ref 0.44–1.00)
GFR calc Af Amer: 60 mL/min — ABNORMAL LOW (ref >60)
GFR calc non Af Amer: 52 mL/min — ABNORMAL LOW (ref >60)
GLUCOSE: 103 mg/dL — AB (ref 65–99)
POTASSIUM: 4 mmol/L (ref 3.5–5.1)
SODIUM: 139 mmol/L (ref 135–145)
TOTAL PROTEIN: 7.4 g/dL (ref 6.5–8.1)
Total Bilirubin: 0.6 mg/dL (ref 0.3–1.2)

## 2015-08-13 LAB — CBC WITH DIFFERENTIAL/PLATELET
BASOS ABS: 0 10*3/uL (ref 0.0–0.1)
BASOS PCT: 0 %
EOS ABS: 0 10*3/uL (ref 0.0–0.7)
Eosinophils Relative: 1 %
HCT: 37.6 % (ref 36.0–46.0)
Hemoglobin: 12.4 g/dL (ref 12.0–15.0)
Lymphocytes Relative: 20 %
Lymphs Abs: 1.2 10*3/uL (ref 0.7–4.0)
MCH: 33.2 pg (ref 26.0–34.0)
MCHC: 33 g/dL (ref 30.0–36.0)
MCV: 100.8 fL — ABNORMAL HIGH (ref 78.0–100.0)
MONO ABS: 0.1 10*3/uL (ref 0.1–1.0)
MONOS PCT: 1 %
NEUTROS ABS: 4.6 10*3/uL (ref 1.7–7.7)
Neutrophils Relative %: 78 %
PLATELETS: 237 10*3/uL (ref 150–400)
RBC: 3.73 MIL/uL — ABNORMAL LOW (ref 3.87–5.11)
RDW: 13 % (ref 11.5–15.5)
WBC: 5.9 10*3/uL (ref 4.0–10.5)

## 2015-08-13 LAB — URINALYSIS, ROUTINE W REFLEX MICROSCOPIC
Bilirubin Urine: NEGATIVE
Glucose, UA: NEGATIVE mg/dL
Hgb urine dipstick: NEGATIVE
KETONES UR: NEGATIVE mg/dL
LEUKOCYTES UA: NEGATIVE
Nitrite: NEGATIVE
PH: 7 (ref 5.0–8.0)
Protein, ur: NEGATIVE mg/dL
SPECIFIC GRAVITY, URINE: 1.014 (ref 1.005–1.030)
Urobilinogen, UA: 1 mg/dL (ref 0.0–1.0)

## 2015-08-13 LAB — CBG MONITORING, ED: GLUCOSE-CAPILLARY: 98 mg/dL (ref 65–99)

## 2015-08-13 MED ORDER — DEXAMETHASONE 4 MG PO TABS: 4.0000 mg | ORAL_TABLET | Freq: Two times a day (BID) | ORAL | Status: DC

## 2015-08-13 MED ORDER — GADOBENATE DIMEGLUMINE 529 MG/ML IV SOLN
15.0000 mL | Freq: Once | INTRAVENOUS | Status: AC | PRN
  Administered 2015-08-13: 11 mL via INTRAVENOUS

## 2015-08-13 NOTE — Progress Notes (Signed)
Counseling Intern Note:  Counseling session with Pt on 08/11/15. Pt presented as well groomed and oriented X4.  Her speech was clear and organized with long pauses and difficulty retrieving words.  Pt reported distress over developing symptoms that seem to be neurological (losing words, difficulty speaking, losing time, getting lost in familiar places). Pt and counselor discussed and processed possible explanations for these symptoms, including possible progression of cancer.  Pt and counselor processed emotions around this possibility an collaborating on how the patient might adjust to maintain quality of life (transportation options).    Pt reports continued concern over her blood pressure, cholesterol, and weight such that she restricts her diet to "healthy foods" rather than calorie dense foods, despite unwanted weight loss. Counselor encouraged Pt to speak to her doctors as well as the nutritionist about her current nutritional needs and suggested possibly shifting priorities in terms of nourishment to appetizing, calorie dense foods.    Pt reported  Having a strange sensation in her neck, describing it as feeling as though something were "stuck" in her throat.  Counselor encouraged Pt to communicate with her Doctors to address this concern.   FU session scheduled for 08/18/15  Vaughan Sine  Counseling Intern [phone]

## 2015-08-13 NOTE — ED Notes (Signed)
Patient refused w/c. Steady gait upon d/c.

## 2015-08-13 NOTE — ED Notes (Signed)
Patient unable to urinate at this time, patient will notify staff when able.

## 2015-08-13 NOTE — ED Provider Notes (Signed)
CSN: [id number]     Arrival date & time 08/13/15  6269 History   First MD Initiated Contact with Patient 08/13/15 0940     Chief Complaint  Patient presents with  . Altered Mental Status     (Consider location/radiation/quality/duration/timing/severity/associated sxs/prior Treatment) Patient is a 74 y.o. female presenting with altered mental status.  Altered Mental Status Presenting symptoms: confusion   Presenting symptoms: no unresponsiveness   Severity:  Moderate Most recent episode: 2 weeks ago. Episode history:  Continuous Timing:  Intermittent Progression:  Unchanged Chronicity:  New Context comment:  On chemotherapy for metastatic cancer with known brain mets Associated symptoms: no abdominal pain, no fever, no headaches, no light-headedness, no nausea and no vomiting     Past Medical History  Diagnosis Date  . Hypertension   . HYPERLIPIDEMIA 11/05/2007  . ANXIETY 05/13/2009  . DEPRESSIVE DISORDER 05/06/2008  . HYPERTENSION 11/05/2007  . ALLERGIC RHINITIS 11/05/2007  . CONSTIPATION, CHRONIC 05/13/2009  . UTI 05/06/2008  . BACK PAIN 05/11/2008  . OSTEOPOROSIS 11/05/2007  . INSOMNIA-SLEEP DISORDER-UNSPEC 05/13/2009  . FATIGUE 05/13/2009  . Swelling, mass, or lump in chest 11/05/2007  . LUNG CANCER, HX OF 05/06/2008  . 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 Lung cancer ([place])   . Brain cancer ([place])   . Neuropathy ([place]) 08/09/2015   Past Surgical History  Procedure Laterality Date  . Tubal ligation    . S/p lul lung surgury  12/2007  . Lobectomy     Family History  Problem Relation Age of Onset  . Heart disease Mother   . ALS Father   . Alcohol abuse Son     ETOH  . Cancer Neg Hx   . Colon cancer Neg Hx   . Esophageal cancer Neg Hx   . Stomach cancer Neg Hx   . Rectal cancer Neg Hx    Social History  Substance Use Topics  . Smoking status: Never Smoker   . Smokeless tobacco: Never Used  . Alcohol Use: Yes     Comment: occasionally   OB History     Gravida Para Term Preterm AB TAB SAB Ectopic Multiple Living   1 1             Review of Systems  Constitutional: Negative for fever.  Gastrointestinal: Negative for nausea, vomiting and abdominal pain.  Neurological: Negative for light-headedness and headaches.  Psychiatric/Behavioral: Positive for confusion.  All other systems reviewed and are negative.     Allergies  Amoxicillin; Levaquin; and Lorazepam  Home Medications   Prior to Admission medications   Medication Sig Start Date End Date Taking? Authorizing Provider  ALPRAZolam Duanne Moron) 1 MG tablet Take 1 mg by mouth at bedtime as needed for anxiety.   Yes Historical Provider, MD  aspirin 81 MG EC tablet Take 81 mg by mouth daily.     Yes Historical Provider, MD  Calcium Carbonate-Vitamin D (CALCIUM-VITAMIN D) 500-200 MG-UNIT per tablet Take 1 tablet by mouth daily.    Yes Historical Provider, MD  folic acid (FOLVITE) 1 MG tablet TAKE 1 TABLET BY MOUTH DAILY 03/03/15  Yes Curt Bears, MD  HYDROcodone-acetaminophen (NORCO) 5-325 MG tablet Take 1 tablet by mouth every 6 (six) hours as needed for moderate pain. 07/16/15  Yes Susanne Borders, NP  Multiple Vitamin (ANTIOXIDANT A/C/E PO) Take 1 tablet by mouth daily.    Yes Historical Provider, MD  ondansetron (ZOFRAN ODT) 8 MG disintegrating tablet  Take 1 tablet (8 mg total) by mouth every 8 (eight) hours as needed for nausea or vomiting. 06/22/15  Yes Biagio Borg, MD  pantoprazole (PROTONIX) 40 MG tablet Take 1 tablet (40 mg total) by mouth daily. Patient taking differently: Take 40 mg by mouth daily as needed (heartburn/acid reflux).  06/22/15  Yes Biagio Borg, MD  pentoxifylline (TRENTAL) 400 MG CR tablet Take 400 mg by mouth 2 (two) times daily. 03/31/15  Yes Historical Provider, MD  polyethylene glycol powder (MIRALAX) powder Take 17 g by mouth daily as needed for moderate constipation.    Yes Historical Provider, MD  PRESCRIPTION MEDICATION Chemo at [facility]   Yes Historical  Provider, MD  zolendronic acid (ZOMETA) 4 MG/5ML injection Inject 4 mg into the vein every 30 (thirty) days.    Yes Historical Provider, MD  zolpidem (AMBIEN CR) 6.25 MG CR tablet Take 1 tablet (6.25 mg total) by mouth at bedtime as needed for sleep. 07/07/15  Yes Hoyt Koch, MD  Alum & Mag Hydroxide-Simeth (MAGIC MOUTHWASH) SOLN Take 5 mLs by mouth 4 (four) times daily as needed for mouth pain. Swish and spit Patient not taking: Reported on 08/09/2015 07/15/14   Curt Bears, MD  citalopram (CELEXA) 10 MG tablet Take 1 tablet (10 mg total) by mouth daily. Patient not taking: Reported on 08/09/2015 06/22/15   Biagio Borg, MD   BP 131/75 mmHg  Pulse 60  Temp(Src) 98.4 F (36.9 C) (Oral)  Resp 17  SpO2 100% Physical Exam  Constitutional: She is oriented to person, place, and time. She appears well-developed and well-nourished.  HENT:  Head: Normocephalic and atraumatic.  Right Ear: External ear normal.  Left Ear: External ear normal.  Eyes: Conjunctivae and EOM are normal. Pupils are equal, round, and reactive to light.  Neck: Normal range of motion. Neck supple.  Cardiovascular: Normal rate, regular rhythm, normal heart sounds and intact distal pulses.   Pulmonary/Chest: Effort normal and breath sounds normal.  Abdominal: Soft. Bowel sounds are normal. There is no tenderness.  Musculoskeletal: Normal range of motion.  Neurological: She is alert and oriented to person, place, and time. She has normal strength and normal reflexes. No cranial nerve deficit or sensory deficit. Coordination normal.  Skin: Skin is warm and dry.  Vitals reviewed.   ED Course  Procedures (including critical care time) Labs Review Labs Reviewed  CBC WITH DIFFERENTIAL/PLATELET - Abnormal; Notable for the following:    RBC 3.73 (*)    MCV 100.8 (*)    All other components within normal limits  COMPREHENSIVE METABOLIC PANEL - Abnormal; Notable for the following:    Glucose, Bld 103 (*)    BUN 24  (*)    Creatinine, Ser 1.06 (*)    GFR calc non Af Amer 52 (*)    GFR calc Af Amer 60 (*)    All other components within normal limits  URINALYSIS, ROUTINE W REFLEX MICROSCOPIC (NOT AT [facility])  CBG MONITORING, ED    Imaging Review Dg Chest 2 View  08/13/2015  CLINICAL DATA:  History of lung carcinoma with brain metastatic disease, altered mental status EXAM: CHEST - 2 VIEW COMPARISON:  06/09/2015 FINDINGS: Cardiac shadow is within normal limits. Postsurgical changes are noted in the left hilum consistent with the patient's given clinical history. The lungs are well aerated without focal infiltrate or sizable effusion. Sclerotic focus is again noted an upper thoracic vertebra stable from the prior exam. No new focal abnormality is seen. IMPRESSION:  Postsurgical changes without acute abnormality. Electronically Signed   By: Inez Catalina M.D.   On: 08/13/2015 10:16   Ct Head Wo Contrast  08/13/2015  CLINICAL DATA:  Slurred speech with dysarthria. Short-term memory loss. Lung carcinoma, receiving chemotherapy EXAM: CT HEAD WITHOUT CONTRAST TECHNIQUE: Contiguous axial images were obtained from the base of the skull through the vertex without intravenous contrast. COMPARISON:  Head CT January 25, 2012 and brain MRI August 11, 2015 FINDINGS: There is mild diffuse atrophy. There is decreased attenuation in the right frontal lobe region surrounding a partially calcified lesion measuring 7 x 6 mm, likely due to treated metastasis in this area. In comparison with recent MR, however, there is somewhat more edema in the right frontal lobe, concerning for either new or progression of metastatic disease in this area. Recent infarct in this area is a differential consideration. Elsewhere there is small vessel disease in the centra semiovale bilaterally, not appreciably changed from recent MR. there is no hemorrhage or midline shift. There is no subdural or epidural fluid. Bony calvarium appears intact. The mastoid air  cells are clear. IMPRESSION: Evidence of partially calcified small metastasis in the right frontal lobe with moderate edema. Slightly inferior to this area, there is an increase in right frontal lobe edema, concerning for progression of metastatic disease in this area or possibly superimposed infarct. Given this circumstance, correlation with brain MR pre and post contrast is advised to further assess. Elsewhere there is atrophy with periventricular small vessel disease. No acute hemorrhage. No midline shift. No bony lesions. Electronically Signed   By: Lowella Grip III M.D.   On: 08/13/2015 11:05   Mr Jeri Cos GY Contrast  08/13/2015  CLINICAL DATA:  Metastatic lung cancer.  Difficulty finding words. EXAM: MRI HEAD WITHOUT AND WITH CONTRAST TECHNIQUE: Multiplanar, multiecho pulse sequences of the brain and surrounding structures were obtained without and with intravenous contrast. CONTRAST:  53m MULTIHANCE GADOBENATE DIMEGLUMINE 529 MG/ML IV SOLN COMPARISON:  MRI head 06/11/2015 FINDINGS: Known metastatic disease. Right frontal lobe lesion shows progressive edema. The enhancing mass in this area also has enlarged now measuring 13 x 10 mm compared with 9 by 9 mm previously. 4 x 5 mm enhancing nodule right frontal cortex axial image 40 is unchanged. Right convexity enhancing lesion unchanged 7 mm. Leptomeningeal enhancement is present in a sulcus above the sylvian fissure which may be due to tumor spread. Small right frontal enhancing nodule 2 mm unchanged. Other small lesions seen previously difficult to identify today. No new lesions. Mild ventricular enlargement is unchanged. Review mild hydrocephalus. Negative for acute infarct. Chronic microvascular ischemic changes in the white matter are stable. Negative for intracranial hemorrhage. IMPRESSION: Mild progression of tumor in the right frontal lobe with increase in surrounding white matter edema. Other lesions are stable to slightly improved. Ventricular  enlargement is unchanged from the prior study but progressive since 10/27/2014. Mild hydrocephalus is present. Electronically Signed   By: CFranchot GalloM.D.   On: 08/13/2015 13:46   I have personally reviewed and evaluated these images and lab results as part of my medical decision-making.   EKG Interpretation None      MDM   Final diagnoses:  None    74y.o. female with ho metastatic [place] with mets to brain currently on chemo presents with concern for increased confusion since starting chemo 2 weeks ago.  No acute worsening today.  On arrival vitals and physical exam as above.  No neuro deficits.  Pt not confused at this time.  Given ho brain mets, will obtain CT head and labs to [facility] or other emergent condition.    CT scan demonstrated ? Worsening of brain mets.  Subsequent MRI demonstrated worsening frontal met with edema.  Consulted oncology who recommended starting decadron and outpt fu.  DC home in stable condition.  1. Altered mental status, unspecified altered mental status type        Debby Freiberg, MD 08/15/15 [phone]

## 2015-08-13 NOTE — ED Notes (Signed)
Pt reports she has been having trouble finding words for the past 2 weeks that has gradually gotten worse. Pt has lung and brain ca, last chemo Monday. Pt decided to be seen today because when she woke up she did not know where she was. Pt alert and oriented 4x. Grips equal, no facial droop.

## 2015-08-13 NOTE — Discharge Instructions (Signed)
Confusion Confusion is the inability to think with your usual speed or clarity. Confusion may come on quickly or slowly over time. How quickly the confusion comes on depends on the cause. Confusion can be due to any number of causes. CAUSES   Concussion, head injury, or head trauma.  Seizures.  Stroke.  Fever.  Brain tumor.  Age related decreased brain function (dementia).  Heightened emotional states like rage or terror.  Mental illness in which the person loses the ability to determine what is real and what is not (hallucinations).  Infections such as a urinary tract infection (UTI).  Toxic effects from alcohol, drugs, or prescription medicines.  Dehydration and an imbalance of salts in the body (electrolytes).  Lack of sleep.  Low blood sugar (diabetes).  Low levels of oxygen from conditions such as chronic lung disorders.  Drug interactions or other medicine side effects.  Nutritional deficiencies, especially niacin, thiamine, vitamin C, or vitamin B.  Sudden drop in body temperature (hypothermia).  Change in routine, such as when traveling or hospitalized. SIGNS AND SYMPTOMS  People often describe their thinking as cloudy or unclear when they are confused. Confusion can also include feeling disoriented. That means you are unaware of where or who you are. You may also not know what the date or time is. If confused, you may also have difficulty paying attention, remembering, and making decisions. Some people also act aggressively when they are confused.  DIAGNOSIS  The medical evaluation of confusion may include:  Blood and urine tests.  X-rays.  Brain and nervous system tests.  Analyzing your brain waves (electroencephalogram or EEG).  Magnetic resonance imaging (MRI) of your head.  Computed tomography (CT) scan of your head.  Mental status tests in which your health care provider may ask many questions. Some of these questions may seem silly or strange,  but they are a very important test to help diagnose and treat confusion. TREATMENT  An admission to the hospital may not be needed, but a person with confusion should not be left alone. Stay with a family member or friend until the confusion clears. Avoid alcohol, pain relievers, or sedative drugs until you have fully recovered. Do not drive until directed by your health care provider. HOME CARE INSTRUCTIONS  What family and friends can do:  To find out if someone is confused, ask the person to state his or her name, age, and the date. If the person is unsure or answers incorrectly, he or she is confused.  Always introduce yourself, no matter how well the person knows you.  Often remind the person of his or her location.  Place a calendar and clock near the confused person.  Help the person with his or her medicines. You may want to use a pill box, an alarm as a reminder, or give the person each dose as prescribed.  Talk about current events and plans for the day.  Try to keep the environment calm, quiet, and peaceful.  Make sure the person keeps follow-up visits with his or her health care provider. PREVENTION  Ways to prevent confusion:  Avoid alcohol.  Eat a balanced diet.  Get enough sleep.  Take medicine only as directed by your health care provider.  Do not become isolated. Spend time with other people and make plans for your days.  Keep careful watch on your blood sugar levels if you are diabetic. SEEK IMMEDIATE MEDICAL CARE IF:   You develop severe headaches, repeated vomiting, seizures, blackouts, or  slurred speech.  There is increasing confusion, weakness, numbness, restlessness, or personality changes.  You develop a loss of balance, have marked dizziness, feel uncoordinated, or fall.  You have delusions, hallucinations, or develop severe anxiety.  Your family members think you need to be rechecked.   This information is not intended to replace advice given  to you by your health care provider. Make sure you discuss any questions you have with your health care provider.   Document Released: 10/26/2004 Document Revised: 10/09/2014 Document Reviewed: 10/24/2013 Elsevier Interactive Patient Education 2016 Elsevier Inc. Cerebral Edema Cerebral edema is another term for swelling of the brain. Cells in the brain that have been injured or infected can swell, and blood vessels may leak fluid into the brain tissue. The extra fluid in the brain affects pressure in the skull and the flow of blood in the brain.  Cerebral edema can be mild to severe, depending on the cause and whether the swelling is in many areas of the brain. It is important to identify the cause and stop the swelling from getting worse as severe cases can cause permanent damage. Treatments are available to help bring down the swelling and restore brain function.  CAUSES   Injury to your head, such as in a car accident or contact sport.  Infection from a virus or bacteria.  Stroke.  High altitude sickness.  A buildup of acid in your blood.  Very high sugar levels caused by diabetes (diabetic ketoacidosis).  Very high blood pressure (malignant hypertension).  Liver disease.  Poisoning, such as carbon monoxide or lead.  Conditions in which your body retains too much water.  Illegal drug use.  Alcohol abuse.  Reptile or marine animal bites.  Brain tumor. SIGNS AND SYMPTOMS Symptoms may include:  Headache.  Nausea or vomiting.  Dizziness.  Neck pain.  Trouble breathing.  Blurred vision or other vision changes.  Balance and motor problems, such as trouble walking.  Trouble speaking.  Feeling confused.  Passing out.  Numbness or weakness in any body part. DIAGNOSIS  Diagnosis may include:  Medical history and physical exam.  Imaging tests, including CT scans or MRI.  Blood tests. TREATMENT  For mild edema, several days of rest and limiting activities  may be needed. More severe cases require treatment in a hospital. Treatment may include:  Proper head and neck positioning to increase blood flow.  Fluids given through your vein (intravenously or IV).  Regulating your blood pressure and body temperature.  Medicines to reduce inflammation and increase urination.  Oxygen.  Using a ventilator, which is a device to help you breathe.  Shunting, which is a tube placed in the brain to help drain fluid.  Surgery. If the edema is caused by a condition, treatment will also focus on managing the condition.  HOME CARE INSTRUCTIONS   Rest and limit your activities as directed by your health care provider.  Take medicines only as directed by your health care provider.  Manage any other health care conditions you may have, if applicable.  Keep all follow-up visits as directed by your health care provider. This is important. SEEK MEDICAL CARE IF:  Your symptoms return, do not get better, or get worse, including:  Headache.  Nausea or vomiting.  Dizziness.  Neck pain.  Blurred vision or other vision changes. SEEK IMMEDIATE MEDICAL CARE IF:  You pass out.   You have numbness or weakness in any body part.  You feel confused.  You have trouble breathing.  You have balance and motor problems, such as trouble walking.  You have trouble speaking. MAKE SURE YOU:  Understand these instructions.   Will watch your condition.  Will get help right away if you are not doing well or get worse.    This information is not intended to replace advice given to you by your health care provider. Make sure you discuss any questions you have with your health care provider.   Document Released: 04/15/2014 Document Reviewed: 04/15/2014 Elsevier Interactive Patient Education [employer].

## 2015-08-13 NOTE — Telephone Encounter (Signed)
Pt called stating she believes she may have had a stroke, Her speech was slurred and she was having a difficult time finding and using the correct words. Pt states that she notice these changes a few days ago when she was dropping her friend home and could not remember her way home. Pt wanted to make an appt with [place] but I advised that she go to the ER ASAP and that she have someone else drive her. Pt agreed and states she will proceed to ER immediately.

## 2015-08-13 NOTE — Progress Notes (Signed)
1.  Do you need a wheel chair?   no   2. On oxygen?  no  3. Have you ever had any surgery in the body part being scanned?  no  4. Have you ever had any surgery on your brain or heart?   no                          5. Have you ever had surgery on your eyes or ears?  no                                              6. Do you have a pacemaker or defibrillator?  no   7. Do you have a Neurostimulator?   no 8. Claustrophobic? no  9. Any risk for metal in eyes?  no  10. Injury by bullet, buckshot, or shrapnel?  no  11. Stent?   no                                                                                                                    12. Hx of Cancer?  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 Kidney or Liver disease?  no  14. Hx of Lupus, Rheumatoid Arthritis or Scleroderma?  no  15. IV Antibiotics or long term use of NSAIDS?  no  16. HX of Hypertension?  no  17. Diabetes?  no  18. Allergy to contrast?  no  19. Recent labs. Yes- in EPIC

## 2015-08-13 NOTE — ED Notes (Signed)
MD at bedside. 

## 2015-08-16 ENCOUNTER — Encounter: Payer: Self-pay | Admitting: Radiation Therapy

## 2015-08-16 NOTE — Telephone Encounter (Addendum)
Decadron started Friday evening. Pt stated she has 28 tablets left -Dees she need refill? Does she need to see Dr Tammi Klippel? Note to [place].

## 2015-08-16 NOTE — Progress Notes (Signed)
Based on Tamara Ortiz's recent MRI, Dr. Tammi Klippel asked that we reach out to her neurosurgeon to weigh in and see if resection is reasonable at this time. Dr. Kathyrn Sheriff reviewed her scans and feels that resection of the progressing treated area is a reasonable next step. His office secretary will call the patient with an appointment to see him. I let Tamara Ortiz know to expect her call.  [name] R.T. (R) (T) [facility] [place] [phone] Office [phone] Pager [phone] Fax [email or web address]

## 2015-08-17 ENCOUNTER — Ambulatory Visit (HOSPITAL_BASED_OUTPATIENT_CLINIC_OR_DEPARTMENT_OTHER): Payer: Medicare Other

## 2015-08-17 ENCOUNTER — Other Ambulatory Visit: Payer: Self-pay | Admitting: *Deleted

## 2015-08-17 ENCOUNTER — Telehealth: Payer: Self-pay | Admitting: *Deleted

## 2015-08-17 VITALS — BP 127/75 | HR 63

## 2015-08-17 DIAGNOSIS — C7951 Secondary malignant neoplasm of bone: Secondary | ICD-10-CM | POA: Diagnosis not present

## 2015-08-17 DIAGNOSIS — C7931 Secondary malignant neoplasm of brain: Secondary | ICD-10-CM | POA: Diagnosis not present

## 2015-08-17 DIAGNOSIS — C3492 Malignant neoplasm of unspecified part of left bronchus or lung: Secondary | ICD-10-CM

## 2015-08-17 DIAGNOSIS — R197 Diarrhea, unspecified: Secondary | ICD-10-CM

## 2015-08-17 DIAGNOSIS — E860 Dehydration: Secondary | ICD-10-CM

## 2015-08-17 DIAGNOSIS — C3412 Malignant neoplasm of upper lobe, left bronchus or lung: Secondary | ICD-10-CM

## 2015-08-17 MED ORDER — SODIUM CHLORIDE 0.9 % IV SOLN
Freq: Once | INTRAVENOUS | Status: AC
  Administered 2015-08-17: 14:00:00 via INTRAVENOUS

## 2015-08-17 MED ORDER — SODIUM CHLORIDE 0.9 % IV SOLN: 1000.0000 mL | INTRAVENOUS | Status: DC

## 2015-08-17 MED ORDER — SODIUM CHLORIDE 0.9 % IV SOLN
Freq: Once | INTRAVENOUS | Status: DC
  Filled 2015-08-17: qty 4

## 2015-08-17 MED ORDER — SODIUM CHLORIDE 0.9 % IV SOLN
Freq: Once | INTRAVENOUS | Status: AC
  Administered 2015-08-17: 14:00:00 via INTRAVENOUS
  Filled 2015-08-17: qty 4

## 2015-08-17 NOTE — Progress Notes (Signed)
Pt c/o new onset diarrhea x 2 days. States was taking 1 imodium after each episode without relief. Pt is not currently or recently been on antibiotics. This Probation officer spoke with Dr. Julien Nordmann who stated pt should increase her imodium after each episode of diarrhea and to encourage hydration. Pt educated to take 2 imodium after the first episode and then one after each additional episode, but not more than 8 tablets in one day. Pt also educated to increase fluids and that jello counts as a fluid. Pt expressed understanding.

## 2015-08-17 NOTE — Telephone Encounter (Signed)
[place] 0812 voicemail: "I've been sick last night with chills and not eating much, dehydrated I think.  Dr. Worthy Flank nurse spoke with me yesterday.  I need to talk with Dr. Tammi Klippel and urologist Dr. Suzie Portela today.  Please call [phone]."

## 2015-08-17 NOTE — Patient Instructions (Signed)

## 2015-08-17 NOTE — Telephone Encounter (Signed)
MD is aware, NS and Zofran entered.

## 2015-08-17 NOTE — Telephone Encounter (Signed)
Patient called stating that she has not been drinking/eating and feeling weak/fatigued. Patient requested to come in for fluids. Patient is scheduled for 130 pm today for fluids. RN notified patient and she verbalized understanding.

## 2015-08-18 ENCOUNTER — Inpatient Hospital Stay: Admit: 2015-08-18 | Payer: Medicare Other

## 2015-08-18 ENCOUNTER — Other Ambulatory Visit: Payer: Self-pay | Admitting: Neurosurgery

## 2015-08-18 ENCOUNTER — Telehealth: Payer: Self-pay | Admitting: *Deleted

## 2015-08-18 DIAGNOSIS — C3492 Malignant neoplasm of unspecified part of left bronchus or lung: Secondary | ICD-10-CM

## 2015-08-18 DIAGNOSIS — C7951 Secondary malignant neoplasm of bone: Secondary | ICD-10-CM

## 2015-08-18 MED ORDER — HYDROCODONE-ACETAMINOPHEN 5-325 MG PO TABS: 1.0000 | ORAL_TABLET | Freq: Four times a day (QID) | ORAL | Status: DC | PRN

## 2015-08-18 NOTE — Telephone Encounter (Signed)
Notified pt rx will be ready for pick up tomorrow anytime between 830-4pm Pt advised she just went to neuro surgeon and will have brain cancer surgery on Monday or Tuesday after thanksgiving x 3days

## 2015-08-18 NOTE — Telephone Encounter (Signed)
VM message received at 12:09 pm. Pt requesting refill on her Hydrocodone/APAP 5/325. Last filled on 07/16/15 for 30 tabs. Please call pt when prescription is available for pick up.

## 2015-08-19 ENCOUNTER — Other Ambulatory Visit: Payer: Self-pay | Admitting: Neurosurgery

## 2015-08-19 DIAGNOSIS — C7931 Secondary malignant neoplasm of brain: Secondary | ICD-10-CM

## 2015-08-20 ENCOUNTER — Encounter (HOSPITAL_COMMUNITY): Payer: Self-pay

## 2015-08-20 ENCOUNTER — Encounter (HOSPITAL_COMMUNITY)
Admission: RE | Admit: 2015-08-20 | Discharge: 2015-08-20 | Disposition: A | Discharge status: Home / Self Care | Payer: Medicare Other | Source: Ambulatory Visit | Attending: Neurosurgery | Admitting: Neurosurgery

## 2015-08-20 DIAGNOSIS — C801 Malignant (primary) neoplasm, unspecified: Secondary | ICD-10-CM | POA: Insufficient documentation

## 2015-08-20 DIAGNOSIS — Z0183 Encounter for blood typing: Secondary | ICD-10-CM | POA: Insufficient documentation

## 2015-08-20 DIAGNOSIS — C7931 Secondary malignant neoplasm of brain: Secondary | ICD-10-CM | POA: Diagnosis not present

## 2015-08-20 DIAGNOSIS — Z01812 Encounter for preprocedural laboratory examination: Secondary | ICD-10-CM | POA: Diagnosis not present

## 2015-08-20 HISTORY — DX: Unspecified osteoarthritis, unspecified site: M19.90

## 2015-08-20 LAB — CBC
HEMATOCRIT: 33.6 % — AB (ref 36.0–46.0)
HEMOGLOBIN: 11.2 g/dL — AB (ref 12.0–15.0)
MCH: 32.8 pg (ref 26.0–34.0)
MCHC: 33.3 g/dL (ref 30.0–36.0)
MCV: 98.5 fL (ref 78.0–100.0)
PLATELETS: 133 10*3/uL — AB (ref 150–400)
RBC: 3.41 MIL/uL — ABNORMAL LOW (ref 3.87–5.11)
RDW: 12.8 % (ref 11.5–15.5)
WBC: 4.3 10*3/uL (ref 4.0–10.5)

## 2015-08-20 LAB — BASIC METABOLIC PANEL
Anion gap: 8 (ref 5–15)
BUN: 19 mg/dL (ref 6–20)
CALCIUM: 9.1 mg/dL (ref 8.9–10.3)
CO2: 25 mmol/L (ref 22–32)
Chloride: 102 mmol/L (ref 101–111)
Creatinine, Ser: 1.26 mg/dL — ABNORMAL HIGH (ref 0.44–1.00)
GFR calc Af Amer: 48 mL/min — ABNORMAL LOW (ref >60)
GFR, EST NON AFRICAN AMERICAN: 42 mL/min — AB (ref >60)
GLUCOSE: 141 mg/dL — AB (ref 65–99)
Potassium: 4 mmol/L (ref 3.5–5.1)
Sodium: 135 mmol/L (ref 135–145)

## 2015-08-20 LAB — TYPE AND SCREEN
ABO/RH(D): A POS
Antibody Screen: NEGATIVE

## 2015-08-20 NOTE — Progress Notes (Signed)
Pt. Denies any chest problems, denies any advanced cardiac testing in the past. PCP- Dr. Jenny Reichmann- [place]

## 2015-08-20 NOTE — Pre-Procedure Instructions (Signed)
Tamara Ortiz  08/20/2015      [id number] - [place], [place] [address] [place] [address] Phone: [phone] Fax: [phone]    Your procedure is scheduled on Nov 30  Report to [facility] at 900 A.M.  Call this number if you have problems the morning of surgery:  [phone]   Remember:  Do not eat food or drink liquids after midnight.  Take these medicines the morning of surgery with A SIP OF WATER : Dexamethasone;     in addition take medicine for reflux if needed, also pain medicine if needed & medicine for nausea is OK the morning of surgery     Do not wear jewelry, make-up or nail polish.   Do not wear lotions, powders, or perfumes.  You may wear deodorant.   Do not shave 48 hours prior to surgery.    Do not bring valuables to the hospital.   [facility] is not responsible for any belongings or valuables.  Contacts, dentures or bridgework may not be worn into surgery.  Leave your suitcase in the car.  After surgery it may be brought to your room.  For patients admitted to the hospital, discharge time will be determined by your treatment team.  Patients discharged the day of surgery will not be allowed to drive home.    Special instructions:  [facility] - Preparing for Surgery  Before surgery, you can play an important role.  Because skin is not sterile, your skin needs to be as free of germs as possible.  You can reduce the number of germs on you skin by washing with CHG (chlorahexidine gluconate) soap before surgery.  CHG is an antiseptic cleaner which kills germs and bonds with the skin to continue killing germs even after washing.  Please DO NOT use if you have an allergy to CHG or antibacterial soaps.  If your skin becomes reddened/irritated stop using the CHG and inform your nurse when you arrive at Short Stay.  Do not shave (including legs and  underarms) for at least 48 hours prior to the first CHG shower.  You may shave your face.  Please follow these instructions carefully:   1.  Shower with CHG Soap the night before surgery and the                                morning of Surgery.  2.  If you choose to wash your hair, wash your hair first as usual with your       normal shampoo.  3.  After you shampoo, rinse your hair and body thoroughly to remove the                      Shampoo.  4.  Use CHG as you would any other liquid soap.  You can apply chg directly       to the skin and wash gently with scrungie or a clean washcloth.  5.  Apply the CHG Soap to your body ONLY FROM THE NECK DOWN.        Do not use on open wounds or open sores.  Avoid contact with your eyes,       ears, mouth and genitals (private parts).  Wash genitals (private parts)  with your normal soap.  6.  Wash thoroughly, paying special attention to the area where your surgery        will be performed.  7.  Thoroughly rinse your body with warm water from the neck down.  8.  DO NOT shower/wash with your normal soap after using and rinsing off       the CHG Soap.  9.  Pat yourself dry with a clean towel.            10.  Wear clean pajamas.            11.  Place clean sheets on your bed the night of your first shower and do not        sleep with pets.  Day of Surgery  Do not apply any lotions/deoderants the morning of surgery.  Please wear clean clothes to the hospital/surgery center.     Please read over the following fact sheets that you were given. Pain Booklet, Coughing and Deep Breathing, Blood Transfusion Information and Surgical Site Infection Prevention

## 2015-08-25 ENCOUNTER — Ambulatory Visit (HOSPITAL_COMMUNITY): Payer: Medicare Other

## 2015-08-31 ENCOUNTER — Other Ambulatory Visit (HOSPITAL_COMMUNITY): Payer: Self-pay | Admitting: Neurosurgery

## 2015-08-31 DIAGNOSIS — C7931 Secondary malignant neoplasm of brain: Secondary | ICD-10-CM

## 2015-08-31 MED ORDER — VANCOMYCIN HCL IN DEXTROSE 1-5 GM/200ML-% IV SOLN
1000.0000 mg | INTRAVENOUS | Status: DC
  Filled 2015-08-31: qty 200

## 2015-09-01 ENCOUNTER — Encounter (HOSPITAL_COMMUNITY): Payer: Self-pay | Admitting: Certified Registered Nurse Anesthetist

## 2015-09-01 ENCOUNTER — Inpatient Hospital Stay (HOSPITAL_COMMUNITY)
Admission: RE | Admit: 2015-09-01 | Discharge: 2015-09-08 | DRG: 026 | Disposition: A | Discharge status: Skilled Nursing Facility | Payer: Medicare Other | Source: Ambulatory Visit | Attending: Neurosurgery | Admitting: Neurosurgery

## 2015-09-01 ENCOUNTER — Ambulatory Visit (HOSPITAL_COMMUNITY)
Admission: RE | Admit: 2015-09-01 | Discharge: 2015-09-01 | Disposition: A | Discharge status: Home / Self Care | Payer: Medicare Other | Source: Ambulatory Visit | Attending: Neurosurgery | Admitting: Neurosurgery

## 2015-09-01 DIAGNOSIS — C7931 Secondary malignant neoplasm of brain: Principal | ICD-10-CM

## 2015-09-01 DIAGNOSIS — F419 Anxiety disorder, unspecified: Secondary | ICD-10-CM | POA: Diagnosis present

## 2015-09-01 DIAGNOSIS — J309 Allergic rhinitis, unspecified: Secondary | ICD-10-CM | POA: Diagnosis present

## 2015-09-01 DIAGNOSIS — F329 Major depressive disorder, single episode, unspecified: Secondary | ICD-10-CM | POA: Diagnosis present

## 2015-09-01 DIAGNOSIS — C7951 Secondary malignant neoplasm of bone: Secondary | ICD-10-CM | POA: Diagnosis present

## 2015-09-01 DIAGNOSIS — I10 Essential (primary) hypertension: Secondary | ICD-10-CM | POA: Diagnosis present

## 2015-09-01 DIAGNOSIS — M810 Age-related osteoporosis without current pathological fracture: Secondary | ICD-10-CM | POA: Diagnosis present

## 2015-09-01 DIAGNOSIS — Z79899 Other long term (current) drug therapy: Secondary | ICD-10-CM

## 2015-09-01 DIAGNOSIS — G4700 Insomnia, unspecified: Secondary | ICD-10-CM | POA: Diagnosis present

## 2015-09-01 DIAGNOSIS — Z85118 Personal history of other malignant neoplasm of bronchus and lung: Secondary | ICD-10-CM

## 2015-09-01 DIAGNOSIS — K5900 Constipation, unspecified: Secondary | ICD-10-CM | POA: Diagnosis present

## 2015-09-01 DIAGNOSIS — Z01818 Encounter for other preprocedural examination: Secondary | ICD-10-CM | POA: Insufficient documentation

## 2015-09-01 DIAGNOSIS — D62 Acute posthemorrhagic anemia: Secondary | ICD-10-CM | POA: Diagnosis not present

## 2015-09-01 DIAGNOSIS — E785 Hyperlipidemia, unspecified: Secondary | ICD-10-CM | POA: Diagnosis present

## 2015-09-01 DIAGNOSIS — G629 Polyneuropathy, unspecified: Secondary | ICD-10-CM | POA: Diagnosis present

## 2015-09-01 DIAGNOSIS — G8918 Other acute postprocedural pain: Secondary | ICD-10-CM | POA: Diagnosis not present

## 2015-09-01 LAB — POCT I-STAT CREATININE: CREATININE: 1.2 mg/dL — AB (ref 0.44–1.00)

## 2015-09-01 MED ORDER — ONDANSETRON 4 MG PO TBDP
8.0000 mg | ORAL_TABLET | Freq: Three times a day (TID) | ORAL | Status: DC | PRN
  Filled 2015-09-01: qty 2

## 2015-09-01 MED ORDER — VITAMIN C 500 MG PO TABS
500.0000 mg | ORAL_TABLET | Freq: Every day | ORAL | Status: DC
  Administered 2015-09-01 – 2015-09-08 (×7): 500 mg via ORAL
  Filled 2015-09-01 (×7): qty 1

## 2015-09-01 MED ORDER — MIDAZOLAM HCL 2 MG/2ML IJ SOLN
INTRAMUSCULAR | Status: AC
  Filled 2015-09-01: qty 2

## 2015-09-01 MED ORDER — FENTANYL CITRATE (PF) 250 MCG/5ML IJ SOLN
INTRAMUSCULAR | Status: AC
  Filled 2015-09-01: qty 5

## 2015-09-01 MED ORDER — ZOLPIDEM TARTRATE 5 MG PO TABS
5.0000 mg | ORAL_TABLET | Freq: Every evening | ORAL | Status: DC | PRN
  Administered 2015-09-01 – 2015-09-07 (×4): 5 mg via ORAL
  Filled 2015-09-01 (×4): qty 1

## 2015-09-01 MED ORDER — LACTATED RINGERS IV SOLN
INTRAVENOUS | Status: DC
  Administered 2015-09-01: 50 mL/h via INTRAVENOUS

## 2015-09-01 MED ORDER — PROCHLORPERAZINE MALEATE 10 MG PO TABS
10.0000 mg | ORAL_TABLET | Freq: Four times a day (QID) | ORAL | Status: DC | PRN
  Filled 2015-09-01: qty 1

## 2015-09-01 MED ORDER — POLYETHYLENE GLYCOL 3350 17 G PO PACK
17.0000 g | PACK | Freq: Every day | ORAL | Status: DC | PRN
  Administered 2015-09-05: 17 g via ORAL
  Filled 2015-09-01: qty 1

## 2015-09-01 MED ORDER — ONDANSETRON HCL 4 MG/2ML IJ SOLN
INTRAMUSCULAR | Status: AC
  Filled 2015-09-01: qty 2

## 2015-09-01 MED ORDER — GADOBENATE DIMEGLUMINE 529 MG/ML IV SOLN
10.0000 mL | Freq: Once | INTRAVENOUS | Status: AC
  Administered 2015-09-01: 10 mL via INTRAVENOUS

## 2015-09-01 MED ORDER — FOLIC ACID 1 MG PO TABS
1.0000 mg | ORAL_TABLET | Freq: Every day | ORAL | Status: DC
  Administered 2015-09-01 – 2015-09-08 (×7): 1 mg via ORAL
  Filled 2015-09-01 (×7): qty 1

## 2015-09-01 MED ORDER — SODIUM CHLORIDE 0.9 % IV SOLN
INTRAVENOUS | Status: DC
Start: 2015-09-01 — End: 2015-09-08
  Administered 2015-09-01 – 2015-09-03 (×5): via INTRAVENOUS

## 2015-09-01 MED ORDER — DOCUSATE SODIUM 100 MG PO CAPS
100.0000 mg | ORAL_CAPSULE | Freq: Two times a day (BID) | ORAL | Status: DC
  Administered 2015-09-03 – 2015-09-08 (×11): 100 mg via ORAL
  Filled 2015-09-01 (×12): qty 1

## 2015-09-01 MED ORDER — ROCURONIUM BROMIDE 50 MG/5ML IV SOLN
INTRAVENOUS | Status: AC
  Filled 2015-09-01: qty 1

## 2015-09-01 MED ORDER — SENNA 8.6 MG PO TABS
1.0000 | ORAL_TABLET | Freq: Two times a day (BID) | ORAL | Status: DC
  Administered 2015-09-03 – 2015-09-08 (×11): 8.6 mg via ORAL
  Filled 2015-09-01 (×12): qty 1

## 2015-09-01 MED ORDER — LIDOCAINE HCL (CARDIAC) 20 MG/ML IV SOLN
INTRAVENOUS | Status: AC
  Filled 2015-09-01: qty 5

## 2015-09-01 MED ORDER — ADULT MULTIVITAMIN W/MINERALS CH
1.0000 | ORAL_TABLET | Freq: Every day | ORAL | Status: DC
  Administered 2015-09-01 – 2015-09-08 (×7): 1 via ORAL
  Filled 2015-09-01 (×8): qty 1

## 2015-09-01 MED ORDER — BISACODYL 10 MG RE SUPP: 10.0000 mg | Freq: Every day | RECTAL | Status: DC | PRN

## 2015-09-01 MED ORDER — DEXAMETHASONE 4 MG PO TABS
4.0000 mg | ORAL_TABLET | Freq: Two times a day (BID) | ORAL | Status: DC
  Administered 2015-09-01 – 2015-09-08 (×13): 4 mg via ORAL
  Filled 2015-09-01 (×13): qty 1

## 2015-09-01 MED ORDER — PROPOFOL 10 MG/ML IV BOLUS
INTRAVENOUS | Status: AC
  Filled 2015-09-01: qty 40

## 2015-09-01 MED ORDER — VITAMIN D 1000 UNITS PO TABS
1000.0000 [IU] | ORAL_TABLET | Freq: Every day | ORAL | Status: DC
  Administered 2015-09-01 – 2015-09-08 (×7): 1000 [IU] via ORAL
  Filled 2015-09-01 (×7): qty 1

## 2015-09-01 MED ORDER — CALCIUM CARBONATE-VITAMIN D 500-200 MG-UNIT PO TABS
1.0000 | ORAL_TABLET | Freq: Every day | ORAL | Status: DC
  Administered 2015-09-01 – 2015-09-08 (×7): 1 via ORAL
  Filled 2015-09-01 (×8): qty 1

## 2015-09-01 MED ORDER — HYDROCODONE-ACETAMINOPHEN 5-325 MG PO TABS
1.0000 | ORAL_TABLET | Freq: Four times a day (QID) | ORAL | Status: DC | PRN
  Administered 2015-09-02 – 2015-09-05 (×7): 1 via ORAL
  Filled 2015-09-01 (×8): qty 1

## 2015-09-01 NOTE — Progress Notes (Signed)
Called Dr. Kathyrn Sheriff regarding no beds on [facility], okay for pt to go to [facility] or [facility]. Margo with patient placement also called and made aware. STAT clean placed on [facility] 18, awaiting call back from her.

## 2015-09-01 NOTE — Progress Notes (Signed)
Patient surgery rescheduled for 09/02/15 due to op room not having equipment.  Patient to be admitted.

## 2015-09-01 NOTE — Progress Notes (Signed)
Pt has bed, [facility] 18. Service Response called twice regarding dinner tray that was never delivered, to deliver to pt's room. Attempted to call report to Remo Lipps, RN, no answer.

## 2015-09-01 NOTE — Progress Notes (Signed)
Pt requested Ambien to sleep tonight and it is listed as a home med that she take every night.  MD notified and ordered Ambien.  Will continue to monitor.   Fredrich Romans, RN

## 2015-09-01 NOTE — Progress Notes (Signed)
Report given to Remo Lipps, Therapist, sports. Dinner tray delivered. Pt transported to [facility] 18 accompanied by this nurse with chart and personal belongings. Condition stable at time of transfer.

## 2015-09-01 NOTE — H&P (Signed)
CC:  Brain tumor  HPI: Tamara Ortiz is a 74 year old woman I'm seeing with a history of metastatic non-small cell lung cancer, and was treated with stereotactic radiosurgery to 4 brain metastases back in October 2015.  She has undergone routine surveillance MRIs since then.  Unfortunately, over the last few months, MRI scans have demonstrated progressive enlargement of the right frontal enhancing lesion, with increased associated edema.  Over the last few months, the patient has also been complaining of worsening short-term memory, mild confusion, and speech difficulty.  She does also complain of lack of appetite, and generalized fatigue. She presents for resection of the enlarging right frontal lesion.   PMH: Past Medical History  Diagnosis Date  . Hypertension   . HYPERLIPIDEMIA 11/05/2007  . ANXIETY 05/13/2009  . DEPRESSIVE DISORDER 05/06/2008  . HYPERTENSION 11/05/2007  . ALLERGIC RHINITIS 11/05/2007  . CONSTIPATION, CHRONIC 05/13/2009  . UTI 05/06/2008  . BACK PAIN 05/11/2008  . OSTEOPOROSIS 11/05/2007  . INSOMNIA-SLEEP DISORDER-UNSPEC 05/13/2009  . FATIGUE 05/13/2009  . Swelling, mass, or lump in chest 11/05/2007  . LUNG CANCER, HX OF 05/06/2008  . Neuropathy ([place]) 08/09/2015  . Arthritis   . 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 Lung cancer ([place])   . Brain cancer ([place])     PSH: Past Surgical History  Procedure Laterality Date  . Tubal ligation    . S/p lul lung surgury  12/2007  . Lobectomy    . Eye surgery      cataracts removed. IOL , bilateral   . Tonsillectomy    . Appendectomy      SH: Social History  Substance Use Topics  . Smoking status: Never Smoker   . Smokeless tobacco: Never Used  . Alcohol Use: Yes     Comment: occasionally    MEDS: Prior to Admission medications   Medication Sig Start Date End Date Taking? Authorizing Provider  Calcium Carbonate-Vitamin D (CALCIUM-VITAMIN D) 500-200 MG-UNIT per tablet Take 1 tablet by mouth daily.    Yes  Historical Provider, MD  cholecalciferol (VITAMIN D) 1000 UNITS tablet Take 1,000 Units by mouth daily.   Yes Historical Provider, MD  dexamethasone (DECADRON) 4 MG tablet Take 1 tablet (4 mg total) by mouth 2 (two) times daily. 08/13/15  Yes Debby Freiberg, MD  folic acid (FOLVITE) 1 MG tablet TAKE 1 TABLET BY MOUTH DAILY 03/03/15  Yes Curt Bears, MD  HYDROcodone-acetaminophen (NORCO) 5-325 MG tablet Take 1 tablet by mouth every 6 (six) hours as needed for moderate pain. 08/18/15  Yes Curt Bears, MD  Multiple Vitamin (MULTIVITAMIN WITH MINERALS) TABS tablet Take 1 tablet by mouth daily. Women's One A Day   Yes Historical Provider, MD  polyethylene glycol powder (MIRALAX) powder Take 17 g by mouth daily as needed for moderate constipation.    Yes Historical Provider, MD  PRESCRIPTION MEDICATION Chemo at [facility]   Yes Historical Provider, MD  prochlorperazine (COMPAZINE) 10 MG tablet Take 10 mg by mouth every 6 (six) hours as needed for nausea or vomiting.   Yes Historical Provider, MD  vitamin C (ASCORBIC ACID) 500 MG tablet Take 500 mg by mouth daily.   Yes Historical Provider, MD  zolendronic acid (ZOMETA) 4 MG/5ML injection Inject 4 mg into the vein every 30 (thirty) days.    Yes Historical Provider, MD  zolpidem (AMBIEN CR) 6.25 MG CR tablet Take 1 tablet (6.25 mg total) by mouth at bedtime as needed for sleep.  Patient taking differently: Take 6.25 mg by mouth at bedtime.  07/07/15  Yes Hoyt Koch, MD  ondansetron (ZOFRAN ODT) 8 MG disintegrating tablet Take 1 tablet (8 mg total) by mouth every 8 (eight) hours as needed for nausea or vomiting. 06/22/15   Biagio Borg, MD  pantoprazole (PROTONIX) 40 MG tablet Take 1 tablet (40 mg total) by mouth daily. Patient not taking: Reported on 08/20/2015 06/22/15   Biagio Borg, MD    ALLERGY: Allergies  Allergen Reactions  . Amoxicillin Hives and Itching    Has patient had a PCN reaction causing immediate rash, facial/tongue/throat  swelling, SOB or lightheadedness with hypotension: -can't remember Has patient had a PCN reaction causing severe rash involving mucus membranes or skin necrosis: cant remember Has patient had a PCN reaction that required hospitalization Yes- ed visit Has patient had a PCN reaction occurring within the last 10 years: No If all of the above answers are "NO", then may proceed with Cephalosporin use.   [place] [Levofloxacin] Nausea And Vomiting  . Lorazepam Other (See Comments)    confusion    ROS: ROS  NEUROLOGIC EXAM: Awake, alert, oriented Memory and concentration grossly intact Speech fluent, appropriate CN grossly intact Motor exam: Upper Extremities Deltoid Bicep Tricep Grip  Right 5/5 5/5 5/5 5/5  Left 5/5 5/5 5/5 5/5   Lower Extremity IP Quad PF DF EHL  Right 5/5 5/5 5/5 5/5 5/5  Left 5/5 5/5 5/5 5/5 5/5   Sensation grossly intact to LT  [facility]: MRI of the brain with and without contrast dated 08/13/15 was reviewed.  This was compared to prior MRI of 9 and 916.  This demonstrates enlargement of the right frontal convexity lesion now measuring approximately 13-1/2 mm, previously approximately 8.5 mm.  There is also increasing surrounding edema.  The remainder of the other lesions appear stable to improved.  IMPRESSION: 74 year old woman with progressive enlargement of enhancing lesion in the right frontal lobe, s/p treatment with stereotactic radiosurgery.  This could represent continued tumor growth, versus radiation necrosis.  Given the surrounding edema and enlargement, I would recommend surgical resection.  PLAN: Stereotactic right frontal craniotomy for resection of the lesion  I reviewed the MRI scan with the patient and her son in the office.  Options were discussed, including my recommendation for resection of the enlarging lesion.  Risks of the surgery including left-sided weakness, paralysis, coma, death, seizure, hydrocephalus, bleeding, and wound breakdown  were all reviewed.  All questions were answered.  The patient and her son are willing to proceed with surgery as above.

## 2015-09-02 ENCOUNTER — Encounter (HOSPITAL_COMMUNITY)
Admission: RE | Disposition: A | Discharge status: Skilled Nursing Facility | Payer: Self-pay | Source: Ambulatory Visit | Attending: Neurosurgery

## 2015-09-02 ENCOUNTER — Inpatient Hospital Stay (HOSPITAL_COMMUNITY): Payer: Medicare Other | Admitting: Anesthesiology

## 2015-09-02 ENCOUNTER — Encounter (HOSPITAL_COMMUNITY): Payer: Self-pay | Admitting: Critical Care Medicine

## 2015-09-02 HISTORY — PX: CRANIOTOMY: SHX93

## 2015-09-02 HISTORY — PX: APPLICATION OF CRANIAL NAVIGATION: SHX6578

## 2015-09-02 SURGERY — CRANIOTOMY TUMOR EXCISION
Anesthesia: General | Site: Head | Laterality: Right

## 2015-09-02 MED ORDER — PROMETHAZINE HCL 25 MG/ML IJ SOLN: 6.2500 mg | INTRAMUSCULAR | Status: DC | PRN

## 2015-09-02 MED ORDER — CEFAZOLIN SODIUM 1-5 GM-% IV SOLN: 1.0000 g | Freq: Three times a day (TID) | INTRAVENOUS | Status: DC

## 2015-09-02 MED ORDER — SODIUM CHLORIDE 0.9 % IV SOLN
1000.0000 mg | INTRAVENOUS | Status: AC
  Administered 2015-09-02: 1000 mg via INTRAVENOUS
  Filled 2015-09-02: qty 10

## 2015-09-02 MED ORDER — SODIUM CHLORIDE 0.9 % IV SOLN
INTRAVENOUS | Status: DC | PRN
  Administered 2015-09-02: 10:00:00 via INTRAVENOUS

## 2015-09-02 MED ORDER — PROPOFOL 10 MG/ML IV BOLUS
INTRAVENOUS | Status: AC
  Filled 2015-09-02: qty 40

## 2015-09-02 MED ORDER — THROMBIN 5000 UNITS EX SOLR
OROMUCOSAL | Status: DC | PRN
  Administered 2015-09-02: 10 mL via TOPICAL

## 2015-09-02 MED ORDER — ESMOLOL HCL 100 MG/10ML IV SOLN
INTRAVENOUS | Status: DC | PRN
  Administered 2015-09-02: 30 mg via INTRAVENOUS
  Administered 2015-09-02 (×3): 20 mg via INTRAVENOUS
  Administered 2015-09-02: 30 mg via INTRAVENOUS
  Administered 2015-09-02 (×2): 20 mg via INTRAVENOUS

## 2015-09-02 MED ORDER — HYDROMORPHONE HCL 1 MG/ML IJ SOLN: 0.2500 mg | INTRAMUSCULAR | Status: DC | PRN

## 2015-09-02 MED ORDER — ARTIFICIAL TEARS OP OINT
TOPICAL_OINTMENT | OPHTHALMIC | Status: DC | PRN
  Administered 2015-09-02: 1 via OPHTHALMIC

## 2015-09-02 MED ORDER — ROCURONIUM BROMIDE 50 MG/5ML IV SOLN
INTRAVENOUS | Status: AC
  Filled 2015-09-02: qty 1

## 2015-09-02 MED ORDER — LIDOCAINE HCL (CARDIAC) 20 MG/ML IV SOLN
INTRAVENOUS | Status: DC | PRN
  Administered 2015-09-02: 60 mg via INTRAVENOUS

## 2015-09-02 MED ORDER — ENSURE ENLIVE PO LIQD
237.0000 mL | Freq: Two times a day (BID) | ORAL | Status: DC
  Administered 2015-09-04 – 2015-09-07 (×7): 237 mL via ORAL
  Filled 2015-09-02 (×12): qty 237

## 2015-09-02 MED ORDER — BUPIVACAINE HCL (PF) 0.5 % IJ SOLN
INTRAMUSCULAR | Status: DC | PRN
  Administered 2015-09-02: 5 mL

## 2015-09-02 MED ORDER — ESMOLOL HCL 100 MG/10ML IV SOLN
INTRAVENOUS | Status: AC
  Filled 2015-09-02: qty 10

## 2015-09-02 MED ORDER — ONDANSETRON HCL 4 MG/2ML IJ SOLN
INTRAMUSCULAR | Status: DC | PRN
  Administered 2015-09-02: 4 mg via INTRAVENOUS

## 2015-09-02 MED ORDER — SURGIFOAM 100 EX MISC
CUTANEOUS | Status: DC | PRN
  Administered 2015-09-02: 20 mL via TOPICAL

## 2015-09-02 MED ORDER — SODIUM CHLORIDE 0.9 % IV SOLN
500.0000 mg | Freq: Two times a day (BID) | INTRAVENOUS | Status: DC
  Administered 2015-09-02 – 2015-09-03 (×3): 500 mg via INTRAVENOUS
  Filled 2015-09-02 (×4): qty 5

## 2015-09-02 MED ORDER — LIDOCAINE-EPINEPHRINE 1 %-1:100000 IJ SOLN
INTRAMUSCULAR | Status: DC | PRN
  Administered 2015-09-02: 5 mL

## 2015-09-02 MED ORDER — PANTOPRAZOLE SODIUM 40 MG IV SOLR
40.0000 mg | Freq: Every day | INTRAVENOUS | Status: DC
  Administered 2015-09-02: 40 mg via INTRAVENOUS
  Filled 2015-09-02: qty 40

## 2015-09-02 MED ORDER — VECURONIUM BROMIDE 10 MG IV SOLR
INTRAVENOUS | Status: AC
  Filled 2015-09-02: qty 10

## 2015-09-02 MED ORDER — ARTIFICIAL TEARS OP OINT
TOPICAL_OINTMENT | OPHTHALMIC | Status: AC
  Filled 2015-09-02: qty 3.5

## 2015-09-02 MED ORDER — LABETALOL HCL 5 MG/ML IV SOLN: 10.0000 mg | INTRAVENOUS | Status: DC | PRN

## 2015-09-02 MED ORDER — VANCOMYCIN HCL IN DEXTROSE 1-5 GM/200ML-% IV SOLN
INTRAVENOUS | Status: AC
  Administered 2015-09-02: 1000 mg via INTRAVENOUS
  Filled 2015-09-02: qty 200

## 2015-09-02 MED ORDER — PHENYLEPHRINE HCL 10 MG/ML IJ SOLN
10.0000 mg | INTRAMUSCULAR | Status: DC | PRN
  Administered 2015-09-02: 20 ug/min via INTRAVENOUS

## 2015-09-02 MED ORDER — GLYCOPYRROLATE 0.2 MG/ML IJ SOLN
INTRAMUSCULAR | Status: AC
  Filled 2015-09-02: qty 3

## 2015-09-02 MED ORDER — LABETALOL HCL 5 MG/ML IV SOLN
INTRAVENOUS | Status: DC | PRN
  Administered 2015-09-02: 5 mg via INTRAVENOUS

## 2015-09-02 MED ORDER — NEOSTIGMINE METHYLSULFATE 10 MG/10ML IV SOLN
INTRAVENOUS | Status: DC | PRN
  Administered 2015-09-02: 4 mg via INTRAVENOUS

## 2015-09-02 MED ORDER — PROPOFOL 10 MG/ML IV BOLUS
INTRAVENOUS | Status: DC | PRN
  Administered 2015-09-02: 30 mg via INTRAVENOUS
  Administered 2015-09-02: 50 mg via INTRAVENOUS
  Administered 2015-09-02: 120 mg via INTRAVENOUS
  Administered 2015-09-02: 100 mg via INTRAVENOUS

## 2015-09-02 MED ORDER — STERILE WATER FOR INJECTION IJ SOLN
INTRAMUSCULAR | Status: AC
  Filled 2015-09-02: qty 10

## 2015-09-02 MED ORDER — ROCURONIUM BROMIDE 100 MG/10ML IV SOLN
INTRAVENOUS | Status: DC | PRN
  Administered 2015-09-02: 15 mg via INTRAVENOUS
  Administered 2015-09-02: 35 mg via INTRAVENOUS

## 2015-09-02 MED ORDER — VANCOMYCIN HCL IN DEXTROSE 1-5 GM/200ML-% IV SOLN
1000.0000 mg | Freq: Once | INTRAVENOUS | Status: AC
  Administered 2015-09-02: 1000 mg via INTRAVENOUS
  Filled 2015-09-02 (×2): qty 200

## 2015-09-02 MED ORDER — ONDANSETRON HCL 4 MG/2ML IJ SOLN
INTRAMUSCULAR | Status: AC
  Filled 2015-09-02: qty 4

## 2015-09-02 MED ORDER — LABETALOL HCL 5 MG/ML IV SOLN
INTRAVENOUS | Status: AC
  Filled 2015-09-02: qty 4

## 2015-09-02 MED ORDER — LIDOCAINE HCL (CARDIAC) 20 MG/ML IV SOLN
INTRAVENOUS | Status: AC
  Filled 2015-09-02: qty 5

## 2015-09-02 MED ORDER — DEXAMETHASONE SODIUM PHOSPHATE 10 MG/ML IJ SOLN
INTRAMUSCULAR | Status: DC | PRN
  Administered 2015-09-02: 10 mg via INTRAVENOUS

## 2015-09-02 MED ORDER — 0.9 % SODIUM CHLORIDE (POUR BTL) OPTIME
TOPICAL | Status: DC | PRN
  Administered 2015-09-02 (×2): 1000 mL

## 2015-09-02 MED ORDER — FENTANYL CITRATE (PF) 250 MCG/5ML IJ SOLN
INTRAMUSCULAR | Status: AC
  Filled 2015-09-02: qty 5

## 2015-09-02 MED ORDER — BACITRACIN ZINC 500 UNIT/GM EX OINT
TOPICAL_OINTMENT | CUTANEOUS | Status: DC | PRN
  Administered 2015-09-02: 1 via TOPICAL

## 2015-09-02 MED ORDER — FENTANYL CITRATE (PF) 250 MCG/5ML IJ SOLN
INTRAMUSCULAR | Status: DC | PRN
  Administered 2015-09-02 (×2): 50 ug via INTRAVENOUS

## 2015-09-02 MED ORDER — GLYCOPYRROLATE 0.2 MG/ML IJ SOLN
INTRAMUSCULAR | Status: DC | PRN
  Administered 2015-09-02: 0.6 mg via INTRAVENOUS
  Administered 2015-09-02: 0.1 mg via INTRAVENOUS

## 2015-09-02 MED ORDER — VECURONIUM BROMIDE 10 MG IV SOLR
INTRAVENOUS | Status: DC | PRN
  Administered 2015-09-02: 1 mg via INTRAVENOUS

## 2015-09-02 SURGICAL SUPPLY — 107 items
APL SKNCLS STERI-STRIP NONHPOA (GAUZE/BANDAGES/DRESSINGS)
BANDAGE GAUZE 4  KLING STR (GAUZE/BANDAGES/DRESSINGS) IMPLANT
BATTERY IQ STERILE (MISCELLANEOUS) ×2 IMPLANT
BENZOIN TINCTURE PRP APPL 2/3 (GAUZE/BANDAGES/DRESSINGS) IMPLANT
BLADE CLIPPER SURG (BLADE) ×4 IMPLANT
BLADE SAW GIGLI 16 STRL (MISCELLANEOUS) IMPLANT
BLADE SURG 15 STRL LF DISP TIS (BLADE) IMPLANT
BLADE SURG 15 STRL SS (BLADE)
BLADE ULTRA TIP 2M (BLADE) ×4 IMPLANT
BNDG GAUZE ELAST 4 BULKY (GAUZE/BANDAGES/DRESSINGS) IMPLANT
BRUSH SCRUB EZ 1% IODOPHOR (MISCELLANEOUS) ×4 IMPLANT
BTRY SRG DRVR 1.5 IQ (MISCELLANEOUS) ×2
BUR ACORN 6.0 PRECISION (BURR) ×3 IMPLANT
BUR ACORN 6.0MM PRECISION (BURR) ×1
BUR ADDG 1.1 (BURR) IMPLANT
BUR ADDG 1.1MM (BURR)
BUR MATCHSTICK NEURO 3.0 LAGG (BURR) IMPLANT
BUR ROUND FLUTED 4 SOFT TCH (BURR) IMPLANT
BUR ROUND FLUTED 4MM SOFT TCH (BURR)
BUR SPIRAL ROUTER 2.3 (BUR) IMPLANT
BUR SPIRAL ROUTER 2.3MM (BUR)
CANISTER SUCT 3000ML PPV (MISCELLANEOUS) ×4 IMPLANT
CATH VENTRIC 35X38 W/TROCAR LG (CATHETERS) IMPLANT
CLIP TI MEDIUM 6 (CLIP) IMPLANT
CONT SPEC 4OZ CLIKSEAL STRL BL (MISCELLANEOUS) ×4 IMPLANT
COVER MAYO STAND STRL (DRAPES) IMPLANT
DECANTER SPIKE VIAL GLASS SM (MISCELLANEOUS) ×4 IMPLANT
DRAIN SNY WOU 7FLT (WOUND CARE) IMPLANT
DRAIN SUBARACHNOID (WOUND CARE) IMPLANT
DRAPE MICROSCOPE LEICA (MISCELLANEOUS) IMPLANT
DRAPE NEUROLOGICAL W/INCISE (DRAPES) ×4 IMPLANT
DRAPE PROXIMA HALF (DRAPES) ×4 IMPLANT
DRAPE STERI IOBAN 125X83 (DRAPES) IMPLANT
DRAPE SURG 17X23 STRL (DRAPES) IMPLANT
DRAPE WARM FLUID 44X44 (DRAPE) ×4 IMPLANT
DRSG ADAPTIC 3X8 NADH LF (GAUZE/BANDAGES/DRESSINGS) IMPLANT
DRSG TELFA 3X8 NADH (GAUZE/BANDAGES/DRESSINGS) IMPLANT
DURAMATRIX ONLAY 3X3 (Plate) ×2 IMPLANT
DURAPREP 6ML APPLICATOR 50/CS (WOUND CARE) ×4 IMPLANT
ELECT REM PT RETURN 9FT ADLT (ELECTROSURGICAL) ×4
ELECTRODE REM PT RTRN 9FT ADLT (ELECTROSURGICAL) ×2 IMPLANT
EVACUATOR 1/8 PVC DRAIN (DRAIN) IMPLANT
EVACUATOR SILICONE 100CC (DRAIN) IMPLANT
FORCEPS BIPOLAR SPETZLER 8 1.0 (NEUROSURGERY SUPPLIES) ×2 IMPLANT
GAUZE SPONGE 4X4 12PLY STRL (GAUZE/BANDAGES/DRESSINGS) ×4 IMPLANT
GAUZE SPONGE 4X4 16PLY XRAY LF (GAUZE/BANDAGES/DRESSINGS) IMPLANT
GLOVE BIOGEL PI IND STRL 7.5 (GLOVE) ×2 IMPLANT
GLOVE BIOGEL PI INDICATOR 7.5 (GLOVE) ×2
GLOVE ECLIPSE 6.5 STRL STRAW (GLOVE) ×4 IMPLANT
GLOVE ECLIPSE 7.0 STRL STRAW (GLOVE) IMPLANT
GLOVE EXAM NITRILE LRG STRL (GLOVE) IMPLANT
GLOVE EXAM NITRILE MD LF STRL (GLOVE) IMPLANT
GLOVE EXAM NITRILE XL STR (GLOVE) IMPLANT
GLOVE EXAM NITRILE XS STR PU (GLOVE) IMPLANT
GOWN STRL REUS W/ TWL LRG LVL3 (GOWN DISPOSABLE) ×4 IMPLANT
GOWN STRL REUS W/ TWL XL LVL3 (GOWN DISPOSABLE) IMPLANT
GOWN STRL REUS W/TWL 2XL LVL3 (GOWN DISPOSABLE) IMPLANT
GOWN STRL REUS W/TWL LRG LVL3 (GOWN DISPOSABLE) ×8
GOWN STRL REUS W/TWL XL LVL3 (GOWN DISPOSABLE)
HEMOSTAT POWDER KIT SURGIFOAM (HEMOSTASIS) ×4 IMPLANT
HEMOSTAT SURGICEL 2X14 (HEMOSTASIS) IMPLANT
KIT BASIN OR (CUSTOM PROCEDURE TRAY) ×4 IMPLANT
KIT DRAIN CSF ACCUDRAIN (MISCELLANEOUS) IMPLANT
KIT ROOM TURNOVER OR (KITS) ×4 IMPLANT
KNIFE ARACHNOID DISP AM-24-S (MISCELLANEOUS) ×4 IMPLANT
MARKER SPHERE PSV REFLC 13MM (MARKER) ×20 IMPLANT
NDL HYPO 25X1 1.5 SAFETY (NEEDLE) ×2 IMPLANT
NDL SPNL 18GX3.5 QUINCKE PK (NEEDLE) IMPLANT
NEEDLE HYPO 25X1 1.5 SAFETY (NEEDLE) ×4 IMPLANT
NEEDLE SPNL 18GX3.5 QUINCKE PK (NEEDLE) IMPLANT
NS IRRIG 1000ML POUR BTL (IV SOLUTION) ×4 IMPLANT
PACK CRANIOTOMY (CUSTOM PROCEDURE TRAY) ×4 IMPLANT
PAD DRESSING TELFA 3X8 NADH (GAUZE/BANDAGES/DRESSINGS) IMPLANT
PAD EYE OVAL STERILE LF (GAUZE/BANDAGES/DRESSINGS) IMPLANT
PATTIES SURGICAL .25X.25 (GAUZE/BANDAGES/DRESSINGS) IMPLANT
PATTIES SURGICAL .5 X.5 (GAUZE/BANDAGES/DRESSINGS) IMPLANT
PATTIES SURGICAL .5 X3 (DISPOSABLE) IMPLANT
PATTIES SURGICAL 1/4 X 3 (GAUZE/BANDAGES/DRESSINGS) IMPLANT
PATTIES SURGICAL 1X1 (DISPOSABLE) IMPLANT
PIN MAYFIELD SKULL DISP (PIN) ×4 IMPLANT
PLATE 1.5 2H 17 DOU T (Plate) ×4 IMPLANT
PLATE 1.5/0.5 18.5MM BURR HOLE (Plate) ×4 IMPLANT
RUBBERBAND STERILE (MISCELLANEOUS) IMPLANT
SCREW SELF DRILL HT 1.5/4MM (Screw) ×28 IMPLANT
SET TUBING W/EXT DISP (INSTRUMENTS) ×2 IMPLANT
SPECIMEN JAR SMALL (MISCELLANEOUS) IMPLANT
SPONGE NEURO XRAY DETECT 1X3 (DISPOSABLE) IMPLANT
SPONGE SURGIFOAM ABS GEL 100 (HEMOSTASIS) ×4 IMPLANT
STAPLER VISISTAT 35W (STAPLE) ×4 IMPLANT
STOCKINETTE 6  STRL (DRAPES) ×2
STOCKINETTE 6 STRL (DRAPES) ×2 IMPLANT
SUT ETHILON 3 0 FSL (SUTURE) IMPLANT
SUT ETHILON 3 0 PS 1 (SUTURE) IMPLANT
SUT NURALON 4 0 TR CR/8 (SUTURE) ×12 IMPLANT
SUT SILK 0 TIES 10X30 (SUTURE) IMPLANT
SUT VIC AB 0 CT1 18XCR BRD8 (SUTURE) ×4 IMPLANT
SUT VIC AB 0 CT1 8-18 (SUTURE) ×8
SUT VIC AB 3-0 SH 8-18 (SUTURE) ×8 IMPLANT
TIP SONASTAR STD MISONIX 1.9 (TRAY / TRAY PROCEDURE) IMPLANT
TIP STRAIGHT 25KHZ (INSTRUMENTS) ×2 IMPLANT
TOWEL OR 17X24 6PK STRL BLUE (TOWEL DISPOSABLE) ×4 IMPLANT
TOWEL OR 17X26 10 PK STRL BLUE (TOWEL DISPOSABLE) ×4 IMPLANT
TRAY FOLEY W/METER SILVER 14FR (SET/KITS/TRAYS/PACK) ×4 IMPLANT
TUBE CONNECTING 12'X1/4 (SUCTIONS) ×1
TUBE CONNECTING 12X1/4 (SUCTIONS) ×3 IMPLANT
UNDERPAD 30X30 INCONTINENT (UNDERPADS AND DIAPERS) ×4 IMPLANT
WATER STERILE IRR 1000ML POUR (IV SOLUTION) ×4 IMPLANT

## 2015-09-02 NOTE — Progress Notes (Addendum)
Initial Nutrition Assessment  DOCUMENTATION CODES:   Not applicable  INTERVENTION:  Ensure Enlive po BID, each supplement provides 350 kcal and 20 grams of protein  NUTRITION DIAGNOSIS:   Increased nutrient needs related to cancer and cancer related treatments as evidenced by estimated needs.  GOAL:   Patient will meet greater than or equal to 90% of their needs  MONITOR:   PO intake, Supplement acceptance, I & O's, Labs  REASON FOR ASSESSMENT:   Malnutrition Screening Tool    ASSESSMENT:   Mrs. Delauder is a 74 year old woman I'm seeing with a history of metastatic non-small cell lung cancer, and was treated with stereotactic radiosurgery to 4 brain metastases back in October 2015. She has undergone routine surveillance MRIs since then. Unfortunately, over the last few months, MRI scans have demonstrated progressive enlargement of the right frontal enhancing lesion  Spoke with pt at bedside.  Exhibits confusion but could answer some questions.  Pt reports she has been dropping weight, could not give an exact amount, or time span. Per chart, pt wt is stable x1 month.  States her appetite has been poor lately - related to cancer and related treatments.  Underwent right frontal lesion resection earlier today..will provide ensure during stay for healing. Pt reports she drinks chocolate at home.  Nutrition-Focused physical exam completed. Findings are mild fat depletion, moderate muscle depletion, and moderate edema.   Labs and medications reviewed.  Diet Order:  Diet regular Room service appropriate?: Yes; Fluid consistency:: Thin  Skin:  Wound (see comment) (incision to right head.)  Last BM:  09/01/2015  Height:   Ht Readings from Last 1 Encounters:  09/02/15 '5\' 5"'$  (1.651 m)    Weight:   Wt Readings from Last 1 Encounters:  09/02/15 121 lb 7.6 oz (55.1 kg)    Ideal Body Weight:  56.81 kg  BMI:  Body mass index is 20.21 kg/(m^2).  Estimated Nutritional  Needs:   Kcal:  1900-2200 calories  Protein:  70-95 grams  Fluid:  >/= 1.9L  EDUCATION NEEDS:   No education needs identified at this time  Satira Anis. Caffie Sotto, MS, RD LDN After Hours/Weekend Pager [phone]

## 2015-09-02 NOTE — Anesthesia Procedure Notes (Signed)
Procedure Name: Intubation Date/Time: 09/02/2015 10:34 AM Performed by: Merrilyn Puma B Pre-anesthesia Checklist: Patient identified, Timeout performed, Emergency Drugs available, Patient being monitored and Suction available Patient Re-evaluated:Patient Re-evaluated prior to inductionOxygen Delivery Method: Circle system utilized Preoxygenation: Pre-oxygenation with 100% oxygen Intubation Type: IV induction Ventilation: Mask ventilation without difficulty Laryngoscope Size: Mac and 3 Grade View: Grade IV Tube type: Oral Number of attempts: 2 Airway Equipment and Method: Stylet Placement Confirmation: CO2 detector,  positive ETCO2,  ETT inserted through vocal cords under direct vision and breath sounds checked- equal and bilateral Secured at: 21 cm Tube secured with: Tape Dental Injury: Teeth and Oropharynx as per pre-operative assessment  Difficulty Due To: Difficult Airway- due to limited oral opening Comments: Easy mask ventilation.  Grade 4 view with MAC 3 and cricoid pressure.  Blind intubation by Dr. Orene Desanctis.

## 2015-09-02 NOTE — Op Note (Signed)
PREOP DIAGNOSIS:  1. Right frontal tumor   POSTOP DIAGNOSIS: Same  PROCEDURE: 1. Stereotactic right frontal craniotomy for resection of tumor 2. Use of intraoperative microscope for microdissection  SURGEON: Dr. Consuella Lose, MD  ASSISTANT: Dr. Erline Levine, MD  ANESTHESIA: General Endotracheal  EBL: 50cc  SPECIMENS: right frontal tumor for permanent pathology  DRAINS: None  COMPLICATIONS: None immediate  CONDITION: Hemodynamically stable to PACU  HISTORY: Tamara Ortiz is a 74 y.o. female with a history of metastatic lung CA with multiple brain mets treated with SRS 6 months ago. The right frontal lesion was found to be growing with increasing surrounding edema. Treatment options were discussed, and she elected to proceed with surgical resection.   PROCEDURE IN DETAIL: After informed consent was obtained and witnessed, the patient was brought to the operating room. After induction of general anesthesia, the patient was placed in the [place] headholder, and positioned on the operative table in the supine position. All pressure points were meticulously padded. Utilizing the preoperative stereotactic MRI scan, surface markers were co-registered until satisfactory accuracy was achieved. Skin incision was then planned out to create a craniotomy overlying the right frontal tumor. Skin incision was then marked out and prepped and draped in the usual sterile fashion.  After timeout was conducted, skin incision was infiltrated with local anesthetic with epinephrine. Incision was then made sharply, and Bovie electrocautery was used to dissect through subcutaneous talus tissue, galea, and the temporalis fascia. A single piece my cutaneous flap was then elevated and reflected anteriorly. The coronal suture was easily identified, and the stereotactic system was used to identify the surface projection of the right frontal tumor. Craniotomy was then created overlying this tumor. Hemostasis was  achieved on the epidural surface using a combination of bipolar electrocautery and morcellized Gelfoam with thrombin.  The dura was then opened in cruciate fashion. Microscope was then draped sterilely and brought into the field, and the remainder of the case was done under the microscope using microdissection. The stereotactic system was used to identify the gyrus containing the right frontal tumor. The pia was then coagulated, and using a combination of suction and dissectors, the tumor was identified being slightly more firm than the surrounding white matter. It was easily dissected using bipolar from the surrounding white matter. This was removed and sent for permanent pathology en bloc.  Margins of the resection cavity were then inspected, and no further tumor was seen. Hemostasis was then achieved using a combination of bipolar electrocautery and morcellized Gelfoam with thrombin. The wound is then irrigated with copious amounts of normal saline irrigation. The dura was reapproximated with interrupted 4-0 Nurolon stitches. A piece of dura matrix onlay graft was then placed, the bone was replaced with standard titanium plates and screws, and the galea was closed with interrupted 0 and 3-0 Vicryl stitches. Skin was closed with skin staples.  The [place] head holder was then removed, sterile dressing was applied, the patient was then extubated, transferred to the stretcher, and taken to postanesthesia care unit in stable hemodynamic condition. At the end of the case all sponge, needle, instrument, and cottonoid counts were correct.

## 2015-09-02 NOTE — Progress Notes (Signed)
Cindee Lame RN states she took belonging out to pt's son.

## 2015-09-02 NOTE — Progress Notes (Signed)
No issues overnight. Pt doing well. Ready to proceed with surgery  EXAM:  BP 113/76 mmHg  Pulse 65  Temp(Src) 98.3 F (36.8 C) (Oral)  Resp 18  Ht '5\' 5"'$  (1.651 m)  Wt 57.108 kg (125 lb 14.4 oz)  BMI 20.95 kg/m2  SpO2 100%  Awake, alert, oriented  CN grossly intact  5/5 BUE/BLE   IMPRESSION:  74 y.o. female with enlarging right frontal lesion after SRS treatment for metastatic breast CA.  PLAN: - Proceed with stereotactic right frontal craniotomy for resection

## 2015-09-02 NOTE — Anesthesia Preprocedure Evaluation (Addendum)
Anesthesia Evaluation  Patient identified by MRN, date of birth, ID band Patient awake    Reviewed: Allergy & Precautions, NPO status , Patient's Chart, lab work & pertinent test results  History of Anesthesia Complications Negative for: history of anesthetic complications  Airway Mallampati: II  TM Distance: >3 FB Neck ROM: Full    Dental  (+) Teeth Intact   Pulmonary  Lung ca c mets to bone and brain   breath sounds clear to auscultation       Cardiovascular hypertension,  Rhythm:Regular Rate:Normal     Neuro/Psych  Neuromuscular disease    GI/Hepatic negative GI ROS, Neg liver ROS,   Endo/Other  negative endocrine ROS  Renal/GU Renal InsufficiencyRenal diseasenegative Renal ROS     Musculoskeletal  (+) Arthritis ,   Abdominal   Peds  Hematology  (+) anemia ,   Anesthesia Other Findings   Reproductive/Obstetrics                            Anesthesia Physical Anesthesia Plan  ASA: IV  Anesthesia Plan: General   Post-op Pain Management:    Induction: Intravenous  Airway Management Planned: Oral ETT  Additional Equipment: Arterial line  Intra-op Plan:   Post-operative Plan: Possible Post-op intubation/ventilation and Extubation in OR  Informed Consent: I have reviewed the patients History and Physical, chart, labs and discussed the procedure including the risks, benefits and alternatives for the proposed anesthesia with the patient or authorized representative who has indicated his/her understanding and acceptance.     Plan Discussed with:   Anesthesia Plan Comments:         Anesthesia Quick Evaluation

## 2015-09-02 NOTE — Anesthesia Postprocedure Evaluation (Signed)
Anesthesia Post Note  Patient: Tamara Ortiz  Procedure(s) Performed: Procedure(s) (LRB): CRANIOTOMY TUMOR EXCISION w/Curve (Right) APPLICATION OF CRANIAL NAVIGATION (N/A)  Patient location during evaluation: PACU Anesthesia Type: General Level of consciousness: awake and alert and confused Pain management: pain level controlled Vital Signs Assessment: post-procedure vital signs reviewed and stable Respiratory status: spontaneous breathing and patient connected to nasal cannula oxygen Cardiovascular status: blood pressure returned to baseline Postop Assessment: no signs of nausea or vomiting Anesthetic complications: no    Last Vitals:  Filed Vitals:   09/02/15 1250 09/02/15 1304  BP: 154/92 160/82  Pulse: 70 76  Temp: 36.2 C   Resp: 15 19    Last Pain:  Filed Vitals:   09/02/15 1319  PainSc: 0-No pain    LLE Motor Response:  (wiggles toes on command) LLE Sensation:  (uta) RLE Motor Response:  (wiggles toes on command) RLE Sensation:  (uta)      Glema Takaki,JAMES TERRILL

## 2015-09-02 NOTE — Transfer of Care (Signed)
Immediate Anesthesia Transfer of Care Note  Patient: Tamara Ortiz  Procedure(s) Performed: Procedure(s) with comments: CRANIOTOMY TUMOR EXCISION w/Curve (Right) - Right frontal stereotactic craniotomy for resection of tumor with brainlab APPLICATION OF CRANIAL NAVIGATION (N/A) - APPLICATION OF CRANIAL NAVIGATION  Patient Location: PACU  Anesthesia Type:General  Level of Consciousness: sedated  Airway & Oxygen Therapy: Patient Spontanous Breathing and Patient connected to nasal cannula oxygen  Post-op Assessment: Report given to RN, Post -op Vital signs reviewed and stable and Patient moving all extremities X 4  Post vital signs: Reviewed and stable  Last Vitals:  Filed Vitals:   09/02/15 0108 09/02/15 0605  BP: 130/74 113/76  Pulse: 53 65  Temp: 36.9 C 36.8 C  Resp: 18 18    Complications: No apparent anesthesia complications

## 2015-09-02 NOTE — Care Management Note (Signed)
Case Management Note  Patient Details  Name: Tamara Ortiz MRN: [id number] Date of Birth: 02/22/1941  Subjective/Objective:                    Action/Plan:   Expected Discharge Date:                  Expected Discharge Plan:  [place]  In-House Referral:     Discharge planning Services  CM Consult  Post Acute Care Choice:    Choice offered to:     DME Arranged:    DME Agency:     HH Arranged:    HH Agency:     Status of Service:  In process, will continue to follow  Medicare Important Message Given:    Date Medicare IM Given:    Medicare IM give by:    Date Additional Medicare IM Given:    Additional Medicare Important Message give by:     If discussed at [place] of Stay Meetings, dates discussed:    Additional Comments:  Ella Bodo, RN 09/02/2015, 1:49 PM

## 2015-09-02 NOTE — Progress Notes (Signed)
Dr.Nundkumar at bedside and aware of all assessment findings. No new orders. Will cont to monitor.

## 2015-09-03 ENCOUNTER — Encounter (HOSPITAL_COMMUNITY): Payer: Self-pay | Admitting: Neurosurgery

## 2015-09-03 ENCOUNTER — Ambulatory Visit (HOSPITAL_COMMUNITY): Payer: Medicare Other

## 2015-09-03 ENCOUNTER — Inpatient Hospital Stay (HOSPITAL_COMMUNITY): Payer: Medicare Other

## 2015-09-03 MED ORDER — GADOBENATE DIMEGLUMINE 529 MG/ML IV SOLN
10.0000 mL | Freq: Once | INTRAVENOUS | Status: AC | PRN
Start: 2015-09-03 — End: 2015-09-03
  Administered 2015-09-03: 10 mL via INTRAVENOUS

## 2015-09-03 MED ORDER — LEVETIRACETAM 500 MG PO TABS
500.0000 mg | ORAL_TABLET | Freq: Two times a day (BID) | ORAL | Status: DC
  Administered 2015-09-03 – 2015-09-08 (×10): 500 mg via ORAL
  Filled 2015-09-03 (×10): qty 1

## 2015-09-03 MED ORDER — PANTOPRAZOLE SODIUM 40 MG PO TBEC
40.0000 mg | DELAYED_RELEASE_TABLET | Freq: Every day | ORAL | Status: DC
  Administered 2015-09-03 – 2015-09-07 (×5): 40 mg via ORAL
  Filled 2015-09-03 (×5): qty 1

## 2015-09-03 NOTE — Progress Notes (Signed)
Pt seen and examined. No issues overnight. Pt has no complaints this am. Minimal HA.  EXAM: Temp:  [97 F (36.1 C)-98.3 F (36.8 C)] 98.3 F (36.8 C) (12/02 0400) Pulse Rate:  [51-101] 54 (12/02 0500) Resp:  [11-23] 16 (12/02 0600) BP: (95-160)/(57-105) 119/73 mmHg (12/02 0600) SpO2:  [95 %-100 %] 96 % (12/02 0500) Arterial Line BP: (115-180)/(52-87) 136/52 mmHg (12/02 0600) Weight:  [55.1 kg (121 lb 7.6 oz)] 55.1 kg (121 lb 7.6 oz) (12/01 1346) Intake/Output      12/01 0701 - 12/02 0700 12/02 0701 - 12/03 0700   P.O. 560    I.V. (mL/kg) 1575 (28.6)    IV Piggyback 410    Total Intake(mL/kg) 2545 (46.2)    Urine (mL/kg/hr) 1665 (1.3)    Total Output 1665     Net +880           Awake, alert, oriented Speech slightly broken, at baseline Good strength  LABS: Lab Results  Component Value Date   CREATININE 1.20* 09/01/2015   BUN 19 08/20/2015   NA 135 08/20/2015   K 4.0 08/20/2015   CL 102 08/20/2015   CO2 25 08/20/2015   Lab Results  Component Value Date   WBC 4.3 08/20/2015   HGB 11.2* 08/20/2015   HCT 33.6* 08/20/2015   MCV 98.5 08/20/2015   PLT 133* 08/20/2015    IMPRESSION: - 74 y.o. female POD#1 s/p resection of right frontal tumor, doing well  PLAN: - Transfer to floor - MRI today - OOB

## 2015-09-03 NOTE — Care Management Note (Signed)
Case Management Note  Patient Details  Name: Tamara Ortiz MRN: [id number] Date of Birth: Feb 18, 1941  Subjective/Objective:    Pt admitted on 09/01/15 s/p Rt frontal craniotomy for resection of tumor.  PTA, pt resides at home alone.  She states she does not drive; relies on friends/neighbors to assist with getting groceries, running errands.  Pt states she has used home health services in the past.   She states she has a shower chair and BSC for home use.              Action/Plan: PT/OT consults pending.   Will follow for recommendations.  Pt will likely need HH follow up at dc, and she is agreeable to this if needed.  MD: please leave orders for [facility] follow up at dc, based on PT/OT recommendations.  Would add HHRN for medication mgmt and [place] aide.  Thank you.    Expected Discharge Date:                  Expected Discharge Plan:  [place]  In-House Referral:     Discharge planning Services  CM Consult  Post Acute Care Choice:    Choice offered to:     DME Arranged:    DME Agency:     HH Arranged:    [place] Agency:     Status of Service:  In process, will continue to follow  Medicare Important Message Given:    Date Medicare IM Given:    Medicare IM give by:    Date Additional Medicare IM Given:    Additional Medicare Important Message give by:     If discussed at [place] of Stay Meetings, dates discussed:    Additional Comments:  Reinaldo Raddle, RN, BSN  Trauma/Neuro ICU Case Manager [phone]

## 2015-09-03 NOTE — Evaluation (Signed)
Physical Therapy Evaluation Patient Details Name: Emmett Arntz MRN: [id number] DOB: 1941-01-03 Today's Date: 09/03/2015   History of Present Illness  Mrs. Stambaugh is a 74 year old woman I'm seeing with a history of metastatic non-small cell lung cancer, and was treated with stereotactic radiosurgery to 4 brain metastases back in October 2015.  Patient with R frontal lesion now s/p craniectomy   Clinical Impression  Patient presents with decreased mobility due to deficits listed below.  She will benefit from skilled PT in the acute setting to allow return home with intermittent support (though states one friend has offered to stay with her temporarily,) following CIR level rehab stay.     Follow Up Recommendations CIR    Equipment Recommendations  Rolling walker with 5" wheels    Recommendations for Other Services Rehab consult;OT consult     Precautions / Restrictions Precautions Precautions: Fall      Mobility  Bed Mobility Overal bed mobility: Needs Assistance Bed Mobility: Supine to Sit;Sit to Supine     Supine to sit: Supervision Sit to supine: Supervision   General bed mobility comments: cues for technique, assist for safety, positioning in bed  Transfers Overall transfer level: Needs assistance Equipment used: Rolling walker (2 wheeled);1 person hand held assist Transfers: Sit to/from Stand Sit to Stand: Min assist         General transfer comment: posterior lean, poor safety stand to sit, sits uncontrolled  Ambulation/Gait Ambulation/Gait assistance: Mod assist;Min assist;Supervision;Min guard Ambulation Distance (Feet): 120 Feet Assistive device: None;Rolling walker (2 wheeled) Gait Pattern/deviations: Step-to pattern;Shuffle;Decreased stride length     General Gait Details: posterior lean with shuffling gait, pt reports was shuffling previously, assist of walker and cues able to take longer steps and at times decrease support, initially needed more  assist for prevention LOB posterior  Stairs            Wheelchair Mobility    Modified Rankin (Stroke Patients Only)       Balance Overall balance assessment: Needs assistance Sitting-balance support: No upper extremity supported;Feet supported Sitting balance-Leahy Scale: Fair   Postural control: Posterior lean Standing balance support: Bilateral upper extremity supported Standing balance-Leahy Scale: Poor Standing balance comment: needs UE support for balance to prevent LOB posterior                             Pertinent Vitals/Pain Pain Assessment: No/denies pain    Home Living Family/patient expects to be discharged to:: Private residence Living Arrangements: Alone Available Help at Discharge: Friend(s) Type of Home: House Home Access: Stairs to enter Entrance Stairs-Rails: Right Entrance Stairs-Number of Steps: 3 Home Layout: One level Home Equipment: None      Prior Function Level of Independence: Independent         Comments: did not drive     Hand Dominance   Dominant Hand: Right    Extremity/Trunk Assessment               Lower Extremity Assessment: Generalized weakness      Cervical / Trunk Assessment: Kyphotic  Communication   Communication: Expressive difficulties  Cognition Arousal/Alertness: Awake/alert Behavior During Therapy: WFL for tasks assessed/performed Overall Cognitive Status: No family/caregiver present to determine baseline cognitive functioning       Memory: Decreased short-term memory              General Comments      Exercises        Assessment/Plan  PT Assessment Patient needs continued PT services  PT Diagnosis Abnormality of gait;Generalized weakness   PT Problem List Decreased strength;Decreased activity tolerance;Decreased safety awareness;Decreased knowledge of use of DME;Decreased balance;Decreased mobility  PT Treatment Interventions DME instruction;Gait  training;Balance training;Functional mobility training;Patient/family education;Cognitive remediation;Stair training;Therapeutic activities;Therapeutic exercise   PT Goals (Current goals can be found in the Care Plan section) Acute Rehab PT Goals Patient Stated Goal: To go to rehab PT Goal Formulation: With patient Time For Goal Achievement: 09/17/15 Potential to Achieve Goals: Good    Frequency Min 4X/week   Barriers to discharge        Co-evaluation               End of Session Equipment Utilized During Treatment: Gait belt Activity Tolerance: Patient limited by fatigue Patient left: in bed;with bed alarm set;with call bell/phone within reach           Time: 1545-1612 PT Time Calculation (min) (ACUTE ONLY): 27 min   Charges:   PT Evaluation $Initial PT Evaluation Tier I: 1 Procedure PT Treatments $Gait Training: 8-22 mins   PT G Codes:        WYNN,CYNDI 09/20/15, 4:29 PM  Magda Kiel, [place] 2015/09/20

## 2015-09-03 NOTE — Progress Notes (Signed)
Inpatient Rehabilitation  Patient was screened by Gunnar Fusi for appropriateness for an Inpatient Acute Rehab consult.  At this time we are recommending an Inpatient Rehab consult; however patient may progress adequately over the weekend so that the consult may not be warranted. Please order if you are agreeable.    Carmelia Roller., CCC/SLP Admission Coordinator  [place]  Cell [phone]

## 2015-09-04 NOTE — Progress Notes (Signed)
Filed Vitals:   09/03/15 2100 09/03/15 2155 09/04/15 0147 09/04/15 0539  BP: 129/69 132/87 130/68 128/68  Pulse: 68 64 68 68  Temp:  98.6 F (37 C) 98.4 F (36.9 C) 98.4 F (36.9 C)  TempSrc:  Oral Oral Oral  Resp: '21 20 18 18  '$ Height:  '5\' 5"'$  (1.651 m)    Weight:  57.335 kg (126 lb 6.4 oz)    SpO2: 99% 96% 96% 96%     Patient resting in bed. Has been working with PT, awaiting OT. PT has recommended CIR, and consultation for inpatient rehabilitation has been requested.  Nursing staff to contact OT to try and facilitate assessment this weekend. Dressing clean and dry. Awake and alert, following commands. Moving all 4 extremities.  Plan: Continue PT, await OT, consult PM& R regarding CIR  Hosie Spangle, MD 09/04/2015, 9:03 AM

## 2015-09-05 NOTE — Evaluation (Signed)
Occupational Therapy Evaluation Patient Details Name: Tamara Ortiz MRN: [id number] DOB: [birth year] Today's Date: 09/05/2015    History of Present Illness Mrs. Boyko is a 74 year old woman I'm seeing with a history of metastatic non-small cell lung cancer, and was treated with stereotactic radiosurgery to 4 brain metastases back in October 2015.  Patient with R frontal lesion now s/p craniectomy    Clinical Impression   Pt s/p above. Pt independent with ADLs, PTA. Feel pt will benefit from acute OT to increase independence and strength prior to d/c. Recommending CIR for rehab.    Follow Up Recommendations  CIR    Equipment Recommendations  Other (comment) (defer to next venue)    Recommendations for Other Services       Precautions / Restrictions Precautions Precautions: Fall Restrictions Weight Bearing Restrictions: No      Mobility Bed Mobility Overal bed mobility: Needs Assistance Bed Mobility: Supine to Sit;Sit to Supine     Supine to sit: (see comments below) Sit to supine:  (see comments below)   General bed mobility comments: pt able to return to supine at Mod I level but assist and cues given for scooting HOB. Pt able to come to sitting without assist, but then was leaning posteriorly when trying to loosen up gown-Min guard-Min assist given when leaning posteriorly. Cue to scoot to edge of bed.   Transfers Overall transfer level: Needs assistance   Transfers: Sit to/from Stand Sit to Stand: Min guard              Balance    Min assist at times with ambulation. Pt staggering at sink 1x but no physical assist required with this.                                        ADL Overall ADL's : Needs assistance/impaired     Grooming: Oral care;Wash/dry face;Set up;Supervision/safety;Standing Grooming Details (indicate cue type and reason): staggered 1x but no physical assist required              Lower Body Dressing: Min guard;Sit  to/from stand   Toilet Transfer: Minimal assistance;Ambulation (sit to stand from bed)           Functional mobility during ADLs: Minimal assistance       Vision  Reports blurry vision in right eye   Perception     Praxis      Pertinent Vitals/Pain Pain Assessment: No/denies pain     Hand Dominance Right   Extremity/Trunk Assessment Upper Extremity Assessment Upper Extremity Assessment: Generalized weakness (little slow with finger opposition on both hands)   Lower Extremity Assessment Lower Extremity Assessment: Defer to PT evaluation       Communication Communication Communication: Expressive difficulties;HOH   Cognition Arousal/Alertness: Awake/alert Behavior During Therapy: WFL for tasks assessed/performed Overall Cognitive Status:  (unsure if difficulty following commands is baseline; friend reports slow processing is baseline) Area of Impairment: Following commands;Problem solving       Following Commands: Follows one step commands inconsistently;Follows one step commands with increased time     Problem Solving: Slow processing     General Comments       Exercises       Shoulder Instructions      Home Living Family/patient expects to be discharged to:: Inpatient rehab Living Arrangements: Alone Available Help at Discharge: Friend(s) Type of Home: House Home Access: Stairs to  enter Technical brewer of Steps: 3 Entrance Stairs-Rails: Right Home Layout: One level     Bathroom Shower/Tub: Chief Strategy Officer: None          Prior Functioning/Environment Level of Independence: Independent        Comments: did not drive    OT Diagnosis: Generalized weakness   OT Problem List: Impaired vision/perception;Impaired balance (sitting and/or standing);Decreased strength;Decreased cognition;Decreased knowledge of use of DME or AE;Decreased knowledge of precautions   OT Treatment/Interventions: Self-care/ADL  training;DME and/or AE instruction;Therapeutic activities;Patient/family education;Balance training;Cognitive remediation/compensation;Visual/perceptual remediation/compensation;Therapeutic exercise    OT Goals(Current goals can be found in the care plan section) Acute Rehab OT Goals Patient Stated Goal: wants to go to rehab OT Goal Formulation: With patient Time For Goal Achievement: 09/12/15 Potential to Achieve Goals: Good ADL Goals Pt Will Perform Lower Body Dressing: with supervision;sit to/from stand (including gathering items) Pt Will Transfer to Toilet: ambulating;with supervision Pt Will Perform Toileting - Clothing Manipulation and hygiene: with supervision;sit to/from stand Additional ADL Goal #1: Pt will independently perform HEP for bilateral UEs to increase strength.  OT Frequency: Min 2X/week   Barriers to D/C:            Co-evaluation              End of Session Equipment Utilized During Treatment: Gait belt  Activity Tolerance: Patient tolerated treatment well Patient left: in bed;with call bell/phone within reach;with bed alarm set;with family/visitor present   Time: 4163-8453 OT Time Calculation (min): 18 min Charges:  OT General Charges $OT Visit: 1 Procedure OT Evaluation $Initial OT Evaluation Tier I: 1 Procedure G-CodesBenito Mccreedy OTR/L [id number] 09/05/2015, 9:47 AM

## 2015-09-05 NOTE — Progress Notes (Signed)
No acute events AVSS Awake and alert Full strength bilaterally Bandage c/d/i Stable Possible rehab admission

## 2015-09-06 ENCOUNTER — Other Ambulatory Visit: Payer: Medicare Other

## 2015-09-06 ENCOUNTER — Ambulatory Visit: Payer: Medicare Other | Admitting: Internal Medicine

## 2015-09-06 ENCOUNTER — Ambulatory Visit: Payer: Medicare Other

## 2015-09-06 DIAGNOSIS — N179 Acute kidney failure, unspecified: Secondary | ICD-10-CM

## 2015-09-06 DIAGNOSIS — G8918 Other acute postprocedural pain: Secondary | ICD-10-CM

## 2015-09-06 DIAGNOSIS — D62 Acute posthemorrhagic anemia: Secondary | ICD-10-CM

## 2015-09-06 DIAGNOSIS — I10 Essential (primary) hypertension: Secondary | ICD-10-CM

## 2015-09-06 DIAGNOSIS — F4323 Adjustment disorder with mixed anxiety and depressed mood: Secondary | ICD-10-CM

## 2015-09-06 DIAGNOSIS — C7931 Secondary malignant neoplasm of brain: Principal | ICD-10-CM

## 2015-09-06 NOTE — Consult Note (Signed)
Physical Medicine and Rehabilitation Consult Reason for Consult: Right frontal brain tumor/history of metastatic non-small lung cancer Referring Physician: Dr. Kathyrn Sheriff   HPI: Tamara Ortiz is a 74 y.o. right handed female with history of metastatic non-small cell lung cancer treated with stereotactic radiosurgery October 2015. Patient lives alone independent not driving prior to admission. One level home with 3 steps to entry. Serial follow-up MRI demonstrated progressive enlargement of the right frontal enhancing lesion with increasing associated edema. Patient with complaints of worsening short-term memory, mild confusion and slurred speech as well as lack of appetite and generalized fatigue. Admitted 09/01/2015 and underwent stereotactic right frontal craniotomy for resection of tumor 09/02/2015 per Dr. Kathyrn Sheriff. Decadron protocol as indicated. Maintained on Keppra for seizure prophylaxis. Regular diet consistency. Physical and occupational therapy evaluations completed with recommendations of physical medicine rehabilitation consult.   Review of Systems  Constitutional: Positive for malaise/fatigue. Negative for fever and chills.       Lack of appetite  HENT: Negative for hearing loss.   Eyes: Negative for blurred vision and double vision.  Respiratory: Negative for cough and shortness of breath.   Cardiovascular: Negative for chest pain, palpitations and leg swelling.  Gastrointestinal: Negative for heartburn and nausea.  Genitourinary: Negative for dysuria and flank pain.  Musculoskeletal: Positive for back pain.  Neurological: Positive for speech change. Negative for loss of consciousness.  Psychiatric/Behavioral: Positive for depression. The patient has insomnia.        Anxiety, Short-term memory loss  All other systems reviewed and are negative.  Past Medical History  Diagnosis Date  . Hypertension   . HYPERLIPIDEMIA 11/05/2007  . ANXIETY 05/13/2009  . DEPRESSIVE  DISORDER 05/06/2008  . HYPERTENSION 11/05/2007  . ALLERGIC RHINITIS 11/05/2007  . CONSTIPATION, CHRONIC 05/13/2009  . UTI 05/06/2008  . BACK PAIN 05/11/2008  . OSTEOPOROSIS 11/05/2007  . INSOMNIA-SLEEP DISORDER-UNSPEC 05/13/2009  . FATIGUE 05/13/2009  . Swelling, mass, or lump in chest 11/05/2007  . LUNG CANCER, HX OF 05/06/2008  . Neuropathy ([place]) 08/09/2015  . Arthritis   . 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 Lung cancer ([place])   . Brain cancer [facility])    Past Surgical History  Procedure Laterality Date  . Tubal ligation    . S/p lul lung surgury  12/2007  . Lobectomy    . Eye surgery      cataracts removed. IOL , bilateral   . Tonsillectomy    . Appendectomy    . Craniotomy Right 09/02/2015    Procedure: CRANIOTOMY TUMOR EXCISION w/Curve;  Surgeon: Consuella Lose, MD;  Location: [facility];  Service: Neurosurgery;  Laterality: Right;  Right frontal stereotactic craniotomy for resection of tumor with brainlab  . Application of cranial navigation N/A 09/02/2015    Procedure: APPLICATION OF CRANIAL NAVIGATION;  Surgeon: Consuella Lose, MD;  Location: [place] [facility];  Service: Neurosurgery;  Laterality: N/A;  APPLICATION OF CRANIAL NAVIGATION   Family History  Problem Relation Age of Onset  . Heart disease Mother   . ALS Father   . Alcohol abuse Son     ETOH  . Cancer Neg Hx   . Colon cancer Neg Hx   . Esophageal cancer Neg Hx   . Stomach cancer Neg Hx   . Rectal cancer Neg Hx    Social History:  reports that she has never smoked. She has never used smokeless tobacco. She reports that she drinks alcohol. She  reports that she does not use illicit drugs. Allergies:  Allergies  Allergen Reactions  . Amoxicillin Hives and Itching    Has patient had a PCN reaction causing immediate rash, facial/tongue/throat swelling, SOB or lightheadedness with hypotension: -can't remember Has patient had a PCN reaction causing severe rash involving mucus membranes or skin necrosis: cant  remember Has patient had a PCN reaction that required hospitalization Yes- ed visit Has patient had a PCN reaction occurring within the last 10 years: No If all of the above answers are "NO", then may proceed with Cephalosporin use.   [place] [Levofloxacin] Nausea And Vomiting  . Lorazepam Other (See Comments)    confusion   Medications Prior to Admission  Medication Sig Dispense Refill  . Calcium Carbonate-Vitamin D (CALCIUM-VITAMIN D) 500-200 MG-UNIT per tablet Take 1 tablet by mouth daily.     . cholecalciferol (VITAMIN D) 1000 UNITS tablet Take 1,000 Units by mouth daily.    [name] dexamethasone (DECADRON) 4 MG tablet Take 1 tablet (4 mg total) by mouth 2 (two) times daily. 28 tablet 0  . folic acid (FOLVITE) 1 MG tablet TAKE 1 TABLET BY MOUTH DAILY 90 tablet 0  . HYDROcodone-acetaminophen (NORCO) 5-325 MG tablet Take 1 tablet by mouth every 6 (six) hours as needed for moderate pain. 30 tablet 0  . Multiple Vitamin (MULTIVITAMIN WITH MINERALS) TABS tablet Take 1 tablet by mouth daily. Women's One A Day    . polyethylene glycol powder (MIRALAX) powder Take 17 g by mouth daily as needed for moderate constipation.     [name] PRESCRIPTION MEDICATION Chemo at [facility]    . prochlorperazine (COMPAZINE) 10 MG tablet Take 10 mg by mouth every 6 (six) hours as needed for nausea or vomiting.    . vitamin C (ASCORBIC ACID) 500 MG tablet Take 500 mg by mouth daily.    [name] zolendronic acid (ZOMETA) 4 MG/5ML injection Inject 4 mg into the vein every 30 (thirty) days.     [name] zolpidem (AMBIEN CR) 6.25 MG CR tablet Take 1 tablet (6.25 mg total) by mouth at bedtime as needed for sleep. (Patient taking differently: Take 6.25 mg by mouth at bedtime. ) 30 tablet 0  . ondansetron (ZOFRAN ODT) 8 MG disintegrating tablet Take 1 tablet (8 mg total) by mouth every 8 (eight) hours as needed for nausea or vomiting. 30 tablet 1  . pantoprazole (PROTONIX) 40 MG tablet Take 1 tablet (40 mg total) by mouth daily. (Patient not taking:  Reported on 08/20/2015) 90 tablet 3    Home: Home Living Family/patient expects to be discharged to:: Inpatient rehab Living Arrangements: Alone Available Help at Discharge: Friend(s) Type of Home: House Home Access: Stairs to enter Technical brewer of Steps: 3 Entrance Stairs-Rails: Right Home Layout: One level Bathroom Shower/Tub: Insurance claims handler: None  Functional History: Prior Function Level of Independence: Independent Comments: did not drive Functional Status:  Mobility: Bed Mobility Overal bed mobility: Needs Assistance Bed Mobility: Supine to Sit, Sit to Supine Supine to sit: Min guard Sit to supine:  (see comments below) General bed mobility comments: pt able to return to supine at Mod I level but assist and cues given for scooting HOB. Pt able to come to sitting without assist, but was leaning posteriorly when trying to loosen up gown Transfers Overall transfer level: Needs assistance Equipment used: Rolling walker (2 wheeled), 1 person hand held assist Transfers: Sit to/from Stand Sit to Stand: Min guard General transfer comment: posterior lean, poor safety stand  to sit, sits uncontrolled Ambulation/Gait Ambulation/Gait assistance: Mod assist, Min assist, Supervision, Min guard Ambulation Distance (Feet): 120 Feet Assistive device: None, Rolling walker (2 wheeled) Gait Pattern/deviations: Step-to pattern, Shuffle, Decreased stride length General Gait Details: posterior lean with shuffling gait, pt reports was shuffling previously, assist of walker and cues able to take longer steps and at times decrease support, initially needed more assist for prevention LOB posterior    ADL: ADL Overall ADL's : Needs assistance/impaired Grooming: Oral care, Wash/dry face, Set up, Supervision/safety, Standing Grooming Details (indicate cue type and reason): staggered 1x but no physical assist required  Lower Body Dressing: Min guard, Sit to/from  stand Toilet Transfer: Minimal assistance, Ambulation (sit to stand from bed) Functional mobility during ADLs: Minimal assistance  Cognition: Cognition Overall Cognitive Status:  (unsure if difficulty following commands is baseline) Orientation Level: Oriented X4 Cognition Arousal/Alertness: Awake/alert Behavior During Therapy: WFL for tasks assessed/performed Overall Cognitive Status:  (unsure if difficulty following commands is baseline) Area of Impairment: Following commands, Problem solving Memory: Decreased short-term memory Following Commands: Follows one step commands inconsistently, Follows one step commands with increased time Problem Solving: Slow processing  Blood pressure 141/84, pulse 60, temperature 98.1 F (36.7 C), temperature source Oral, resp. rate 18, height '5\' 5"'$  (1.651 m), weight 57.335 kg (126 lb 6.4 oz), SpO2 99 %. Physical Exam  Vitals reviewed. Constitutional: She is oriented to person, place, and time. No distress.  HENT:  Head: Normocephalic.  Craniotomy site clean and dry with dressing in place  Eyes: Conjunctivae and EOM are normal.  Neck: Normal range of motion. Neck supple. No thyromegaly present.  Cardiovascular: Normal rate and regular rhythm.   Respiratory: Effort normal and breath sounds normal. No respiratory distress.  GI: Soft. Bowel sounds are normal. She exhibits no distension.  Musculoskeletal: She exhibits no edema or tenderness.  PROM WNL  Neurological: She is alert and oriented to person, place, and time. She has normal reflexes.  Makes good eye contact with examiner.  Follows simple commands.  Fair awareness of deficits.  Speech is a delayed at times Sensation to light touch intact  Skin: Skin is warm and dry. She is not diaphoretic.  Craniotomy site with dressing and staples c/d/i  Psychiatric: Her speech is delayed. She is slowed.    No results found. However, due to the size of the patient record, not all encounters were  searched. Please check Results Review for a complete set of results. No results found.  Assessment/Plan: Diagnosis: Debility secondary to right frontal brain tumor/history of metastatic non-small lung cancer Labs and images independently reviewed.  Records reviewed and summated above.  1. Does the need for close, 24 hr/day medical supervision in concert with the patient's rehab needs make it unreasonable for this patient to be served in a less intensive setting? No  2. Co-Morbidities requiring supervision/potential complications: metastatic non-small cell lung cancer, HTN (monitor and provide prns in accordance with increased physical exertion and pain), post-op pain (Biofeedback training with therapies to help reduce reliance on opiate pain medications, monitor pain control during therapies, and sedation at rest and titrate to maximum efficacy to ensure participation and gains in therapies), ABLA (transfuse if necessary to ensure appropriate perfusion for increased activity tolerance), AKI (avoid nephrotoxic meds), depression with anxiety (ensure anxiety and resulting apprehension do not limit functional progress; consider prn medications if warranted) 3. Due to safety, skin/wound care, disease management, medication administration, pain management and patient education, does the patient require 24 hr/day rehab nursing?  Potentially 4. Does the patient require coordinated care of a physician, rehab nurse, PT (1-2 hrs/day, 5 days/week), OT (1-2 hrs/day, 5 days/week) and SLP (1-2 hrs/day, 5 days/week) to address physical and functional deficits in the context of the above medical diagnosis(es)? No Addressing deficits in the following areas: balance, endurance, locomotion, strength, transferring, bathing, dressing, grooming, toileting, cognition, speech and psychosocial support 5. Can the patient actively participate in an intensive therapy program of at least 3 hrs of therapy per day at least 5 days per  week? Yes 6. The potential for patient to make measurable gains while on inpatient rehab is good 7. Anticipated functional outcomes upon discharge from inpatient rehab are n/a  with PT, n/a with OT, n/a with SLP. 8. Estimated rehab length of stay to reach the above functional goals is: N/A 9. Does the patient have adequate social supports and living environment to accommodate these discharge functional goals? Potentially 10. Anticipated D/C setting: Other 11. Anticipated post D/C treatments: HH therapy and Home excercise program 12. Overall Rehab/Functional Prognosis: good and fair  RECOMMENDATIONS: This patient's condition is appropriate for continued rehabilitative care in the following setting: Unfortunately, pt does not have a medical need to justify an IRF admission.  Further, the pt appears to be relatively high functioning with episodes of waxing and waning gait.  If pt is unable to return home with [facility] after acute medical issues have been addressed, would recommend SNF. Patient has agreed to participate in recommended program. Yes Note that insurance prior authorization may be required for reimbursement for recommended care.  Comment: Rehab Admissions Coordinator to follow up.  Delice Lesch, MD 09/06/2015

## 2015-09-06 NOTE — Care Management Important Message (Signed)
Important Message  Patient Details  Name: Tamara Ortiz MRN: [id number] Date of Birth: [birth year]   Medicare Important Message Given:  Yes    Marvia Pickles 09/06/2015, 11:35 AM

## 2015-09-06 NOTE — Progress Notes (Signed)
Physical Therapy Treatment Patient Details Name: Tamara Ortiz MRN: [id number] DOB: 1941/07/14 Today's Date: 09/06/2015    History of Present Illness Tamara Ortiz is a 74 year old woman I'm seeing with a history of metastatic non-small cell lung cancer, and was treated with stereotactic radiosurgery to 4 brain metastases back in October 2015.  Patient with R frontal lesion now s/p craniectomy     PT Comments    Patient is making good progress, however lives alone and she agrees she is not yet steady/safe enough to return home. Continues to require min assist with walking with RW. Poor attending to Lt side during mobility with frequent running into wall or objects. Pt is a high fall risk currently.   Follow Up Recommendations  SNF (CIR denied; lives alone )     Equipment Recommendations  Rolling walker with 5" wheels    Recommendations for Other Services       Precautions / Restrictions Precautions Precautions: Fall    Mobility  Bed Mobility Overal bed mobility: Needs Assistance Bed Mobility: Supine to Sit     Supine to sit: Min assist     General bed mobility comments: pt did not fully pull covers off her Lt foot, became tangled as moving to sitting and began to stand with foot entangled with no awareness  Transfers Overall transfer level: Needs assistance Equipment used: Rolling walker (2 wheeled);None Transfers: Sit to/from Stand Sit to Stand: Min guard         General transfer comment: max cues over repeated trials with sequencing to stand with RW; without RW, slight stagger step  Ambulation/Gait Ambulation/Gait assistance: Min assist Ambulation Distance (Feet): 100 Feet (no device; 200 with RW) Assistive device: None;Rolling walker (2 wheeled) Gait Pattern/deviations: Step-through pattern;Decreased stride length;Shuffle   Gait velocity interpretation: Below normal speed for age/gender General Gait Details: pt unable to lengthen stride or increase speed  with no device; made minor adjustments with practice and RW; assist to avoid objects on her left   Stairs Stairs: Yes Stairs assistance: Min assist Stair Management: One rail Right;Forwards;Step to pattern Number of Stairs: 10 General stair comments: pt instictively lead with correct leg and used step-to pattern; slight imbalance descending on 5th step with assist to recover  Wheelchair Mobility    Modified Rankin (Stroke Patients Only) Modified Rankin (Stroke Patients Only) Pre-Morbid Rankin Score: Slight disability Modified Rankin: Moderately severe disability     Balance Overall balance assessment: Needs assistance         Standing balance support: No upper extremity supported Standing balance-Leahy Scale: Fair Standing balance comment: static stance once achieved can be maintained                    Cognition Arousal/Alertness: Awake/alert Behavior During Therapy: WFL for tasks assessed/performed Overall Cognitive Status: No family/caregiver present to determine baseline cognitive functioning (also some expressive aphasia) Area of Impairment: Problem solving;Awareness           Awareness: Emergent (after running into object on Lt, did not begin to scan ) Problem Solving: Decreased initiation;Requires verbal cues General Comments: discussed using scanning to compensate for apparent Lt field cut, however pt continued to run into objects on her Lt    Exercises Other Exercises Other Exercises: weight shifting to alternate steps forward and back with each leg to incr stride length    General Comments        Pertinent Vitals/Pain Pain Assessment: No/denies pain    Home Living  Prior Function            PT Goals (current goals can now be found in the care plan section) Acute Rehab PT Goals Patient Stated Goal: feel strong enough to live alone Time For Goal Achievement: 09/17/15 Progress towards PT goals: Progressing  toward goals    Frequency  Min 3X/week    PT Plan Discharge plan needs to be updated;Frequency needs to be updated    Co-evaluation             End of Session Equipment Utilized During Treatment: Gait belt Activity Tolerance: Patient tolerated treatment well Patient left: in chair;with call bell/phone within reach;with chair alarm set     Time: 1025-1053 PT Time Calculation (min) (ACUTE ONLY): 28 min  Charges:  $Gait Training: 23-37 mins                    G Codes:      Tamara Ortiz 10-05-2015, 12:46 PM Pager [phone]

## 2015-09-06 NOTE — Progress Notes (Signed)
No issues overnight. Pt doing well, minimal HA.  EXAM:  BP 136/71 mmHg  Pulse 60  Temp(Src) 98.1 F (36.7 C) (Oral)  Resp 16  Ht '5\' 5"'$  (1.651 m)  Wt 57.335 kg (126 lb 6.4 oz)  BMI 21.03 kg/m2  SpO2 99%  Awake, alert, oriented  Speech fluent, appropriate  CN grossly intact  5/5 BUE/BLE   IMPRESSION:  73 y.o. female POD# 4 s/p resection right frontal mass, pathology c/w radiation necrosis, no active malignancy seen.  PLAN: - Will cont to mobilize with PT/OT. - Not CIR candidate, will likely d/c home tomorrow

## 2015-09-07 ENCOUNTER — Ambulatory Visit: Payer: Medicare Other | Admitting: Internal Medicine

## 2015-09-07 DIAGNOSIS — Z0289 Encounter for other administrative examinations: Secondary | ICD-10-CM

## 2015-09-07 NOTE — Progress Notes (Signed)
Occupational Therapy Treatment Patient Details Name: Tamara Ortiz MRN: [id number] DOB: 04-13-1941 Today's Date: 09/07/2015    History of present illness Tamara Ortiz is a 74 year old woman I'm seeing with a history of metastatic non-small cell lung cancer, and was treated with stereotactic radiosurgery to 4 brain metastases back in October 2015.  Patient with R frontal lesion now s/p craniectomy    OT comments  Patient progressing towards OT goals, continue plan of care for now. D/C plan changed to SNF secondary to CIR denial. Pt requires frequent and multiple cueing for safety, attention, memory, and initiation.    Follow Up Recommendations  SNF;Supervision/Assistance - 24 hour (CIR denied)    Equipment Recommendations  Other (comment) (TBD - defer to next venue)    Recommendations for Other Services  None at this time   Precautions / Restrictions Precautions Precautions: Fall Restrictions Weight Bearing Restrictions: No    Mobility Bed Mobility Overal bed mobility: Needs Assistance Bed Mobility: Supine to Sit;Sit to Supine     Supine to sit: Supervision Sit to supine: Supervision   General bed mobility comments: HOB slightly elevated, cues for technique and safety  Transfers Overall transfer level: Needs assistance Equipment used: Rolling walker (2 wheeled) Transfers: Sit to/from Stand Sit to Stand: Min guard General transfer comment: Cues for technique, hand placement, and overall safety    Balance Overall balance assessment: Needs assistance Sitting-balance support: No upper extremity supported;Feet supported Sitting balance-Leahy Scale: Fair     Standing balance support: Bilateral upper extremity supported;During functional activity Standing balance-Leahy Scale: Fair    ADL Overall ADL's : Needs assistance/impaired Eating/Feeding: Set up;Sitting   Grooming: Oral care;Set up;Supervision/safety;Standing;Brushing hair Lower Body Dressing: Min guard;Sit  to/from stand General ADL Comments: Pt with decreased safety awareness, attempting to stand without RW. Pt with LOB backwards during this. Pt supervision to min guard with functional mobility using RW. Cues needed for safety, safety with RW, and technique. Pt also needed frequent cueing for memory and attention.      Cognition   Behavior During Therapy: WFL for tasks assessed/performed Overall Cognitive Status: No family/caregiver present to determine baseline cognitive functioning Area of Impairment: Awareness;Attention;Memory   Current Attention Level: Selective Memory: Decreased short-term memory  Following Commands: Follows one step commands inconsistently;Follows one step commands with increased time   Awareness: Emergent Problem Solving: Decreased initiation;Requires verbal cues General Comments: Pt required multimodal cueing for attention and memory. Pt demonstrated poor safety awareness, standing without RW.                  Pertinent Vitals/ Pain       Pain Assessment: No/denies pain   Frequency Min 2X/week     Progress Toward Goals  OT Goals(current goals can now befound in the care plan section)  Progress towards OT goals: Progressing toward goals     Plan Discharge plan needs to be updated       End of Session Equipment Utilized During Treatment: Gait belt;Rolling walker   Activity Tolerance Patient tolerated treatment well   Patient Left in bed;with call bell/phone within reach;with bed alarm set     Time: 1341-1357 OT Time Calculation (min): 16 min  Charges: OT General Charges $OT Visit: 1 Procedure OT Treatments $Self Care/Home Management : 8-22 mins  Gabriana Wilmott , MS, OTR/L, CLT Pager: [phone]  09/07/2015, 2:09 PM

## 2015-09-07 NOTE — Progress Notes (Signed)
No issues overnight. Pt has no significant complaints  EXAM:  BP 123/66 mmHg  Pulse 61  Temp(Src) 98.5 F (36.9 C) (Oral)  Resp 20  Ht '5\' 5"'$  (1.651 m)  Wt 57.335 kg (126 lb 6.4 oz)  BMI 21.03 kg/m2  SpO2 99%  Awake, alert, oriented  Speech slightly broken but appropriate  CN grossly intact  5/5 BUE/BLE  Wound c/d/i  IMPRESSION:  74 y.o. female POD# 4 s/p resection right frontal lesion, at baseline  PLAN: - Remains medically stable for discharge. Looking at [facility] placement

## 2015-09-07 NOTE — NC FL2 (Signed)
[place] LEVEL OF CARE SCREENING TOOL     IDENTIFICATION  Patient Name: Tamara Ortiz Birthdate: Apr 28, 1941 Sex: female Admission Date (Current Location): 09/01/2015  [facility] and [place] Number: Herbalist and Address:  [facility] [place]. [facility], [place] [address], [place], [place] [address]      Provider Number: [id number]  Attending Physician Name and Address:  Consuella Lose, MD  Relative Name and Phone Number:  Cela, Newcom [phone]    Current Level of Care: Hospital Recommended Level of Care: [place] Prior Approval Number:    Date Approved/Denied:   PASRR Number:    Discharge Plan: SNF    Current Diagnoses: Patient Active Problem List   Diagnosis Date Noted  . Brain metastases ([place]) 09/01/2015  . Neuropathy ([place]) 08/09/2015  . Neoplastic malignant related fatigue   . Anorexia nervosa   . Abdominal pain, lower 06/22/2015  . Elevated blood pressure (not hypertension) 06/22/2015  . Palliative care encounter 05/07/2015  . DNR (do not resuscitate) discussion 05/07/2015  . Primary lung cancer with metastasis from lung to other site [facility])   . Loss of weight 04/29/2015  . Encounter for antineoplastic chemotherapy 03/22/2015  . Nausea without vomiting 03/22/2015  . Chemotherapy induced neutropenia ([place]) 02/01/2015  . Neoplasm related pain 10/14/2014  . Neutropenia ([place]) 10/14/2014  . Dehydration 07/27/2014  . Anorexia 07/27/2014  . Weight loss 07/27/2014  . Brain metastasis ([place]) 07/27/2014  . Cancer ([place]) 07/03/2014  . Nausea with vomiting 04/11/2014  . Carpal tunnel syndrome, bilateral 04/12/2012  . Diarrhea 01/21/2012  . Bone metastases ([place]) 11/06/2011  . Leg ulcer ([place]) 10/22/2011  . Left knee pain 12/28/2010  . Preventative health care 12/28/2010  . Non-small cell cancer of left lung ([place]) 02/25/2010  . Anxiety state 05/13/2009  . CONSTIPATION, CHRONIC 05/13/2009  . Insomnia 05/13/2009  .  Backache 05/11/2008  . Depression 05/06/2008  . Hyperlipidemia 11/05/2007  . Essential hypertension 11/05/2007  . ALLERGIC RHINITIS 11/05/2007  . OSTEOPOROSIS 11/05/2007    Orientation ACTIVITIES/SOCIAL BLADDER RESPIRATION    Self, Time, Situation, Place    Continent Normal  BEHAVIORAL SYMPTOMS/MOOD NEUROLOGICAL BOWEL NUTRITION STATUS      Continent Diet (Regular Diet)  PHYSICIAN VISITS COMMUNICATION OF NEEDS Height & Weight Skin    Verbally '5\' 5"'$  (165.1 cm) 126 lbs. Normal          AMBULATORY STATUS RESPIRATION    Supervision limited Normal      Personal Care Assistance Level of Assistance  Bathing, Dressing Bathing Assistance: Limited assistance   Dressing Assistance: Limited assistance      Functional Limitations Info                SPECIAL CARE FACTORS FREQUENCY  PT (By licensed PT)     PT Frequency: 5x a week             Additional Factors Info  Allergies   Allergies Info: AMOXICILLIN, LEVAQUIN, LORAZEPAM           Current Medications (09/07/2015):  This is the current hospital active medication list Current Facility-Administered Medications  Medication Dose Route Frequency Provider Last Rate Last Dose  . 0.9 %  sodium chloride infusion   Intravenous Continuous Consuella Lose, MD 75 mL/hr at 09/03/15 1900    . bisacodyl (DULCOLAX) suppository 10 mg  10 mg Rectal Daily PRN Consuella Lose, MD      . calcium-vitamin D (OSCAL WITH D) 500-200 MG-UNIT per tablet 1 tablet  1 tablet Oral Daily Consuella Lose, MD   1 tablet at 09/07/15 0857  . cholecalciferol (VITAMIN D) tablet 1,000 Units  1,000 Units Oral Daily Consuella Lose, MD   1,000 Units at 09/07/15 0856  . dexamethasone (DECADRON) tablet 4 mg  4 mg Oral BID Consuella Lose, MD   4 mg at 09/07/15 0857  . docusate sodium (COLACE) capsule 100 mg  100 mg Oral BID Consuella Lose, MD   100 mg at 09/07/15 0856  . feeding supplement (ENSURE ENLIVE) (ENSURE ENLIVE) liquid 237 mL  237 mL  Oral BID BM Satira Anis Ward, RD   237 mL at 09/07/15 1400  . folic acid (FOLVITE) tablet 1 mg  1 mg Oral Daily Consuella Lose, MD   1 mg at 09/07/15 0856  . HYDROcodone-acetaminophen (NORCO/VICODIN) 5-325 MG per tablet 1 tablet  1 tablet Oral Q6H PRN Consuella Lose, MD   1 tablet at 09/05/15 2232  . levETIRAcetam (KEPPRA) tablet 500 mg  500 mg Oral BID Consuella Lose, MD   500 mg at 09/07/15 0856  . multivitamin with minerals tablet 1 tablet  1 tablet Oral Daily Consuella Lose, MD   1 tablet at 09/07/15 0856  . ondansetron (ZOFRAN-ODT) disintegrating tablet 8 mg  8 mg Oral Q8H PRN Consuella Lose, MD      . pantoprazole (PROTONIX) EC tablet 40 mg  40 mg Oral Daily Jake Church Masters, RPH   40 mg at 09/06/15 2117  . polyethylene glycol (MIRALAX / GLYCOLAX) packet 17 g  17 g Oral Daily PRN Consuella Lose, MD   17 g at 09/05/15 1007  . prochlorperazine (COMPAZINE) tablet 10 mg  10 mg Oral Q6H PRN Consuella Lose, MD      . senna (SENOKOT) tablet 8.6 mg  1 tablet Oral BID Consuella Lose, MD   8.6 mg at 09/07/15 0856  . vitamin C (ASCORBIC ACID) tablet 500 mg  500 mg Oral Daily Consuella Lose, MD   500 mg at 09/07/15 0857  . zolpidem (AMBIEN) tablet 5 mg  5 mg Oral QHS PRN Consuella Lose, MD   5 mg at 09/06/15 2154     Discharge Medications: Please see discharge summary for a list of discharge medications.  Relevant Imaging Results:  Relevant Lab Results:  Recent Labs    Additional Information    Raia Amico, Jones Broom, LCSWA

## 2015-09-07 NOTE — Progress Notes (Signed)
Physical Therapy Treatment Patient Details Name: Tamara Ortiz MRN: [id number] DOB: 1941/01/03 Today's Date: 09/07/2015    History of Present Illness Tamara Ortiz is a 74 year old woman I'm seeing with a history of metastatic non-small cell lung cancer, and was treated with stereotactic radiosurgery to 4 brain metastases back in October 2015.  Patient with R frontal lesion now s/p craniectomy     PT Comments    Balance steadily improving. Aphasia (expressive and receptive) continues to be a challenge when trying to follow commands or explain herself (also improving).   Follow Up Recommendations  SNF (CIR denied; lives alone )     Equipment Recommendations  Rolling walker with 5" wheels    Recommendations for Other Services       Precautions / Restrictions Precautions Precautions: Fall Restrictions Weight Bearing Restrictions: No    Mobility  Bed Mobility Overal bed mobility: Needs Assistance Bed Mobility: Supine to Sit     Supine to sit: Supervision Sit to supine: Supervision   General bed mobility comments: HOB flat and pt manipulating covers/linens; supervision for safety as pt tends to transition straight to stand once EOB  Transfers Overall transfer level: Needs assistance Equipment used: Rolling walker (2 wheeled);None Transfers: Sit to/from Stand Sit to Stand: Min guard         General transfer comment: verbal, tactile and visual cues for safe use of DME  Ambulation/Gait Ambulation/Gait assistance: Min assist Ambulation Distance (Feet): 100 Feet (then 20 ft in room no RW) Assistive device: None;Rolling walker (2 wheeled) Gait Pattern/deviations: Step-through pattern;Decreased stride length;Drifts right/left   Gait velocity interpretation: Below normal speed for age/gender General Gait Details: better ability to lengthen stride, however could not maintain (especially if distracted by conversation); nearly hit objects on her left x 2   Stairs             Wheelchair Mobility    Modified Rankin (Stroke Patients Only) Modified Rankin (Stroke Patients Only) Pre-Morbid Rankin Score: Slight disability Modified Rankin: Moderately severe disability     Balance Overall balance assessment: Needs assistance Sitting-balance support: No upper extremity supported;Feet supported Sitting balance-Leahy Scale: Fair     Standing balance support: No upper extremity supported Standing balance-Leahy Scale: Poor Standing balance comment: slight stagger posteriorly                    Cognition Arousal/Alertness: Awake/alert Behavior During Therapy: WFL for tasks assessed/performed Overall Cognitive Status: Difficult to assess (also some expressive aphasia) Area of Impairment: Following commands   Current Attention Level: Selective Memory: Decreased short-term memory Following Commands: Follows one step commands with increased time (visual cues also)   Awareness: Emergent Problem Solving: Decreased initiation;Requires verbal cues General Comments: stood without therapist ready (bringing RW to her and stood before)    Exercises General Exercises - Lower Extremity Hip ABduction/ADduction: AAROM;Both;10 reps;Standing (alternate legs for wt-shifting) Hip Flexion/Marching: AAROM;Both;10 reps;Standing (alternating) Toe Raises: AAROM;Both;5 reps;Standing Heel Raises: AAROM;Both;10 reps;Standing Mini-Sqauts: AAROM;Both;10 reps;Standing Other Exercises Other Exercises: standing leg curls AAROM (bil UE support) x 10 Other Exercises: wall bumps--leaning posterior to touch wall with hips and then leaning anteriorly to regain balance    General Comments        Pertinent Vitals/Pain Pain Assessment: No/denies pain    Home Living                      Prior Function            PT Goals (current goals can  now be found in the care plan section) Acute Rehab PT Goals Patient Stated Goal: feel strong enough to live alone Time  For Goal Achievement: 09/17/15 Progress towards PT goals: Progressing toward goals    Frequency  Min 3X/week    PT Plan Current plan remains appropriate    Co-evaluation             End of Session Equipment Utilized During Treatment: Gait belt Activity Tolerance: Patient tolerated treatment well Patient left: with call bell/phone within reach;in bed;with bed alarm set;with family/visitor present     Time: 9024-0973 PT Time Calculation (min) (ACUTE ONLY): 19 min  Charges:  $Therapeutic Activity: 8-22 mins                    G Codes:      Tamara Ortiz Sep 24, 2015, 4:51 PM  Pager [phone]

## 2015-09-07 NOTE — Clinical Social Work Placement (Signed)
   CLINICAL SOCIAL WORK PLACEMENT  NOTE  Date:  09/07/2015  Patient Details  Name: Khadija Thier MRN: [id number] Date of Birth: Apr 12, 1941  Clinical Social Work is seeking post-discharge placement for this patient at the [place] level of care (*CSW will initial, date and re-position this form in  chart as items are completed):  Yes   Patient/family provided with [facility] Work Department's list of facilities offering this level of care within the geographic area requested by the patient (or if unable, by the patient's family).  Yes   Patient/family informed of their freedom to choose among providers that offer the needed level of care, that participate in Medicare, Medicaid or managed care program needed by the patient, have an available bed and are willing to accept the patient.  Yes   Patient/family informed of [facility]'s ownership interest in [facility] and [facility], as well as of the fact that they are under no obligation to receive care at these facilities.  PASRR submitted to EDS on 09/07/15     PASRR number received on       Existing PASRR number confirmed on       FL2 transmitted to all facilities in geographic area requested by pt/family on 09/07/15     FL2 transmitted to all facilities within larger geographic area on       Patient informed that his/her managed care company has contracts with or will negotiate with certain facilities, including the following:            Patient/family informed of bed offers received.  Patient chooses bed at       Physician recommends and patient chooses bed at      Patient to be transferred to   on  .  Patient to be transferred to facility by       Patient family notified on   of transfer.  Name of family member notified:        PHYSICIAN Please sign FL2     Additional Comment:    _______________________________________________ Ross Ludwig, LCSWA 09/07/2015, 7:36  PM

## 2015-09-07 NOTE — Clinical Social Work Note (Signed)
Clinical Social Work Assessment  Patient Details  Name: Tamara Ortiz MRN: [id number] Date of Birth: 04-Sep-1941  Date of referral:  09/07/15               Reason for consult:  Facility Placement                Permission sought to share information with:  Facility Sport and exercise psychologist, Family Supports Permission granted to share information::  Yes, Verbal Permission Granted  Name::     Tamara Ortiz, patient's son  Agency::  SNF admissions  Relationship::     Contact Information:     Housing/Transportation Living arrangements for the past 2 months:  Single Family Home Source of Information:  Patient, Adult Children Patient Interpreter Needed:  None Criminal Activity/Legal Involvement Pertinent to Current Situation/Hospitalization:  No - Comment as needed Significant Relationships:  Adult Children Lives with:  Self Do you feel safe going back to the place where you live?  Yes Need for family participation in patient care:  Yes (Comment) (Patient requests family to help with decision making)  Care giving concerns:  Patient feels she needs some short term rehab in order to return back home.   Social Worker assessment / plan:  Patient is a pleasant 74 year old female who lives by herself.  Patient is alert and oriented x4 and pleasant to talk to.  Patient states she has never been to rehab before, CSW explained process to her and her son who was on the phone.  Patient's son asked appropriate questions, and CSW explained what to expect at [facility] for short term rehab.  Patient did not express any other concerns or questions, patient was content and expressed that she understood.  Employment status:  Retired Forensic scientist:  Medicare PT Recommendations:  [place] / Referral to community resources:  [place]  Patient/Family's Response to care:  Patient and son in agreement to going to SNF for short term rehab.  Patient/Family's  Understanding of and Emotional Response to Diagnosis, Current Treatment, and Prognosis:  Patient aware of current treatment and diagnosis.  Emotional Assessment Appearance:  Appears stated age Attitude/Demeanor/Rapport:    Affect (typically observed):  Calm, Pleasant, Stable Orientation:  Oriented to Self, Oriented to Place, Oriented to  Time, Oriented to Situation Alcohol / Substance use:  Not Applicable Psych involvement (Current and /or in the community):  No (Comment)  Discharge Needs  Concerns to be addressed:  No discharge needs identified Readmission within the last 30 days:  No Current discharge risk:  None Barriers to Discharge:  No Barriers Identified   Ross Ludwig, LCSWA 09/07/2015, 7:32 PM

## 2015-09-07 NOTE — Progress Notes (Signed)
Inpatient Rehabilitation  I followed up with the patient regarding Dr. Serita Grit IP Rehab consult.  Pt. understands that IP Rehab is currently not an option (no medical necessity to justify).  Lorne Skeens CM is aware and states that Evette Cristal, CSW is also aware.  I will sign off. Please call if questions.   [place] Admissions Coordinator Cell [phone] Office [phone]

## 2015-09-08 MED ORDER — DEXAMETHASONE 1 MG PO TABS: 1.0000 mg | ORAL_TABLET | Freq: Two times a day (BID) | ORAL | Status: AC

## 2015-09-08 MED ORDER — LEVETIRACETAM 500 MG PO TABS: 500.0000 mg | ORAL_TABLET | Freq: Two times a day (BID) | ORAL | Status: AC

## 2015-09-08 NOTE — Progress Notes (Signed)
Patient will discharge to [facility] Anticipated discharge date: 12/7 Transportation by [facility]- scheduled for 2:30pm  CSW signing off.  Domenica Reamer, [place] Social Worker [phone]

## 2015-09-08 NOTE — Discharge Summary (Signed)
Physician Discharge Summary  Patient ID: Makeyla Govan MRN: [id number] DOB/AGE: May 10, 1941 74 y.o.  Admit date: 09/01/2015 Discharge date: 09/08/2015  Admission Diagnoses: Brain metastasis  Discharge Diagnoses: Same Active Problems:   Brain metastases [facility])   Discharged Condition: Stable  Hospital Course:  Mrs. Liadan Guizar is a 74 y.o. female electively admitted after right frontal craniotomy for resection of enlarging lesion. She was at baseline postop, and was evaluated by PT/OT. She does live alone, and SNF placement was recommended as she was not a candidate for CIR. She was ambulating, tolerating diet, and pain was under control.  Treatments: Surgery - Right frontal craniotomy for resection of tumor  Discharge Exam: Blood pressure 142/80, pulse 58, temperature 98 F (36.7 C), temperature source Oral, resp. rate 18, height '5\' 5"'$  (1.651 m), weight 57.335 kg (126 lb 6.4 oz), SpO2 97 %. Awake, alert, oriented Speech broken, mild expressive aphasia CN grossly intact 5/5 BUE/BLE Wound c/d/i  Disposition: SNF     Medication List    TAKE these medications        calcium-vitamin D 500-200 MG-UNIT tablet  Take 1 tablet by mouth daily.     cholecalciferol 1000 UNITS tablet  Commonly known as:  VITAMIN D  Take 1,000 Units by mouth daily.     dexamethasone 1 MG tablet  Commonly known as:  DECADRON  Take 1 tablet (1 mg total) by mouth 2 (two) times daily.     folic acid 1 MG tablet  Commonly known as:  FOLVITE  TAKE 1 TABLET BY MOUTH DAILY     HYDROcodone-acetaminophen 5-325 MG tablet  Commonly known as:  NORCO  Take 1 tablet by mouth every 6 (six) hours as needed for moderate pain.     levETIRAcetam 500 MG tablet  Commonly known as:  KEPPRA  Take 1 tablet (500 mg total) by mouth 2 (two) times daily.     MIRALAX powder  Generic drug:  polyethylene glycol powder  Take 17 g by mouth daily as needed for moderate constipation.     multivitamin with minerals  Tabs tablet  Take 1 tablet by mouth daily. Women's One A Day     ondansetron 8 MG disintegrating tablet  Commonly known as:  ZOFRAN ODT  Take 1 tablet (8 mg total) by mouth every 8 (eight) hours as needed for nausea or vomiting.     pantoprazole 40 MG tablet  Commonly known as:  PROTONIX  Take 1 tablet (40 mg total) by mouth daily.     PRESCRIPTION MEDICATION  Chemo at [facility]     prochlorperazine 10 MG tablet  Commonly known as:  COMPAZINE  Take 10 mg by mouth every 6 (six) hours as needed for nausea or vomiting.     vitamin C 500 MG tablet  Commonly known as:  ASCORBIC ACID  Take 500 mg by mouth daily.     zolpidem 6.25 MG CR tablet  Commonly known as:  AMBIEN CR  Take 1 tablet (6.25 mg total) by mouth at bedtime as needed for sleep.     ZOMETA 4 MG/5ML injection  Generic drug:  zolendronic acid  Inject 4 mg into the vein every 30 (thirty) days.           Follow-up Information    Follow up with [facility], Zaliyah Meikle, C, MD In 2 weeks.   Specialty:  Neurosurgery   Contact information:   [address] [place] [address] [place] [address] [phone]       Signed:  Shela Esses, C 09/08/2015, 10:00 AM

## 2015-09-10 ENCOUNTER — Telehealth: Payer: Self-pay | Admitting: *Deleted

## 2015-09-10 NOTE — Telephone Encounter (Signed)
Pt was on TCM list admitted for brian metastasis was sent to SNF.Marland KitchenJohny Ortiz

## 2015-10-05 ENCOUNTER — Telehealth: Payer: Self-pay | Admitting: Internal Medicine

## 2015-10-05 ENCOUNTER — Other Ambulatory Visit: Payer: Medicare Other

## 2015-10-05 ENCOUNTER — Ambulatory Visit: Payer: Medicare Other

## 2015-10-05 ENCOUNTER — Ambulatory Visit: Payer: Medicare Other | Admitting: Internal Medicine

## 2015-10-05 ENCOUNTER — Telehealth: Payer: Self-pay | Admitting: Medical Oncology

## 2015-10-05 NOTE — Telephone Encounter (Signed)
Patient left message today to cx/put on hold her appointments as she is being discharged from rehab tomorrow and does not know when she will be able to come in. Patient son also left message and asked that [employer] be contacted at [phone]. Message forwarded to MM desk nurse.

## 2015-10-05 NOTE — Telephone Encounter (Signed)
I left a message for son to call me back with where pt resides. She missed appt today .

## 2015-10-05 NOTE — Telephone Encounter (Signed)
I spoke to MSW and pt discharged to [facility]. Would still like update from son as to anticipated LOS.

## 2015-10-07 ENCOUNTER — Telehealth: Payer: Self-pay | Admitting: Medical Oncology

## 2015-10-07 NOTE — Telephone Encounter (Signed)
[place] called and will be discharged from rehab on 1/4. Keep 1/30 appt or r/s sooner- "i could careless"

## 2015-10-08 ENCOUNTER — Telehealth: Payer: Self-pay | Admitting: Internal Medicine

## 2015-10-08 NOTE — Telephone Encounter (Signed)
Noted  

## 2015-10-08 NOTE — Telephone Encounter (Signed)
Tamara Ortiz from [facility] called to schedule her rehab follow up appt and she just wanted to let you know this was for her brain surgery and her neurologist was Dr. Consuella Lose.

## 2015-10-12 ENCOUNTER — Telehealth: Payer: Self-pay | Admitting: Internal Medicine

## 2015-10-12 NOTE — Telephone Encounter (Signed)
Opal Sidles called from [facility] request verbal order for speech therapy evaluation for Mrs. Strom tomorrow if possible. Please give her a call back at   Phone # [phone]

## 2015-10-13 NOTE — Telephone Encounter (Signed)
Verbal authorization given per MD protocol

## 2015-10-14 ENCOUNTER — Ambulatory Visit (INDEPENDENT_AMBULATORY_CARE_PROVIDER_SITE_OTHER): Payer: Medicare Other | Admitting: Internal Medicine

## 2015-10-14 ENCOUNTER — Encounter: Payer: Self-pay | Admitting: Internal Medicine

## 2015-10-14 VITALS — BP 126/78 | HR 69 | Temp 98.3°F | Ht 65.0 in | Wt 125.0 lb

## 2015-10-14 DIAGNOSIS — G4700 Insomnia, unspecified: Secondary | ICD-10-CM | POA: Diagnosis not present

## 2015-10-14 DIAGNOSIS — F329 Major depressive disorder, single episode, unspecified: Secondary | ICD-10-CM | POA: Diagnosis not present

## 2015-10-14 DIAGNOSIS — I10 Essential (primary) hypertension: Secondary | ICD-10-CM

## 2015-10-14 DIAGNOSIS — R609 Edema, unspecified: Secondary | ICD-10-CM | POA: Diagnosis not present

## 2015-10-14 DIAGNOSIS — F32A Depression, unspecified: Secondary | ICD-10-CM

## 2015-10-14 MED ORDER — HYDROCHLOROTHIAZIDE 12.5 MG PO CAPS: 12.5000 mg | ORAL_CAPSULE | Freq: Every day | ORAL | Status: DC | PRN

## 2015-10-14 MED ORDER — ZOLPIDEM TARTRATE 5 MG PO TABS: 5.0000 mg | ORAL_TABLET | Freq: Every evening | ORAL | Status: DC | PRN

## 2015-10-14 NOTE — Progress Notes (Signed)
Pre visit review using our clinic review tool, if applicable. No additional management support is needed unless otherwise documented below in the visit note. 

## 2015-10-14 NOTE — Progress Notes (Signed)
Subjective:    Patient ID: Tamara Ortiz, female    DOB: [birth year], 75 y.o.   MRN: [id number]  HPI  Here to f/u, s/p hospn for brain met s/p craniotomy then 3 wks post d/c rehab, now home for the past wk. Pt denies chest pain, increased sob or doe, wheezing, orthopnea, PND, palpitations, dizziness or syncope, but has had intermittent mild bilat pedal edema in the past week.  Pt denies new neurological symptoms such as new headache, or facial or extremity weakness or numbness   Pt denies polydipsia, polyuria, Is having difficulty sleeping but now states the [place] cr 6.5 too much, with some next am hangover,   Pt denies fever, night sweats, or other constitutional symptoms  Wt most recently has been stable  Denies worsening depressive symptoms, suicidal ideation, or panic Wt Readings from Last 3 Encounters:  10/14/15 125 lb (56.7 kg)  09/03/15 126 lb 6.4 oz (57.335 kg)  08/20/15 125 lb 14.4 oz (57.108 kg)   Past Medical History  Diagnosis Date  . Hypertension   . HYPERLIPIDEMIA 11/05/2007  . ANXIETY 05/13/2009  . DEPRESSIVE DISORDER 05/06/2008  . HYPERTENSION 11/05/2007  . ALLERGIC RHINITIS 11/05/2007  . CONSTIPATION, CHRONIC 05/13/2009  . UTI 05/06/2008  . BACK PAIN 05/11/2008  . OSTEOPOROSIS 11/05/2007  . INSOMNIA-SLEEP DISORDER-UNSPEC 05/13/2009  . FATIGUE 05/13/2009  . Swelling, mass, or lump in chest 11/05/2007  . LUNG CANCER, HX OF 05/06/2008  . Neuropathy ([place]) 08/09/2015  . Arthritis   . 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 Lung cancer ([place])   . Brain cancer [facility])    Past Surgical History  Procedure Laterality Date  . Tubal ligation    . S/p lul lung surgury  12/2007  . Lobectomy    . Eye surgery      cataracts removed. IOL , bilateral   . Tonsillectomy    . Appendectomy    . Craniotomy Right 09/02/2015    Procedure: CRANIOTOMY TUMOR EXCISION w/Curve;  Surgeon: Consuella Lose, MD;  Location: [place] NEURO ORS;  Service: Neurosurgery;  Laterality: Right;  Right frontal  stereotactic craniotomy for resection of tumor with brainlab  . Application of cranial navigation N/A 09/02/2015    Procedure: APPLICATION OF CRANIAL NAVIGATION;  Surgeon: Consuella Lose, MD;  Location: [place] NEURO ORS;  Service: Neurosurgery;  Laterality: N/A;  APPLICATION OF CRANIAL NAVIGATION    reports that she has never smoked. She has never used smokeless tobacco. She reports that she drinks alcohol. She reports that she does not use illicit drugs. family history includes ALS in her father; Alcohol abuse in her son; Heart disease in her mother. There is no history of Cancer, Colon cancer, Esophageal cancer, Stomach cancer, or Rectal cancer. Allergies  Allergen Reactions  . Amoxicillin Hives and Itching    Has patient had a PCN reaction causing immediate rash, facial/tongue/throat swelling, SOB or lightheadedness with hypotension: -can't remember Has patient had a PCN reaction causing severe rash involving mucus membranes or skin necrosis: cant remember Has patient had a PCN reaction that required hospitalization Yes- ed visit Has patient had a PCN reaction occurring within the last 10 years: No If all of the above answers are "NO", then may proceed with Cephalosporin use.   [place] [Levofloxacin] Nausea And Vomiting  . Lorazepam Other (See Comments)    confusion   Current Outpatient Prescriptions on File Prior to Visit  Medication Sig Dispense Refill  . Calcium Carbonate-Vitamin  D (CALCIUM-VITAMIN D) 500-200 MG-UNIT per tablet Take 1 tablet by mouth daily.     . cholecalciferol (VITAMIN D) 1000 UNITS tablet Take 1,000 Units by mouth daily.    [name] dexamethasone (DECADRON) 1 MG tablet Take 1 tablet (1 mg total) by mouth 2 (two) times daily. 10 tablet 0  . folic acid (FOLVITE) 1 MG tablet TAKE 1 TABLET BY MOUTH DAILY 90 tablet 0  . Multiple Vitamin (MULTIVITAMIN WITH MINERALS) TABS tablet Take 1 tablet by mouth daily. Women's One A Day    . ondansetron (ZOFRAN ODT) 8 MG disintegrating  tablet Take 1 tablet (8 mg total) by mouth every 8 (eight) hours as needed for nausea or vomiting. 30 tablet 1  . polyethylene glycol powder (MIRALAX) powder Take 17 g by mouth daily as needed for moderate constipation.     [name] PRESCRIPTION MEDICATION Chemo at [facility]    . prochlorperazine (COMPAZINE) 10 MG tablet Take 10 mg by mouth every 6 (six) hours as needed for nausea or vomiting.    . vitamin C (ASCORBIC ACID) 500 MG tablet Take 500 mg by mouth daily.    [name] zolendronic acid (ZOMETA) 4 MG/5ML injection Inject 4 mg into the vein every 30 (thirty) days.     [name] HYDROcodone-acetaminophen (NORCO) 5-325 MG tablet Take 1 tablet by mouth every 6 (six) hours as needed for moderate pain. (Patient not taking: Reported on 10/14/2015) 30 tablet 0  . levETIRAcetam (KEPPRA) 500 MG tablet Take 1 tablet (500 mg total) by mouth 2 (two) times daily. (Patient not taking: Reported on 10/14/2015) 60 tablet 0  . pantoprazole (PROTONIX) 40 MG tablet Take 1 tablet (40 mg total) by mouth daily. (Patient not taking: Reported on 08/20/2015) 90 tablet 3   No current facility-administered medications on file prior to visit.   Review of Systems  Constitutional: Negative for unusual diaphoresis or night sweats HENT: Negative for ringing in ear or discharge Eyes: Negative for double vision or worsening visual disturbance.  Respiratory: Negative for choking and stridor.   Gastrointestinal: Negative for vomiting or other signifcant bowel change Genitourinary: Negative for hematuria or change in urine volume.  Musculoskeletal: Negative for other MSK pain or swelling Skin: Negative for color change and worsening wound.  Neurological: Negative for tremors and numbness other than noted  Psychiatric/Behavioral: Negative for decreased concentration or agitation other than above       Objective:   Physical Exam BP 126/78 mmHg  Pulse 69  Temp(Src) 98.3 F (36.8 C) (Oral)  Ht 5' 5" (1.651 m)  Wt 125 lb (56.7 kg)  BMI 20.80 kg/m2   SpO2 97% VS noted,  Constitutional: Pt appears in no significant distress HENT: Head: NCAT.  Right Ear: External ear normal.  Left Ear: External ear normal.  Eyes: . Pupils are equal, round, and reactive to light. Conjunctivae and EOM are normal Neck: Normal range of motion. Neck supple.  Cardiovascular: Normal rate and regular rhythm.   Pulmonary/Chest: Effort normal and breath sounds without rales or wheezing.  Abd:  Soft, NT, ND, + BS Neurological: Pt is alert. Not confused , motor grossly intact Skin: Skin is warm. No rash, no LE edema Psychiatric: Pt behavior is normal. No agitation. not depressed affect 1+ bilat pedal edema    Assessment & Plan:

## 2015-10-14 NOTE — Patient Instructions (Signed)
Please take all new medication as prescribed - the fluid pill only if you feel you have swelling in the feet  OK to decrease the ambien to 5 mg  Please continue all other medications as before, and refills have been done if requested.  Please have the pharmacy call with any other refills you may need.  Please keep your appointments with your specialists as you may have planned  Please return in 3 months, or sooner if needed

## 2015-10-16 DIAGNOSIS — R600 Localized edema: Secondary | ICD-10-CM | POA: Insufficient documentation

## 2015-10-16 DIAGNOSIS — R609 Edema, unspecified: Secondary | ICD-10-CM | POA: Insufficient documentation

## 2015-10-16 NOTE — Assessment & Plan Note (Signed)
stable overall by history and exam, recent data reviewed with pt, and pt to continue medical treatment as before,  to f/u any worsening symptoms or concerns BP Readings from Last 3 Encounters:  10/14/15 126/78  09/08/15 132/68  08/20/15 157/69

## 2015-10-16 NOTE — Assessment & Plan Note (Signed)
With mild next am hangover, ok to change the ambien cr 6.5 to ambien 5 qhs prn,  to f/u any worsening symptoms or concerns

## 2015-10-16 NOTE — Assessment & Plan Note (Signed)
stable overall by history and exam, recent data reviewed with pt, and pt to continue medical treatment as before,  to f/u any worsening symptoms or concerns Lab Results  Component Value Date   WBC 4.3 08/20/2015   HGB 11.2* 08/20/2015   HCT 33.6* 08/20/2015   PLT 133* 08/20/2015   GLUCOSE 141* 08/20/2015   CHOL 218* 08/12/2014   TRIG 104.0 08/12/2014   HDL 69.80 08/12/2014   LDLDIRECT 137.9 06/25/2013   LDLCALC 127* 08/12/2014   ALT 21 08/13/2015   AST 31 08/13/2015   NA 135 08/20/2015   K 4.0 08/20/2015   CL 102 08/20/2015   CREATININE 1.20* 09/01/2015   BUN 19 08/20/2015   CO2 25 08/20/2015   TSH 0.70 08/12/2014   INR 1.00 01/24/2010   HGBA1C  10/30/2007    5.4 (NOTE)   The ADA recommends the following therapeutic goals for glycemic   control related to Hgb A1C measurement:   Goal of Therapy:   < 7.0% Hgb A1C   Action Suggested:  > 8.0% Hgb A1C   Ref:  Diabetes Care, 22, Suppl. 1, 1999

## 2015-10-16 NOTE — Assessment & Plan Note (Signed)
Ok for hct 12.5 qd, prn

## 2015-10-18 ENCOUNTER — Telehealth: Payer: Self-pay | Admitting: Internal Medicine

## 2015-10-18 NOTE — Telephone Encounter (Signed)
Would like copy of current medications on file along with last OV.   Does have concern about safety in home.  Has caregivers but not any that are 24/7.  Needing information in this regard. Fax to [phone] Please call back as well.

## 2015-10-20 ENCOUNTER — Telehealth: Payer: Self-pay | Admitting: Internal Medicine

## 2015-10-20 ENCOUNTER — Emergency Department (HOSPITAL_COMMUNITY)
Admission: EM | Admit: 2015-10-20 | Discharge: 2015-10-21 | Disposition: A | Discharge status: Home / Self Care | Payer: Medicare Other | Attending: Emergency Medicine | Admitting: Emergency Medicine

## 2015-10-20 ENCOUNTER — Emergency Department (HOSPITAL_COMMUNITY): Payer: Medicare Other

## 2015-10-20 ENCOUNTER — Encounter (HOSPITAL_COMMUNITY): Payer: Self-pay | Admitting: *Deleted

## 2015-10-20 ENCOUNTER — Other Ambulatory Visit: Payer: Self-pay

## 2015-10-20 DIAGNOSIS — Z85841 Personal history of malignant neoplasm of brain: Secondary | ICD-10-CM | POA: Insufficient documentation

## 2015-10-20 DIAGNOSIS — Z8639 Personal history of other endocrine, nutritional and metabolic disease: Secondary | ICD-10-CM | POA: Diagnosis not present

## 2015-10-20 DIAGNOSIS — N390 Urinary tract infection, site not specified: Secondary | ICD-10-CM | POA: Diagnosis not present

## 2015-10-20 DIAGNOSIS — G4700 Insomnia, unspecified: Secondary | ICD-10-CM | POA: Insufficient documentation

## 2015-10-20 DIAGNOSIS — R531 Weakness: Secondary | ICD-10-CM

## 2015-10-20 DIAGNOSIS — M810 Age-related osteoporosis without current pathological fracture: Secondary | ICD-10-CM | POA: Diagnosis not present

## 2015-10-20 DIAGNOSIS — K5900 Constipation, unspecified: Secondary | ICD-10-CM | POA: Diagnosis not present

## 2015-10-20 DIAGNOSIS — Z79899 Other long term (current) drug therapy: Secondary | ICD-10-CM | POA: Insufficient documentation

## 2015-10-20 DIAGNOSIS — Z85118 Personal history of other malignant neoplasm of bronchus and lung: Secondary | ICD-10-CM | POA: Diagnosis not present

## 2015-10-20 DIAGNOSIS — I10 Essential (primary) hypertension: Secondary | ICD-10-CM | POA: Insufficient documentation

## 2015-10-20 DIAGNOSIS — Z8659 Personal history of other mental and behavioral disorders: Secondary | ICD-10-CM | POA: Insufficient documentation

## 2015-10-20 DIAGNOSIS — Z880 Allergy status to penicillin: Secondary | ICD-10-CM | POA: Insufficient documentation

## 2015-10-20 DIAGNOSIS — R609 Edema, unspecified: Secondary | ICD-10-CM | POA: Insufficient documentation

## 2015-10-20 DIAGNOSIS — Z7952 Long term (current) use of systemic steroids: Secondary | ICD-10-CM | POA: Insufficient documentation

## 2015-10-20 LAB — URINE MICROSCOPIC-ADD ON

## 2015-10-20 LAB — CBG MONITORING, ED: Glucose-Capillary: 100 mg/dL — ABNORMAL HIGH (ref 65–99)

## 2015-10-20 LAB — COMPREHENSIVE METABOLIC PANEL
ALBUMIN: 3.4 g/dL — AB (ref 3.5–5.0)
ALK PHOS: 80 U/L (ref 38–126)
ALT: 26 U/L (ref 14–54)
AST: 21 U/L (ref 15–41)
Anion gap: 10 (ref 5–15)
BILIRUBIN TOTAL: 0.6 mg/dL (ref 0.3–1.2)
BUN: 13 mg/dL (ref 6–20)
CALCIUM: 9.4 mg/dL (ref 8.9–10.3)
CO2: 28 mmol/L (ref 22–32)
Chloride: 99 mmol/L — ABNORMAL LOW (ref 101–111)
Creatinine, Ser: 0.95 mg/dL (ref 0.44–1.00)
GFR calc Af Amer: >60 mL/min (ref >60)
GFR calc non Af Amer: 58 mL/min — ABNORMAL LOW (ref >60)
GLUCOSE: 103 mg/dL — AB (ref 65–99)
Potassium: 3.7 mmol/L (ref 3.5–5.1)
Sodium: 137 mmol/L (ref 135–145)
TOTAL PROTEIN: 6.6 g/dL (ref 6.5–8.1)

## 2015-10-20 LAB — CBC
HCT: 40.4 % (ref 36.0–46.0)
Hemoglobin: 13.8 g/dL (ref 12.0–15.0)
MCH: 33.7 pg (ref 26.0–34.0)
MCHC: 34.2 g/dL (ref 30.0–36.0)
MCV: 98.5 fL (ref 78.0–100.0)
Platelets: 225 10*3/uL (ref 150–400)
RBC: 4.1 MIL/uL (ref 3.87–5.11)
RDW: 13.6 % (ref 11.5–15.5)
WBC: 6.1 10*3/uL (ref 4.0–10.5)

## 2015-10-20 LAB — BRAIN NATRIURETIC PEPTIDE: B NATRIURETIC PEPTIDE 5: 67.3 pg/mL (ref 0.0–100.0)

## 2015-10-20 LAB — I-STAT TROPONIN, ED: Troponin i, poc: 0.01 ng/mL (ref 0.00–0.08)

## 2015-10-20 LAB — URINALYSIS, ROUTINE W REFLEX MICROSCOPIC
Bilirubin Urine: NEGATIVE
GLUCOSE, UA: NEGATIVE mg/dL
Ketones, ur: NEGATIVE mg/dL
LEUKOCYTES UA: NEGATIVE
Nitrite: POSITIVE — AB
Protein, ur: NEGATIVE mg/dL
SPECIFIC GRAVITY, URINE: 1.015 (ref 1.005–1.030)
pH: 6.5 (ref 5.0–8.0)

## 2015-10-20 MED ORDER — CEPHALEXIN 500 MG PO CAPS
500.0000 mg | ORAL_CAPSULE | Freq: Three times a day (TID) | ORAL | Status: DC
Start: 2015-10-20 — End: 2015-11-12

## 2015-10-20 MED ORDER — SODIUM CHLORIDE 0.9 % IV BOLUS (SEPSIS)
500.0000 mL | Freq: Once | INTRAVENOUS | Status: AC
  Administered 2015-10-20: 500 mL via INTRAVENOUS

## 2015-10-20 MED ORDER — DEXTROSE 5 % IV SOLN
1.0000 g | Freq: Once | INTRAVENOUS | Status: AC
  Administered 2015-10-20: 1 g via INTRAVENOUS
  Filled 2015-10-20: qty 10

## 2015-10-20 NOTE — Telephone Encounter (Signed)
Verbal authorization given per MD via confidential VM

## 2015-10-20 NOTE — Care Management Note (Signed)
Case Management Note  Patient Details  Name: Tamara Ortiz MRN: [id number] Date of Birth: [birth year]  Subjective/Objective:            Patient presents to [facility] ED s/p  Rehab after right frontal craniotomy in 12/16 . Patient complaining of gait unsteady she lives alone     Action/Plan: CM met with patient to discuss recommendations for  [facility] services HHRN/ PT/OT/HHA  patient is agreeable. Offered choice, patient selected [facility]. Referral faxed in to [phone]. Teach back done patient verbalized understanding. Updated T. Kirinchenko.  No further  ED CM need identified  Expected Discharge Date:     10/20/15             Expected Discharge Plan:  [place]  In-House Referral:     Discharge planning Services  CM Consult  Post Acute Care Choice:    Choice offered to:  Patient  DME Arranged:    DME Agency:     HH Arranged:  RN, PT, Nurse's Aide, OT [place] Agency:  [facility]  Status of Service:  Completed, signed off  Medicare Important Message Given:    Date Medicare IM Given:    Medicare IM give by:    Date Additional Medicare IM Given:    Additional Medicare Important Message give by:     If discussed at [place] of Stay Meetings, dates discussed:    Additional CommentsLaurena Slimmer, RN 10/20/2015, 11:01 PM

## 2015-10-20 NOTE — Telephone Encounter (Signed)
Ok for verbal 

## 2015-10-20 NOTE — ED Notes (Signed)
Pt reports brain surgery and recently in rehab, went home 3 days ago and lives by herself. Pt feels like she "isn't thinking clearly" and unable to care for herself. Has swelling to legs and also recent constipation, nausea.

## 2015-10-20 NOTE — ED Notes (Signed)
Case manager at bedside 

## 2015-10-20 NOTE — ED Notes (Addendum)
PA Kirichenko bedside.

## 2015-10-20 NOTE — Telephone Encounter (Signed)
Ok to for verbal authorization?

## 2015-10-20 NOTE — Telephone Encounter (Signed)
Tamara Ortiz is calling to request verbal orders for social worker for the purpose of assisted living placement. This is per the patients request

## 2015-10-20 NOTE — ED Provider Notes (Signed)
CSN: [id number]     Arrival date & time 10/20/15  1432 History   First MD Initiated Contact with Patient 10/20/15 1935     Chief Complaint  Patient presents with  . Leg Swelling  . Altered Mental Status     (Consider location/radiation/quality/duration/timing/severity/associated sxs/prior Treatment) HPI Tamara Ortiz is a 75 y.o. 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 She presents to emergency department complaining of generalized weakness, feels like she isn't thinking clearly, unable to care for herself at home. She states she was in rehabilitation after her surgery and just went home about a week ago. She says she still able to walk but feels unsteady on her feet. She denies any recent falls. She denies any focal neurological complaints. She denies any major headaches. She denies any blurred vision. She denies any memory loss or confusion. She states that she does not have any family locally and no one to help her.  Past Medical History  Diagnosis Date  . Hypertension   . HYPERLIPIDEMIA 11/05/2007  . ANXIETY 05/13/2009  . DEPRESSIVE DISORDER 05/06/2008  . HYPERTENSION 11/05/2007  . ALLERGIC RHINITIS 11/05/2007  . CONSTIPATION, CHRONIC 05/13/2009  . UTI 05/06/2008  . BACK PAIN 05/11/2008  . OSTEOPOROSIS 11/05/2007  . INSOMNIA-SLEEP DISORDER-UNSPEC 05/13/2009  . FATIGUE 05/13/2009  . Swelling, mass, or lump in chest 11/05/2007  . LUNG CANCER, HX OF 05/06/2008  . Neuropathy ([place]) 08/09/2015  . Arthritis   . 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 Lung cancer ([place])   . [facility])    Past Surgical History  Procedure Laterality Date  . Tubal ligation    . S/p lul lung surgury  12/2007  . Lobectomy    . Eye surgery      cataracts removed. IOL , bilateral   . Tonsillectomy    . Appendectomy    . Craniotomy Right  09/02/2015    Procedure: CRANIOTOMY TUMOR EXCISION w/Curve;  Surgeon: Consuella Lose, MD;  Location: [place] NEURO ORS;  Service: Neurosurgery;  Laterality: Right;  Right frontal stereotactic craniotomy for resection of tumor with brainlab  . Application of cranial navigation N/A 09/02/2015    Procedure: APPLICATION OF CRANIAL NAVIGATION;  Surgeon: Consuella Lose, MD;  Location: [place] NEURO ORS;  Service: Neurosurgery;  Laterality: N/A;  APPLICATION OF CRANIAL NAVIGATION   Family History  Problem Relation Age of Onset  . Heart disease Mother   . ALS Father   . Alcohol abuse Son     ETOH  . Cancer Neg Hx   . Colon cancer Neg Hx   . Esophageal cancer Neg Hx   . Stomach cancer Neg Hx   . Rectal cancer Neg Hx    Social History  Substance Use Topics  . Smoking status: Never Smoker   . Smokeless tobacco: Never Used  . Alcohol Use: Yes     Comment: occasionally   OB History    Gravida Para Term Preterm AB TAB SAB Ectopic Multiple Living   1 1             Review of Systems    Allergies  Amoxicillin; Levaquin; and Lorazepam  Home Medications   Prior to Admission medications   Medication Sig Start Date End Date Taking? Authorizing Provider  Calcium Carbonate-Vitamin D (CALCIUM-VITAMIN D) 500-200 MG-UNIT per tablet  Take 1 tablet by mouth daily.     Historical Provider, MD  cholecalciferol (VITAMIN D) 1000 UNITS tablet Take 1,000 Units by mouth daily.    Historical Provider, MD  dexamethasone (DECADRON) 1 MG tablet Take 1 tablet (1 mg total) by mouth 2 (two) times daily. 09/08/15   Consuella Lose, MD  folic acid (FOLVITE) 1 MG tablet TAKE 1 TABLET BY MOUTH DAILY 03/03/15   Curt Bears, MD  hydrochlorothiazide (MICROZIDE) 12.5 MG capsule Take 1 capsule (12.5 mg total) by mouth daily as needed. 10/14/15   Biagio Borg, MD  HYDROcodone-acetaminophen (NORCO) 5-325 MG tablet Take 1 tablet by mouth every 6 (six) hours as needed for moderate pain. Patient not taking: Reported on 10/14/2015  08/18/15   Curt Bears, MD  levETIRAcetam (KEPPRA) 500 MG tablet Take 1 tablet (500 mg total) by mouth 2 (two) times daily. Patient not taking: Reported on 10/14/2015 09/08/15   Consuella Lose, MD  Multiple Vitamin (MULTIVITAMIN WITH MINERALS) TABS tablet Take 1 tablet by mouth daily. Women's One A Day    Historical Provider, MD  ondansetron (ZOFRAN ODT) 8 MG disintegrating tablet Take 1 tablet (8 mg total) by mouth every 8 (eight) hours as needed for nausea or vomiting. 06/22/15   Biagio Borg, MD  pantoprazole (PROTONIX) 40 MG tablet Take 1 tablet (40 mg total) by mouth daily. Patient not taking: Reported on 08/20/2015 06/22/15   Biagio Borg, MD  polyethylene glycol powder [employer]) powder Take 17 g by mouth daily as needed for moderate constipation.     Historical Provider, MD  PRESCRIPTION MEDICATION Chemo at [facility]    Historical Provider, MD  prochlorperazine (COMPAZINE) 10 MG tablet Take 10 mg by mouth every 6 (six) hours as needed for nausea or vomiting.    Historical Provider, MD  vitamin C (ASCORBIC ACID) 500 MG tablet Take 500 mg by mouth daily.    Historical Provider, MD  zolendronic acid (ZOMETA) 4 MG/5ML injection Inject 4 mg into the vein every 30 (thirty) days.     Historical Provider, MD  zolpidem (AMBIEN) 5 MG tablet Take 1 tablet (5 mg total) by mouth at bedtime as needed for sleep. 10/14/15   Biagio Borg, MD   BP 116/75 mmHg  Pulse 64  Temp(Src) 98.4 F (36.9 C) (Oral)  Resp 16  Wt 55.293 kg  SpO2 100% Physical Exam  Constitutional: She is oriented to person, place, and time. She appears well-developed and well-nourished. No distress.  HENT:  Head: Normocephalic.  Eyes: Conjunctivae and EOM are normal. Pupils are equal, round, and reactive to light.  Right eye ptosis, from brain surgery  Neck: Normal range of motion. Neck supple.  Cardiovascular: Normal rate, regular rhythm and normal heart sounds.   Pulmonary/Chest: Effort normal and breath sounds normal. No  respiratory distress. She has no wheezes. She has no rales.  Abdominal: Soft. Bowel sounds are normal. She exhibits no distension. There is no tenderness. There is no rebound.  Musculoskeletal: She exhibits edema.  2plus Pitting edema bilaterally  Neurological: She is alert and oriented to person, place, and time. No cranial nerve deficit. Coordination normal.  5/5 and equal upper and lower extremity strength bilaterally. Equal grip strength bilaterally. Normal finger to nose and heel to shin. No pronator drift. Patellar reflexes 2+   Skin: Skin is warm and dry.  Psychiatric: She has a normal mood and affect. Her behavior is normal.  Nursing note and vitals reviewed.   ED Course  Procedures (including  critical care time) Labs Review Labs Reviewed  COMPREHENSIVE METABOLIC PANEL - Abnormal; Notable for the following:    Chloride 99 (*)    Glucose, Bld 103 (*)    Albumin 3.4 (*)    GFR calc non Af Amer 58 (*)    All other components within normal limits  CBG MONITORING, ED - Abnormal; Notable for the following:    Glucose-Capillary 100 (*)    All other components within normal limits  CBC  URINALYSIS, ROUTINE W REFLEX MICROSCOPIC (NOT AT [facility])  BRAIN NATRIURETIC PEPTIDE  I-STAT TROPOININ, ED    Imaging Review Dg Chest 2 View  10/20/2015  CLINICAL DATA:  75 year old female with leg swelling and weakness for the past several weeks. EXAM: CHEST  2 VIEW COMPARISON:  Chest x-ray 08/13/2015. FINDINGS: Status post left upper lobectomy. Compensatory hyperexpansion of the left lower lobe. No consolidative airspace disease. No pleural effusions. No pneumothorax. No pulmonary nodule or mass noted. Pulmonary vasculature and the cardiomediastinal silhouette are within normal limits. IMPRESSION: 1.  No radiographic evidence of acute cardiopulmonary disease. 2. Status post left upper lobectomy. Electronically Signed   By: Vinnie Langton M.D.   On: 10/20/2015 20:22   I have personally reviewed and  evaluated these images and lab results as part of my medical decision-making.   EKG Interpretation None      MDM   Final diagnoses:  UTI (lower urinary tract infection)  Weakness   Pt with generalized weakness, coming from home, recent craniotomy for brain metastases. Patient states she feels weak and feels like she is unsteady walking around the house and performing ADLs. Patient is in no acute distress on exam. Neurologically intact. We'll check labs, urinalysis, chest x-ray.  10:01 PM Patient's labs unremarkable, urinalysis showing possible infection, started on Rocephin here. Vital signs remain normal. I do not think patient needs admission to the hospital, however discussed getting her some help at home. Patient also evaluated by Dr. Cathleen Fears who agrees. I spoke with Mariann Laster with case management and she will get patient set up with home health nurse and PT. Face to face order placed. Pt agreeable to the plan.   11:42 PM Received rocephin. No distress. Comfortable with going home. Return precuations discussed. Home ith keflex for UTI.   Filed Vitals:   10/20/15 2230 10/20/15 2300 10/20/15 2330 10/21/15 0000  BP: 149/86 136/79 107/70 139/83  Pulse: 63 72 57 62  Temp:      TempSrc:      Resp: '18 24 18 24  '$ Weight:      SpO2: 100% 98% 98% 99%        Jeannett Senior, PA-C 10/21/15 0127  Orlie Dakin, MD 10/22/15 1610

## 2015-10-20 NOTE — ED Notes (Signed)
[facility] has been called  

## 2015-10-20 NOTE — Discharge Instructions (Signed)
Take keflex as prescribed until all gone for infection. Expect a nurse to come to your house tomorrow or Friday. Follow up with primary care doctor. Return if worsening.   Urinary Tract Infection Urinary tract infections (UTIs) can develop anywhere along your urinary tract. Your urinary tract is your body's drainage system for removing wastes and extra water. Your urinary tract includes two kidneys, two ureters, a bladder, and a urethra. Your kidneys are a pair of bean-shaped organs. Each kidney is about the size of your fist. They are located below your ribs, one on each side of your spine. CAUSES Infections are caused by microbes, which are microscopic organisms, including fungi, viruses, and bacteria. These organisms are so small that they can only be seen through a microscope. Bacteria are the microbes that most commonly cause UTIs. SYMPTOMS  Symptoms of UTIs may vary by age and gender of the patient and by the location of the infection. Symptoms in young women typically include a frequent and intense urge to urinate and a painful, burning feeling in the bladder or urethra during urination. Older women and men are more likely to be tired, shaky, and weak and have muscle aches and abdominal pain. A fever may mean the infection is in your kidneys. Other symptoms of a kidney infection include pain in your back or sides below the ribs, nausea, and vomiting. DIAGNOSIS To diagnose a UTI, your caregiver will ask you about your symptoms. Your caregiver will also ask you to provide a urine sample. The urine sample will be tested for bacteria and white blood cells. White blood cells are made by your body to help fight infection. TREATMENT  Typically, UTIs can be treated with medication. Because most UTIs are caused by a bacterial infection, they usually can be treated with the use of antibiotics. The choice of antibiotic and length of treatment depend on your symptoms and the type of bacteria causing your  infection. HOME CARE INSTRUCTIONS  If you were prescribed antibiotics, take them exactly as your caregiver instructs you. Finish the medication even if you feel better after you have only taken some of the medication.  Drink enough water and fluids to keep your urine clear or pale yellow.  Avoid caffeine, tea, and carbonated beverages. They tend to irritate your bladder.  Empty your bladder often. Avoid holding urine for long periods of time.  Empty your bladder before and after sexual intercourse.  After a bowel movement, women should cleanse from front to back. Use each tissue only once. SEEK MEDICAL CARE IF:   You have back pain.  You develop a fever.  Your symptoms do not begin to resolve within 3 days. SEEK IMMEDIATE MEDICAL CARE IF:   You have severe back pain or lower abdominal pain.  You develop chills.  You have nausea or vomiting.  You have continued burning or discomfort with urination. MAKE SURE YOU:   Understand these instructions.  Will watch your condition.  Will get help right away if you are not doing well or get worse.   This information is not intended to replace advice given to you by your health care provider. Make sure you discuss any questions you have with your health care provider.   Document Released: 06/28/2005 Document Revised: 06/09/2015 Document Reviewed: 10/27/2011 Elsevier Interactive Patient Education [employer].

## 2015-10-20 NOTE — ED Provider Notes (Signed)
reports that she was nervous and anxious last night and today feels generally weak. She denies fever denies pain anywhere. No other associated symptoms. Denies nausea. She is with a cane at baseline. Patient reports that has right-sided ptosis as result of brain cancer. On exam alert nontoxic HEENT exam right-sided ptosis otherwise no facial asymmetry neck supple lungs clear to auscultation heart regular rate and rhythm neurologic Glasgow Coma Score 15 was all extremity as well, walks with minimal assistance. Patient states that she may need help at home. Case management consulted  Orlie Dakin, MD 10/20/15 2340

## 2015-10-21 ENCOUNTER — Telehealth: Payer: Self-pay | Admitting: *Deleted

## 2015-10-21 NOTE — Telephone Encounter (Signed)
Home Health Agency called to get discharge note/report from ER.  ERCM faxed to [phone].

## 2015-10-23 LAB — URINE CULTURE: Culture: >=100000

## 2015-10-24 ENCOUNTER — Telehealth: Payer: Self-pay | Admitting: Emergency Medicine

## 2015-10-24 ENCOUNTER — Telehealth (HOSPITAL_BASED_OUTPATIENT_CLINIC_OR_DEPARTMENT_OTHER): Payer: Self-pay | Admitting: Emergency Medicine

## 2015-10-24 NOTE — Telephone Encounter (Signed)
Post ED Visit - Positive Culture Follow-up  Culture report reviewed by antimicrobial stewardship pharmacist:  '[]'$  Elenor Quinones, Pharm.D. '[]'$  Heide Guile, Pharm.D., BCPS '[]'$  Parks Neptune, Pharm.D. '[]'$  Alycia Rossetti, Pharm.D., BCPS '[]'$  Sidney, Pharm.D., BCPS, AAHIVP '[]'$  Legrand Como, Pharm.D., BCPS, AAHIVP '[x]'$  Milus Glazier, Pharm.D. '[]'$  Stephens November, Pharm.D.  Positive urine culture E.coli Treated with cephalexin, organism sensitive to the same and no further patient follow-up is required at this time.  Hazle Nordmann 10/24/2015, 11:27 AM

## 2015-10-26 ENCOUNTER — Telehealth: Payer: Self-pay | Admitting: Internal Medicine

## 2015-10-26 NOTE — Telephone Encounter (Signed)
Trying to get patient into assisted living.  Needs FL2 to be completed.  Faxed form Friday afternoon.  Would like back as soon as possible because patient is trying to get into home this week.  Fax number [phone].  Did notify that Dr. Jenny Reichmann does not work on Mondays.

## 2015-10-28 ENCOUNTER — Emergency Department (HOSPITAL_COMMUNITY): Payer: Medicare Other

## 2015-10-28 ENCOUNTER — Telehealth: Payer: Self-pay | Admitting: Internal Medicine

## 2015-10-28 ENCOUNTER — Encounter (HOSPITAL_COMMUNITY): Payer: Self-pay | Admitting: Emergency Medicine

## 2015-10-28 ENCOUNTER — Emergency Department (HOSPITAL_COMMUNITY)
Admission: EM | Admit: 2015-10-28 | Discharge: 2015-10-29 | Disposition: A | Discharge status: Home / Self Care | Payer: Medicare Other | Attending: Emergency Medicine | Admitting: Emergency Medicine

## 2015-10-28 DIAGNOSIS — Z85841 Personal history of malignant neoplasm of brain: Secondary | ICD-10-CM | POA: Insufficient documentation

## 2015-10-28 DIAGNOSIS — F0390 Unspecified dementia without behavioral disturbance: Secondary | ICD-10-CM | POA: Insufficient documentation

## 2015-10-28 DIAGNOSIS — Z880 Allergy status to penicillin: Secondary | ICD-10-CM | POA: Insufficient documentation

## 2015-10-28 DIAGNOSIS — Z87440 Personal history of urinary (tract) infections: Secondary | ICD-10-CM | POA: Diagnosis not present

## 2015-10-28 DIAGNOSIS — R5381 Other malaise: Secondary | ICD-10-CM | POA: Insufficient documentation

## 2015-10-28 DIAGNOSIS — M810 Age-related osteoporosis without current pathological fracture: Secondary | ICD-10-CM | POA: Diagnosis not present

## 2015-10-28 DIAGNOSIS — M1990 Unspecified osteoarthritis, unspecified site: Secondary | ICD-10-CM | POA: Insufficient documentation

## 2015-10-28 DIAGNOSIS — Z79899 Other long term (current) drug therapy: Secondary | ICD-10-CM | POA: Insufficient documentation

## 2015-10-28 DIAGNOSIS — I10 Essential (primary) hypertension: Secondary | ICD-10-CM | POA: Insufficient documentation

## 2015-10-28 DIAGNOSIS — Z7952 Long term (current) use of systemic steroids: Secondary | ICD-10-CM | POA: Insufficient documentation

## 2015-10-28 DIAGNOSIS — Z85830 Personal history of malignant neoplasm of bone: Secondary | ICD-10-CM | POA: Diagnosis not present

## 2015-10-28 DIAGNOSIS — R110 Nausea: Secondary | ICD-10-CM

## 2015-10-28 DIAGNOSIS — Z8639 Personal history of other endocrine, nutritional and metabolic disease: Secondary | ICD-10-CM | POA: Diagnosis not present

## 2015-10-28 DIAGNOSIS — Z792 Long term (current) use of antibiotics: Secondary | ICD-10-CM | POA: Insufficient documentation

## 2015-10-28 DIAGNOSIS — G4700 Insomnia, unspecified: Secondary | ICD-10-CM | POA: Diagnosis not present

## 2015-10-28 DIAGNOSIS — R4182 Altered mental status, unspecified: Secondary | ICD-10-CM | POA: Insufficient documentation

## 2015-10-28 DIAGNOSIS — Z85118 Personal history of other malignant neoplasm of bronchus and lung: Secondary | ICD-10-CM | POA: Insufficient documentation

## 2015-10-28 DIAGNOSIS — R531 Weakness: Secondary | ICD-10-CM | POA: Diagnosis present

## 2015-10-28 LAB — CBC WITH DIFFERENTIAL/PLATELET
Basophils Absolute: 0 10*3/uL (ref 0.0–0.1)
Basophils Relative: 0 %
Eosinophils Absolute: 0 10*3/uL (ref 0.0–0.7)
Eosinophils Relative: 0 %
HCT: 38.8 % (ref 36.0–46.0)
Hemoglobin: 13.5 g/dL (ref 12.0–15.0)
Lymphocytes Relative: 12 %
Lymphs Abs: 0.8 10*3/uL (ref 0.7–4.0)
MCH: 33.5 pg (ref 26.0–34.0)
MCHC: 34.8 g/dL (ref 30.0–36.0)
MCV: 96.3 fL (ref 78.0–100.0)
Monocytes Absolute: 0.4 10*3/uL (ref 0.1–1.0)
Monocytes Relative: 6 %
Neutro Abs: 5.5 10*3/uL (ref 1.7–7.7)
Neutrophils Relative %: 82 %
Platelets: 220 10*3/uL (ref 150–400)
RBC: 4.03 MIL/uL (ref 3.87–5.11)
RDW: 13 % (ref 11.5–15.5)
WBC: 6.6 10*3/uL (ref 4.0–10.5)

## 2015-10-28 LAB — BASIC METABOLIC PANEL
Anion gap: 7 (ref 5–15)
BUN: 15 mg/dL (ref 6–20)
CO2: 27 mmol/L (ref 22–32)
Calcium: 9 mg/dL (ref 8.9–10.3)
Chloride: 98 mmol/L — ABNORMAL LOW (ref 101–111)
Creatinine, Ser: 0.69 mg/dL (ref 0.44–1.00)
GFR calc Af Amer: >60 mL/min (ref >60)
GFR calc non Af Amer: >60 mL/min (ref >60)
Glucose, Bld: 97 mg/dL (ref 65–99)
Potassium: 3.8 mmol/L (ref 3.5–5.1)
Sodium: 132 mmol/L — ABNORMAL LOW (ref 135–145)

## 2015-10-28 LAB — URINALYSIS, ROUTINE W REFLEX MICROSCOPIC
Bilirubin Urine: NEGATIVE
Glucose, UA: NEGATIVE mg/dL
Hgb urine dipstick: NEGATIVE
Ketones, ur: NEGATIVE mg/dL
Leukocytes, UA: NEGATIVE
Nitrite: NEGATIVE
Protein, ur: NEGATIVE mg/dL
Specific Gravity, Urine: 1.012 (ref 1.005–1.030)
pH: 7.5 (ref 5.0–8.0)

## 2015-10-28 MED ORDER — SODIUM CHLORIDE 0.9 % IV BOLUS (SEPSIS)
1000.0000 mL | Freq: Once | INTRAVENOUS | Status: AC
  Administered 2015-10-28: 1000 mL via INTRAVENOUS

## 2015-10-28 MED ORDER — ONDANSETRON HCL 4 MG/2ML IJ SOLN
4.0000 mg | Freq: Once | INTRAMUSCULAR | Status: AC
  Administered 2015-10-28: 4 mg via INTRAVENOUS
  Filled 2015-10-28: qty 2

## 2015-10-28 NOTE — Telephone Encounter (Signed)
Tamara Ortiz [facility] [phone]  [facility] Fax number [phone]   Attn:  [place]

## 2015-10-28 NOTE — ED Notes (Signed)
Per [facility] pt from home, reports weakness, Hx lung and brain cancer, last treatment 1 month ago. Per family pt has been confused today and weak. Per [facility] pt is alert and oriented x 4. HX dementia. Hx seizures and UTIs per ems pt has strong and foul odor urine.

## 2015-10-28 NOTE — ED Notes (Signed)
Tamara Ortiz; called up the Ed as the [facility] / pt contact family member requesting that ms. Tamara Ortiz pt 26, is kept for the night, the pt has no family at home, no assisted care ability, and will fall out of bed. She is working hard to get her into [place]/ living facility. [name] has an appointment for her to be assessed at 12:30am tomorrow to enter into [place].  Her contact number is [phone]

## 2015-10-28 NOTE — ED Notes (Signed)
Jacelyn Grip RN [facility] contacted the writer , stated that in case of pt's discharge to call friend or cousin , Levada Dy faxed contact numbers which are  in pt's room.

## 2015-10-28 NOTE — ED Notes (Signed)
Bed: CH85 Expected date:  Expected time:  Means of arrival:  Comments: EMS- 75yo F, [place] Pt/AMS/UTI?

## 2015-10-28 NOTE — Progress Notes (Addendum)
This patient was seen in [facility] ED on 01/18 for weakness and unsteady gait.  Patient was sent home with home health services for RN, PT, OT and aide.  Per chart review, it appears patient is receiving community assistance with placement into ALF. Patient reports she lives alone, has a walker at home.  Patient reports she can wash and dress herself.  She reports she has a son who lives in [place].  She reports she is ambulating with her walker but not well, she reports she is weak.   No further EDCM needs at this time.

## 2015-10-29 ENCOUNTER — Telehealth: Payer: Self-pay | Admitting: Internal Medicine

## 2015-10-29 ENCOUNTER — Telehealth: Payer: Self-pay

## 2015-10-29 LAB — URINE CULTURE: Culture: NO GROWTH

## 2015-10-29 NOTE — Telephone Encounter (Signed)
Is requesting call back in regards to notify that this is being completed so family can be notified.

## 2015-10-29 NOTE — Telephone Encounter (Signed)
i have not gotten an FL2 on this patient. i am calling emily to see if she can get an extension.

## 2015-10-29 NOTE — Telephone Encounter (Signed)
Tamara Ortiz a friend of pt is calling. Pt had craniotomy on 12/1. Pt is scheduled to see Dr Julien Nordmann on Monday as 1st appt since the surgery. There is nobody to take care of the patient at home. She is alone and scared and confused. The patient has fallen several times, she has lost items.  She said pt has called EMS several times since December and gone to ED, ED has not been able to keep her. EMS took her home from ED last night at 0200. [name] says they are waiting for some kind of paper from Dr Jenny Reichmann so pt can go to nursing facility. [name] is very upset. She is asking if there is anything we can do to help this situation. See notes from Dr Gwynn Burly office in EMR.

## 2015-10-29 NOTE — Telephone Encounter (Signed)
Check with Rachel Bo, he was completed paperwork prior to today, thanks

## 2015-10-29 NOTE — ED Provider Notes (Signed)
CSN: [id number]     Arrival date & time 10/28/15  1515 History   First MD Initiated Contact with Patient 10/28/15 1613     Chief Complaint  Patient presents with  . Weakness  . Altered Mental Status     (Consider location/radiation/quality/duration/timing/severity/associated sxs/prior Treatment) HPI   75 year old female with generalized weakness. Patient has a past history of metastatic cancer. She reportedly lives at home and has had increasing difficulty taking care of herself. She has a history of dementia but is answering questions appropriately from at this time. She has any acute pain. No dyspnea. Denies any nausea.  No specific urinary complaints.  Past Medical History  Diagnosis Date  . Hypertension   . HYPERLIPIDEMIA 11/05/2007  . ANXIETY 05/13/2009  . DEPRESSIVE DISORDER 05/06/2008  . HYPERTENSION 11/05/2007  . ALLERGIC RHINITIS 11/05/2007  . CONSTIPATION, CHRONIC 05/13/2009  . UTI 05/06/2008  . BACK PAIN 05/11/2008  . OSTEOPOROSIS 11/05/2007  . INSOMNIA-SLEEP DISORDER-UNSPEC 05/13/2009  . FATIGUE 05/13/2009  . Swelling, mass, or lump in chest 11/05/2007  . LUNG CANCER, HX OF 05/06/2008  . Neuropathy ([place]) 08/09/2015  . Arthritis   . 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 Lung cancer ([place])   . Brain cancer [facility])    Past Surgical History  Procedure Laterality Date  . Tubal ligation    . S/p lul lung surgury  12/2007  . Lobectomy    . Eye surgery      cataracts removed. IOL , bilateral   . Tonsillectomy    . Appendectomy    . Craniotomy Right 09/02/2015    Procedure: CRANIOTOMY TUMOR EXCISION w/Curve;  Surgeon: Consuella Lose, MD;  Location: [place] NEURO ORS;  Service: Neurosurgery;  Laterality: Right;  Right frontal stereotactic craniotomy for resection of tumor with brainlab  . Application of cranial navigation N/A 09/02/2015    Procedure: APPLICATION OF CRANIAL NAVIGATION;  Surgeon: Consuella Lose, MD;  Location: [place] NEURO ORS;  Service: Neurosurgery;   Laterality: N/A;  APPLICATION OF CRANIAL NAVIGATION   Family History  Problem Relation Age of Onset  . Heart disease Mother   . ALS Father   . Alcohol abuse Son     ETOH  . Cancer Neg Hx   . Colon cancer Neg Hx   . Esophageal cancer Neg Hx   . Stomach cancer Neg Hx   . Rectal cancer Neg Hx    Social History  Substance Use Topics  . Smoking status: Never Smoker   . Smokeless tobacco: Never Used  . Alcohol Use: Yes     Comment: occasionally   OB History    Gravida Para Term Preterm AB TAB SAB Ectopic Multiple Living   1 1             Review of Systems  All systems reviewed and negative, other than as noted in HPI.   Allergies  Amoxicillin; Levaquin; and Lorazepam  Home Medications   Prior to Admission medications   Medication Sig Start Date End Date Taking? Authorizing Provider  cephALEXin (KEFLEX) 500 MG capsule Take 1 capsule (500 mg total) by mouth 3 (three) times daily. 10/20/15  Yes Tatyana Kirichenko, PA-C  dexamethasone (DECADRON) 1 MG tablet Take 1 tablet (1 mg total) by mouth 2 (two) times daily. 09/08/15  Yes Consuella Lose, MD  folic acid (FOLVITE) 1 MG tablet TAKE 1 TABLET BY MOUTH DAILY 03/03/15  Yes Curt Bears, MD  hydrochlorothiazide (MICROZIDE) 12.5 MG capsule  Take 1 capsule (12.5 mg total) by mouth daily as needed. 10/14/15  Yes Biagio Borg, MD  HYDROcodone-acetaminophen (NORCO) 5-325 MG tablet Take 1 tablet by mouth every 6 (six) hours as needed for moderate pain. 08/18/15  Yes Curt Bears, MD  levETIRAcetam (KEPPRA) 500 MG tablet Take 1 tablet (500 mg total) by mouth 2 (two) times daily. 09/08/15  Yes Consuella Lose, MD  ondansetron (ZOFRAN ODT) 8 MG disintegrating tablet Take 1 tablet (8 mg total) by mouth every 8 (eight) hours as needed for nausea or vomiting. 06/22/15  Yes Biagio Borg, MD  zolpidem (AMBIEN) 5 MG tablet Take 1 tablet (5 mg total) by mouth at bedtime as needed for sleep. 10/14/15  Yes Biagio Borg, MD  Calcium Carbonate-Vitamin  D (CALCIUM-VITAMIN D) 500-200 MG-UNIT per tablet Take 1 tablet by mouth daily.     Historical Provider, MD  cholecalciferol (VITAMIN D) 1000 UNITS tablet Take 1,000 Units by mouth daily.    Historical Provider, MD  Multiple Vitamin (MULTIVITAMIN WITH MINERALS) TABS tablet Take 1 tablet by mouth daily. Women's One A Day    Historical Provider, MD  pantoprazole (PROTONIX) 40 MG tablet Take 1 tablet (40 mg total) by mouth daily. Patient not taking: Reported on 08/20/2015 06/22/15   Biagio Borg, MD  polyethylene glycol powder [facility]) powder Take 17 g by mouth daily as needed for moderate constipation.     Historical Provider, MD  prochlorperazine (COMPAZINE) 10 MG tablet Take 10 mg by mouth every 6 (six) hours as needed for nausea or vomiting.    Historical Provider, MD  vitamin C (ASCORBIC ACID) 500 MG tablet Take 500 mg by mouth daily.    Historical Provider, MD  zolendronic acid (ZOMETA) 4 MG/5ML injection Inject 4 mg into the vein every 30 (thirty) days.     Historical Provider, MD   BP 139/89 mmHg  Pulse 58  Temp(Src) 98.6 F (37 C) (Oral)  Resp 19  SpO2 97% Physical Exam  Constitutional: She appears well-developed and well-nourished. No distress.  Laying in bed. Frail appearing, but not distressed  HENT:  Head: Normocephalic and atraumatic.  Eyes: Conjunctivae are normal. Right eye exhibits no discharge. Left eye exhibits no discharge.  Neck: Neck supple.  Cardiovascular: Normal rate, regular rhythm and normal heart sounds.  Exam reveals no gallop and no friction rub.   No murmur heard. Pulmonary/Chest: Effort normal and breath sounds normal. No respiratory distress.  Abdominal: Soft. She exhibits no distension. There is no tenderness.  Musculoskeletal: She exhibits no edema or tenderness.  Neurological: She is alert.  r sided ptosis. CN 2-12 otherwise intact. No focal motor deficit.   Skin: Skin is warm and dry.  Psychiatric: She has a normal mood and affect. Her behavior is  normal. Thought content normal.  Nursing note and vitals reviewed.   ED Course  Procedures (including critical care time) Labs Review Labs Reviewed  BASIC METABOLIC PANEL - Abnormal; Notable for the following:    Sodium 132 (*)    Chloride 98 (*)    All other components within normal limits  URINALYSIS, ROUTINE W REFLEX MICROSCOPIC (NOT AT [facility]) - Abnormal; Notable for the following:    APPearance CLOUDY (*)    All other components within normal limits  URINE CULTURE  CBC WITH DIFFERENTIAL/PLATELET    Imaging Review Dg Chest 2 View  10/28/2015  CLINICAL DATA:  Weakness today. History of lung car scratch the history of lung carcinoma with metastatic disease to the  brain and bones. Initial encounter. EXAM: CHEST  2 VIEW COMPARISON:  PA and lateral chest 08/13/2015 and 10/20/2015. CT chest, abdomen and pelvis 06/09/2015. FINDINGS: The patient is slightly rotated on the PA view. No consolidative process, pneumothorax or effusion is identified. Heart size is normal. Postoperative change left chest is noted. IMPRESSION: No acute disease. Electronically Signed   By: Inge Rise M.D.   On: 10/28/2015 19:00   Ct Head Wo Contrast  10/28/2015  CLINICAL DATA:  Weakness and confusion today. History of lung and brain cancer, last treatment 1 month ago. History of dementia. History of seizures. Altered mental status. EXAM: CT HEAD WITHOUT CONTRAST TECHNIQUE: Contiguous axial images were obtained from the base of the skull through the vertex without intravenous contrast. COMPARISON:  MRI brain 09/03/2015.  CT head 08/13/2015. FINDINGS: Postoperative changes with right rib frontal craniotomy and resection of the metastasis is demonstrated in the right frontal lobe on the prior study. There is no definite evidence of any residual or recurrent metastatic disease although MRI would be more sensitive for detection of metastatic changes. Small metastases seen on the prior MRI are not demonstrated on  noncontrast head CT. Mild diffuse cerebral atrophy. Ventricular dilatation consistent with central atrophy. Low-attenuation changes throughout the deep white matter likely representing small vessel ischemia. No abnormal extra-axial fluid collections. No mass effect or midline shift. Gray-white matter junctions are distinct. Basal cisterns are not effaced. No evidence of acute intracranial hemorrhage. Vascular calcifications. Visualized paranasal sinuses and mastoid air cells are not opacified. Calvarium appears intact other than postsurgical changes. IMPRESSION: No acute intracranial abnormalities. Chronic atrophy and small vessel ischemic changes. No mass effect or midline shift. No metastasis is demonstrated although MRI would be more sensitive for detection of intracranial metastasis. Known metastatic deposits seen on prior MRI are not visualized on noncontrast head CT. Electronically Signed   By: Lucienne Capers M.D.   On: 10/28/2015 21:22   I have personally reviewed and evaluated these images and lab results as part of my medical decision-making.   EKG Interpretation None      MDM   Final diagnoses:  Nausea  Physical deconditioning    75 year old female with nausea and generalized weakness.  My impression is that this is primarily deconditioning.  Unfortunately, patient lives alone.  There does not appear to be an acute medical condition requiring admission to the hospital though.  Social work reviewed her case. She has home health services in place already. Discussed with one of patient's friends, Cato Mulligan. She reports that patient has scheduled assessment tomorrow at 12:30 for possible placement at [facility]. Unhappy that not going to be admitted. Her concerns arelegitimate but there is no medical indication to do so at this time.    Virgel Manifold, MD 11/03/15 [phone]

## 2015-10-29 NOTE — Telephone Encounter (Signed)
Do you know anything about this? I can fill out form if needed. Please advise

## 2015-10-29 NOTE — Telephone Encounter (Signed)
Faxed over FL2 last week for patient.  Patients needs assisted living with [facility].  Today there will be an assessment at 1pm.  Patient can not be by herself over the weekend.  Facility needs FL2 before then.  FL2 needs to be faxed to [phone].  Attn Perrieha .  States has been trying to get FL2 all week.

## 2015-11-01 ENCOUNTER — Other Ambulatory Visit (HOSPITAL_BASED_OUTPATIENT_CLINIC_OR_DEPARTMENT_OTHER): Payer: Medicare Other

## 2015-11-01 ENCOUNTER — Ambulatory Visit (HOSPITAL_BASED_OUTPATIENT_CLINIC_OR_DEPARTMENT_OTHER): Payer: Medicare Other | Admitting: Internal Medicine

## 2015-11-01 ENCOUNTER — Encounter: Payer: Self-pay | Admitting: General Practice

## 2015-11-01 ENCOUNTER — Encounter: Payer: Self-pay | Admitting: Internal Medicine

## 2015-11-01 ENCOUNTER — Ambulatory Visit: Payer: Medicare Other

## 2015-11-01 VITALS — BP 127/85 | HR 84 | Temp 98.8°F | Resp 18 | Ht 65.0 in | Wt 116.0 lb

## 2015-11-01 DIAGNOSIS — C7951 Secondary malignant neoplasm of bone: Secondary | ICD-10-CM

## 2015-11-01 DIAGNOSIS — C7931 Secondary malignant neoplasm of brain: Secondary | ICD-10-CM

## 2015-11-01 DIAGNOSIS — R5383 Other fatigue: Secondary | ICD-10-CM | POA: Diagnosis not present

## 2015-11-01 DIAGNOSIS — C3490 Malignant neoplasm of unspecified part of unspecified bronchus or lung: Secondary | ICD-10-CM | POA: Diagnosis present

## 2015-11-01 DIAGNOSIS — G629 Polyneuropathy, unspecified: Secondary | ICD-10-CM

## 2015-11-01 DIAGNOSIS — C3491 Malignant neoplasm of unspecified part of right bronchus or lung: Secondary | ICD-10-CM

## 2015-11-01 DIAGNOSIS — C3492 Malignant neoplasm of unspecified part of left bronchus or lung: Secondary | ICD-10-CM

## 2015-11-01 LAB — COMPREHENSIVE METABOLIC PANEL
ALT: 24 U/L (ref 0–55)
AST: 16 U/L (ref 5–34)
Albumin: 3.5 g/dL (ref 3.5–5.0)
Alkaline Phosphatase: 77 U/L (ref 40–150)
Anion Gap: 13 mEq/L — ABNORMAL HIGH (ref 3–11)
BILIRUBIN TOTAL: 0.55 mg/dL (ref 0.20–1.20)
BUN: 14.2 mg/dL (ref 7.0–26.0)
CHLORIDE: 100 meq/L (ref 98–109)
CO2: 23 meq/L (ref 22–29)
Calcium: 9 mg/dL (ref 8.4–10.4)
Creatinine: 0.9 mg/dL (ref 0.6–1.1)
EGFR: 62 mL/min/{1.73_m2} — AB (ref >90)
GLUCOSE: 136 mg/dL (ref 70–140)
Potassium: 3.6 mEq/L (ref 3.5–5.1)
SODIUM: 136 meq/L (ref 136–145)
TOTAL PROTEIN: 7 g/dL (ref 6.4–8.3)

## 2015-11-01 LAB — CBC WITH DIFFERENTIAL/PLATELET
BASO%: 0.1 % (ref 0.0–2.0)
Basophils Absolute: 0 10*3/uL (ref 0.0–0.1)
EOS%: 0 % (ref 0.0–7.0)
Eosinophils Absolute: 0 10*3/uL (ref 0.0–0.5)
HCT: 44 % (ref 34.8–46.6)
HGB: 14.9 g/dL (ref 11.6–15.9)
LYMPH%: 8.2 % — AB (ref 14.0–49.7)
MCH: 32.8 pg (ref 25.1–34.0)
MCHC: 33.9 g/dL (ref 31.5–36.0)
MCV: 96.8 fL (ref 79.5–101.0)
MONO#: 0.2 10*3/uL (ref 0.1–0.9)
MONO%: 2.4 % (ref 0.0–14.0)
NEUT%: 89.3 % — ABNORMAL HIGH (ref 38.4–76.8)
NEUTROS ABS: 5.6 10*3/uL (ref 1.5–6.5)
Platelets: 270 10*3/uL (ref 145–400)
RBC: 4.54 10*6/uL (ref 3.70–5.45)
RDW: 13.7 % (ref 11.2–14.5)
WBC: 6.3 10*3/uL (ref 3.9–10.3)
lymph#: 0.5 10*3/uL — ABNORMAL LOW (ref 0.9–3.3)

## 2015-11-01 NOTE — Progress Notes (Signed)
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   PRIOR THERAPY:  1. Status post left upper lobectomy with mediastinal lymph node dissection under the care of Dr. Roxan Hockey on December 10, 2007.  2. Status post 4 cycles of adjuvant chemotherapy with cisplatin and Taxotere. Last dose was given 03/22/2008.  3. Status post 6 cycles of systemic chemotherapy with carboplatin and Alimta for metastatic disease in the bone. The last dose was given July 02, 2010, with stable disease.  4. Status post 12 cycles of maintenance Alimta at 500 mg/sq m given every 3 weeks. The last dose was given 05/08/2011. 5. Stereotactic radiotherapy to metastatic brain lesions under the care of Dr. Tammi Klippel completed on 07/30/2014.  CURRENT THERAPY:  1. Maintenance Alimta at 500 mg/sq m given every 4 weeks. The patient is status post 52 cycles.  2. Zometa 4 mg IV given every 2 months for bone metastasis.   CHEMOTHERAPY INTENT: Palliative  CURRENT # OF CHEMOTHERAPY CYCLES: 52 CURRENT ANTIEMETICS: none  CURRENT SMOKING STATUS: Never smoker   ORAL CHEMOTHERAPY AND CONSENT: n/a  CURRENT BISPHOSPHONATES USE: Yes, Zometa given every 2 months  PAIN MANAGEMENT: [facility]  NARCOTICS INDUCED CONSTIPATION: Occasional, uses MiraLax  LIVING WILL AND CODE STATUS: Has a living will   INTERVAL HISTORY: Tamara Ortiz 75 y.o. female returns to the clinic today for followup visit accompanied by friend. The patient has significant weakness and fatigue as well as confusion. She underwent stereotactic radiotherapy followed by right frontal craniotomy on 09/02/2015. The final pathology showed no viable tumor. She  had increasing weakness and confusion since her surgery. She also has few episodes of nausea every now and then. She has been off chemotherapy for the last 2 months. She requires 24-hour care at home. She denied having any significant weight loss or night sweats. She has no chest pain, shortness of breath, cough or hemoptysis. She has no fever or chills. She is tolerating her previous treatment with Alimta fairly well with no significant adverse effects. She is here today for reevaluation before resuming her chemotherapy.  MEDICAL HISTORY: Past Medical History  Diagnosis Date  . Hypertension   . HYPERLIPIDEMIA 11/05/2007  . ANXIETY 05/13/2009  . DEPRESSIVE DISORDER 05/06/2008  . HYPERTENSION 11/05/2007  . ALLERGIC RHINITIS 11/05/2007  . CONSTIPATION, CHRONIC 05/13/2009  . UTI 05/06/2008  . BACK PAIN 05/11/2008  . OSTEOPOROSIS 11/05/2007  . INSOMNIA-SLEEP DISORDER-UNSPEC 05/13/2009  . FATIGUE 05/13/2009  . Swelling, mass, or lump in chest 11/05/2007  . LUNG CANCER, HX OF 05/06/2008  . Neuropathy ([place]) 08/09/2015  . Arthritis   . 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 Lung cancer ([place])   . Brain cancer ([place])     ALLERGIES:  is allergic to amoxicillin; levaquin; and lorazepam.  MEDICATIONS:  Current Outpatient Prescriptions  Medication Sig Dispense Refill  . Calcium Carbonate-Vitamin D (CALCIUM-VITAMIN D) 500-200 MG-UNIT per tablet Take 1 tablet by mouth daily.     . cephALEXin (KEFLEX) 500 MG capsule Take 1 capsule (500 mg total) by mouth 3 (three) times daily. 21 capsule 0  . cholecalciferol (VITAMIN D) 1000 UNITS tablet Take 1,000  Units by mouth daily.    [name] dexamethasone (DECADRON) 1 MG tablet Take 1 tablet (1 mg total) by mouth 2 (two) times daily. 10 tablet 0  . folic acid (FOLVITE) 1 MG tablet TAKE 1 TABLET BY MOUTH DAILY 90 tablet 0  . hydrochlorothiazide (MICROZIDE) 12.5 MG capsule Take 1 capsule (12.5 mg total) by mouth daily as needed. 30 capsule 11  .  HYDROcodone-acetaminophen (NORCO) 5-325 MG tablet Take 1 tablet by mouth every 6 (six) hours as needed for moderate pain. 30 tablet 0  . levETIRAcetam (KEPPRA) 500 MG tablet Take 1 tablet (500 mg total) by mouth 2 (two) times daily. 60 tablet 0  . Multiple Vitamin (MULTIVITAMIN WITH MINERALS) TABS tablet Take 1 tablet by mouth daily. Women's One A Day    . ondansetron (ZOFRAN ODT) 8 MG disintegrating tablet Take 1 tablet (8 mg total) by mouth every 8 (eight) hours as needed for nausea or vomiting. 30 tablet 1  . pantoprazole (PROTONIX) 40 MG tablet Take 1 tablet (40 mg total) by mouth daily. (Patient not taking: Reported on 08/20/2015) 90 tablet 3  . polyethylene glycol powder (MIRALAX) powder Take 17 g by mouth daily as needed for moderate constipation.     . prochlorperazine (COMPAZINE) 10 MG tablet Take 10 mg by mouth every 6 (six) hours as needed for nausea or vomiting.    . vitamin C (ASCORBIC ACID) 500 MG tablet Take 500 mg by mouth daily.    [name] zolendronic acid (ZOMETA) 4 MG/5ML injection Inject 4 mg into the vein every 30 (thirty) days.     [name] zolpidem (AMBIEN) 5 MG tablet Take 1 tablet (5 mg total) by mouth at bedtime as needed for sleep. 90 tablet 1   No current facility-administered medications for this visit.    SURGICAL HISTORY:  Past Surgical History  Procedure Laterality Date  . Tubal ligation    . S/p lul lung surgury  12/2007  . Lobectomy    . Eye surgery      cataracts removed. IOL , bilateral   . Tonsillectomy    . Appendectomy    . Craniotomy Right 09/02/2015    Procedure: CRANIOTOMY TUMOR EXCISION w/Curve;  Surgeon: Consuella Lose, MD;  Location: [facility];  Service: Neurosurgery;  Laterality: Right;  Right frontal stereotactic craniotomy for resection of tumor with brainlab  . Application of cranial navigation N/A 09/02/2015    Procedure: APPLICATION OF CRANIAL NAVIGATION;  Surgeon: Consuella Lose, MD;  Location: [facility];  Service: Neurosurgery;  Laterality:  N/A;  APPLICATION OF CRANIAL NAVIGATION    REVIEW OF SYSTEMS:  A comprehensive review of systems was negative except for: Constitutional: positive for fatigue Neurological: positive for memory problems   PHYSICAL EXAMINATION: General appearance: alert, cooperative and no distress Head: Normocephalic, without obvious abnormality, atraumatic Neck: no adenopathy Lymph nodes: Cervical, supraclavicular, and axillary nodes normal. Resp: clear to auscultation bilaterally Back: symmetric, no curvature. ROM normal. No CVA tenderness. Cardio: regular rate and rhythm, S1, S2 normal, no murmur, click, rub or gallop GI: soft, non-tender; bowel sounds normal; no masses,  no organomegaly Extremities: extremities normal, atraumatic, no cyanosis or edema Neurologic: Alert and oriented X 3, normal strength and tone. Normal symmetric reflexes. Normal coordination and gait  ECOG PERFORMANCE STATUS: 1 - Symptomatic but completely ambulatory  Blood pressure 127/85, pulse 84, temperature 98.8 F (37.1 C), temperature source Oral, resp. rate 18, height '5\' 5"'  (1.651 m), weight 116 lb (52.617 kg), SpO2 100 %.  LABORATORY DATA:  Lab Results  Component Value Date   WBC 6.3 11/01/2015   HGB 14.9 11/01/2015   HCT 44.0 11/01/2015   MCV 96.8 11/01/2015   PLT 270 11/01/2015      Chemistry      Component Value Date/Time   NA 132* 10/28/2015 1634   NA 140 08/09/2015 0941   NA 140 04/17/2012 0855   K 3.8 10/28/2015 1634   K 3.8 08/09/2015 0941   K 4.1 04/17/2012 0855   CL 98* 10/28/2015 1634   CL 100 03/04/2013 0901   CL 99 04/17/2012 0855   CO2 27 10/28/2015 1634   CO2 25 08/09/2015 0941   CO2 29 04/17/2012 0855   BUN 15 10/28/2015 1634   BUN 18.5 08/09/2015 0941   BUN 15 04/17/2012 0855   CREATININE 0.69 10/28/2015 1634   CREATININE 1.1 08/09/2015 0941   CREATININE 1.4* 04/17/2012 0855      Component Value Date/Time   CALCIUM 9.0 10/28/2015 1634   CALCIUM 9.6 08/09/2015 0941   CALCIUM 9.5  04/17/2012 0855   ALKPHOS 80 10/20/2015 1545   ALKPHOS 73 07/12/2015 0946   ALKPHOS 68 04/17/2012 0855   AST 21 10/20/2015 1545   AST 20 07/12/2015 0946   AST 29 04/17/2012 0855   ALT 26 10/20/2015 1545   ALT 13 07/12/2015 0946   ALT 31 04/17/2012 0855   BILITOT 0.6 10/20/2015 1545   BILITOT 0.49 07/12/2015 0946   BILITOT 0.60 04/17/2012 0855       RADIOGRAPHIC STUDIES: Dg Chest 2 View  10/28/2015  CLINICAL DATA:  Weakness today. History of lung car scratch the history of lung carcinoma with metastatic disease to the brain and bones. Initial encounter. EXAM: CHEST  2 VIEW COMPARISON:  PA and lateral chest 08/13/2015 and 10/20/2015. CT chest, abdomen and pelvis 06/09/2015. FINDINGS: The patient is slightly rotated on the PA view. No consolidative process, pneumothorax or effusion is identified. Heart size is normal. Postoperative change left chest is noted. IMPRESSION: No acute disease. Electronically Signed   By: Inge Rise M.D.   On: 10/28/2015 19:00   Dg Chest 2 View  10/20/2015  CLINICAL DATA:  75 year old female with leg swelling and weakness for the past several weeks. EXAM: CHEST  2 VIEW COMPARISON:  Chest x-ray 08/13/2015. FINDINGS: Status post left upper lobectomy. Compensatory hyperexpansion of the left lower lobe. No consolidative airspace disease. No pleural effusions. No pneumothorax. No pulmonary nodule or mass noted. Pulmonary vasculature and the cardiomediastinal silhouette are within normal limits. IMPRESSION: 1.  No radiographic evidence of acute cardiopulmonary disease. 2. Status post left upper lobectomy. Electronically Signed   By: Vinnie Langton M.D.   On: 10/20/2015 20:22   Ct Head Wo Contrast  10/28/2015  CLINICAL DATA:  Weakness and confusion today. History of lung and brain cancer, last treatment 1 month ago. History of dementia. History of seizures. Altered mental status. EXAM: CT HEAD WITHOUT CONTRAST TECHNIQUE: Contiguous axial images were obtained from  the base of the skull through the vertex without intravenous contrast. COMPARISON:  MRI brain 09/03/2015.  CT head 08/13/2015. FINDINGS: Postoperative changes with right rib frontal craniotomy and resection of the metastasis is demonstrated in the right frontal lobe on the prior study. There is no definite evidence of any residual or recurrent metastatic disease although MRI would be more sensitive for detection of metastatic changes. Small metastases seen on the prior MRI are not demonstrated on noncontrast head CT. Mild diffuse cerebral atrophy. Ventricular dilatation consistent with central atrophy.  Low-attenuation changes throughout the deep white matter likely representing small vessel ischemia. No abnormal extra-axial fluid collections. No mass effect or midline shift. Gray-white matter junctions are distinct. Basal cisterns are not effaced. No evidence of acute intracranial hemorrhage. Vascular calcifications. Visualized paranasal sinuses and mastoid air cells are not opacified. Calvarium appears intact other than postsurgical changes. IMPRESSION: No acute intracranial abnormalities. Chronic atrophy and small vessel ischemic changes. No mass effect or midline shift. No metastasis is demonstrated although MRI would be more sensitive for detection of intracranial metastasis. Known metastatic deposits seen on prior MRI are not visualized on noncontrast head CT. Electronically Signed   By: Lucienne Capers M.D.   On: 10/28/2015 21:22    ASSESSMENT AND PLAN: This is a very pleasant 75 years old white female with recurrent non-small cell lung cancer, adenocarcinoma. The patient is currently on maintenance Alimta monthly status post total of 52 cycles.  The patient has been off treatment for the last 2 months. She has significant fatigue and weakness. She may require admission to skilled nursing facility for further rehabilitation. Her FL 2 form is at the primary care physician's office. We'll contact them to  speed the process. I recommended for the patient to hold her treatment for now until her performance is status improves. I will see her back for follow-up visit in one month's for reevaluation after repeating CT scan of the chest, abdomen and pelvis for restaging of her disease The patient was advised to call immediately if she has any concerning symptoms in the interval. The patient voices understanding of current disease status and treatment options and is in agreement with the current care plan.  All questions were answered. The patient knows to call the clinic with any problems, questions or concerns. We can certainly see the patient much sooner if necessary.  Disclaimer: This note was dictated with voice recognition software. Similar sounding words can inadvertently be transcribed and may not be corrected upon review.

## 2015-11-01 NOTE — Progress Notes (Signed)
Spiritual Care Note  Spoke with Makinley and her friend [name] in the lobby while Diane Bell/RN assisted per her note.  Kajol appeared weak, with distressed affect and very quiet, hesitant speaking voice.  She verbalized that she "feels terrible" physically and is "anxious" about FL-2 form/disposition.  Provided emotional support, joining her in prayer that she initiated.  Consulted with SW re FL-2/disposition needs.  Will also update counseling intern Vaughan Sine, who has been following pt.  [place], [place], [facility] Pager [phone] Voicemail  [phone]

## 2015-11-01 NOTE — Progress Notes (Signed)
Pt disposition unsettled . Pt waiting on FL-2 Dr Jenny Reichmann to go to [place]. Per her friend [name] , [facility] is picking pt up at cancer center and taking her home and providing 24 hour atc care. Her other friend [name] is waiting with her for transportation.

## 2015-11-03 ENCOUNTER — Telehealth: Payer: Self-pay | Admitting: Internal Medicine

## 2015-11-03 NOTE — Telephone Encounter (Signed)
Left message for patient for appointments on 3/1 and 3/7 per 1/30 pof.

## 2015-11-05 ENCOUNTER — Telehealth: Payer: Self-pay | Admitting: Internal Medicine

## 2015-11-05 DIAGNOSIS — C7951 Secondary malignant neoplasm of bone: Secondary | ICD-10-CM

## 2015-11-05 DIAGNOSIS — C3492 Malignant neoplasm of unspecified part of left bronchus or lung: Secondary | ICD-10-CM

## 2015-11-05 MED ORDER — HYDROCODONE-ACETAMINOPHEN 5-325 MG PO TABS: 1.0000 | ORAL_TABLET | Freq: Four times a day (QID) | ORAL | Status: DC | PRN

## 2015-11-05 MED ORDER — ZOLPIDEM TARTRATE 5 MG PO TABS: 5.0000 mg | ORAL_TABLET | Freq: Every evening | ORAL | Status: AC | PRN

## 2015-11-05 NOTE — Telephone Encounter (Signed)
Done hardcopy to Corinne = both

## 2015-11-05 NOTE — Telephone Encounter (Signed)
Called patient to let her know that her prescriptions are ready to pick up in the front office.

## 2015-11-10 ENCOUNTER — Telehealth: Payer: Self-pay | Admitting: Internal Medicine

## 2015-11-10 DIAGNOSIS — R300 Dysuria: Secondary | ICD-10-CM

## 2015-11-10 NOTE — Telephone Encounter (Signed)
Order done for [facility]

## 2015-11-10 NOTE — Telephone Encounter (Signed)
[place] from [facility] came by to pick up pt's scripts and requested an order be put in for a UAC&S.  Pt has been yelling out & complaining of burning while urinating.  If any questions, call [facility] at [phone], ask for [place] or [place].

## 2015-11-12 ENCOUNTER — Telehealth: Payer: Self-pay | Admitting: Internal Medicine

## 2015-11-12 MED ORDER — NITROFURANTOIN MONOHYD MACRO 100 MG PO CAPS: 100.0000 mg | ORAL_CAPSULE | Freq: Two times a day (BID) | ORAL | Status: DC

## 2015-11-12 NOTE — Telephone Encounter (Signed)
[place] for [employer] course - done erx

## 2015-11-12 NOTE — Telephone Encounter (Signed)
Tamara Ortiz has called stating pt has strong urine order and odd behavior. She did not take her medicine properly last time and they are requesting order to restart antibiotics.  Phone # [phone]  Fax # [phone]

## 2015-11-12 NOTE — Telephone Encounter (Signed)
Thanks that patient might have UTI.  Is visiting patient at 11:30am this morning if you would like a urine specimen collected.  Patient is having burning with urination at increased confusion.  Please follow up in regards.  FYI:  Patient has moved to [place].

## 2015-11-14 ENCOUNTER — Emergency Department (HOSPITAL_COMMUNITY)
Admission: EM | Admit: 2015-11-14 | Discharge: 2015-11-15 | Disposition: A | Discharge status: Home / Self Care | Payer: Medicare Other | Attending: Emergency Medicine | Admitting: Emergency Medicine

## 2015-11-14 ENCOUNTER — Encounter (HOSPITAL_COMMUNITY): Payer: Self-pay | Admitting: *Deleted

## 2015-11-14 ENCOUNTER — Emergency Department (HOSPITAL_COMMUNITY): Payer: Medicare Other

## 2015-11-14 DIAGNOSIS — K5900 Constipation, unspecified: Secondary | ICD-10-CM | POA: Insufficient documentation

## 2015-11-14 DIAGNOSIS — Z85118 Personal history of other malignant neoplasm of bronchus and lung: Secondary | ICD-10-CM | POA: Insufficient documentation

## 2015-11-14 DIAGNOSIS — M1990 Unspecified osteoarthritis, unspecified site: Secondary | ICD-10-CM | POA: Diagnosis not present

## 2015-11-14 DIAGNOSIS — Z85841 Personal history of malignant neoplasm of brain: Secondary | ICD-10-CM | POA: Insufficient documentation

## 2015-11-14 DIAGNOSIS — Z85830 Personal history of malignant neoplasm of bone: Secondary | ICD-10-CM | POA: Insufficient documentation

## 2015-11-14 DIAGNOSIS — F0390 Unspecified dementia without behavioral disturbance: Secondary | ICD-10-CM | POA: Diagnosis not present

## 2015-11-14 DIAGNOSIS — R410 Disorientation, unspecified: Secondary | ICD-10-CM | POA: Diagnosis not present

## 2015-11-14 DIAGNOSIS — N390 Urinary tract infection, site not specified: Secondary | ICD-10-CM | POA: Diagnosis not present

## 2015-11-14 DIAGNOSIS — I10 Essential (primary) hypertension: Secondary | ICD-10-CM | POA: Diagnosis not present

## 2015-11-14 DIAGNOSIS — R4182 Altered mental status, unspecified: Secondary | ICD-10-CM | POA: Diagnosis present

## 2015-11-14 DIAGNOSIS — Z8639 Personal history of other endocrine, nutritional and metabolic disease: Secondary | ICD-10-CM | POA: Diagnosis not present

## 2015-11-14 DIAGNOSIS — Z880 Allergy status to penicillin: Secondary | ICD-10-CM | POA: Insufficient documentation

## 2015-11-14 DIAGNOSIS — Z8659 Personal history of other mental and behavioral disorders: Secondary | ICD-10-CM | POA: Insufficient documentation

## 2015-11-14 DIAGNOSIS — Z79899 Other long term (current) drug therapy: Secondary | ICD-10-CM | POA: Diagnosis not present

## 2015-11-14 DIAGNOSIS — G4700 Insomnia, unspecified: Secondary | ICD-10-CM | POA: Insufficient documentation

## 2015-11-14 LAB — I-STAT CHEM 8, ED
BUN: 22 mg/dL — ABNORMAL HIGH (ref 6–20)
Calcium, Ion: 1.1 mmol/L — ABNORMAL LOW (ref 1.13–1.30)
Chloride: 93 mmol/L — ABNORMAL LOW (ref 101–111)
Creatinine, Ser: 0.8 mg/dL (ref 0.44–1.00)
Glucose, Bld: 102 mg/dL — ABNORMAL HIGH (ref 65–99)
HEMATOCRIT: 43 % (ref 36.0–46.0)
HEMOGLOBIN: 14.6 g/dL (ref 12.0–15.0)
POTASSIUM: 3.2 mmol/L — AB (ref 3.5–5.1)
SODIUM: 134 mmol/L — AB (ref 135–145)
TCO2: 31 mmol/L (ref 0–100)

## 2015-11-14 LAB — URINE MICROSCOPIC-ADD ON

## 2015-11-14 LAB — URINALYSIS, ROUTINE W REFLEX MICROSCOPIC
Bilirubin Urine: NEGATIVE
Glucose, UA: NEGATIVE mg/dL
Hgb urine dipstick: NEGATIVE
KETONES UR: NEGATIVE mg/dL
NITRITE: POSITIVE — AB
PROTEIN: NEGATIVE mg/dL
Specific Gravity, Urine: 1.017 (ref 1.005–1.030)
pH: 7 (ref 5.0–8.0)

## 2015-11-14 NOTE — ED Provider Notes (Signed)
CSN: [id number]     Arrival date & time 11/14/15  1842 History   First MD Initiated Contact with Patient 11/14/15 1844     Chief Complaint  Patient presents with  . Altered Mental Status     (Consider location/radiation/quality/duration/timing/severity/associated sxs/prior Treatment) Patient is a 75 y.o. female presenting with altered mental status. The history is provided by the EMS personnel. The history is limited by the condition of the patient.  Altered Mental Status Presenting symptoms: behavior changes   Presenting symptoms comment:  Yelling out Severity:  Moderate Most recent episode: Several days. Episode history:  Multiple Timing:  Intermittent Progression:  Waxing and waning Chronicity:  New Context: dementia   Context comment:  Status post brain tumor resection currently in a nursing skilled facility. Patient is currently being treated with urinary tract infection with Macrobid   Past Medical History  Diagnosis Date  . Hypertension   . HYPERLIPIDEMIA 11/05/2007  . ANXIETY 05/13/2009  . DEPRESSIVE DISORDER 05/06/2008  . HYPERTENSION 11/05/2007  . ALLERGIC RHINITIS 11/05/2007  . CONSTIPATION, CHRONIC 05/13/2009  . UTI 05/06/2008  . BACK PAIN 05/11/2008  . OSTEOPOROSIS 11/05/2007  . INSOMNIA-SLEEP DISORDER-UNSPEC 05/13/2009  . FATIGUE 05/13/2009  . Swelling, mass, or lump in chest 11/05/2007  . LUNG CANCER, HX OF 05/06/2008  . Neuropathy ([place]) 08/09/2015  . Arthritis   . 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 Lung cancer ([place])   . Brain cancer [facility])    Past Surgical History  Procedure Laterality Date  . Tubal ligation    . S/p lul lung surgury  12/2007  . Lobectomy    . Eye surgery      cataracts removed. IOL , bilateral   . Tonsillectomy    . Appendectomy    . Craniotomy Right 09/02/2015    Procedure: CRANIOTOMY TUMOR EXCISION w/Curve;  Surgeon: Consuella Lose, MD;  Location: [place] [facility]URO ORS;  Service: Neurosurgery;  Laterality: Right;  Right frontal  stereotactic craniotomy for resection of tumor with brainlab  . Application of cranial navigation N/A 09/02/2015    Procedure: APPLICATION OF CRANIAL NAVIGATION;  Surgeon: Consuella Lose, MD;  Location: [facility] NEURO ORS;  Service: Neurosurgery;  Laterality: N/A;  APPLICATION OF CRANIAL NAVIGATION   Family History  Problem Relation Age of Onset  . Heart disease Mother   . ALS Father   . Alcohol abuse Son     ETOH  . Cancer Neg Hx   . Colon cancer Neg Hx   . Esophageal cancer Neg Hx   . Stomach cancer Neg Hx   . Rectal cancer Neg Hx    Social History  Substance Use Topics  . Smoking status: Never Smoker   . Smokeless tobacco: Never Used  . Alcohol Use: Yes     Comment: occasionally   OB History    Gravida Para Term Preterm AB TAB SAB Ectopic Multiple Living   1 1             Review of Systems  Unable to perform ROS: Mental status change  All other systems reviewed and are negative.     Allergies  Amoxicillin; Levaquin; and Lorazepam  Home Medications   Prior to Admission medications   Medication Sig Start Date End Date Taking? Authorizing Provider  Calcium Carbonate-Vitamin D (CALCIUM-VITAMIN D) 500-200 MG-UNIT per tablet Take 1 tablet by mouth daily.    Yes Historical Provider, MD  cholecalciferol (VITAMIN D) 1000 UNITS tablet Take 1,000  Units by mouth daily.   Yes Historical Provider, MD  dexamethasone (DECADRON) 1 MG tablet Take 1 tablet (1 mg total) by mouth 2 (two) times daily. 09/08/15  Yes Consuella Lose, MD  folic acid (FOLVITE) 1 MG tablet TAKE 1 TABLET BY MOUTH DAILY 03/03/15  Yes Curt Bears, MD  hydrochlorothiazide (MICROZIDE) 12.5 MG capsule Take 1 capsule (12.5 mg total) by mouth daily as needed. Patient taking differently: Take 12.5 mg by mouth daily.  10/14/15  Yes Biagio Borg, MD  HYDROcodone-acetaminophen (NORCO) 5-325 MG tablet Take 1 tablet by mouth every 6 (six) hours as needed for moderate pain. 11/05/15  Yes Biagio Borg, MD  levETIRAcetam  (KEPPRA) 500 MG tablet Take 1 tablet (500 mg total) by mouth 2 (two) times daily. 09/08/15  Yes Consuella Lose, MD  Multiple Vitamin (MULTIVITAMIN WITH MINERALS) TABS tablet Take 1 tablet by mouth daily. Women's One A Day   Yes Historical Provider, MD  ondansetron (ZOFRAN ODT) 8 MG disintegrating tablet Take 1 tablet (8 mg total) by mouth every 8 (eight) hours as needed for nausea or vomiting. 06/22/15  Yes Biagio Borg, MD  pantoprazole (PROTONIX) 40 MG tablet Take 1 tablet (40 mg total) by mouth daily. 06/22/15  Yes Biagio Borg, MD  polyethylene glycol powder [facility]) powder Take 17 g by mouth daily as needed for moderate constipation.    Yes Historical Provider, MD  prochlorperazine (COMPAZINE) 10 MG tablet Take 10 mg by mouth every 6 (six) hours as needed for nausea or vomiting.   Yes Historical Provider, MD  vitamin C (ASCORBIC ACID) 500 MG tablet Take 500 mg by mouth daily.   Yes Historical Provider, MD  zolpidem (AMBIEN) 5 MG tablet Take 1 tablet (5 mg total) by mouth at bedtime as needed for sleep. 11/05/15  Yes Biagio Borg, MD  cephALEXin (KEFLEX) 500 MG capsule Take 500 mg by mouth 3 (three) times daily. Reported on 11/14/2015 11/01/15   Historical Provider, MD  nitrofurantoin, macrocrystal-monohydrate, (MACROBID) 100 MG capsule Take 1 capsule (100 mg total) by mouth 2 (two) times daily. Patient not taking: Reported on 11/14/2015 11/12/15   Biagio Borg, MD   BP 140/112 mmHg  Pulse 67  Temp(Src) 98.5 F (36.9 C) (Oral)  Resp 15  SpO2 100% Physical Exam  Constitutional: She appears well-developed and well-nourished. No distress.  HENT:  Head: Normocephalic and atraumatic.  Right Ear: External ear normal.  Left Ear: External ear normal.  Nose: Nose normal.  Eyes: Conjunctivae and EOM are normal. Pupils are equal, round, and reactive to light. Right eye exhibits no discharge. Left eye exhibits no discharge. No scleral icterus.  Neck: Normal range of motion. Neck supple.   Cardiovascular: Normal rate, regular rhythm and normal heart sounds.  Exam reveals no gallop and no friction rub.   No murmur heard. Pulmonary/Chest: Effort normal and breath sounds normal. No stridor. No respiratory distress. She has no wheezes.  Abdominal: Soft. She exhibits no distension. There is no tenderness.  Musculoskeletal: She exhibits no edema or tenderness.  No tenderness to palpation on head to toe examination  Neurological: She is alert. She is disoriented.  Skin: Skin is warm and dry. No rash noted. She is not diaphoretic. No erythema.  Psychiatric: She has a normal mood and affect.    ED Course  Procedures (including critical care time) Labs Review Labs Reviewed  URINALYSIS, ROUTINE W REFLEX MICROSCOPIC (NOT AT [facility]) - Abnormal; Notable for the following:    APPearance  TURBID (*)    Nitrite POSITIVE (*)    Leukocytes, UA LARGE (*)    All other components within normal limits  URINE MICROSCOPIC-ADD ON - Abnormal; Notable for the following:    Squamous Epithelial / LPF 6-30 (*)    Bacteria, UA MANY (*)    All other components within normal limits  I-STAT CHEM 8, ED - Abnormal; Notable for the following:    Sodium 134 (*)    Potassium 3.2 (*)    Chloride 93 (*)    BUN 22 (*)    Glucose, Bld 102 (*)    Calcium, Ion 1.10 (*)    All other components within normal limits  URINE CULTURE    Imaging Review Ct Head Wo Contrast  11/14/2015  CLINICAL DATA:  Altered mental status. Patient was complaining of pain. EXAM: CT HEAD WITHOUT CONTRAST TECHNIQUE: Contiguous axial images were obtained from the base of the skull through the vertex without intravenous contrast. COMPARISON:  10/28/2015 FINDINGS: Diffuse cerebral atrophy. Ventricular dilatation likely due to central atrophy. Low-attenuation changes in the deep white matter consistent with small vessel ischemia. No mass effect or midline shift. No abnormal extra-axial fluid collections. Gray-white matter junctions are  distinct. Basal cisterns are not effaced. No evidence of acute intracranial hemorrhage. Postoperative changes with previous right frontal craniotomy. Small area of underlying encephalomalacia is likely postoperative. No depressed skull fractures. Visualized paranasal sinuses and mastoid air cells are not opacified. IMPRESSION: No acute intracranial abnormalities. Diffuse chronic atrophy and small vessel ischemic changes. Postoperative changes in the right frontal calvarium. Electronically Signed   By: Lucienne Capers M.D.   On: 11/14/2015 23:57   I have personally reviewed and evaluated these images and lab results as part of my medical decision-making.   EKG Interpretation   Date/Time:  Sunday November 14 2015 18:47:24 EST Ventricular Rate:  93 PR Interval:  126 QRS Duration: 84 QT Interval:  312 QTC Calculation: 388 R Axis:   77 Text Interpretation:  Sinus rhythm Probable left atrial enlargement Repol  abnrm suggests ischemia, anterolateral Confirmed by Alvino Chapel  MD, Ovid Curd  [phone]) on 11/14/2015 7:17:12 PM      MDM   75 year old female with a history of dementia, lung cancer with metastases to the brain status post brain tumor resection currently living in a skilled nursing facility and being treated for urinary tract infection with Macrobid presents to the ED for altered mental status. Per nursing facility staff patient has been yelling out in pain. Patient is disoriented and has no current complaints.  External examination with no obvious signs of trauma and no tenderness to palpation. She is afebrile with stable vital signs.  CT head with no new findings. Workup revealed urinary tract infection. No evidence of renal failure.  Given the fact the patient is on day 3 of Macrobid he feels that this does not correlate with felon outpatient management. He recommended patient continue her Macrobid course and follow-up with the primary care provider as they have obtain cultures. We did  obtain cultures seizures well for comparison.  This note will be forwarded to patient's PCP for continuity of care.  She is safe for discharge with strict return precautions. Patient is to follow-up with her PCP for continued management of her urinary tract infection.  Patient seen in conjunction with Dr. Alvino Chapel.  Final diagnoses:  UTI (lower urinary tract infection)        Addison Lank, MD 11/15/15 0040  Davonna Belling, MD 11/15/15 [phone]

## 2015-11-14 NOTE — ED Notes (Signed)
DINNER TRAY ORDERED  PT FED SHE ATE APPROX HALF

## 2015-11-14 NOTE — ED Notes (Addendum)
Pt here via [facility] from [place] for "yelling out in pain".  Pt was recently placed at [facility].  Pt is alert to self and situation.  Denies pain at this time.  EMS states neg on stroke screen and possible L sided facial droop, but unable to say if that is hr norm.  Staff at [facility] said the pt's LSN was "last week when she arrived here" and they were not sure if she had dementia or not.  CBG 101.  VS stable.

## 2015-11-15 NOTE — Discharge Instructions (Signed)

## 2015-11-15 NOTE — ED Notes (Signed)
Patient cleaned, depends placed, new sheets, and new gowned. Warm blanket given. Patient resting comfortably waiting for PTAR

## 2015-11-15 NOTE — ED Notes (Signed)
PTAR here for patient. Shirt and glasses given to staff.

## 2015-11-16 LAB — URINE CULTURE

## 2015-11-16 MED ORDER — NITROFURANTOIN MONOHYD MACRO 100 MG PO CAPS: 100.0000 mg | ORAL_CAPSULE | Freq: Two times a day (BID) | ORAL | Status: DC

## 2015-11-16 NOTE — Telephone Encounter (Signed)
Pete received call from [place] they never received rx for antibiotic. Per chart md sent rx to [facility]. She is requesting that the rx be faxed to them @ [phone]. Printed rx fax to [place]...Johny Chess

## 2015-11-17 ENCOUNTER — Telehealth (HOSPITAL_BASED_OUTPATIENT_CLINIC_OR_DEPARTMENT_OTHER): Payer: Self-pay | Admitting: Emergency Medicine

## 2015-11-17 NOTE — Telephone Encounter (Signed)
Post ED Visit - Positive Culture Follow-up  Culture report reviewed by antimicrobial stewardship pharmacist:  '[]'$  Elenor Quinones, Pharm.D. '[]'$  Heide Guile, Pharm.D., BCPS '[]'$  Parks Neptune, Pharm.D. '[x]'$  Alycia Rossetti, Pharm.D., BCPS '[]'$  [place], Pharm.D., BCPS, AAHIVP '[]'$  Legrand Como, Pharm.D., BCPS, AAHIVP '[]'$  Milus Glazier, Pharm.D. '[]'$  Stephens November, Pharm.D.  Positive urine culture E. coli Treated with cephalexin, organism sensitive to the same and no further patient follow-up is required at this time.  Hazle Nordmann 11/17/2015, 9:21 AM

## 2015-11-18 ENCOUNTER — Telehealth: Payer: Self-pay | Admitting: Internal Medicine

## 2015-11-18 ENCOUNTER — Other Ambulatory Visit: Payer: Self-pay | Admitting: Radiation Therapy

## 2015-11-18 ENCOUNTER — Encounter: Payer: Self-pay | Admitting: Radiation Therapy

## 2015-11-18 DIAGNOSIS — C7931 Secondary malignant neoplasm of brain: Secondary | ICD-10-CM

## 2015-11-18 NOTE — Progress Notes (Addendum)
Expand All Collapse All   1. Do you need a wheel chair? no   2. On oxygen? no  3. Have you ever had any surgery in the body part being scanned? yes, resection on 09/02/15 by Dr. Kathyrn Sheriff here at [facility]   4. Have you ever had any surgery on your brain or heart? brain yes, see above, heart, no.   5. Have you ever had surgery on your eyes or ears? no   6. Do you have a pacemaker or defibrillator? no   7. Do you have a Neurostimulator? no 8. Claustrophobic? no  9. Any risk for metal in eyes? no  10. Injury by bullet, buckshot, or shrapnel? no  11. Stent? no   12. Hx of Cancer? Yes, lung cancer with mets to the brain    13. Kidney or Liver disease? no  14. Hx of Lupus, Rheumatoid Arthritis or Scleroderma? no  15. IV Antibiotics or long term use of NSAIDS? no  16. HX of Hypertension? no  17. Diabetes? no  18. Allergy to contrast? no  19. Recent labs. Yes- in EPIC - scheduled for 3/1

## 2015-11-18 NOTE — Telephone Encounter (Signed)
States patient was with [facility] home health and was asked to discharge by family.  Now with [facility].  Requesting orders for PT and Speech Therapy.

## 2015-11-18 NOTE — Telephone Encounter (Signed)
Sarah from [facility] called she is going to be faxing some orders over to [place]. She is requesting face to face notes for the office visit of 10/14/15 to be faxed to [phone]

## 2015-11-19 ENCOUNTER — Telehealth: Payer: Self-pay | Admitting: Geriatric Medicine

## 2015-11-19 NOTE — Telephone Encounter (Signed)
Gave verbal orders to  [facility] for PT eval due to decline in physical mobility and communication skills.

## 2015-11-23 ENCOUNTER — Telehealth: Payer: Self-pay | Admitting: Internal Medicine

## 2015-11-23 NOTE — Telephone Encounter (Signed)
Needs approval for plan of care for patient.

## 2015-11-24 NOTE — Telephone Encounter (Signed)
Amy from [facility] has called back for the plan of care. Please give her a call asap at [phone]

## 2015-11-29 ENCOUNTER — Telehealth: Payer: Self-pay | Admitting: Medical Oncology

## 2015-11-29 NOTE — Telephone Encounter (Signed)
Tamara Ortiz confirmed appts and is trying to arrange transportation  for MRI , [employer]ammi Klippel and [employer].

## 2015-12-01 ENCOUNTER — Other Ambulatory Visit: Payer: Medicare Other

## 2015-12-02 ENCOUNTER — Ambulatory Visit
Admission: RE | Admit: 2015-12-02 | Discharge: 2015-12-02 | Disposition: A | Discharge status: Home / Self Care | Payer: Medicare Other | Source: Ambulatory Visit | Attending: Radiation Oncology | Admitting: Radiation Oncology

## 2015-12-02 ENCOUNTER — Other Ambulatory Visit: Payer: Medicare Other

## 2015-12-02 DIAGNOSIS — C7931 Secondary malignant neoplasm of brain: Secondary | ICD-10-CM

## 2015-12-02 MED ORDER — GADOBENATE DIMEGLUMINE 529 MG/ML IV SOLN
10.0000 mL | Freq: Once | INTRAVENOUS | Status: AC | PRN
  Administered 2015-12-02: 10 mL via INTRAVENOUS

## 2015-12-06 ENCOUNTER — Ambulatory Visit: Admission: RE | Admit: 2015-12-06 | Payer: Medicare Other | Source: Ambulatory Visit | Admitting: Radiation Oncology

## 2015-12-06 ENCOUNTER — Ambulatory Visit: Payer: Medicare Other | Admitting: Internal Medicine

## 2015-12-07 ENCOUNTER — Other Ambulatory Visit: Payer: Medicare Other

## 2015-12-07 ENCOUNTER — Ambulatory Visit: Payer: Medicare Other | Admitting: Internal Medicine

## 2015-12-07 ENCOUNTER — Telehealth: Payer: Self-pay | Admitting: *Deleted

## 2015-12-07 ENCOUNTER — Telehealth: Payer: Self-pay | Admitting: Internal Medicine

## 2015-12-07 NOTE — Telephone Encounter (Signed)
Left message to inform family of new appt date/time 3/13 at 63 am

## 2015-12-07 NOTE — Telephone Encounter (Signed)
12-13-2015 Appointment information communicated via voicemail for [facility].  Asked for return call to confirm receipt.
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
## 2015-12-08 NOTE — Telephone Encounter (Signed)
[place] this morning.  She has received voice mails about the December 13, 2015 appointment and notified Family.  Also will make transportation arrangements for Ms. Alix.

## 2015-12-09 ENCOUNTER — Emergency Department (HOSPITAL_COMMUNITY): Payer: Medicare Other

## 2015-12-09 ENCOUNTER — Emergency Department (HOSPITAL_COMMUNITY)
Admission: EM | Admit: 2015-12-09 | Discharge: 2015-12-09 | Disposition: A | Discharge status: Home / Self Care | Payer: Medicare Other | Attending: Emergency Medicine | Admitting: Emergency Medicine

## 2015-12-09 ENCOUNTER — Encounter (HOSPITAL_COMMUNITY): Payer: Self-pay

## 2015-12-09 DIAGNOSIS — R627 Adult failure to thrive: Secondary | ICD-10-CM | POA: Diagnosis present

## 2015-12-09 DIAGNOSIS — Z85118 Personal history of other malignant neoplasm of bronchus and lung: Secondary | ICD-10-CM | POA: Diagnosis not present

## 2015-12-09 DIAGNOSIS — N390 Urinary tract infection, site not specified: Secondary | ICD-10-CM | POA: Diagnosis not present

## 2015-12-09 DIAGNOSIS — E785 Hyperlipidemia, unspecified: Secondary | ICD-10-CM | POA: Diagnosis not present

## 2015-12-09 DIAGNOSIS — F419 Anxiety disorder, unspecified: Secondary | ICD-10-CM | POA: Insufficient documentation

## 2015-12-09 DIAGNOSIS — Z79899 Other long term (current) drug therapy: Secondary | ICD-10-CM | POA: Diagnosis not present

## 2015-12-09 DIAGNOSIS — I10 Essential (primary) hypertension: Secondary | ICD-10-CM | POA: Insufficient documentation

## 2015-12-09 DIAGNOSIS — Z85841 Personal history of malignant neoplasm of brain: Secondary | ICD-10-CM | POA: Insufficient documentation

## 2015-12-09 DIAGNOSIS — Z880 Allergy status to penicillin: Secondary | ICD-10-CM | POA: Insufficient documentation

## 2015-12-09 LAB — COMPREHENSIVE METABOLIC PANEL
ALT: 30 U/L (ref 14–54)
ANION GAP: 15 (ref 5–15)
AST: 39 U/L (ref 15–41)
Albumin: 3.4 g/dL — ABNORMAL LOW (ref 3.5–5.0)
Alkaline Phosphatase: 96 U/L (ref 38–126)
BUN: 16 mg/dL (ref 6–20)
CHLORIDE: 95 mmol/L — AB (ref 101–111)
CO2: 28 mmol/L (ref 22–32)
Calcium: 9.5 mg/dL (ref 8.9–10.3)
Creatinine, Ser: 0.86 mg/dL (ref 0.44–1.00)
Glucose, Bld: 85 mg/dL (ref 65–99)
POTASSIUM: 3.6 mmol/L (ref 3.5–5.1)
Sodium: 138 mmol/L (ref 135–145)
Total Bilirubin: 0.8 mg/dL (ref 0.3–1.2)
Total Protein: 6 g/dL — ABNORMAL LOW (ref 6.5–8.1)

## 2015-12-09 LAB — URINE MICROSCOPIC-ADD ON: Squamous Epithelial / LPF: NONE SEEN

## 2015-12-09 LAB — URINALYSIS, ROUTINE W REFLEX MICROSCOPIC
BILIRUBIN URINE: NEGATIVE
Glucose, UA: NEGATIVE mg/dL
Ketones, ur: 15 mg/dL — AB
Leukocytes, UA: NEGATIVE
NITRITE: POSITIVE — AB
PROTEIN: NEGATIVE mg/dL
SPECIFIC GRAVITY, URINE: 1.018 (ref 1.005–1.030)
pH: 6 (ref 5.0–8.0)

## 2015-12-09 LAB — CBC
HCT: 47 % — ABNORMAL HIGH (ref 36.0–46.0)
Hemoglobin: 15.7 g/dL — ABNORMAL HIGH (ref 12.0–15.0)
MCH: 32.5 pg (ref 26.0–34.0)
MCHC: 33.4 g/dL (ref 30.0–36.0)
MCV: 97.3 fL (ref 78.0–100.0)
PLATELETS: 288 10*3/uL (ref 150–400)
RBC: 4.83 MIL/uL (ref 3.87–5.11)
RDW: 13.3 % (ref 11.5–15.5)
WBC: 9.8 10*3/uL (ref 4.0–10.5)

## 2015-12-09 MED ORDER — NITROFURANTOIN MONOHYD MACRO 100 MG PO CAPS: 100.0000 mg | ORAL_CAPSULE | Freq: Two times a day (BID) | ORAL | Status: DC

## 2015-12-09 NOTE — ED Notes (Signed)
Phlebotomy at the bedside  

## 2015-12-09 NOTE — ED Notes (Signed)
Pt returned from [facility].

## 2015-12-09 NOTE — ED Notes (Signed)
PT. Coming from [place] via [place] for failure to thrive. Facility reports for the past week patient hasn't been eating or acting herself. Pt. Recently here for UTI. Pt. Hx of dementia, HTN, depression, brain, and lung cancer per EMS. EMS also reports that facility was a poor historian on the patient. No other information was obtained by EMS.

## 2015-12-09 NOTE — ED Provider Notes (Signed)
I assumed care in signout from dr linker Plan to f/u on urinalysis She has UTI Urine culture ordered Will place on macrobid Pt stable at this time Resting comfortably   Ripley Fraise, MD 12/09/15 1925

## 2015-12-09 NOTE — ED Notes (Signed)
Attempted to do an in and out catheter, without success. Jarrett Soho - RN aware.

## 2015-12-09 NOTE — ED Notes (Signed)
Attempted In and Out two times. Unable to access

## 2015-12-10 ENCOUNTER — Telehealth: Payer: Self-pay | Admitting: Internal Medicine

## 2015-12-10 NOTE — Telephone Encounter (Signed)
Faxed pt medical records to [facility] [phone]

## 2015-12-12 LAB — URINE CULTURE

## 2015-12-13 ENCOUNTER — Ambulatory Visit (HOSPITAL_BASED_OUTPATIENT_CLINIC_OR_DEPARTMENT_OTHER): Payer: Medicare Other | Admitting: Internal Medicine

## 2015-12-13 ENCOUNTER — Telehealth (HOSPITAL_BASED_OUTPATIENT_CLINIC_OR_DEPARTMENT_OTHER): Payer: Self-pay | Admitting: Emergency Medicine

## 2015-12-13 ENCOUNTER — Telehealth: Payer: Self-pay | Admitting: Medical Oncology

## 2015-12-13 ENCOUNTER — Encounter: Payer: Self-pay | Admitting: Internal Medicine

## 2015-12-13 ENCOUNTER — Other Ambulatory Visit (HOSPITAL_BASED_OUTPATIENT_CLINIC_OR_DEPARTMENT_OTHER): Payer: Medicare Other

## 2015-12-13 VITALS — BP 126/87 | HR 91 | Temp 98.0°F | Resp 18 | Ht 65.0 in

## 2015-12-13 DIAGNOSIS — C3412 Malignant neoplasm of upper lobe, left bronchus or lung: Secondary | ICD-10-CM | POA: Diagnosis present

## 2015-12-13 DIAGNOSIS — C3492 Malignant neoplasm of unspecified part of left bronchus or lung: Secondary | ICD-10-CM

## 2015-12-13 DIAGNOSIS — R410 Disorientation, unspecified: Secondary | ICD-10-CM

## 2015-12-13 DIAGNOSIS — C7951 Secondary malignant neoplasm of bone: Secondary | ICD-10-CM

## 2015-12-13 DIAGNOSIS — R4701 Aphasia: Secondary | ICD-10-CM

## 2015-12-13 DIAGNOSIS — C7931 Secondary malignant neoplasm of brain: Secondary | ICD-10-CM

## 2015-12-13 LAB — BASIC METABOLIC PANEL
BUN: 13 mg/dL (ref 4–21)
CREATININE: 0.8 mg/dL (ref 0.5–1.1)
Glucose: 104 mg/dL
Potassium: 3.2 mmol/L — AB (ref 3.4–5.3)
Sodium: 135 mmol/L — AB (ref 137–147)

## 2015-12-13 LAB — COMPREHENSIVE METABOLIC PANEL
ALBUMIN: 3.5 g/dL (ref 3.5–5.0)
ALK PHOS: 106 U/L (ref 40–150)
ALT: 29 U/L (ref 0–55)
AST: 34 U/L (ref 5–34)
Anion Gap: 13 mEq/L — ABNORMAL HIGH (ref 3–11)
BILIRUBIN TOTAL: 0.72 mg/dL (ref 0.20–1.20)
BUN: 12.8 mg/dL (ref 7.0–26.0)
CALCIUM: 9.6 mg/dL (ref 8.4–10.4)
CO2: 29 mEq/L (ref 22–29)
Chloride: 94 mEq/L — ABNORMAL LOW (ref 98–109)
Creatinine: 0.8 mg/dL (ref 0.6–1.1)
EGFR: 69 mL/min/{1.73_m2} — AB (ref >90)
Glucose: 104 mg/dl (ref 70–140)
POTASSIUM: 3.2 meq/L — AB (ref 3.5–5.1)
Sodium: 135 mEq/L — ABNORMAL LOW (ref 136–145)
TOTAL PROTEIN: 6.9 g/dL (ref 6.4–8.3)

## 2015-12-13 LAB — HEPATIC FUNCTION PANEL
ALK PHOS: 106 U/L (ref 25–125)
ALT: 29 U/L (ref 7–35)
AST: 34 U/L (ref 13–35)
BILIRUBIN, TOTAL: 0.7 mg/dL

## 2015-12-13 LAB — CBC WITH DIFFERENTIAL/PLATELET
BASO%: 0.5 % (ref 0.0–2.0)
BASOS ABS: 0 10*3/uL (ref 0.0–0.1)
EOS%: 0.4 % (ref 0.0–7.0)
Eosinophils Absolute: 0 10*3/uL (ref 0.0–0.5)
HEMATOCRIT: 48.5 % — AB (ref 34.8–46.6)
HEMOGLOBIN: 16.3 g/dL — AB (ref 11.6–15.9)
LYMPH#: 1.2 10*3/uL (ref 0.9–3.3)
LYMPH%: 14.7 % (ref 14.0–49.7)
MCH: 32.3 pg (ref 25.1–34.0)
MCHC: 33.5 g/dL (ref 31.5–36.0)
MCV: 96.3 fL (ref 79.5–101.0)
MONO#: 0.6 10*3/uL (ref 0.1–0.9)
MONO%: 7.4 % (ref 0.0–14.0)
NEUT#: 6.5 10*3/uL (ref 1.5–6.5)
NEUT%: 77 % — ABNORMAL HIGH (ref 38.4–76.8)
Platelets: 308 10*3/uL (ref 145–400)
RBC: 5.04 10*6/uL (ref 3.70–5.45)
RDW: 13.5 % (ref 11.2–14.5)
WBC: 8.4 10*3/uL (ref 3.9–10.3)

## 2015-12-13 LAB — CBC AND DIFFERENTIAL
HEMATOCRIT: 48 % — AB (ref 36–46)
Hemoglobin: 16.3 g/dL — AB (ref 12.0–16.0)
PLATELETS: 308 10*3/uL (ref 150–399)
WBC: 8.4 10*3/mL

## 2015-12-13 NOTE — Telephone Encounter (Signed)
Post ED Visit - Positive Culture Follow-up  Culture report reviewed by antimicrobial stewardship pharmacist:  '[]'$  Elenor Quinones, Pharm.D. '[]'$  Heide Guile, Pharm.D., BCPS '[]'$  Parks Neptune, Pharm.D. '[]'$  Alycia Rossetti, Pharm.D., BCPS '[]'$  [place], Pharm.D., BCPS, AAHIVP '[]'$  Legrand Como, Pharm.D., BCPS, AAHIVP '[]'$  Milus Glazier, Pharm.D. '[]'$  Stephens November, Pharm.DGovernor Specking PharmD  Positive urine culture E. coli Treated with nitrofurantoin, organism sensitive to the same and no further patient follow-up is required at this time.  Hazle Nordmann 12/13/2015, 11:09 AM

## 2015-12-13 NOTE — Telephone Encounter (Signed)
Referral to hospice done

## 2015-12-13 NOTE — Progress Notes (Signed)
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   PRIOR THERAPY:  1. Status post left upper lobectomy with mediastinal lymph node dissection under the care of Dr. Roxan Hockey on December 10, 2007.  2. Status post 4 cycles of adjuvant chemotherapy with cisplatin and Taxotere. Last dose was given 03/22/2008.  3. Status post 6 cycles of systemic chemotherapy with carboplatin and Alimta for metastatic disease in the bone. The last dose was given July 02, 2010, with stable disease.  4. Status post 12 cycles of maintenance Alimta at 500 mg/sq m given every 3 weeks. The last dose was given 05/08/2011. 5. Stereotactic radiotherapy to metastatic brain lesions under the care of Dr. Tammi Klippel completed on 07/30/2014. 6. Maintenance Alimta at 500 mg/sq m given every 4 weeks. The patient is status post 52 cycles. This treatment was discontinued secondary to metastatic brain lesion and significant decline in her general condition. 7. Zometa 4 mg IV every 2 months. This was also discontinued secondary to decline of her general condition.   CURRENT THERAPY: None.  CHEMOTHERAPY INTENT: Palliative  CURRENT # OF CHEMOTHERAPY CYCLES: 52 CURRENT ANTIEMETICS: none  CURRENT SMOKING STATUS: Never smoker   ORAL CHEMOTHERAPY AND CONSENT: n/a  CURRENT BISPHOSPHONATES USE: Yes, Zometa given every 2 months  PAIN MANAGEMENT: [facility]  NARCOTICS INDUCED CONSTIPATION: Occasional, uses MiraLax  LIVING WILL AND CODE STATUS: Has a living will   INTERVAL HISTORY: Armentha Branagan 75 y.o. female returns to the clinic today for followup visit accompanied by friend, her cousin and her cousin's son. The  patient is currently a resident of his skilled nursing facility. She has significant decline in her general condition with more confusion and expressive aphasia. Unfortunately she was unable to communicate well during the visit today. The history was taken from her family. She was treated recently for urinary tract infection. She has been off chemotherapy for several months. She denied having any significant weight loss or night sweats. She has no chest pain, shortness of breath, cough or hemoptysis. She has no fever or chills. She is here today for evaluation and discussion of her condition.  MEDICAL HISTORY: Past Medical History  Diagnosis Date  . Hypertension   . HYPERLIPIDEMIA 11/05/2007  . ANXIETY 05/13/2009  . DEPRESSIVE DISORDER 05/06/2008  . HYPERTENSION 11/05/2007  . ALLERGIC RHINITIS 11/05/2007  . CONSTIPATION, CHRONIC 05/13/2009  . UTI 05/06/2008  . BACK PAIN 05/11/2008  . OSTEOPOROSIS 11/05/2007  . INSOMNIA-SLEEP DISORDER-UNSPEC 05/13/2009  . FATIGUE 05/13/2009  . Swelling, mass, or lump in chest 11/05/2007  . LUNG CANCER, HX OF 05/06/2008  . Neuropathy ([place]) 08/09/2015  . Arthritis   . 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 Lung cancer ([place])   . Brain cancer ([place])     ALLERGIES:  is allergic to amoxicillin; levaquin; and lorazepam.  MEDICATIONS:  Current Outpatient Prescriptions  Medication Sig Dispense Refill  . Calcium Carbonate-Vitamin D (CALCIUM-VITAMIN D) 500-200 MG-UNIT per tablet Take 1 tablet by mouth daily.     . cholecalciferol (VITAMIN D) 1000 UNITS tablet Take 1,000 Units by mouth daily.    [name] dexamethasone (DECADRON) 1 MG tablet Take  1 tablet (1 mg total) by mouth 2 (two) times daily. 10 tablet 0  . folic acid (FOLVITE) 1 MG tablet TAKE 1 TABLET BY MOUTH DAILY 90 tablet 0  . HYDROcodone-acetaminophen (NORCO) 5-325 MG tablet Take 1 tablet by mouth every 6 (six) hours as needed for moderate pain. 30 tablet 0  . levETIRAcetam (KEPPRA) 500 MG tablet Take 1 tablet  (500 mg total) by mouth 2 (two) times daily. 60 tablet 0  . Multiple Vitamin (MULTIVITAMIN WITH MINERALS) TABS tablet Take 1 tablet by mouth daily. Women's One A Day    . nitrofurantoin, macrocrystal-monohydrate, (MACROBID) 100 MG capsule Take 1 capsule (100 mg total) by mouth 2 (two) times daily. X 7 days 14 capsule 0  . pantoprazole (PROTONIX) 40 MG tablet Take 1 tablet (40 mg total) by mouth daily. 90 tablet 3  . vitamin C (ASCORBIC ACID) 500 MG tablet Take 500 mg by mouth daily.    [name] zolpidem (AMBIEN) 5 MG tablet Take 1 tablet (5 mg total) by mouth at bedtime as needed for sleep. 90 tablet 1  . hydrochlorothiazide (MICROZIDE) 12.5 MG capsule Take 1 capsule (12.5 mg total) by mouth daily as needed. (Patient taking differently: Take 12.5 mg by mouth daily. ) 30 capsule 11  . ondansetron (ZOFRAN ODT) 8 MG disintegrating tablet Take 1 tablet (8 mg total) by mouth every 8 (eight) hours as needed for nausea or vomiting. (Patient not taking: Reported on 12/13/2015) 30 tablet 1  . polyethylene glycol powder (MIRALAX) powder Take 17 g by mouth daily as needed for moderate constipation. Reported on 12/13/2015    . prochlorperazine (COMPAZINE) 10 MG tablet Take 10 mg by mouth every 6 (six) hours as needed for nausea or vomiting. Reported on 12/13/2015     No current facility-administered medications for this visit.    SURGICAL HISTORY:  Past Surgical History  Procedure Laterality Date  . Tubal ligation    . S/p lul lung surgury  12/2007  . Lobectomy    . Eye surgery      cataracts removed. IOL , bilateral   . Tonsillectomy    . Appendectomy    . Craniotomy Right 09/02/2015    Procedure: CRANIOTOMY TUMOR EXCISION w/Curve;  Surgeon: Consuella Lose, MD;  Location: [facility];  Service: Neurosurgery;  Laterality: Right;  Right frontal stereotactic craniotomy for resection of tumor with brainlab  . Application of cranial navigation N/A 09/02/2015    Procedure: APPLICATION OF CRANIAL NAVIGATION;   Surgeon: Consuella Lose, MD;  Location: [facility];  Service: Neurosurgery;  Laterality: N/A;  APPLICATION OF CRANIAL NAVIGATION    REVIEW OF SYSTEMS:  A comprehensive review of systems was negative except for: Constitutional: positive for fatigue Neurological: positive for coordination problems, memory problems, speech problems and weakness   PHYSICAL EXAMINATION: General appearance: alert, cooperative and no distress Head: Normocephalic, without obvious abnormality, atraumatic Neck: no adenopathy Lymph nodes: Cervical, supraclavicular, and axillary nodes normal. Resp: clear to auscultation bilaterally Back: symmetric, no curvature. ROM normal. No CVA tenderness. Cardio: regular rate and rhythm, S1, S2 normal, no murmur, click, rub or gallop GI: soft, non-tender; bowel sounds normal; no masses,  no organomegaly Extremities: extremities normal, atraumatic, no cyanosis or edema Neurologic: Alert and oriented X 3, normal strength and tone. Normal symmetric reflexes. Normal coordination and gait  ECOG PERFORMANCE STATUS: 1 - Symptomatic but completely ambulatory  There were no vitals taken for this visit.  LABORATORY DATA: Lab Results  Component Value Date   WBC  8.4 12/13/2015   HGB 16.3* 12/13/2015   HCT 48.5* 12/13/2015   MCV 96.3 12/13/2015   PLT 308 12/13/2015      Chemistry      Component Value Date/Time   NA 138 12/09/2015 1420   NA 136 11/01/2015 0925   NA 140 04/17/2012 0855   K 3.6 12/09/2015 1420   K 3.6 11/01/2015 0925   K 4.1 04/17/2012 0855   CL 95* 12/09/2015 1420   CL 100 03/04/2013 0901   CL 99 04/17/2012 0855   CO2 28 12/09/2015 1420   CO2 23 11/01/2015 0925   CO2 29 04/17/2012 0855   BUN 16 12/09/2015 1420   BUN 14.2 11/01/2015 0925   BUN 15 04/17/2012 0855   CREATININE 0.86 12/09/2015 1420   CREATININE 0.9 11/01/2015 0925   CREATININE 1.4* 04/17/2012 0855      Component Value Date/Time   CALCIUM 9.5 12/09/2015 1420   CALCIUM 9.0 11/01/2015  0925   CALCIUM 9.5 04/17/2012 0855   ALKPHOS 96 12/09/2015 1420   ALKPHOS 77 11/01/2015 0925   ALKPHOS 68 04/17/2012 0855   AST 39 12/09/2015 1420   AST 16 11/01/2015 0925   AST 29 04/17/2012 0855   ALT 30 12/09/2015 1420   ALT 24 11/01/2015 0925   ALT 31 04/17/2012 0855   BILITOT 0.8 12/09/2015 1420   BILITOT 0.55 11/01/2015 0925   BILITOT 0.60 04/17/2012 0855       RADIOGRAPHIC STUDIES: Dg Chest 2 View  12/09/2015  CLINICAL DATA:  Altered mental status and failure to thrive EXAM: CHEST  2 VIEW COMPARISON:  10/28/2015 FINDINGS: Cardiac shadow is within normal limits. Postsurgical changes are again seen. The lungs are clear bilaterally. No acute bony abnormality is noted. Stable sclerotic focus is noted at C6 similar to that seen on prior exams. IMPRESSION: No acute abnormality noted. Electronically Signed   By: Inez Catalina M.D.   On: 12/09/2015 13:45   Ct Head Wo Contrast  11/14/2015  CLINICAL DATA:  Altered mental status. Patient was complaining of pain. EXAM: CT HEAD WITHOUT CONTRAST TECHNIQUE: Contiguous axial images were obtained from the base of the skull through the vertex without intravenous contrast. COMPARISON:  10/28/2015 FINDINGS: Diffuse cerebral atrophy. Ventricular dilatation likely due to central atrophy. Low-attenuation changes in the deep white matter consistent with small vessel ischemia. No mass effect or midline shift. No abnormal extra-axial fluid collections. Gray-white matter junctions are distinct. Basal cisterns are not effaced. No evidence of acute intracranial hemorrhage. Postoperative changes with previous right frontal craniotomy. Small area of underlying encephalomalacia is likely postoperative. No depressed skull fractures. Visualized paranasal sinuses and mastoid air cells are not opacified. IMPRESSION: No acute intracranial abnormalities. Diffuse chronic atrophy and small vessel ischemic changes. Postoperative changes in the right frontal calvarium.  Electronically Signed   By: Lucienne Capers M.D.   On: 11/14/2015 23:57   Mr Jeri Cos NW Contrast  12/02/2015  CLINICAL DATA:  75 year old female with non-small cell lung cancer 2009. Post chemotherapy and radiation therapy (completed 07/30/2014). Brain surgery December 2016. Eighteen months stereotactic radiation surgery protocol restaging. Subsequent encounter. EXAM: MRI HEAD WITHOUT AND WITH CONTRAST TECHNIQUE: Multiplanar, multiecho pulse sequences of the brain and surrounding structures were obtained without and with intravenous contrast. CONTRAST:  68m MULTIHANCE GADOBENATE DIMEGLUMINE 529 MG/ML IV SOLN COMPARISON:  Several prior exams. Most recent head CT 11/14/2015. Postoperative brain MR 09/03/2015. Preoperative brain MR 09/01/2015. FINDINGS: Exam is slightly motion degraded. Prior right frontal craniotomy for resection of right  frontal lobe lesion. Interval partial clearing of postoperative hemorrhage in this region with cystic cavity remaining. Linear peripheral enhancement may reflect postoperative changes rather than recurrent tumor. Stability can be confirmed on follow-up. Immediately posterior to the right frontal craniotomy site is a 1 cm enhancing lesion minimally larger than prior exams (series 10, image 133). Peripheral right frontal lobe 4 x 7 x 3 mm enhancing lesion is stable (series 10, image 126). Subtle enhancement in a gyriform distribution right frontal lobe has progressed since prior exam (series 10, image 109 series 12, image 9) suggestive of progressive metastatic disease. Tiny nodular enhancing lesion medial anterior right occipital lobe suspected rather than vessel (series 10, image 109). Similar appearance punctate enhancement left frontal lobe (series 10, image 127). Increase in nodular enhancement posterior inferior to this level within the posterior left frontal lobe (series 10, image 124) suggestive of progressive metastatic disease. No acute thrombotic infarct. Marked  confluent white matter changes periventricular and subcortical region have progressed from the prior examination suggestive of result of treatment of tumor. Global atrophy. Ventricular prominence slightly more notable than on prior exam. It is possible this is related to central atrophy although difficult to exclude superimposed mild hydrocephalus. No obstructing lesion at the level of the aqueduct. Cervical spondylotic changes with protrusions contributing to spinal stenosis C3-4 and C4-5 level with mild cord flattening C4-5 level. Partially empty sella. Major intracranial vascular structures are patent. IMPRESSION: Prior right frontal craniotomy for resection of right frontal lobe lesion. Interval partial clearing of postoperative hemorrhage in this region with cystic cavity remaining. Linear peripheral enhancement may reflect postoperative changes rather than recurrent tumor. Stability can be confirmed on follow-up. Otherwise, intracranial enhancing lesions consistent with metastatic disease are stable or slightly larger including: Immediately posterior to the right frontal craniotomy site 1 cm enhancing lesion minimally larger (series 10, image 133). Peripheral right frontal lobe 4 x 7 x 3 mm enhancing lesion is stable (series 10, image 126). Subtle enhancement in a gyriform distribution right frontal lobe has progressed since prior exam (series 10, image 109 series 12, image 9). Tiny nodular enhancing lesion medial anterior right occipital lobe suspected rather than vessel (series 10, image 109). Similar appearance of punctate enhancement left frontal lobe (series 10, image 127). Increase in nodular enhancement posterior left frontal lobe (series 10, image 124). Marked confluent white matter changes periventricular and subcortical region have progressed from the prior examination suggestive of result of treatment of tumor. Global atrophy. Ventricular prominence slightly more notable than on prior exam. It is  possible this is related to central atrophy although difficult to exclude superimposed mild hydrocephalus. No obstructing lesion at the level of the aqueduct. Cervical spondylotic changes with protrusions contributing to spinal stenosis C3-4 and C4-5 level with mild cord flattening C4-5 level. Electronically Signed   By: Genia Del M.D.   On: 12/02/2015 12:17    ASSESSMENT AND PLAN: This is a very pleasant 75 years old white female with recurrent non-small cell lung cancer, adenocarcinoma. The patient is currently on maintenance Alimta monthly status post total of 52 cycles.  The patient has been off treatment for several months. Unfortunately her condition is declining rapidly and the patient is confused and unable to express herself. She is unable to take care of herself. I had a lengthy discussion with the patient and her family. I strongly recommended for them to consider palliative care and hospice at this point. There are in agreement with the current plan. I will see the patient  on as-needed basis at this point. The patient and her family were advised to call immediately if she has any concerning symptoms in the interval. The patient voices understanding of current disease status and treatment options and is in agreement with the current care plan.  All questions were answered. The patient knows to call the clinic with any problems, questions or concerns. We can certainly see the patient much sooner if necessary.  Disclaimer: This note was dictated with voice recognition software. Similar sounding words can inadvertently be transcribed and may not be corrected upon review.

## 2015-12-16 ENCOUNTER — Telehealth: Payer: Self-pay

## 2015-12-16 DIAGNOSIS — N390 Urinary tract infection, site not specified: Secondary | ICD-10-CM

## 2015-12-16 NOTE — Telephone Encounter (Signed)
Fax received and given to PCP for signature. Charge Capture Attached.

## 2015-12-17 ENCOUNTER — Telehealth: Payer: Self-pay | Admitting: Radiation Oncology

## 2015-12-17 NOTE — Telephone Encounter (Signed)
Received message from patient's sister, Wilburn Cornelia, requesting return call. Unable to reach [place] phoned her son, Orene Desanctis. Orene Desanctis questions if his aunt, Rosalyn Archambault, has any future appointments or scans scheduled with radiation oncology. Confirmed the patient is under HOSPICE care. Explained no appointments are scheduled or needed since HOSPICE is now caring for her. Orene Desanctis understands to contact us should a change in her status take place.

## 2015-12-21 NOTE — Telephone Encounter (Signed)
pcp signed and returned forms. Copy sent to scan and copy placed in Dahlia's office with charge capture sheet  Faxed to [facility] [phone]

## 2015-12-22 NOTE — ED Provider Notes (Signed)
CSN: [id number]     Arrival date & time 12/09/15  1249 History   First MD Initiated Contact with Patient 12/09/15 1249     Chief Complaint  Patient presents with  . Failure To Thrive     (Consider location/radiation/quality/duration/timing/severity/associated sxs/prior Treatment) HPI  A LEVEL 5 CAVEAT PERTAINS DUE TO DEMENTIA.  Pt presents from her nursing facility for failure for failure to thrive.  Pt per report has not been actiing herself.  She has been eating and drinking less.  She was recently treated for a UTI.    Past Medical History  Diagnosis Date  . Hypertension   . HYPERLIPIDEMIA 11/05/2007  . ANXIETY 05/13/2009  . DEPRESSIVE DISORDER 05/06/2008  . HYPERTENSION 11/05/2007  . ALLERGIC RHINITIS 11/05/2007  . CONSTIPATION, CHRONIC 05/13/2009  . UTI 05/06/2008  . BACK PAIN 05/11/2008  . OSTEOPOROSIS 11/05/2007  . INSOMNIA-SLEEP DISORDER-UNSPEC 05/13/2009  . FATIGUE 05/13/2009  . Swelling, mass, or lump in chest 11/05/2007  . LUNG CANCER, HX OF 05/06/2008  . Neuropathy ([place]) 08/09/2015  . Arthritis   . 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 Lung cancer ([place])   . Brain cancer [facility])    Past Surgical History  Procedure Laterality Date  . Tubal ligation    . S/p lul lung surgury  12/2007  . Lobectomy    . Eye surgery      cataracts removed. IOL , bilateral   . Tonsillectomy    . Appendectomy    . Craniotomy Right 09/02/2015    Procedure: CRANIOTOMY TUMOR EXCISION w/Curve;  Surgeon: Consuella Lose, MD;  Location: [facility];  Service: Neurosurgery;  Laterality: Right;  Right frontal stereotactic craniotomy for resection of tumor with brainlab  . Application of cranial navigation N/A 09/02/2015    Procedure: APPLICATION OF CRANIAL NAVIGATION;  Surgeon: Consuella Lose, MD;  Location: [facility];  Service: Neurosurgery;  Laterality: N/A;  APPLICATION OF CRANIAL NAVIGATION   Family History  Problem Relation Age of Onset  . Heart disease Mother   . ALS Father   .  Alcohol abuse Son     ETOH  . Cancer Neg Hx   . Colon cancer Neg Hx   . Esophageal cancer Neg Hx   . Stomach cancer Neg Hx   . Rectal cancer Neg Hx    Social History  Substance Use Topics  . Smoking status: Never Smoker   . Smokeless tobacco: Never Used  . Alcohol Use: Yes     Comment: occasionally   OB History    Gravida Para Term Preterm AB TAB SAB Ectopic Multiple Living   1 1             Review of Systems  UNABLE TO OBTAIN ROS DUE TO LEVEL 5 CAVEAT    Allergies  Amoxicillin; Levaquin; and Lorazepam  Home Medications   Prior to Admission medications   Medication Sig Start Date End Date Taking? Authorizing Provider  Calcium Carbonate-Vitamin D (CALCIUM-VITAMIN D) 500-200 MG-UNIT per tablet Take 1 tablet by mouth daily.    Yes Historical Provider, MD  cholecalciferol (VITAMIN D) 1000 UNITS tablet Take 1,000 Units by mouth daily.   Yes Historical Provider, MD  dexamethasone (DECADRON) 1 MG tablet Take 1 tablet (1 mg total) by mouth 2 (two) times daily. 09/08/15  Yes Consuella Lose, MD  folic acid (FOLVITE) 1 MG tablet TAKE 1 TABLET BY MOUTH DAILY 03/03/15  Yes Curt Bears, MD  hydrochlorothiazide (  MICROZIDE) 12.5 MG capsule Take 1 capsule (12.5 mg total) by mouth daily as needed. Patient taking differently: Take 12.5 mg by mouth daily.  10/14/15  Yes Biagio Borg, MD  HYDROcodone-acetaminophen (NORCO) 5-325 MG tablet Take 1 tablet by mouth every 6 (six) hours as needed for moderate pain. 11/05/15  Yes Biagio Borg, MD  levETIRAcetam (KEPPRA) 500 MG tablet Take 1 tablet (500 mg total) by mouth 2 (two) times daily. 09/08/15  Yes Consuella Lose, MD  Multiple Vitamin (MULTIVITAMIN WITH MINERALS) TABS tablet Take 1 tablet by mouth daily. Women's One A Day   Yes Historical Provider, MD  pantoprazole (PROTONIX) 40 MG tablet Take 1 tablet (40 mg total) by mouth daily. 06/22/15  Yes Biagio Borg, MD  polyethylene glycol powder [facility]) powder Take 17 g by mouth daily as needed for  moderate constipation. Reported on 12/13/2015   Yes Historical Provider, MD  vitamin C (ASCORBIC ACID) 500 MG tablet Take 500 mg by mouth daily.   Yes Historical Provider, MD  zolpidem (AMBIEN) 5 MG tablet Take 1 tablet (5 mg total) by mouth at bedtime as needed for sleep. 11/05/15  Yes Biagio Borg, MD  nitrofurantoin, macrocrystal-monohydrate, (MACROBID) 100 MG capsule Take 1 capsule (100 mg total) by mouth 2 (two) times daily. X 7 days 12/09/15   Ripley Fraise, MD  ondansetron (ZOFRAN ODT) 8 MG disintegrating tablet Take 1 tablet (8 mg total) by mouth every 8 (eight) hours as needed for nausea or vomiting. Patient not taking: Reported on 12/13/2015 06/22/15   Biagio Borg, MD  prochlorperazine (COMPAZINE) 10 MG tablet Take 10 mg by mouth every 6 (six) hours as needed for nausea or vomiting. Reported on 12/13/2015    Historical Provider, MD   BP 131/91 mmHg  Pulse 84  Temp(Src) 97.9 F (36.6 C) (Oral)  Resp 18  SpO2 99%  Vitals reviewed Physical Exam  Physical Examination: General appearance - alert, well appearing, and in no distress Mental status - alert, oriented to person, place, and time Eyes - pupils equal and reactive, extraocular eye movements intact Mouth - mucous membranes moist, pharynx normal without lesions Chest - clear to auscultation, no wheezes, rales or rhonchi, symmetric air entry Heart - normal rate, regular rhythm, normal S1, S2, no murmurs, rubs, clicks or gallops Abdomen - soft, nontender, nondistended, no masses or organomegaly Neurological - alert, oriented, normal speech, moving all extremities, following some commands, smiling when asked questions Extremities - peripheral pulses normal, no pedal edema, no clubbing or cyanosis Skin - normal coloration and turgor, no rashes  ED Course  Procedures (including critical care time) Labs Review Labs Reviewed  CBC - Abnormal; Notable for the following:    Hemoglobin 15.7 (*)    HCT 47.0 (*)    All other components  within normal limits  COMPREHENSIVE METABOLIC PANEL - Abnormal; Notable for the following:    Chloride 95 (*)    Total Protein 6.0 (*)    Albumin 3.4 (*)    All other components within normal limits  URINALYSIS, ROUTINE W REFLEX MICROSCOPIC (NOT AT [facility]) - Abnormal; Notable for the following:    APPearance CLOUDY (*)    Hgb urine dipstick SMALL (*)    Ketones, ur 15 (*)    Nitrite POSITIVE (*)    All other components within normal limits  URINE MICROSCOPIC-ADD ON - Abnormal; Notable for the following:    Bacteria, UA MANY (*)    Crystals CA OXALATE CRYSTALS (*)  All other components within normal limits  URINE CULTURE    Imaging Review No results found. I have personally reviewed and evaluated these images and lab results as part of my medical decision-making.   EKG Interpretation None      MDM   Final diagnoses:  Failure to thrive in adult  UTI (lower urinary tract infection)    Pt presenting due to concern for change in behavior, eating and drinking less.  Pt has hx of dementia- she is awake, alert, pleasant, following some commands answering some questions.  She appears well hydrated on my exam.  Workup is reassuring. urinalyis is pending- pt signed out to Dr. Christy Gentles.      Alfonzo Beers, MD 12/22/15 [phone]

## 2015-12-27 ENCOUNTER — Other Ambulatory Visit: Payer: Self-pay

## 2015-12-27 ENCOUNTER — Non-Acute Institutional Stay (SKILLED_NURSING_FACILITY): Payer: Medicare Other | Admitting: Internal Medicine

## 2015-12-27 ENCOUNTER — Encounter: Payer: Self-pay | Admitting: Internal Medicine

## 2015-12-27 DIAGNOSIS — C7931 Secondary malignant neoplasm of brain: Secondary | ICD-10-CM | POA: Diagnosis not present

## 2015-12-27 DIAGNOSIS — K5900 Constipation, unspecified: Secondary | ICD-10-CM

## 2015-12-27 DIAGNOSIS — E43 Unspecified severe protein-calorie malnutrition: Secondary | ICD-10-CM

## 2015-12-27 DIAGNOSIS — R627 Adult failure to thrive: Secondary | ICD-10-CM

## 2015-12-27 DIAGNOSIS — G4700 Insomnia, unspecified: Secondary | ICD-10-CM

## 2015-12-27 DIAGNOSIS — K219 Gastro-esophageal reflux disease without esophagitis: Secondary | ICD-10-CM | POA: Diagnosis not present

## 2015-12-27 DIAGNOSIS — I10 Essential (primary) hypertension: Secondary | ICD-10-CM

## 2015-12-27 DIAGNOSIS — Z418 Encounter for other procedures for purposes other than remedying health state: Secondary | ICD-10-CM

## 2015-12-27 DIAGNOSIS — Z2989 Encounter for other specified prophylactic measures: Secondary | ICD-10-CM

## 2015-12-27 DIAGNOSIS — Z298 Encounter for other specified prophylactic measures: Secondary | ICD-10-CM

## 2015-12-27 DIAGNOSIS — C3492 Malignant neoplasm of unspecified part of left bronchus or lung: Secondary | ICD-10-CM

## 2015-12-27 DIAGNOSIS — C7951 Secondary malignant neoplasm of bone: Secondary | ICD-10-CM

## 2015-12-27 DIAGNOSIS — C3491 Malignant neoplasm of unspecified part of right bronchus or lung: Secondary | ICD-10-CM

## 2015-12-27 MED ORDER — HYDROCODONE-ACETAMINOPHEN 5-325 MG PO TABS: ORAL_TABLET | ORAL | Status: DC

## 2015-12-27 NOTE — Telephone Encounter (Signed)
Rx faxed to [facility] @ [phone], phone number [phone]  

## 2015-12-27 NOTE — Progress Notes (Signed)
[facility]ATION[facility]  PCP: [name], MD   Code Status: DNR  Goals of care: Advanced Directive information Advanced Directives 12/27/2015  Does patient have an advance directive? Yes  Type of Advance Directive Out of facility DNR (pink MOST or yellow form)  Does patient want to make changes to advanced directive? No - Patient declined  Copy of advanced directive(s) in chart? Yes       Extended Emergency Contact Information Primary Emergency Contact: Lane,Shelby Address: [place], [place] [address] [name] Phone: [phone] Mobile Phone: [phone] Relation: Relative Secondary Emergency Contact: Lane,Preston Address: [address] [place], [place] [address] [name] Phone: [phone] Relation: Other   Allergies  Allergen Reactions  . Amoxicillin Hives and Itching    Has patient had a PCN reaction causing immediate rash, facial/tongue/throat swelling, SOB or lightheadedness with hypotension: -can't remember Has patient had a PCN reaction causing severe rash involving mucus membranes or skin necrosis: cant remember Has patient had a PCN reaction that required hospitalization Yes- ed visit Has patient had a PCN reaction occurring within the last 10 years: No If all of the above answers are "NO", then may proceed with Cephalosporin use.   Mack Hook [Levofloxacin] Nausea And Vomiting  . Lorazepam Other (See Comments)    confusion    Chief Complaint  Patient presents with  . New Admit To SNF    New Admission     HPI:  Patient is a 75 y.o. female seen today for long term care from [facility]. She has adenocarcinoma of lung with brain metastases, HTN, insomnia among others. She is holding her food and refusing to eat this am. She is not participating in HPI and ROS.   Review of Systems: Unable to obtain.    Past Medical History  Diagnosis Date  . Hypertension   .  HYPERLIPIDEMIA 11/05/2007  . ANXIETY 05/13/2009  . DEPRESSIVE DISORDER 05/06/2008  . HYPERTENSION 11/05/2007  . ALLERGIC RHINITIS 11/05/2007  . CONSTIPATION, CHRONIC 05/13/2009  . UTI 05/06/2008  . BACK PAIN 05/11/2008  . OSTEOPOROSIS 11/05/2007  . INSOMNIA-SLEEP DISORDER-UNSPEC 05/13/2009  . FATIGUE 05/13/2009  . Swelling, mass, or lump in chest 11/05/2007  . LUNG CANCER, HX OF 05/06/2008  . Neuropathy ([place]) 08/09/2015  . Arthritis   . 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 Lung cancer ([place])   . Brain cancer [facility])    Past Surgical History  Procedure Laterality Date  . Tubal ligation    . S/p lul lung surgury  12/2007  . Lobectomy    . Eye surgery      cataracts removed. IOL , bilateral   . Tonsillectomy    . Appendectomy    . Craniotomy Right 09/02/2015    Procedure: CRANIOTOMY TUMOR EXCISION w/Curve;  Surgeon: Consuella Lose, MD;  Location: [facility] [place] NEURO ORS;  Service: Neurosurgery;  Laterality: Right;  Right frontal stereotactic craniotomy for resection of tumor with brainlab  . Application of cranial navigation N/A 09/02/2015    Procedure: APPLICATION OF CRANIAL NAVIGATION;  Surgeon: Consuella Lose, MD;  Location: [place] NEURO ORS;  Service: Neurosurgery;  Laterality: N/A;  APPLICATION OF CRANIAL NAVIGATION   Social History:   reports that she has never smoked. She has never used smokeless tobacco. She reports that she drinks alcohol.  She reports that she does not use illicit drugs.  Family History  Problem Relation Age of Onset  . Heart disease Mother   . ALS Father   . Alcohol abuse Son     ETOH  . Cancer Neg Hx   . Colon cancer Neg Hx   . Esophageal cancer Neg Hx   . Stomach cancer Neg Hx   . Rectal cancer Neg Hx     Medications:   Medication List       This list is accurate as of: 12/27/15  2:59 PM.  Always use your most recent med list.               calcium-vitamin D 500-200 MG-UNIT tablet  Take 1 tablet by mouth daily.     cholecalciferol 1000 units  tablet  Commonly known as:  VITAMIN D  Take 1,000 Units by mouth daily.     dexamethasone 1 MG tablet  Commonly known as:  DECADRON  Take 1 tablet (1 mg total) by mouth 2 (two) times daily.     hydrochlorothiazide 12.5 MG capsule  Commonly known as:  MICROZIDE  Take 12.5 mg by mouth daily.     HYDROcodone-acetaminophen 5-325 MG tablet  Commonly known as:  NORCO  1 by mouth every 6 hours as needed for pain DO NOT EXCEED 4GM OF APAP IN 24 HOURS FROM ALL SOURCES     levETIRAcetam 500 MG tablet  Commonly known as:  KEPPRA  Take 1 tablet (500 mg total) by mouth 2 (two) times daily.     MIRALAX powder  Generic drug:  polyethylene glycol powder  Take 17 g by mouth daily as needed for moderate constipation. Reported on 12/13/2015     multivitamin with minerals Tabs tablet  Take 1 tablet by mouth daily. Women's One A Day     ondansetron 8 MG tablet  Commonly known as:  ZOFRAN  Take 8 mg by mouth every 8 (eight) hours as needed for nausea or vomiting.     pantoprazole 40 MG tablet  Commonly known as:  PROTONIX  Take 1 tablet (40 mg total) by mouth daily.     prochlorperazine 10 MG tablet  Commonly known as:  COMPAZINE  Take 10 mg by mouth every 6 (six) hours as needed for nausea or vomiting. Reported on 12/13/2015     vitamin C 500 MG tablet  Commonly known as:  ASCORBIC ACID  Take 500 mg by mouth daily.     zolpidem 5 MG tablet  Commonly known as:  AMBIEN  Take 1 tablet (5 mg total) by mouth at bedtime as needed for sleep.        Immunizations: Immunization History  Administered Date(s) Administered  . Influenza Split 07/12/2011, 07/17/2012  . Influenza, High Dose Seasonal PF 06/22/2015  . Influenza,inj,Quad PF,36+ Mos 06/25/2013, 07/06/2014  . PPD Test 12/26/2015  . Pneumococcal Conjugate-13 08/12/2014  . Pneumococcal Polysaccharide-23 03/18/2007, 08/28/2012  . Zoster 03/18/2007     Physical Exam: Filed Vitals:   12/27/15 1445  BP: 136/81  Pulse: 76  Temp:  97.3 F (36.3 C)  TempSrc: Oral  Resp: 18  SpO2: 94%    General- elderly female, frail and thin built, in no acute distress Head- normocephalic, atraumatic Nose- no maxillary or frontal sinus tenderness, no nasal discharge Throat- moist mucus membrane Eyes- PERRLA, EOMI, no pallor, no icterus, no discharge, normal conjunctiva, normal sclera Cardiovascular- normal s1,s2, no murmur, has leg edema Respiratory- poor air movement, no added  sounds Abdomen- bowel sounds present, soft, non tender Musculoskeletal- able to move all 4 extremities,generalized weakness Neurological- alert but not oriented Skin- warm and dry Psychiatry- normal mood and affect    Labs reviewed: Basic Metabolic Panel:  Recent Labs  10/28/15 1634 11/01/15 0925 11/14/15 2013 12/09/15 1420 12/13/15 12/13/15 0957  NA 132* 136 134* 138 135* 135*  K 3.8 3.6 3.2* 3.6 3.2* 3.2*  CL 98*  --  93* 95*  --   --   CO2 27 23  --  28  --  29  GLUCOSE 97 136 102* 85  --  104  BUN 15 14.2 22* 16 13 12.8  CREATININE 0.69 0.9 0.80 0.86 0.8 0.8  CALCIUM 9.0 9.0  --  9.5  --  9.6   Liver Function Tests:  Recent Labs  11/01/15 0925 12/09/15 1420 12/13/15 12/13/15 0957  AST 16 39 34 34  ALT '24 30 29 29  '$ ALKPHOS 77 96 106 106  BILITOT 0.55 0.8  --  0.72  PROT 7.0 6.0*  --  6.9  ALBUMIN 3.5 3.4*  --  3.5   No results for input(s): LIPASE, AMYLASE in the last 8760 hours. No results for input(s): AMMONIA in the last 8760 hours. CBC:  Recent Labs  10/28/15 1634 11/01/15 0925  12/09/15 1420 12/13/15 12/13/15 0957  WBC 6.6 6.3  --  9.8 8.4 8.4  NEUTROABS 5.5 5.6  --   --   --  6.5  HGB 13.5 14.9  < > 15.7* 16.3* 16.3*  HCT 38.8 44.0  < > 47.0* 48* 48.5*  MCV 96.3 96.8  --  97.3  --  96.3  PLT 220 270  --  288 308 308  < > = values in this interval not displayed.   CBG:  Recent Labs  08/13/15 0955 10/20/15 1542  GLUCAP 98 100*    Radiological Exams: Dg Chest 2 View  12/09/2015  CLINICAL DATA:   Altered mental status and failure to thrive EXAM: CHEST  2 VIEW COMPARISON:  10/28/2015 FINDINGS: Cardiac shadow is within normal limits. Postsurgical changes are again seen. The lungs are clear bilaterally. No acute bony abnormality is noted. Stable sclerotic focus is noted at C6 similar to that seen on prior exams. IMPRESSION: No acute abnormality noted. Electronically Signed   By: Inez Catalina M.D.   On: 12/09/2015 13:45   Mr Jeri Cos LD Contrast  12/02/2015  CLINICAL DATA:  75 year old female with non-small cell lung cancer 2009. Post chemotherapy and radiation therapy (completed 07/30/2014). Brain surgery December 2016. Eighteen months stereotactic radiation surgery protocol restaging. Subsequent encounter. EXAM: MRI HEAD WITHOUT AND WITH CONTRAST TECHNIQUE: Multiplanar, multiecho pulse sequences of the brain and surrounding structures were obtained without and with intravenous contrast. CONTRAST:  14m MULTIHANCE GADOBENATE DIMEGLUMINE 529 MG/ML IV SOLN COMPARISON:  Several prior exams. Most recent head CT 11/14/2015. Postoperative brain MR 09/03/2015. Preoperative brain MR 09/01/2015. FINDINGS: Exam is slightly motion degraded. Prior right frontal craniotomy for resection of right frontal lobe lesion. Interval partial clearing of postoperative hemorrhage in this region with cystic cavity remaining. Linear peripheral enhancement may reflect postoperative changes rather than recurrent tumor. Stability can be confirmed on follow-up. Immediately posterior to the right frontal craniotomy site is a 1 cm enhancing lesion minimally larger than prior exams (series 10, image 133). Peripheral right frontal lobe 4 x 7 x 3 mm enhancing lesion is stable (series 10, image 126). Subtle enhancement in a gyriform distribution right frontal lobe has progressed  since prior exam (series 10, image 109 series 12, image 9) suggestive of progressive metastatic disease. Tiny nodular enhancing lesion medial anterior right occipital  lobe suspected rather than vessel (series 10, image 109). Similar appearance punctate enhancement left frontal lobe (series 10, image 127). Increase in nodular enhancement posterior inferior to this level within the posterior left frontal lobe (series 10, image 124) suggestive of progressive metastatic disease. No acute thrombotic infarct. Marked confluent white matter changes periventricular and subcortical region have progressed from the prior examination suggestive of result of treatment of tumor. Global atrophy. Ventricular prominence slightly more notable than on prior exam. It is possible this is related to central atrophy although difficult to exclude superimposed mild hydrocephalus. No obstructing lesion at the level of the aqueduct. Cervical spondylotic changes with protrusions contributing to spinal stenosis C3-4 and C4-5 level with mild cord flattening C4-5 level. Partially empty sella. Major intracranial vascular structures are patent. IMPRESSION: Prior right frontal craniotomy for resection of right frontal lobe lesion. Interval partial clearing of postoperative hemorrhage in this region with cystic cavity remaining. Linear peripheral enhancement may reflect postoperative changes rather than recurrent tumor. Stability can be confirmed on follow-up. Otherwise, intracranial enhancing lesions consistent with metastatic disease are stable or slightly larger including: Immediately posterior to the right frontal craniotomy site 1 cm enhancing lesion minimally larger (series 10, image 133). Peripheral right frontal lobe 4 x 7 x 3 mm enhancing lesion is stable (series 10, image 126). Subtle enhancement in a gyriform distribution right frontal lobe has progressed since prior exam (series 10, image 109 series 12, image 9). Tiny nodular enhancing lesion medial anterior right occipital lobe suspected rather than vessel (series 10, image 109). Similar appearance of punctate enhancement left frontal lobe (series 10,  image 127). Increase in nodular enhancement posterior left frontal lobe (series 10, image 124). Marked confluent white matter changes periventricular and subcortical region have progressed from the prior examination suggestive of result of treatment of tumor. Global atrophy. Ventricular prominence slightly more notable than on prior exam. It is possible this is related to central atrophy although difficult to exclude superimposed mild hydrocephalus. No obstructing lesion at the level of the aqueduct. Cervical spondylotic changes with protrusions contributing to spinal stenosis C3-4 and C4-5 level with mild cord flattening C4-5 level. Electronically Signed   By: Genia Del M.D.   On: 12/02/2015 12:17    Assessment/Plan  Failure to thrive With lung cancer with metastases and generalized deconditioning. Will have her be followed by hospice care services here  Protein calorie malnutrition To be followed by dietary team. Decline anticipated. Continue MVI. Continue puree diet and to provide assistance with meal. Will add medpass.  NSCLC with brain metastases Monitor clinically. Comfort care is the goal of care at present. Continue norco 5-325 mg 1 tab q6h prn pain. Continue decadron 1 mg bid.   Seizure prophylaxis Continue keppra  GERD Continue protonix 40 mg daily  HTN Continue hctz 12.5 mg daily  Constipation Continue miralax daily as needed  Insomnia Continue zolpidem 5 mg daily as needed     Goals of care: long term care  Labs/tests ordered: none  Family/ staff Communication: reviewed care plan with patient and nursing supervisor    Blanchie Serve, MD Internal Medicine [place], [place] [address] Cell Phone (Monday-Friday 8 am - 5 pm): [phone] On Call: [phone] and follow prompts after 5 pm and on weekends Office Phone: [phone] Office Fax: [phone]

## 2016-01-12 ENCOUNTER — Ambulatory Visit: Payer: Medicare Other | Admitting: Internal Medicine

## 2016-01-17 ENCOUNTER — Other Ambulatory Visit: Payer: Self-pay

## 2016-01-17 DIAGNOSIS — C3492 Malignant neoplasm of unspecified part of left bronchus or lung: Secondary | ICD-10-CM

## 2016-01-17 DIAGNOSIS — C7951 Secondary malignant neoplasm of bone: Secondary | ICD-10-CM

## 2016-01-17 MED ORDER — HYDROCODONE-ACETAMINOPHEN 5-325 MG PO TABS: ORAL_TABLET | ORAL | Status: AC

## 2016-01-21 ENCOUNTER — Non-Acute Institutional Stay (SKILLED_NURSING_FACILITY): Payer: Medicare Other | Admitting: Family

## 2016-01-21 ENCOUNTER — Encounter: Payer: Self-pay | Admitting: Family

## 2016-01-21 DIAGNOSIS — K5909 Other constipation: Secondary | ICD-10-CM | POA: Diagnosis not present

## 2016-01-21 DIAGNOSIS — C3492 Malignant neoplasm of unspecified part of left bronchus or lung: Secondary | ICD-10-CM

## 2016-01-21 DIAGNOSIS — I10 Essential (primary) hypertension: Secondary | ICD-10-CM | POA: Diagnosis not present

## 2016-01-21 DIAGNOSIS — R634 Abnormal weight loss: Secondary | ICD-10-CM

## 2016-01-21 NOTE — Progress Notes (Signed)
Location:  [facility] Room Number: 5427 A Place of Service:  SNF (31) Provider:  Marlowe Sax, FNP-C Mahima Bubba Camp MD  [name], MD  Patient Care Team: Biagio Borg, MD as PCP - General Vaughan Sine as Counselor  Extended Emergency Contact Information Primary Emergency Contact: Lane,Shelby Address: [address]          [place], [place] [address] [name] of [place] Phone: [phone] Mobile Phone: [phone] Relation: Relative Secondary Emergency Contact: Lane,Preston Address: [address] [place], [place] [address] [name] of [place] Phone: [phone] Relation: Other  Code Status: DNR Goals of care: Advanced Directive information Advanced Directives 01/21/2016  Does patient have an advance directive? Yes  Type of Advance Directive Out of facility DNR (pink MOST or yellow form)  Does patient want to make changes to advanced directive? No - Patient declined  Copy of advanced directive(s) in chart? Yes     Chief Complaint  Patient presents with  . Medical Management of Chronic Issues    Routine Visit    HPI:  Pt is a 75 y.o. female seen today at [facility]  for medical management of chronic diseases.She has a medical history of HTN, Lung CA with Mets, Constipation, weight loss, Hyperlipidemia, among others. She is seen in her room today with her family friend at the bedside. She denies any acute issues. Patient's friend states has noticed increased crusty discharge on patient's eyes every morning despite cleaning. Family also request patient to have a shower instead of a bed bath wonders if bilateral leg wraps can be covered with plastic so that drsg don't get wet. Facility Conservation officer, historic buildings notified.Patient more alert this visit and answers questions appropriately after sometime.       Past Medical History  Diagnosis Date  . Hypertension   . HYPERLIPIDEMIA 11/05/2007  . ANXIETY  05/13/2009  . DEPRESSIVE DISORDER 05/06/2008  . HYPERTENSION 11/05/2007  . ALLERGIC RHINITIS 11/05/2007  . CONSTIPATION, CHRONIC 05/13/2009  . UTI 05/06/2008  . BACK PAIN 05/11/2008  . OSTEOPOROSIS 11/05/2007  . INSOMNIA-SLEEP DISORDER-UNSPEC 05/13/2009  . FATIGUE 05/13/2009  . Swelling, mass, or lump in chest 11/05/2007  . LUNG CANCER, HX OF 05/06/2008  . Neuropathy ([place]) 08/09/2015  . Arthritis   . 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 Lung cancer ([place])   . Brain cancer [facility])    Past Surgical History  Procedure Laterality Date  . Tubal ligation    . S/p lul lung surgury  12/2007  . Lobectomy    . Eye surgery      cataracts removed. IOL , bilateral   . Tonsillectomy    . Appendectomy    . Craniotomy Right 09/02/2015    Procedure: CRANIOTOMY TUMOR EXCISION w/Curve;  Surgeon: Consuella Lose, MD;  Location: [place] NEURO ORS;  Service: Neurosurgery;  Laterality: Right;  Right frontal stereotactic craniotomy for resection of tumor with brainlab  . Application of cranial navigation N/A 09/02/2015    Procedure: APPLICATION OF CRANIAL NAVIGATION;  Surgeon: Consuella Lose, MD;  Location: [facility] [place] NEURO ORS;  Service: Neurosurgery;  Laterality: N/A;  APPLICATION OF CRANIAL NAVIGATION    Allergies  Allergen Reactions  . Amoxicillin Hives and Itching    Has patient had a PCN reaction causing immediate rash, facial/tongue/throat swelling, SOB or lightheadedness with hypotension: -can't remember Has patient had a  PCN reaction causing severe rash involving mucus membranes or skin necrosis: cant remember Has patient had a PCN reaction that required hospitalization Yes- ed visit Has patient had a PCN reaction occurring within the last 10 years: No If all of the above answers are "NO", then may proceed with Cephalosporin use.   Mack Hook [Levofloxacin] Nausea And Vomiting  . Lorazepam Other (See Comments)    confusion      Medication List       This list is accurate as of: 01/21/16  8:45  AM.  Always use your most recent med list.               calcium-vitamin D 500-200 MG-UNIT tablet  Take 1 tablet by mouth daily.     dexamethasone 1 MG tablet  Commonly known as:  DECADRON  Take 1 tablet (1 mg total) by mouth 2 (two) times daily.     EQL VITAMIN D3 1000 units tablet  Generic drug:  Cholecalciferol  Take 1,000 Units by mouth daily.     hydrochlorothiazide 12.5 MG capsule  Commonly known as:  MICROZIDE  Take 12.5 mg by mouth daily.     HYDROcodone-acetaminophen 5-325 MG tablet  Commonly known as:  NORCO  1 by mouth every 6 hours as needed for pain DO NOT EXCEED 4GM OF APAP IN 24 HOURS FROM ALL SOURCES     levETIRAcetam 500 MG tablet  Commonly known as:  KEPPRA  Take 1 tablet (500 mg total) by mouth 2 (two) times daily.     MIRALAX powder  Generic drug:  polyethylene glycol powder  Reported on 12/13/2015     multivitamin with minerals Tabs tablet  Take 1 tablet by mouth daily. Women's One A Day     pantoprazole 40 MG tablet  Commonly known as:  PROTONIX  Take 1 tablet (40 mg total) by mouth daily.     prochlorperazine 10 MG tablet  Commonly known as:  COMPAZINE  Take 10 mg by mouth every 6 (six) hours as needed for nausea or vomiting. Reported on 12/13/2015     UNABLE TO FIND  Med Name: Med Pass 67m  three times a day for weight loss     vitamin C 500 MG tablet  Commonly known as:  ASCORBIC ACID  Take 500 mg by mouth daily.     zolpidem 5 MG tablet  Commonly known as:  AMBIEN  Take 1 tablet (5 mg total) by mouth at bedtime as needed for sleep.        Review of Systems  Constitutional: Negative for fever, chills, activity change, appetite change and fatigue.  HENT: Negative for congestion, rhinorrhea, sinus pressure, sneezing and sore throat.   Respiratory: Negative for chest tightness, shortness of breath and wheezing.   Cardiovascular: Negative for chest pain and palpitations.  Gastrointestinal: Negative.   Genitourinary: Negative.     Musculoskeletal: Positive for gait problem.  Skin: Negative for color change, pallor and rash.       Leg wound managed by wound Nurse. Drsg changed prior to visit. Wound Nurse states progressive healing.   Neurological: Negative for dizziness, seizures and headaches.  Psychiatric/Behavioral: Negative for hallucinations, confusion, sleep disturbance and agitation.    Immunization History  Administered Date(s) Administered  . Influenza Split 07/12/2011, 07/17/2012  . Influenza, High Dose Seasonal PF 06/22/2015  . Influenza,inj,Quad PF,36+ Mos 06/25/2013, 07/06/2014  . PPD Test 12/26/2015, 01/09/2016  . Pneumococcal Conjugate-13 08/12/2014  . Pneumococcal Polysaccharide-23 03/18/2007, 08/28/2012  . Zoster  03/18/2007   Pertinent  Health Maintenance Due  Topic Date Due  . INFLUENZA VACCINE  05/02/2016  . MAMMOGRAM  11/25/2016  . COLONOSCOPY  05/01/2017  . DEXA SCAN  Completed  . PNA vac Low Risk Adult  Completed   Fall Risk  08/09/2015 10/13/2014 09/07/2014 09/01/2014 08/12/2014  Falls in the past year? No No No No No   Functional Status Survey:    Filed Vitals:   01/21/16 0821  BP: 106/78  Temp: 98.9 F (37.2 C)  Resp: 18  Height: '5\' 5"'$  (1.651 m)  Weight: 113 lb 12.8 oz (51.619 kg)  SpO2: 97%   Body mass index is 18.94 kg/(m^2). Physical Exam  Constitutional:  Frail Elderly in no acute distress. More Alert and communicates this visit.    HENT:  Head: Normocephalic.  Mouth/Throat: Oropharynx is clear and moist.  Eyes: Conjunctivae and EOM are normal. Pupils are equal, round, and reactive to light. No scleral icterus.   Both eyes yellow thick discharge noted.   Neck: Normal range of motion. No JVD present.  Cardiovascular: Normal rate, regular rhythm, normal heart sounds and intact distal pulses.  Exam reveals no gallop and no friction rub.   No murmur heard. Pulmonary/Chest: Effort normal and breath sounds normal. No respiratory distress. She has no wheezes. She has no  rales.  Abdominal: Soft. Bowel sounds are normal. She exhibits no distension. There is no tenderness. There is no rebound and no guarding.  Genitourinary:  Incontinent   Musculoskeletal: She exhibits no tenderness.  Bilateral LE's Wraps changed by wound Nurse prior to visit.   Lymphadenopathy:    She has no cervical adenopathy.  Neurological: She is alert.  Skin: Skin is warm and dry. No rash noted. No erythema. No pallor.  Wound managed by wound nurse progressive healing.   Psychiatric: She has a normal mood and affect.    Labs reviewed:  Recent Labs  10/28/15 1634 11/01/15 0925 11/14/15 2013 12/09/15 1420 12/13/15 12/13/15 0957  NA 132* 136 134* 138 135* 135*  K 3.8 3.6 3.2* 3.6 3.2* 3.2*  CL 98*  --  93* 95*  --   --   CO2 27 23  --  28  --  29  GLUCOSE 97 136 102* 85  --  104  BUN 15 14.2 22* 16 13 12.8  CREATININE 0.69 0.9 0.80 0.86 0.8 0.8  CALCIUM 9.0 9.0  --  9.5  --  9.6    Recent Labs  11/01/15 0925 12/09/15 1420 12/13/15 12/13/15 0957  AST 16 39 34 34  ALT '24 30 29 29  '$ ALKPHOS 77 96 106 106  BILITOT 0.55 0.8  --  0.72  PROT 7.0 6.0*  --  6.9  ALBUMIN 3.5 3.4*  --  3.5    Recent Labs  10/28/15 1634 11/01/15 0925  12/09/15 1420 12/13/15 12/13/15 0957  WBC 6.6 6.3  --  9.8 8.4 8.4  NEUTROABS 5.5 5.6  --   --   --  6.5  HGB 13.5 14.9  < > 15.7* 16.3* 16.3*  HCT 38.8 44.0  < > 47.0* 48* 48.5*  MCV 96.3 96.8  --  97.3  --  96.3  PLT 220 270  --  288 308 308  < > = values in this interval not displayed. Lab Results  Component Value Date   TSH 0.70 08/12/2014   Lab Results  Component Value Date   HGBA1C  10/30/2007    5.4 (NOTE)   The ADA  recommends the following therapeutic goals for glycemic   control related to Hgb A1C measurement:   Goal of Therapy:   < 7.0% Hgb A1C   Action Suggested:  > 8.0% Hgb A1C   Ref:  Diabetes Care, 22, Suppl. 1, 1999   Lab Results  Component Value Date   CHOL 218* 08/12/2014   HDL 69.80 08/12/2014   LDLCALC 127*  08/12/2014   LDLDIRECT 137.9 06/25/2013   TRIG 104.0 08/12/2014   CHOLHDL 3 08/12/2014    Significant Diagnostic Results in last 30 days:  No results found.  Assessment/Plan HTN B/p Stable. Continue HCTZ 12.5 mg Tablet. Continue to monitor BMP  Non-Small Cell Cancer of Left lung  No shortness of breath or wheezing. Continue with Hospice services. Comfort care.   Constipation  LBM one day ago. Continue Miralax. Encourage oral intake.   Weight loss  Has had 7 lbs weight loss over one month. Continue on protein supplements. Continue on Hospice service    Family/ staff Communication: Reviewed plan of care with patient and facility Nurse Supervisor.  Labs/tests ordered:None

## 2016-01-26 ENCOUNTER — Encounter: Payer: Self-pay | Admitting: Internal Medicine

## 2016-01-26 ENCOUNTER — Non-Acute Institutional Stay (SKILLED_NURSING_FACILITY): Payer: Medicare Other | Admitting: Internal Medicine

## 2016-01-26 DIAGNOSIS — E43 Unspecified severe protein-calorie malnutrition: Secondary | ICD-10-CM | POA: Diagnosis not present

## 2016-01-26 DIAGNOSIS — R0600 Dyspnea, unspecified: Secondary | ICD-10-CM | POA: Diagnosis not present

## 2016-01-26 DIAGNOSIS — R627 Adult failure to thrive: Secondary | ICD-10-CM

## 2016-01-26 DIAGNOSIS — J690 Pneumonitis due to inhalation of food and vomit: Secondary | ICD-10-CM | POA: Diagnosis not present

## 2016-01-26 DIAGNOSIS — K117 Disturbances of salivary secretion: Secondary | ICD-10-CM

## 2016-01-26 NOTE — Progress Notes (Signed)
LOCATION: [facility]  PCP: [name], MD   Code Status: DNR  Goals of care: Advanced Directive information Advanced Directives 01/21/2016  Does patient have an advance directive? Yes  Type of Advance Directive Out of facility DNR (pink MOST or yellow form)  Does patient want to make changes to advanced directive? No - Patient declined  Copy of advanced directive(s) in chart? Yes       Extended Emergency Contact Information Primary Emergency Contact: Lane,Shelby Address: [place], [place] [address] [name] of [place] Phone: [phone] Mobile Phone: [phone] Relation: Relative Secondary Emergency Contact: Lane,Preston Address: [address] [place], [place] [address] [name] of [place] Phone: [phone] Relation: Other   Allergies  Allergen Reactions  . Amoxicillin Hives and Itching    Has patient had a PCN reaction causing immediate rash, facial/tongue/throat swelling, SOB or lightheadedness with hypotension: -can't remember Has patient had a PCN reaction causing severe rash involving mucus membranes or skin necrosis: cant remember Has patient had a PCN reaction that required hospitalization Yes- ed visit Has patient had a PCN reaction occurring within the last 10 years: No If all of the above answers are "NO", then may proceed with Cephalosporin use.   [place] [Levofloxacin] Nausea And Vomiting  . Lorazepam Other (See Comments)    confusion    Chief Complaint  Patient presents with  . Acute Visit    Poor oral intake. Ongoing decline. increased secretions     HPI:  Patient is a 75 y.o. female seen today for acute visit. She is under hospice services. She has been having ongoing decline and has now had poor po intake. She has hardly eaten anything for days. Per staff, she has been having increased secretions and has required frequent suctioning. She has medical history of adenocarcinoma of lung with  brain metastases, HTN among others. She is not participating in HPI and ROS. Per staff, she has not been taking her pills.   Review of Systems: Unable to obtain.    Past Medical History  Diagnosis Date  . Hypertension   . HYPERLIPIDEMIA 11/05/2007  . ANXIETY 05/13/2009  . DEPRESSIVE DISORDER 05/06/2008  . HYPERTENSION 11/05/2007  . ALLERGIC RHINITIS 11/05/2007  . CONSTIPATION, CHRONIC 05/13/2009  . UTI 05/06/2008  . BACK PAIN 05/11/2008  . OSTEOPOROSIS 11/05/2007  . INSOMNIA-SLEEP DISORDER-UNSPEC 05/13/2009  . FATIGUE 05/13/2009  . Swelling, mass, or lump in chest 11/05/2007  . LUNG CANCER, HX OF 05/06/2008  . Neuropathy ([place]) 08/09/2015  . Arthritis   . 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 Lung cancer ([place])   . Brain cancer [facility])     Medications:   Medication List       This list is accurate as of: 01/26/16  1:27 PM.  Always use your most recent med list.               calcium-vitamin D 500-200 MG-UNIT tablet  Take 1 tablet by mouth daily.     dexamethasone 1 MG tablet  Commonly known as:  DECADRON  Take 1 tablet (1 mg total) by mouth 2 (two) times daily.     EQL VITAMIN D3 1000 units tablet  Generic drug:  Cholecalciferol  Take 1,000 Units by mouth daily.     gentamicin 0.3 % ophthalmic solution  Commonly  known as:  GARAMYCIN  Place 1 drop into both eyes every 8 (eight) hours. Stop date 01/27/16     hydrochlorothiazide 12.5 MG capsule  Commonly known as:  MICROZIDE  Take 12.5 mg by mouth daily.     HYDROcodone-acetaminophen 5-325 MG tablet  Commonly known as:  NORCO  1 by mouth every 6 hours as needed for pain DO NOT EXCEED 4GM OF APAP IN 24 HOURS FROM ALL SOURCES     levETIRAcetam 500 MG tablet  Commonly known as:  KEPPRA  Take 1 tablet (500 mg total) by mouth 2 (two) times daily.     MIRALAX powder  Generic drug:  polyethylene glycol powder  Take one capful every 24 hour as needed     morphine 20 MG/ML concentrated solution  Commonly known as:   ROXANOL  Take 5 mg by mouth every 6 (six) hours as needed for moderate pain or shortness of breath.     multivitamin with minerals Tabs tablet  Take 1 tablet by mouth daily. Women's One A Day     pantoprazole 40 MG tablet  Commonly known as:  PROTONIX  Take 1 tablet (40 mg total) by mouth daily.     polyvinyl alcohol 1.4 % ophthalmic solution  Commonly known as:  LIQUIFILM TEARS  Place 1 drop into both eyes every 8 (eight) hours.     prochlorperazine 10 MG tablet  Commonly known as:  COMPAZINE  Take 10 mg by mouth every 6 (six) hours as needed for nausea or vomiting. Reported on 12/13/2015     UNABLE TO FIND  Med Name: Med Pass 67m  three times a day for weight loss     UNABLE TO FIND  Med Name: Atropine 1%. 2 drops sublingual every 6 hours for dry secretions.     vitamin C 500 MG tablet  Commonly known as:  ASCORBIC ACID  Take 500 mg by mouth daily.     zolpidem 5 MG tablet  Commonly known as:  AMBIEN  Take 1 tablet (5 mg total) by mouth at bedtime as needed for sleep.        Physical Exam: Filed Vitals:   01/26/16 0959  BP: 139/96  Pulse: 79  Temp: 98.7 F (37.1 C)  TempSrc: Oral  Resp: 19  Height: '5\' 5"'$  (1.651 m)  Weight: 113 lb 6.4 oz (51.438 kg)  SpO2: 98%    General- elderly female, frail and thin built, in no acute distress Head- normocephalic, atraumatic Nose- no nasal discharge Throat- dry mucus membrane, secretions noted Eyes- no pallor, no icterus, no discharge Cardiovascular- normal s1,s2, no murmur, has leg edema Respiratory- poor air movement with rhonchi + Abdomen- bowel sounds present, soft, non tender Musculoskeletal- generalized weakness Neurological- unable to assess Skin- warm and dry   Labs reviewed: Basic Metabolic Panel:  Recent Labs  10/28/15 1634 11/01/15 0925 11/14/15 2013 12/09/15 1420 12/13/15 12/13/15 0957  NA 132* 136 134* 138 135* 135*  K 3.8 3.6 3.2* 3.6 3.2* 3.2*  CL 98*  --  93* 95*  --   --   CO2 27 23  --   28  --  29  GLUCOSE 97 136 102* 85  --  104  BUN 15 14.2 22* 16 13 12.8  CREATININE 0.69 0.9 0.80 0.86 0.8 0.8  CALCIUM 9.0 9.0  --  9.5  --  9.6   Liver Function Tests:  Recent Labs  11/01/15 0925 12/09/15 1420 12/13/15 12/13/15 0957  AST 16 39 34  34  ALT '24 30 29 29  '$ ALKPHOS 77 96 106 106  BILITOT 0.55 0.8  --  0.72  PROT 7.0 6.0*  --  6.9  ALBUMIN 3.5 3.4*  --  3.5   No results for input(s): LIPASE, AMYLASE in the last 8760 hours. No results for input(s): AMMONIA in the last 8760 hours. CBC:  Recent Labs  10/28/15 1634 11/01/15 0925  12/09/15 1420 12/13/15 12/13/15 0957  WBC 6.6 6.3  --  9.8 8.4 8.4  NEUTROABS 5.5 5.6  --   --   --  6.5  HGB 13.5 14.9  < > 15.7* 16.3* 16.3*  HCT 38.8 44.0  < > 47.0* 48* 48.5*  MCV 96.3 96.8  --  97.3  --  96.3  PLT 220 270  --  288 308 308  < > = values in this interval not displayed.   CBG:  Recent Labs  08/13/15 0955 10/20/15 1542  GLUCAP 98 100*    Radiological Exams: No results found.  Assessment/Plan  Oral secretions Has increased secretions and has ongoing aspiration. Start atropine 1% drop 2 drops q6h prn for increased secretions and monitor  Dyspnea With her NSCLC with brain mets and likely has aspiration pneumonitis given her exam finding. Start roxanol 5 mg/ 0.25 ml q6h prn for dyspnea and monitor  Aspiration pneumonitis With her failure to thrive and comfort care being the main goal of care, monitor clinically and continue supportive care for now.  Severe protein calorie malnutrition With poor po intake, decline anticipated. Continue assistance with feeding.  Failure to thrive With lung cancer with metastases and generalized deconditioning. Will have her be followed by hospice care services here. Discontinue norco, glycolax powder, ambien, calcium-vitamin d, ascorbic acid, vitamin d, microzide to reduce pill burden    Goals of care: long term care  Labs/tests ordered: none  Family/ staff  Communication: reviewed care plan with nursing supervisor    Blanchie Serve, MD Internal Medicine [place], [place] [address] Cell Phone (Monday-Friday 8 am - 5 pm): [phone] On Call: [phone] and follow prompts after 5 pm and on weekends Office Phone: [phone] Office Fax: [phone]

## 2016-01-28 ENCOUNTER — Other Ambulatory Visit: Payer: Self-pay

## 2016-01-28 MED ORDER — MORPHINE SULFATE (CONCENTRATE) 20 MG/ML PO SOLN: ORAL | Status: AC

## 2016-01-28 NOTE — Telephone Encounter (Signed)
Rx fax to [name]  Phone: [phone]  Fax : [phone]

## 2016-01-31 DEATH — deceased

## 2016-02-11 ENCOUNTER — Telehealth: Payer: Self-pay | Admitting: Medical Oncology

## 2016-02-11 NOTE — Telephone Encounter (Signed)
Per [facility] , Pt died 02-06-16. Expressed sympathy to [facility].

## 2016-03-21 ENCOUNTER — Other Ambulatory Visit: Payer: Self-pay | Admitting: Nurse Practitioner

## 2016-09-11 IMAGING — MR MR HEAD WO/W CM
10 of 13 series · 35 of 48 positions shown · IV contrast (multihance)
Comparison: MRI head 06/11/2015

CLINICAL DATA: Metastatic lung cancer.  Difficulty finding words.

EXAM:
MRI HEAD WITHOUT AND WITH CONTRAST
TECHNIQUE: Multiplanar, multiecho pulse sequences of the brain and surrounding
structures were obtained without and with intravenous contrast.
CONTRAST:  11mL MULTIHANCE GADOBENATE DIMEGLUMINE 529 MG/ML IV SOLN

[Series 4: T1 · sagittal · 5.0mm · 0.47mm/px · 2 of 24 slices shown]
[im 1/24]
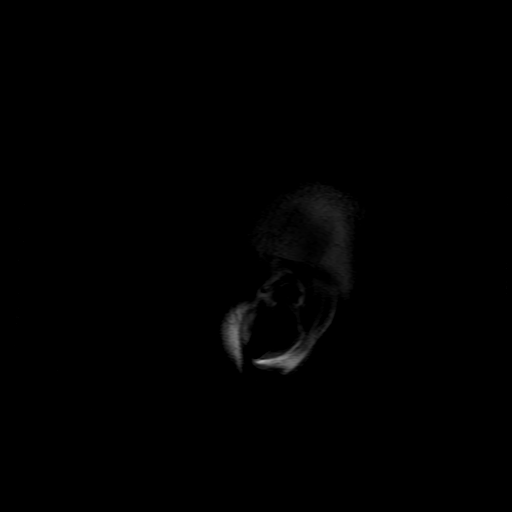
[im 12/24]
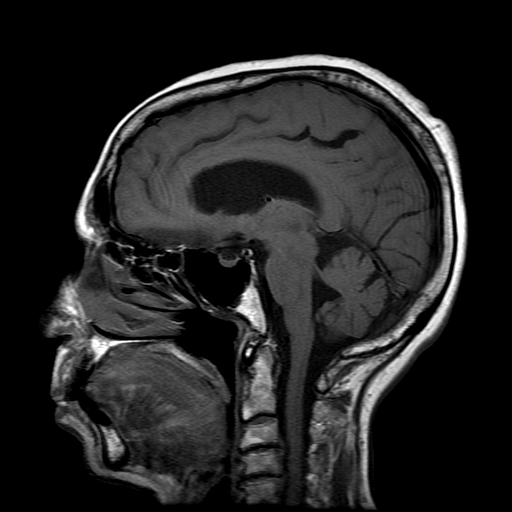

[Series 5: DWI · axial · 3.0mm · 1.09mm/px · z∈[-33,+117]mm · 9 of 102 slices shown (1 of 4)]
[im 1/102]
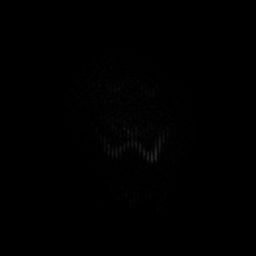
[im 13/102]
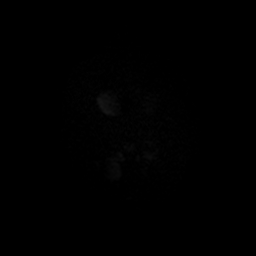
[im 26/102]
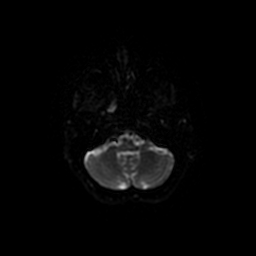
[im 38/102]
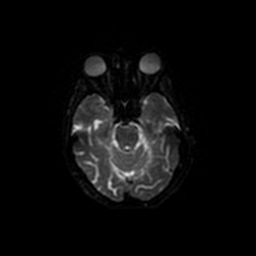
[im 51/102]
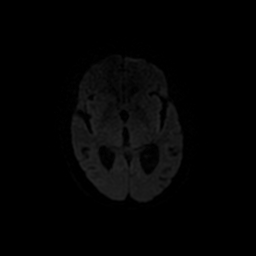
[im 64/102]
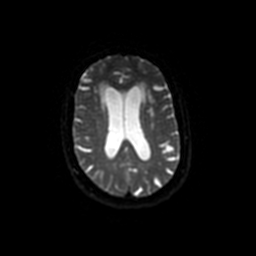
[im 76/102]
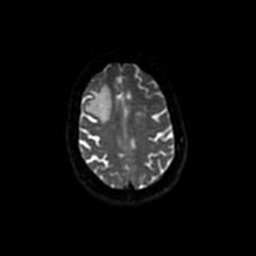
[im 89/102]
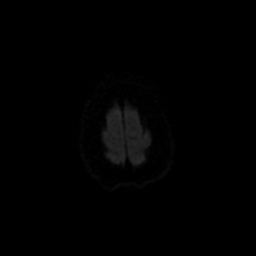
[im 102/102]
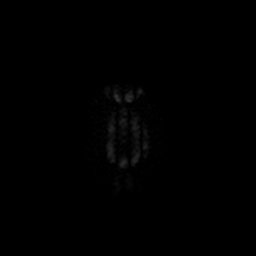

[Series 6: DWI · coronal · 5.0mm · 1.09mm/px · 6 of 66 slices shown (2 of 4)]
[im 1/66]
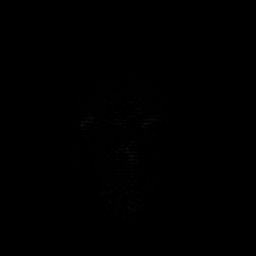
[im 14/66]
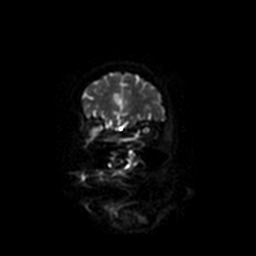
[im 27/66]
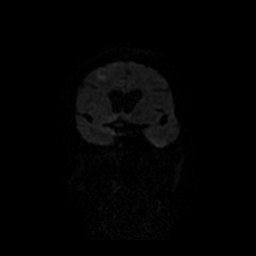
[im 40/66]
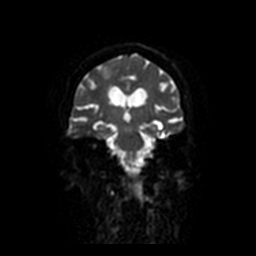
[im 53/66]
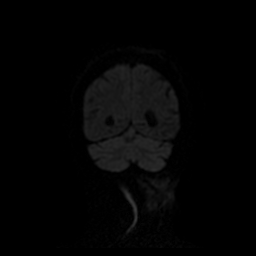
[im 66/66]
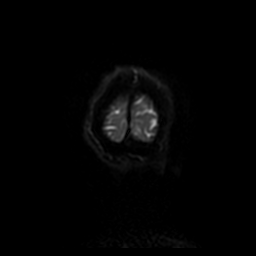

[Series 7: T2 · axial · 5.0mm · 0.43mm/px · z∈[-45,+102]mm · 2 of 24 slices shown]
[im 1/24]
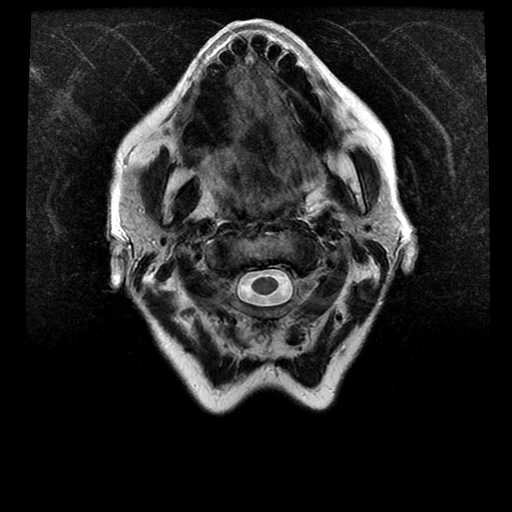
[im 24/24]
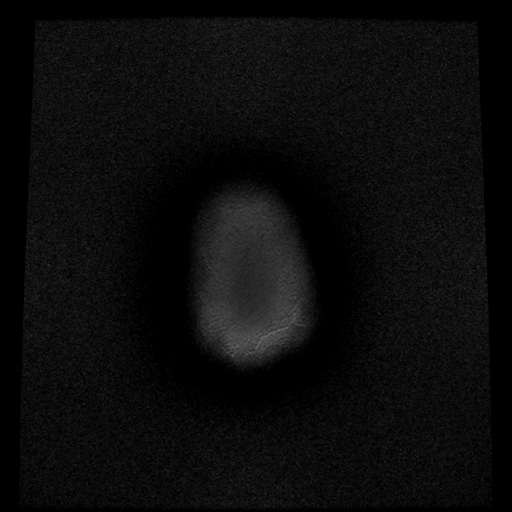

[Series 8: FLAIR · axial · 5.0mm · 0.43mm/px · z∈[-51,+108]mm · 2 of 24 slices shown]
[im 1/24]
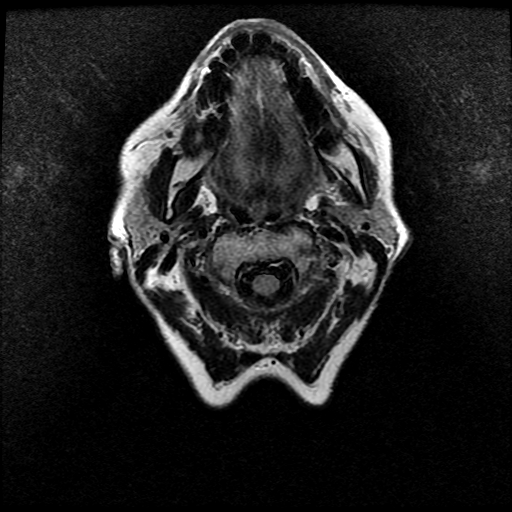
[im 24/24]
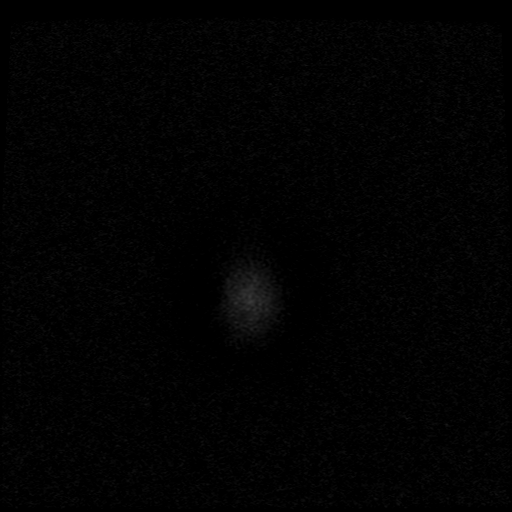

[Series 11: T2 post-contrast · coronal · 5.0mm · 0.45mm/px · 2 of 25 slices shown]
[im 1/25]
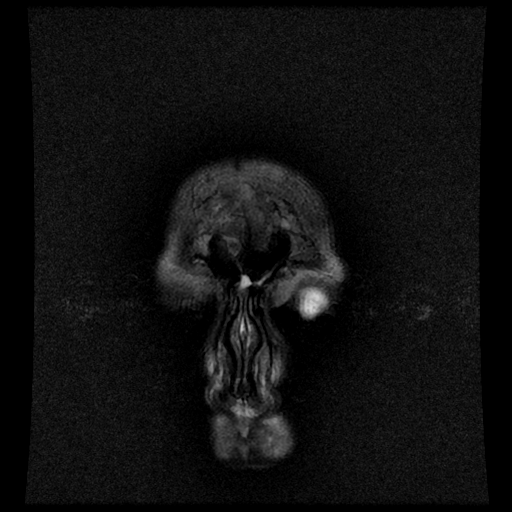
[im 25/25]
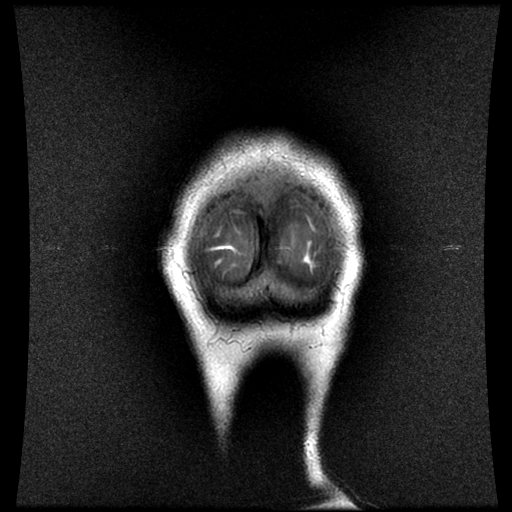

[Series 13: T1 post-contrast · coronal · 5.0mm · 0.45mm/px · 2 of 25 slices shown (1 of 2)]
[im 1/25]
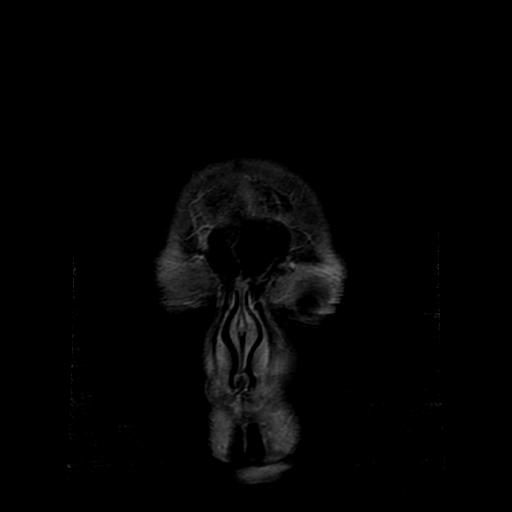
[im 25/25]
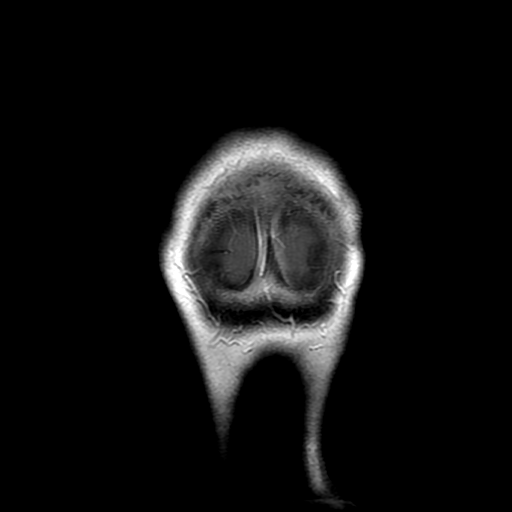

[Series 14: T1 post-contrast · sagittal · 5.0mm · 0.47mm/px · 2 of 24 slices shown (2 of 2)]
[im 1/24]
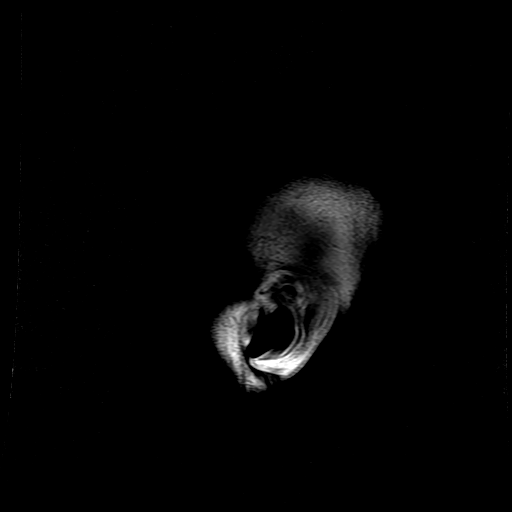
[im 24/24]
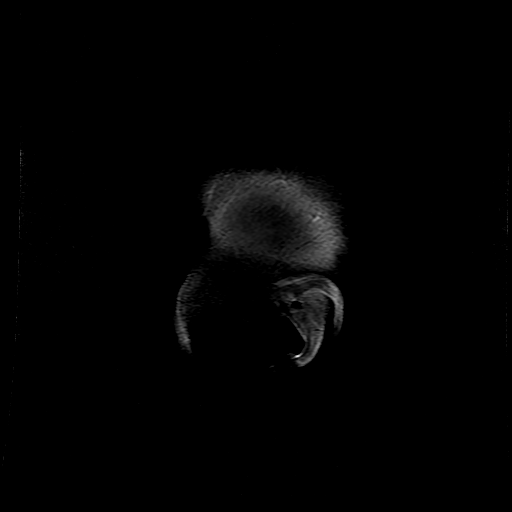

[Series 500: DWI · axial · 3.0mm · 1.09mm/px · z∈[-33,+117]mm · 5 of 51 slices shown (3 of 4)]
[im 1/51]
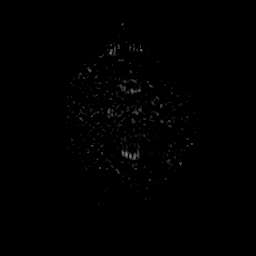
[im 13/51]
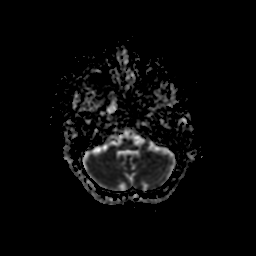
[im 26/51]
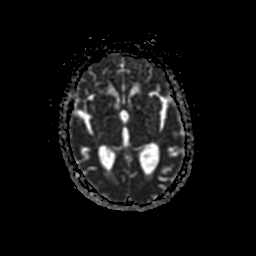
[im 38/51]
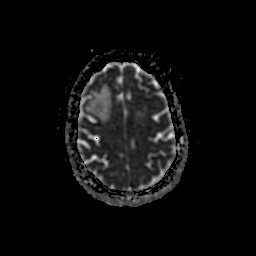
[im 51/51]
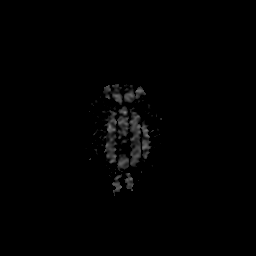

[Series 600: DWI · coronal · 5.0mm · 1.09mm/px · 3 of 33 slices shown (4 of 4)]
[im 1/33]
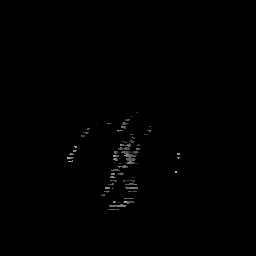
[im 17/33]
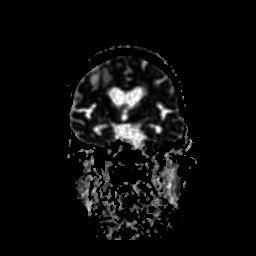
[im 33/33]
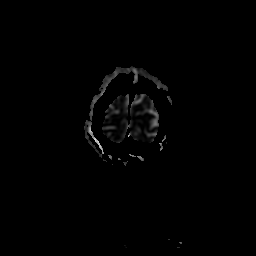

[35 of 48 positions shown; findings below may reference images not displayed]

FINDINGS: Known metastatic disease. Right frontal lobe lesion shows
progressive edema. The enhancing mass in this area also has enlarged
now measuring 13 x 10 mm compared with 9 by 9 mm previously.

4 x 5 mm enhancing nodule right frontal cortex axial image 40 is
unchanged. Right convexity enhancing lesion unchanged 7 mm.
Leptomeningeal enhancement is present in a sulcus above the sylvian
fissure which may be due to tumor spread.

Small right frontal enhancing nodule 2 mm unchanged.

Other small lesions seen previously difficult to identify today. No
new lesions.

Mild ventricular enlargement is unchanged. Review mild
hydrocephalus.

Negative for acute infarct. Chronic microvascular ischemic changes
in the white matter are stable. Negative for intracranial
hemorrhage.
IMPRESSION: Mild progression of tumor in the right frontal lobe with increase in
surrounding white matter edema.

Other lesions are stable to slightly improved.

Ventricular enlargement is unchanged from the prior study but
progressive since 10/27/2014. Mild hydrocephalus is present.
